# Patient Record
Sex: Female | Born: 1970 | Race: White | Hispanic: No | Marital: Married | Smoking: Never smoker
Health system: Southern US, Community
[De-identification: ages and names within clinical notes are randomized; demographics above are authoritative.]

## PROBLEM LIST (undated history)

## (undated) DIAGNOSIS — O210 Mild hyperemesis gravidarum: Secondary | ICD-10-CM

## (undated) DIAGNOSIS — K6389 Other specified diseases of intestine: Secondary | ICD-10-CM

## (undated) DIAGNOSIS — J45909 Unspecified asthma, uncomplicated: Secondary | ICD-10-CM

## (undated) DIAGNOSIS — D649 Anemia, unspecified: Secondary | ICD-10-CM

## (undated) DIAGNOSIS — D229 Melanocytic nevi, unspecified: Secondary | ICD-10-CM

## (undated) DIAGNOSIS — G8929 Other chronic pain: Secondary | ICD-10-CM

## (undated) DIAGNOSIS — R1011 Right upper quadrant pain: Secondary | ICD-10-CM

## (undated) DIAGNOSIS — Z87442 Personal history of urinary calculi: Secondary | ICD-10-CM

## (undated) HISTORY — DX: Melanocytic nevi, unspecified: D22.9

## (undated) HISTORY — DX: Right upper quadrant pain: R10.11

## (undated) HISTORY — PX: KIDNEY STONE SURGERY: SHX686

## (undated) HISTORY — DX: Mild hyperemesis gravidarum: O21.0

## (undated) HISTORY — DX: Other specified diseases of intestine: K63.89

## (undated) HISTORY — DX: Other chronic pain: G89.29

## (undated) HISTORY — PX: ABDOMINAL HYSTERECTOMY: SHX81

## (undated) HISTORY — PX: TONSILECTOMY, ADENOIDECTOMY, BILATERAL MYRINGOTOMY AND TUBES: SHX2538

## (undated) HISTORY — DX: Unspecified asthma, uncomplicated: J45.909

---

## 1994-06-12 HISTORY — PX: CHOLECYSTECTOMY: SHX55

## 1997-04-13 DIAGNOSIS — O210 Mild hyperemesis gravidarum: Secondary | ICD-10-CM

## 1997-04-13 HISTORY — DX: Mild hyperemesis gravidarum: O21.0

## 1998-06-24 ENCOUNTER — Other Ambulatory Visit: Admission: RE | Admit: 1998-06-24 | Discharge: 1998-06-24 | Payer: Self-pay

## 2001-06-20 ENCOUNTER — Other Ambulatory Visit: Admission: RE | Admit: 2001-06-20 | Discharge: 2001-06-20 | Payer: Self-pay | Admitting: Family Medicine

## 2001-07-09 ENCOUNTER — Ambulatory Visit (HOSPITAL_COMMUNITY): Admission: RE | Admit: 2001-07-09 | Discharge: 2001-07-09 | Payer: Self-pay | Admitting: Urology

## 2001-07-09 ENCOUNTER — Encounter: Payer: Self-pay | Admitting: Urology

## 2002-07-04 ENCOUNTER — Other Ambulatory Visit: Admission: RE | Admit: 2002-07-04 | Discharge: 2002-07-04 | Payer: Self-pay | Admitting: Family Medicine

## 2003-07-10 ENCOUNTER — Other Ambulatory Visit: Admission: RE | Admit: 2003-07-10 | Discharge: 2003-07-10 | Payer: Self-pay | Admitting: Family Medicine

## 2004-03-04 ENCOUNTER — Ambulatory Visit: Payer: Self-pay | Admitting: Family Medicine

## 2004-07-15 ENCOUNTER — Other Ambulatory Visit: Admission: RE | Admit: 2004-07-15 | Discharge: 2004-07-15 | Payer: Self-pay | Admitting: Family Medicine

## 2004-07-15 ENCOUNTER — Ambulatory Visit: Payer: Self-pay | Admitting: Family Medicine

## 2005-07-07 ENCOUNTER — Ambulatory Visit: Payer: Self-pay | Admitting: Family Medicine

## 2005-07-12 ENCOUNTER — Encounter: Payer: Self-pay | Admitting: Family Medicine

## 2005-07-12 LAB — CONVERTED CEMR LAB

## 2005-07-21 ENCOUNTER — Ambulatory Visit: Payer: Self-pay | Admitting: Family Medicine

## 2005-07-21 ENCOUNTER — Other Ambulatory Visit: Admission: RE | Admit: 2005-07-21 | Discharge: 2005-07-21 | Payer: Self-pay | Admitting: Family Medicine

## 2005-07-21 ENCOUNTER — Encounter: Payer: Self-pay | Admitting: Family Medicine

## 2006-02-17 ENCOUNTER — Ambulatory Visit: Payer: Self-pay | Admitting: Family Medicine

## 2006-02-19 ENCOUNTER — Ambulatory Visit: Payer: Self-pay | Admitting: Urology

## 2006-03-08 ENCOUNTER — Ambulatory Visit: Payer: Self-pay | Admitting: Urology

## 2006-03-13 HISTORY — PX: LITHOTRIPSY: SUR834

## 2006-03-25 ENCOUNTER — Ambulatory Visit: Payer: Self-pay | Admitting: Urology

## 2006-05-11 ENCOUNTER — Ambulatory Visit: Payer: Self-pay | Admitting: Family Medicine

## 2006-08-20 ENCOUNTER — Encounter: Payer: Self-pay | Admitting: Family Medicine

## 2006-08-20 DIAGNOSIS — D239 Other benign neoplasm of skin, unspecified: Secondary | ICD-10-CM | POA: Insufficient documentation

## 2006-11-30 ENCOUNTER — Ambulatory Visit: Payer: Self-pay | Admitting: Family Medicine

## 2006-11-30 ENCOUNTER — Encounter: Payer: Self-pay | Admitting: Family Medicine

## 2006-11-30 ENCOUNTER — Other Ambulatory Visit: Admission: RE | Admit: 2006-11-30 | Discharge: 2006-11-30 | Payer: Self-pay | Admitting: Family Medicine

## 2006-12-05 ENCOUNTER — Encounter (INDEPENDENT_AMBULATORY_CARE_PROVIDER_SITE_OTHER): Payer: Self-pay | Admitting: *Deleted

## 2006-12-05 LAB — CONVERTED CEMR LAB: Pap Smear: NORMAL

## 2006-12-06 ENCOUNTER — Encounter (INDEPENDENT_AMBULATORY_CARE_PROVIDER_SITE_OTHER): Payer: Self-pay | Admitting: *Deleted

## 2007-02-05 DIAGNOSIS — J019 Acute sinusitis, unspecified: Secondary | ICD-10-CM | POA: Insufficient documentation

## 2007-02-24 ENCOUNTER — Ambulatory Visit: Payer: Self-pay | Admitting: Internal Medicine

## 2007-02-28 ENCOUNTER — Telehealth: Payer: Self-pay | Admitting: Family Medicine

## 2007-03-17 ENCOUNTER — Telehealth (INDEPENDENT_AMBULATORY_CARE_PROVIDER_SITE_OTHER): Payer: Self-pay | Admitting: *Deleted

## 2007-04-05 ENCOUNTER — Ambulatory Visit: Payer: Self-pay | Admitting: Family Medicine

## 2007-04-05 DIAGNOSIS — N760 Acute vaginitis: Secondary | ICD-10-CM | POA: Insufficient documentation

## 2007-04-05 DIAGNOSIS — B9689 Other specified bacterial agents as the cause of diseases classified elsewhere: Secondary | ICD-10-CM | POA: Insufficient documentation

## 2007-05-16 ENCOUNTER — Encounter: Payer: Self-pay | Admitting: Family Medicine

## 2007-06-02 ENCOUNTER — Ambulatory Visit: Payer: Self-pay | Admitting: Internal Medicine

## 2007-09-13 ENCOUNTER — Ambulatory Visit: Payer: Self-pay | Admitting: Family Medicine

## 2007-09-13 DIAGNOSIS — R519 Headache, unspecified: Secondary | ICD-10-CM | POA: Insufficient documentation

## 2007-09-13 DIAGNOSIS — R4589 Other symptoms and signs involving emotional state: Secondary | ICD-10-CM | POA: Insufficient documentation

## 2007-09-13 DIAGNOSIS — R51 Headache: Secondary | ICD-10-CM | POA: Insufficient documentation

## 2007-09-13 DIAGNOSIS — D485 Neoplasm of uncertain behavior of skin: Secondary | ICD-10-CM | POA: Insufficient documentation

## 2007-10-20 ENCOUNTER — Encounter: Payer: Self-pay | Admitting: Family Medicine

## 2007-11-04 ENCOUNTER — Ambulatory Visit: Payer: Self-pay | Admitting: Family Medicine

## 2007-11-04 LAB — CONVERTED CEMR LAB: Rapid Strep: NEGATIVE

## 2007-12-08 ENCOUNTER — Other Ambulatory Visit: Admission: RE | Admit: 2007-12-08 | Discharge: 2007-12-08 | Payer: Self-pay | Admitting: Family Medicine

## 2007-12-08 ENCOUNTER — Ambulatory Visit: Payer: Self-pay | Admitting: Family Medicine

## 2007-12-08 ENCOUNTER — Encounter: Payer: Self-pay | Admitting: Family Medicine

## 2007-12-12 LAB — CONVERTED CEMR LAB
ALT: 16 units/L (ref 0–35)
AST: 25 units/L (ref 0–37)
Albumin: 4.2 g/dL (ref 3.5–5.2)
Alkaline Phosphatase: 54 units/L (ref 39–117)
BUN: 10 mg/dL (ref 6–23)
Basophils Absolute: 0 10*3/uL (ref 0.0–0.1)
Basophils Relative: 0.5 % (ref 0.0–3.0)
Bilirubin, Direct: 0.1 mg/dL (ref 0.0–0.3)
CO2: 30 meq/L (ref 19–32)
Calcium: 9.6 mg/dL (ref 8.4–10.5)
Chloride: 107 meq/L (ref 96–112)
Cholesterol: 137 mg/dL (ref 0–200)
Creatinine, Ser: 0.8 mg/dL (ref 0.4–1.2)
Eosinophils Absolute: 0.1 10*3/uL (ref 0.0–0.7)
Eosinophils Relative: 1.9 % (ref 0.0–5.0)
GFR calc Af Amer: 106 mL/min
GFR calc non Af Amer: 87 mL/min
Glucose, Bld: 68 mg/dL — ABNORMAL LOW (ref 70–99)
HCT: 38.4 % (ref 36.0–46.0)
HDL: 31.5 mg/dL — ABNORMAL LOW (ref >39.0)
Hemoglobin: 13.5 g/dL (ref 12.0–15.0)
LDL Cholesterol: 90 mg/dL (ref 0–99)
Lymphocytes Relative: 31 % (ref 12.0–46.0)
MCHC: 35.3 g/dL (ref 30.0–36.0)
MCV: 88.7 fL (ref 78.0–100.0)
Monocytes Absolute: 0.5 10*3/uL (ref 0.1–1.0)
Monocytes Relative: 6.9 % (ref 3.0–12.0)
Neutro Abs: 4.2 10*3/uL (ref 1.4–7.7)
Neutrophils Relative %: 59.7 % (ref 43.0–77.0)
Platelets: 255 10*3/uL (ref 150–400)
Potassium: 4.4 meq/L (ref 3.5–5.1)
RBC: 4.33 M/uL (ref 3.87–5.11)
RDW: 13.5 % (ref 11.5–14.6)
Sodium: 142 meq/L (ref 135–145)
TSH: 0.75 microintl units/mL (ref 0.35–5.50)
Total Bilirubin: 0.9 mg/dL (ref 0.3–1.2)
Total CHOL/HDL Ratio: 4.3
Total Protein: 7.2 g/dL (ref 6.0–8.3)
Triglycerides: 76 mg/dL (ref 0–149)
VLDL: 15 mg/dL (ref 0–40)
WBC: 6.9 10*3/uL (ref 4.5–10.5)

## 2008-05-31 ENCOUNTER — Ambulatory Visit: Payer: Self-pay | Admitting: Family Medicine

## 2008-05-31 ENCOUNTER — Encounter (INDEPENDENT_AMBULATORY_CARE_PROVIDER_SITE_OTHER): Payer: Self-pay | Admitting: Internal Medicine

## 2008-05-31 DIAGNOSIS — R9431 Abnormal electrocardiogram [ECG] [EKG]: Secondary | ICD-10-CM | POA: Insufficient documentation

## 2008-05-31 DIAGNOSIS — N644 Mastodynia: Secondary | ICD-10-CM | POA: Insufficient documentation

## 2008-06-07 ENCOUNTER — Ambulatory Visit: Payer: Self-pay | Admitting: Family Medicine

## 2008-07-18 ENCOUNTER — Ambulatory Visit: Payer: Self-pay | Admitting: Family Medicine

## 2008-07-18 DIAGNOSIS — H60399 Other infective otitis externa, unspecified ear: Secondary | ICD-10-CM | POA: Insufficient documentation

## 2008-07-18 DIAGNOSIS — R0789 Other chest pain: Secondary | ICD-10-CM | POA: Insufficient documentation

## 2008-07-30 ENCOUNTER — Ambulatory Visit: Payer: Self-pay | Admitting: Cardiology

## 2008-08-13 ENCOUNTER — Ambulatory Visit: Payer: Self-pay | Admitting: Cardiology

## 2008-12-07 ENCOUNTER — Encounter: Payer: Self-pay | Admitting: Family Medicine

## 2008-12-07 ENCOUNTER — Other Ambulatory Visit: Admission: RE | Admit: 2008-12-07 | Discharge: 2008-12-07 | Payer: Self-pay | Admitting: Family Medicine

## 2008-12-07 ENCOUNTER — Ambulatory Visit: Payer: Self-pay | Admitting: Family Medicine

## 2008-12-12 ENCOUNTER — Encounter (INDEPENDENT_AMBULATORY_CARE_PROVIDER_SITE_OTHER): Payer: Self-pay | Admitting: *Deleted

## 2009-01-07 ENCOUNTER — Encounter: Payer: Self-pay | Admitting: Family Medicine

## 2009-01-07 ENCOUNTER — Ambulatory Visit: Payer: Self-pay | Admitting: Family Medicine

## 2009-01-14 DIAGNOSIS — R928 Other abnormal and inconclusive findings on diagnostic imaging of breast: Secondary | ICD-10-CM | POA: Insufficient documentation

## 2009-01-22 ENCOUNTER — Encounter: Payer: Self-pay | Admitting: Family Medicine

## 2009-01-22 ENCOUNTER — Ambulatory Visit: Payer: Self-pay | Admitting: Family Medicine

## 2009-01-28 ENCOUNTER — Encounter (INDEPENDENT_AMBULATORY_CARE_PROVIDER_SITE_OTHER): Payer: Self-pay | Admitting: *Deleted

## 2009-04-01 ENCOUNTER — Telehealth: Payer: Self-pay | Admitting: Family Medicine

## 2009-04-01 ENCOUNTER — Ambulatory Visit: Payer: Self-pay | Admitting: Family Medicine

## 2009-05-01 ENCOUNTER — Ambulatory Visit: Payer: Self-pay | Admitting: Family Medicine

## 2009-05-01 DIAGNOSIS — J069 Acute upper respiratory infection, unspecified: Secondary | ICD-10-CM | POA: Insufficient documentation

## 2009-05-01 LAB — CONVERTED CEMR LAB: Rapid Strep: NEGATIVE

## 2009-11-25 ENCOUNTER — Telehealth: Payer: Self-pay | Admitting: Family Medicine

## 2009-12-09 ENCOUNTER — Telehealth: Payer: Self-pay | Admitting: Family Medicine

## 2009-12-12 ENCOUNTER — Telehealth (INDEPENDENT_AMBULATORY_CARE_PROVIDER_SITE_OTHER): Payer: Self-pay | Admitting: *Deleted

## 2009-12-17 ENCOUNTER — Ambulatory Visit: Payer: Self-pay | Admitting: Family Medicine

## 2009-12-17 LAB — CONVERTED CEMR LAB
ALT: 29 units/L (ref 0–35)
AST: 30 units/L (ref 0–37)
Albumin: 4.1 g/dL (ref 3.5–5.2)
Alkaline Phosphatase: 64 units/L (ref 39–117)
BUN: 11 mg/dL (ref 6–23)
Basophils Absolute: 0 10*3/uL (ref 0.0–0.1)
Basophils Relative: 0.6 % (ref 0.0–3.0)
Bilirubin, Direct: 0.2 mg/dL (ref 0.0–0.3)
CO2: 27 meq/L (ref 19–32)
Calcium: 9.1 mg/dL (ref 8.4–10.5)
Chloride: 108 meq/L (ref 96–112)
Cholesterol: 146 mg/dL (ref 0–200)
Creatinine, Ser: 0.8 mg/dL (ref 0.4–1.2)
Eosinophils Absolute: 0.1 10*3/uL (ref 0.0–0.7)
Eosinophils Relative: 1.3 % (ref 0.0–5.0)
GFR calc non Af Amer: 91.15 mL/min (ref >60)
Glucose, Bld: 83 mg/dL (ref 70–99)
HCT: 36.1 % (ref 36.0–46.0)
HDL: 30.4 mg/dL — ABNORMAL LOW (ref >39.00)
Hemoglobin: 12.1 g/dL (ref 12.0–15.0)
LDL Cholesterol: 105 mg/dL — ABNORMAL HIGH (ref 0–99)
Lymphocytes Relative: 32.9 % (ref 12.0–46.0)
Lymphs Abs: 2.1 10*3/uL (ref 0.7–4.0)
MCHC: 33.7 g/dL (ref 30.0–36.0)
MCV: 85.4 fL (ref 78.0–100.0)
Monocytes Absolute: 0.5 10*3/uL (ref 0.1–1.0)
Monocytes Relative: 7.8 % (ref 3.0–12.0)
Neutro Abs: 3.6 10*3/uL (ref 1.4–7.7)
Neutrophils Relative %: 57.4 % (ref 43.0–77.0)
Platelets: 252 10*3/uL (ref 150.0–400.0)
Potassium: 4.3 meq/L (ref 3.5–5.1)
RBC: 4.23 M/uL (ref 3.87–5.11)
RDW: 15.3 % — ABNORMAL HIGH (ref 11.5–14.6)
Sodium: 141 meq/L (ref 135–145)
TSH: 1.09 microintl units/mL (ref 0.35–5.50)
Total Bilirubin: 1.1 mg/dL (ref 0.3–1.2)
Total CHOL/HDL Ratio: 5
Total Protein: 6.9 g/dL (ref 6.0–8.3)
Triglycerides: 53 mg/dL (ref 0.0–149.0)
VLDL: 10.6 mg/dL (ref 0.0–40.0)
WBC: 6.3 10*3/uL (ref 4.5–10.5)

## 2009-12-27 ENCOUNTER — Other Ambulatory Visit: Admission: RE | Admit: 2009-12-27 | Discharge: 2009-12-27 | Payer: Self-pay | Admitting: Family Medicine

## 2009-12-27 ENCOUNTER — Ambulatory Visit: Payer: Self-pay | Admitting: Family Medicine

## 2009-12-27 LAB — HM PAP SMEAR

## 2010-01-03 ENCOUNTER — Encounter: Payer: Self-pay | Admitting: Family Medicine

## 2010-01-03 LAB — CONVERTED CEMR LAB: Pap Smear: NEGATIVE

## 2010-03-27 ENCOUNTER — Ambulatory Visit: Payer: Self-pay | Admitting: Family Medicine

## 2010-03-27 DIAGNOSIS — M545 Low back pain, unspecified: Secondary | ICD-10-CM | POA: Insufficient documentation

## 2010-03-27 DIAGNOSIS — N209 Urinary calculus, unspecified: Secondary | ICD-10-CM | POA: Insufficient documentation

## 2010-03-27 LAB — CONVERTED CEMR LAB
Bilirubin Urine: NEGATIVE
Ketones, urine, test strip: NEGATIVE
Urobilinogen, UA: 0.2

## 2010-03-28 ENCOUNTER — Encounter: Payer: Self-pay | Admitting: Family Medicine

## 2010-03-31 ENCOUNTER — Encounter: Payer: Self-pay | Admitting: Family Medicine

## 2010-03-31 ENCOUNTER — Ambulatory Visit: Payer: Self-pay | Admitting: Family Medicine

## 2010-03-31 ENCOUNTER — Ambulatory Visit: Payer: Self-pay | Admitting: Internal Medicine

## 2010-03-31 ENCOUNTER — Telehealth (INDEPENDENT_AMBULATORY_CARE_PROVIDER_SITE_OTHER): Payer: Self-pay | Admitting: *Deleted

## 2010-03-31 LAB — CONVERTED CEMR LAB: Preg, Serum: NEGATIVE

## 2010-04-01 ENCOUNTER — Telehealth: Payer: Self-pay | Admitting: Family Medicine

## 2010-04-28 ENCOUNTER — Ambulatory Visit
Admission: RE | Admit: 2010-04-28 | Discharge: 2010-04-28 | Payer: Self-pay | Source: Home / Self Care | Attending: Family Medicine | Admitting: Family Medicine

## 2010-05-13 NOTE — Assessment & Plan Note (Signed)
Summary: CPX/CLE   Vital Signs:  Patient profile:   40 year old female Height:      64 inches Weight:      143 pounds BMI:     24.63 Temp:     98.5 degrees F oral Pulse rate:   72 / minute Pulse rhythm:   regular BP sitting:   120 / 70  (left arm) Cuff size:   regular  Vitals Entered By: Lewanda Rife LPN (December 27, 2009 2:40 PM) CC: CPX with pap and breast exam LMP 12/22/09   History of Present Illness: here for wellness exam and gyn care and to disc chronic med problems   stress -- child is 83 - stressful, puberty and middle school  some attitude problems  makes it hard to take care of herself   other than that is tired but feeling ok  some tension headaches - needs a massage (has a giftcard)   wt is up 7 lb  bp is good 120/70  derm status - has not been for a whie  labs look good with lipids Last Lipid ProfileCholesterol: 146 (12/17/2009 10:19:43 AM)HDL:  30.40 (12/17/2009 10:19:43 AM)LDL:  105 (12/17/2009 10:19:43 AM)Triglycerides:  Last Liver profileSGOT:  30 (12/17/2009 10:19:43 AM)SPGT:  29 (12/17/2009 10:19:43 AM)T. Bili:  1.1 (12/17/2009 10:19:43 AM)Alk Phos:  64 (12/17/2009 10:19:43 AM)   8/10 NL PAP -- end menses -- is just bleeding a little bit  menses--pretty regular and no problems   Td 04  is drinking more water   flu shot --wants one today  Allergies: 1)  ! Sulfa 2)  ! Augmentin  Past History:  Past Medical History: Last updated: 09/13/2007 kidney stones abnormal moles removed from her back   Past Surgical History: Last updated: 11/30/2006 Cholecystectomy (06/1994) Tonsillectomy  Myringotomy tubes Kidney stone (07/2001) Hyperemesis gravidarum (1999) lithotripsy 12/07  Family History: Last updated: 12/08/2007 Father:  Mother: migraines  Siblings:  M GM brain tumor/breast ca P aunt breast ca MGF heart problems  Social History: Last updated: 04/01/2009 Marital Status: Married, husband has type 2 diabetes Children: 1  son Occupation: insurance/ very long hours  non smoker- and no smoke in the house   Risk Factors: Caffeine Use: 3 (05/31/2008)  Risk Factors: Smoking Status: never (08/20/2006)  Review of Systems General:  Denies fatigue, loss of appetite, and malaise. Eyes:  Denies blurring and eye irritation. CV:  Denies chest pain or discomfort, lightheadness, palpitations, and shortness of breath with exertion. Resp:  Denies cough, shortness of breath, and wheezing. GI:  Denies abdominal pain, change in bowel habits, indigestion, and nausea. GU:  Denies abnormal vaginal bleeding, discharge, dysuria, and urinary frequency. MS:  Denies muscle aches and cramps. Derm:  Denies itching, lesion(s), poor wound healing, and rash. Neuro:  Denies headaches, numbness, and tingling. Psych:  very stressed but handing that ok- does not want to see counselor yet. Endo:  Denies cold intolerance, excessive thirst, excessive urination, and heat intolerance. Heme:  Denies abnormal bruising and bleeding.  Physical Exam  General:  Well-developed,well-nourished,in no acute distress; alert,appropriate and cooperative throughout examination Head:  normocephalic, atraumatic, and no abnormalities observed.   Eyes:  vision grossly intact, pupils equal, pupils round, and pupils reactive to light.  no conjunctival pallor, injection or icterus  Ears:  R ear normal and L ear normal.   Nose:  no nasal discharge.   Mouth:  pharynx pink and moist.   Neck:  supple with full rom and no masses or thyromegally, no  JVD or carotid bruit  Chest Wall:  No deformities, masses, or tenderness noted. Breasts:  No mass, nodules, thickening, tenderness, bulging, retraction, inflamation, nipple discharge or skin changes noted.   Lungs:  Normal respiratory effort, chest expands symmetrically. Lungs are clear to auscultation, no crackles or wheezes. Heart:  Normal rate and regular rhythm. S1 and S2 normal without gallop, murmur, click, rub or  other extra sounds. Abdomen:  Bowel sounds positive,abdomen soft and non-tender without masses, organomegaly or hernias noted. no renal bruits  Genitalia:  Normal introitus for age, no external lesions, no vaginal discharge, mucosa pink and moist, no vaginal or cervical lesions, no vaginal atrophy, no friaility or hemorrhage, normal uterus size and position, no adnexal masses or tenderness Msk:  No deformity or scoliosis noted of thoracic or lumbar spine.  no acute joint changes Pulses:  R and L carotid,radial,femoral,dorsalis pedis and posterior tibial pulses are full and equal bilaterally Extremities:  No clubbing, cyanosis, edema, or deformity noted with normal full range of motion of all joints.   Neurologic:  sensation intact to light touch, gait normal, and DTRs symmetrical and normal.   Skin:  Intact without suspicious lesions or rashes brown benign appearing nevi on back  Cervical Nodes:  No lymphadenopathy noted Axillary Nodes:  No palpable lymphadenopathy Inguinal Nodes:  No significant adenopathy Psych:  normal affect, talkative and pleasant  does get tearful at times when disc her stressors    Impression & Recommendations:  Problem # 1:  HEALTH MAINTENANCE EXAM (ICD-V70.0) Assessment Comment Only reviewed health habits including diet, exercise and skin cancer prevention reviewed health maintenance list and family history rev labs disc ways to inc HDL flu shot   Problem # 2:  GYNECOLOGICAL EXAMINATION, ROUTINE (ICD-V72.31) Assessment: Comment Only annual exam with pap end of menses now no problems   Problem # 3:  STRESS REACTION, ACUTE, WITH EMOTIONAL DISTURBANCE (ICD-308.0) Assessment: Comment Only disc stressors in length overall good coping skills and communication/ support will update me if things get worse  Complete Medication List: 1)  Imitrex 50 Mg Tabs (Sumatriptan succinate) .Marland Kitchen.. 1 by mouth times one for headache  can repeat dose once in 2 hours if headache  is not gone as needed 2)  Flexeril 10 Mg Tabs (Cyclobenzaprine hcl) .Marland Kitchen.. 1 by mouth at bedtime as needed headache or muscle spasm 3)  Tessalon 200 Mg Caps (Benzonatate) .Marland Kitchen.. 1 by mouth up to three times a day as needed cough-- swallow whole 4)  Ovide 0.5 % Lotn (Malathion) .... Apply to dry hair and massage in -- leave for 8-12 hours then shampoo out, can repeat in 7-9 days  Other Orders: Admin 1st Vaccine (25956) Flu Vaccine 62yrs + (38756)  Patient Instructions: 1)  make sure to see dermatology for skin exam this year  2)  start fish oil 1000 mg daily- and you can increase gradually to 3000 mg daily for good cholesterol (exercise helps too) 3)  let me know if stress worsens / if you are having more trouble with it   Current Allergies (reviewed today): ! SULFA ! AUGMENTIN     Flu Vaccine Consent Questions     Do you have a history of severe allergic reactions to this vaccine? no    Any prior history of allergic reactions to egg and/or gelatin? no    Do you have a sensitivity to the preservative Thimersol? no    Do you have a past history of Guillan-Barre Syndrome? no    Do you  currently have an acute febrile illness? no    Have you ever had a severe reaction to latex? no    Vaccine information given and explained to patient? yes    Are you currently pregnant? no    Lot Number:[id number]   Exp Date:10/11/2010   Site Given  Right Deltoid IMlbflu Lewanda Rife LPN  December 27, 2009 5:06 PM

## 2010-05-13 NOTE — Progress Notes (Signed)
Summary: needs something for head lice  Phone Note Call from Patient Call back at [phone]   Caller: Patient Call For: Judith Part MD Summary of Call: Pt is asking if she can have something for head lice [facility] [place].  Her children have this and now she does. Initial call taken by: Lowella Petties CMA,  November 25, 2009 12:36 PM  Follow-up for Phone Call        I do believe that permethrin (trade names include NIX) - formulations are now avail otc without a px -- several diff brands  if any problems let me know-- that is what I recommend to use    Follow-up by: Judith Part MD,  November 25, 2009 1:32 PM  Additional Follow-up for Phone Call Additional follow up Details #1::        Spoke with patient and notified her to try the NIX. She will try and call back if it doesn't work. She was concerned because it has developed into actual bugs and wasn't sure if OTC would be strong enough. I advised her to call back if it didn't take care of the problem. Additional Follow-up by: Janee Morn CMA Duncan Dull),  November 25, 2009 2:11 PM

## 2010-05-13 NOTE — Letter (Signed)
Summary: Out of Work  [facility] [employer] [facility] at [facility]  [address] [place], [place] [address]   Phone: [phone]  Fax: [phone]    May 01, 2009   Employee:  LUANA TATRO Coca    To Whom It May Concern:   For Medical reasons, please excuse the above named employee from work for the following dates:  Start:   05/01/2009 for acute illness   End:   05/02/2009 if she is feeling better   If you need additional information, please feel free to contact our office.         Sincerely,    Judith Part MD

## 2010-05-13 NOTE — Assessment & Plan Note (Signed)
Summary: ST,COUGH/CLE   Vital Signs:  Patient profile:   40 year old female Weight:      136 pounds Temp:     98.3 degrees F oral Pulse rate:   72 / minute Pulse rhythm:   regular BP sitting:   110 / 70  (left arm) Cuff size:   regular  Vitals Entered By: Lowella Petties CMA (May 01, 2009 12:36 PM) CC: Cough , sore throat, chest is hurting, achy.   History of Present Illness: started getting sick over weekend  st and hoarseness 99.4 fever mon night  congestion and some cough  really exhausted - and more achey this am   nasal drainage is clear with a bit of yellow some cough - is mainly dry -- can feel congestion rattling around   took some tylenol and cough drops   no n/vd  Allergies: 1)  ! Sulfa 2)  ! Augmentin  Review of Systems General:  Complains of chills, fatigue, fever, loss of appetite, and malaise. Eyes:  Denies discharge and eye irritation. ENT:  Complains of nasal congestion, postnasal drainage, and sore throat; denies ear discharge, earache, and sinus pressure. CV:  Denies chest pain or discomfort and palpitations. Resp:  Complains of cough; denies pleuritic, shortness of breath, sputum productive, and wheezing. GI:  Denies abdominal pain, nausea, and vomiting. Derm:  Denies rash.  Physical Exam  General:  Well-developed,well-nourished,in no acute distress; alert,appropriate and cooperative throughout examination Head:  normocephalic, atraumatic, and no abnormalities observed.  no sinus tenderness  Eyes:  vision grossly intact, pupils equal, pupils round, pupils reactive to light, and no injection.   Ears:  R ear normal and L ear normal.   Nose:  nares are injected and congested with clear rhinorrhea  Mouth:  pharynx pink and moist.  -- clear post nasal drip Neck:  No deformities, masses, or tenderness noted. Lungs:  Normal respiratory effort, chest expands symmetrically. Lungs are clear to auscultation, no crackles or wheezes. Heart:  Normal rate  and regular rhythm. S1 and S2 normal without gallop, murmur, click, rub or other extra sounds. Skin:  Intact without suspicious lesions or rashes Cervical Nodes:  No lymphadenopathy noted Psych:  normal affect, talkative and pleasant    Impression & Recommendations:  Problem # 1:  URI (ICD-465.9) Assessment New  with st and congestion/ cough and rapid strep neg  suspect viral recommend sympt care- see pt instructions  - given tessalon for cough  pt advised to update me if symptoms worsen or do not improve - esp if fever or worse cough Her updated medication list for this problem includes:    Tessalon 200 Mg Caps (Benzonatate) .Marland Kitchen... 1 by mouth up to three times a day as needed cough-- swallow whole  Orders: Rapid Strep (62952)  Complete Medication List: 1)  Imitrex 50 Mg Tabs (Sumatriptan succinate) .Marland Kitchen.. 1 by mouth times one for headache  can repeat dose once in 2 hours if headache is not gone 2)  Flexeril 10 Mg Tabs (Cyclobenzaprine hcl) .Marland Kitchen.. 1 by mouth at bedtime as needed headache or muscle spasm 3)  Tessalon 200 Mg Caps (Benzonatate) .Marland Kitchen.. 1 by mouth up to three times a day as needed cough-- swallow whole  Patient Instructions: 1)  you can try mucinex over the counter twice daily as directed and nasal saline spray for congestion 2)  tylenol over the counter as directed may help with aches, headache and fever 3)  call if symptoms worsen or if not improved in  4-5 days  4)  try the tessalon for cough  Prescriptions: TESSALON 200 MG CAPS (BENZONATATE) 1 by mouth up to three times a day as needed cough-- swallow whole  #30 x 0   Entered and Authorized by:   Judith Part MD   Signed by:   Judith Part MD on 05/01/2009   Method used:   Print then Give to Patient   RxID:   [id number]   Prior Medications (reviewed today): IMITREX 50 MG TABS (SUMATRIPTAN SUCCINATE) 1 by mouth times one for headache  can repeat dose once in 2 hours if headache is not gone FLEXERIL 10 MG  TABS (CYCLOBENZAPRINE HCL) 1 by mouth at bedtime as needed headache or muscle spasm Current Allergies: ! SULFA ! AUGMENTIN    Laboratory Results    Other Tests  Rapid Strep: negative

## 2010-05-13 NOTE — Progress Notes (Signed)
Summary: pt continues to have head lice  Phone Note Call from Patient Call back at [phone]   Caller: Patient Call For: Judith Part MD Summary of Call: Pt states she has been treating herself and her children for head lice for 2 weeks, and she still has them.  Her children have been using prescription meds and they only have nits, pt has been using otc's and continues to see the adults.  She is very frustrated, says she has been spraying everything, has bought new pillows and hair brushes, etc.  Doesnt know what else to do.  [facility] [place] Initial call taken by: Lowella Petties CMA,  December 09, 2009 8:51 AM  Follow-up for Phone Call        it sounds like there may be permethrin resistance I put px on EMR for ovide -- this is a bit different (warn her it does not smell good) go ahead and use as directed -- and repeat in 7-9 days only if lice are not gone I put handout in IN box to mail her on lice as well f/u if not improved  Follow-up by: Judith Part MD,  December 09, 2009 11:48 AM  Additional Follow-up for Phone Call Additional follow up Details #1::        Patient notified as instructed by telephone. Medication phoned to [facility] as instructed. Information mailed to patient as instructed. Lewanda Rife LPN  December 09, 2009 12:43 PM     New/Updated Medications: OVIDE 0.5 % LOTN (MALATHION) apply to dry hair and massage in -- leave for 8-12 hours then shampoo out, can repeat in 7-9 days Prescriptions: OVIDE 0.5 % LOTN (MALATHION) apply to dry hair and massage in -- leave for 8-12 hours then shampoo out, can repeat in 7-9 days  #1 course x 1   Entered and Authorized by:   Judith Part MD   Signed by:   Lewanda Rife LPN on 04/54/0981   Method used:   Telephoned to ...       [facility]ail[facility]       [address]       [place], [place]  [address]       Ph: [phone]       Fax: [phone]   RxID:    [id number]

## 2010-05-13 NOTE — Progress Notes (Signed)
----   Converted from flag ---- ---- 12/12/2009 12:41 PM, Judith Part MD wrote: please check wellness and lipid v70.0 thanks   ---- 12/12/2009 10:31 AM, Liane Comber CMA (AAMA) wrote: Peri Jefferson Morning! This pt is scheduled for cpx labs Tuesday, which labs to draw and dx codes to use? Thanks Tasha ------------------------------

## 2010-05-13 NOTE — Letter (Signed)
Summary: Results Follow up Letter  [place] [facility]  [address] [place], [place] [address]   Phone: [phone]  Fax: [phone]    01/03/2010 MRN: [id number]    [facility] [address] [place], [place]  [address]    Dear Ms. Ferreras,  The following are the results of your recent test(s):  Test         Result    Pap Smear:        Normal _X__  Not Normal _____ Comments: ______________________________________________________ Cholesterol: LDL(Bad cholesterol):         Your goal is less than:         HDL (Good cholesterol):       Your goal is more than: Comments:  ______________________________________________________ Mammogram:        Normal _____  Not Normal _____ Comments:  ___________________________________________________________________ Hemoccult:        Normal _____  Not normal _______ Comments:    _____________________________________________________________________ Other Tests:    We routinely do not discuss normal results over the telephone.  If you desire a copy of the results, or you have any questions about this information we can discuss them at your next office visit.   Sincerely,   Idamae Schuller Tower,MD  MT/ri

## 2010-05-15 NOTE — Assessment & Plan Note (Signed)
Summary: RECHECK PER [facility]/DLO   Vital Signs:  Patient profile:   40 year old female Height:      64 inches Weight:      138.8 pounds BMI:     23.91 Temp:     98.3 degrees F oral Pulse rate:   72 / minute Pulse rhythm:   regular BP sitting:   100 / 70  (left arm) Cuff size:   regular  Vitals Entered By: Benny Lennert CMA Duncan Dull) (March 31, 2010 8:26 AM)  History of Present Illness: 40 year old recheck, presumed kidney stone, with negative culture, continued macroscopic hematuria, flank pain, changed some in position. Has not required narcotics  03/27/2010 OV 40 year old very pleasant female who presents with left-sided lower back and flank pain. She has had some also some nausea  and low back pain over the last several days.  No fever, chills or sweats. Does have pain in her LEFT upper and lower back laterally.  She's keeping some liquids down. She has not had take any significant pain medication thus far. She has not had any gross hematuria. Her urine is tea colored appearance on my examination of it.  Review systems: As above, no chest pain, shortness of breath, or URI symptoms.  GEN: WDWN, NAD, Non-toxic, A & O x 3 HEENT: Atraumatic, Normocephalic. Neck supple. No masses, No LAD. Ears and Nose: No external deformity. CV: RRR, No M/G/R. No JVD. No thrill. No extra heart sounds. PULM: CTA B, no wheezes, crackles, rhonchi. No retractions. No resp. distress. No accessory muscle use. ABD: S, NT, ND, +BS. No rebound tenderness. No HSM. L CVAT EXTR: No c/c/e NEURO: Normal gait.  PSYCH: Normally interactive. Conversant. Not depressed or anxious appearing.  Calm demeanor.     ROS: No fever, chills or sweats. Does have pain in her LEFT upper and lower back laterally. She's keeping some liquids down. She has not had take any significant pain medication thus far. She has not had any gross hematuria. Her urine is tea colored appearance on my examination of it. As above, no chest  pain, shortness of breath, or URI symptoms.  GEN: WDWN, NAD, Non-toxic, A & O x 3 HEENT: Atraumatic, Normocephalic. Neck supple. No masses, No LAD. Ears and Nose: No external deformity. CV: RRR, No M/G/R. No JVD. No thrill. No extra heart sounds. PULM: CTA B, no wheezes, crackles, rhonchi. No retractions. No resp. distress. No accessory muscle use. ABD: S, NT, ND, +BS. No rebound tenderness. No HSM. L CVAT EXTR: No c/c/e NEURO: Normal gait.  PSYCH: Normally interactive. Conversant. Not depressed or anxious appearing.  Calm demeanor.    Allergies: 1)  ! Sulfa 2)  ! Augmentin  Past History:  Past medical, surgical, family and social histories (including risk factors) reviewed, and no changes noted (except as noted below).  Past Medical History: Reviewed history from 09/13/2007 and no changes required. kidney stones abnormal moles removed from her back   Past Surgical History: Reviewed history from 11/30/2006 and no changes required. Cholecystectomy (06/1994) Tonsillectomy  Myringotomy tubes Kidney stone (07/2001) Hyperemesis gravidarum (1999) lithotripsy 12/07  Family History: Reviewed history from 12/08/2007 and no changes required. Father:  Mother: migraines  Siblings:  M GM brain tumor/breast ca P aunt breast ca MGF heart problems  Social History: Reviewed history from 04/01/2009 and no changes required. Marital Status: Married, husband has type 2 diabetes Children: 1 son Occupation: insurance/ very long hours  non smoker- and no smoke in the house  Impression & Recommendations:  Problem # 1:  KIDNEY STONE (ICD-592.9) Assessment Deteriorated h/o nephrolithiasis, likely based on history without improvement. Pushing fluids.  CT, renal protocol without contrast to evaluate size of stone to determine if urological consult necessary, evaluate for hydronephrosis. Stat HCG prior to CT. Call report to me.  push fluids, ABX  Orders: Radiology Referral  (Radiology) T-Pregnancy (Serum), Qual.  [phone]) Venipuncture (09811) Specimen Handling (91478)  Problem # 2:  LOW BACK PAIN, ACUTE (ICD-724.2) Assessment: Unchanged  Her updated medication list for this problem includes:    Hydrocodone-acetaminophen 5-500 Mg Tabs (Hydrocodone-acetaminophen) .Marland Kitchen... 1 by mouth q 6 hours as needed pain  Complete Medication List: 1)  Hydrocodone-acetaminophen 5-500 Mg Tabs (Hydrocodone-acetaminophen) .Marland Kitchen.. 1 by mouth q 6 hours as needed pain 2)  Ciprofloxacin Hcl 250 Mg Tabs (Ciprofloxacin hcl) .Marland Kitchen.. 1 by mouth two times a day  Patient Instructions: 1)  Referral Appointment Information 2)  Day/Date: 3)  Time: 4)  Place/MD: 5)  Address: 6)  Phone/Fax: 7)  Patient given appointment information. Information/Orders faxed/mailed.    Orders Added: 1)  Radiology Referral [Radiology] 2)  T-Pregnancy (Serum), Qual.  [[id number]] 3)  Venipuncture [29562] 4)  Specimen Handling [99000] 5)  Est. Patient Level IV [13086]    Current Allergies (reviewed today): ! SULFA ! AUGMENTIN

## 2010-05-15 NOTE — Progress Notes (Signed)
Summary: preg test neg   Phone Note Call from Patient   Caller: Patient Call For: Dr. Patsy Lager Summary of Call: Thelma Barge with [facility]- STAT pregnancy test was neg.  Initial call taken by: Melody Comas,  March 31, 2010 10:25 AM  Follow-up for Phone Call        noted, let patient know and can proceed to [facility] MD  March 31, 2010 10:36 AM   Additional Follow-up for Phone Call Additional follow up Details #1::        Patient advised via message on machine.Consuello Masse CMA   Additional Follow-up by: Benny Lennert CMA Duncan Dull),  March 31, 2010 10:45 AM

## 2010-05-15 NOTE — Assessment & Plan Note (Signed)
Summary: RIGHT EAR PAIN,HA/CLE   Vital Signs:  Patient profile:   40 year old female Height:      64 inches Weight:      142 pounds BMI:     24.46 Temp:     98.5 degrees F oral Pulse rate:   68 / minute Pulse rhythm:   regular BP sitting:   102 / 76  (left arm) Cuff size:   regular  Vitals Entered By: Delilah Shan CMA  Dull) (April 28, 2010 4:17 PM) CC: Right ear pain, headache   History of Present Illness: R ear pain.  HA, on R side.  Sx going on for a few days.  No FCNAVD.   Occ cough.  No sick contacts.  Some rhinorrhea.   Allergies: 1)  ! Sulfa 2)  ! 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  Otherwise negative.    Physical Exam  General:  GEN: nad, alert and oriented HEENT: mucous membranes moist, TM w/o erythema, nasal epithelium injected, OP with cobblestoning, R SOM noted NECK: supple w/o LA CV: rrr. PULM: ctab, no inc wob ABD: soft, +bs EXT: no edema    Impression & Recommendations:  Problem # 1:  URI (ICD-465.9) Nontoxic, likely viral.  Supporitve tx.  No indication for antibiotics.  follow up as needed.  She agrees.  Rest and fluids in meantime.    Patient Instructions: 1)  Get plenty of rest, drink lots of clear liquids, and use Tylenol for fever and comfort. Let us know if you aren't getting better by next week.  Take care.    Orders Added: 1)  Est. Patient Level III [56314]    Current Allergies (reviewed today): ! SULFA ! AUGMENTIN

## 2010-05-15 NOTE — Assessment & Plan Note (Signed)
Summary: lower back pain/alc   Vital Signs:  Patient profile:   40 year old female Height:      64 inches Weight:      141.4 pounds BMI:     24.36 Temp:     98.8 degrees F oral Pulse rate:   72 / minute Pulse rhythm:   regular BP sitting:   90 / 60  (left arm) Cuff size:   regular  Vitals Entered By: Benny Lennert CMA Duncan Dull) (March 27, 2010 3:49 PM)  History of Present Illness: Chief complaint left lower back pain  40 year old very pleasant female who presents with left-sided lower back and flank pain. She has had some also some nausea  and low back pain over the last several days.   History significant for prior kidney stone.  No fever, chills or sweats. Does have pain in her LEFT upper and lower back laterally.  She's keeping some liquids down. She has not had take any significant pain medication thus far. She has not had any gross hematuria. Her urine is tea colored appearance on my examination of it.  Review systems: As above, no chest pain, shortness of breath, or URI symptoms.  GEN: WDWN, NAD, Non-toxic, A & O x 3 HEENT: Atraumatic, Normocephalic. Neck supple. No masses, No LAD. Ears and Nose: No external deformity. CV: RRR, No M/G/R. No JVD. No thrill. No extra heart sounds. PULM: CTA B, no wheezes, crackles, rhonchi. No retractions. No resp. distress. No accessory muscle use. ABD: S, NT, ND, +BS. No rebound tenderness. No HSM. L CVAT EXTR: No c/c/e NEURO: Normal gait.  PSYCH: Normally interactive. Conversant. Not depressed or anxious appearing.  Calm demeanor.    Allergies: 1)  ! Sulfa 2)  ! Augmentin  Past History:  Past medical, surgical, family and social histories (including risk factors) reviewed, and no changes noted (except as noted below).  Past Medical History: Reviewed history from 09/13/2007 and no changes required. kidney stones abnormal moles removed from her back   Past Surgical History: Reviewed history from 11/30/2006 and no changes  required. Cholecystectomy (06/1994) Tonsillectomy  Myringotomy tubes Kidney stone (07/2001) Hyperemesis gravidarum (1999) lithotripsy 12/07  Family History: Reviewed history from 12/08/2007 and no changes required. Father:  Mother: migraines  Siblings:  M GM brain tumor/breast ca P aunt breast ca MGF heart problems  Social History: Reviewed history from 04/01/2009 and no changes required. Marital Status: Married, husband has type 2 diabetes Children: 1 son Occupation: insurance/ very long hours  non smoker- and no smoke in the house    Impression & Recommendations:  Problem # 1:  LOW BACK PAIN, ACUTE (ICD-724.2) Assessment New blood and urine, probable kidney stone. Treat with flushing  with fluids,, antibiotics, and pain medications as needed.  Note allergy to sulfa, so Flomax is contraindicated.  if symptoms not resolved on Monday, would  obtain a renal protocol CT to evaluate for renal stone and potential hydronephrosis.  The following medications were removed from the medication list:    Flexeril 10 Mg Tabs (Cyclobenzaprine hcl) .Marland Kitchen... 1 by mouth at bedtime as needed headache or muscle spasm Her updated medication list for this problem includes:    Hydrocodone-acetaminophen 5-500 Mg Tabs (Hydrocodone-acetaminophen) .Marland Kitchen... 1 by mouth q 6 hours as needed pain  Orders: UA Dipstick W/ Micro (manual) (40981) T-Culture, Urine ([id number]) Specimen Handling (99000)  Problem # 2:  KIDNEY STONE (ICD-592.9) Assessment: New  Complete Medication List: 1)  Hydrocodone-acetaminophen 5-500 Mg Tabs (Hydrocodone-acetaminophen) .Marland Kitchen.. 1 by mouth  q 6 hours as needed pain 2)  Ciprofloxacin Hcl 250 Mg Tabs (Ciprofloxacin hcl) .Marland Kitchen.. 1 by mouth two times a day  Patient Instructions: 1)  recheck with Dr. Milinda Antis (or me OK if she is full - on Monday) Prescriptions: CIPROFLOXACIN HCL 250 MG TABS (CIPROFLOXACIN HCL) 1 by mouth two times a day  #20 x 0   Entered and Authorized by:   Hannah Beat MD   Signed by:   Hannah Beat MD on 03/27/2010   Method used:   Print then Give to Patient   RxID:   [id number] HYDROCODONE-ACETAMINOPHEN 5-500 MG TABS (HYDROCODONE-ACETAMINOPHEN) 1 by mouth q 6 hours as needed pain  #30 x 0   Entered and Authorized by:   Hannah Beat MD   Signed by:   Hannah Beat MD on 03/27/2010   Method used:   Print then Give to Patient   RxID:   [id number]    Orders Added: 1)  UA Dipstick W/ Micro (manual) [81000] 2)  T-Culture, Urine [[id number]] 3)  Specimen Handling [99000] 4)  Est. Patient Level IV [41324]    Current Allergies (reviewed today): ! SULFA ! AUGMENTIN  Laboratory Results   Urine Tests  Date/Time Received: March 27, 2010 4:03 PM  Date/Time Reported: March 27, 2010 4:03 PM   Routine Urinalysis   Color: brown Appearance: Cloudy Glucose: negative   (Normal Range: Negative) Bilirubin: negative   (Normal Range: Negative) Ketone: negative   (Normal Range: Negative) Spec. Gravity: >=1.030   (Normal Range: 1.003-1.035) Blood: large   (Normal Range: Negative) pH: 6.0   (Normal Range: 5.0-8.0) Protein: trace   (Normal Range: Negative) Urobilinogen: 0.2   (Normal Range: 0-1) Nitrite: negative   (Normal Range: Negative) Leukocyte Esterace: negative   (Normal Range: Negative)

## 2010-05-15 NOTE — Progress Notes (Signed)
Summary: Urology appt scheduled  Phone Note Outgoing Call   Summary of Call: Called pt to make sure her CT scan was preformed, pt  says everything went fine..sp w/ pt  she was told to schedule appt w/ a urologist.  Pt scheduled the appt on her own for Dec 21th at 10:30am w/ Dr. Artis Flock... Faxed notes to Dr. Artis Flock per pts request... Initial call taken by: Daine Gip,  April 01, 2010 10:38 AM  Follow-up for Phone Call        I agree with POC. Mariyah Upshaw MD  April 01, 2010 11:01 AM

## 2010-07-29 ENCOUNTER — Encounter: Payer: Self-pay | Admitting: Family Medicine

## 2010-07-30 ENCOUNTER — Encounter: Payer: Self-pay | Admitting: Family Medicine

## 2010-07-30 ENCOUNTER — Ambulatory Visit (INDEPENDENT_AMBULATORY_CARE_PROVIDER_SITE_OTHER): Payer: BC Managed Care – PPO | Admitting: Family Medicine

## 2010-07-30 VITALS — BP 100/72 | HR 68 | Temp 98.1°F | Ht 64.0 in | Wt 143.5 lb

## 2010-07-30 DIAGNOSIS — J069 Acute upper respiratory infection, unspecified: Secondary | ICD-10-CM

## 2010-07-30 DIAGNOSIS — J029 Acute pharyngitis, unspecified: Secondary | ICD-10-CM

## 2010-07-30 LAB — POCT RAPID STREP A (OFFICE): Rapid Strep A Screen: NEGATIVE

## 2010-07-30 MED ORDER — AZITHROMYCIN 250 MG PO TABS: ORAL_TABLET | ORAL | Status: DC

## 2010-07-30 MED ORDER — BENZONATATE 200 MG PO CAPS: 200.0000 mg | ORAL_CAPSULE | Freq: Three times a day (TID) | ORAL | Status: AC | PRN

## 2010-07-30 NOTE — Progress Notes (Signed)
  Subjective:    Patient ID: Courtney Pineda, female    DOB: 1970-04-20, 40 y.o.   MRN: [id number]  HPI Here for acute visit for uri symptoms incl cough/ st and drainage Cough for 2 weeks  Other symptoms since Monday   Tried zyrtec over the counter - and it was helpful   St was severe originally- now improved Rapid strep test neg today  Nose is runny and stuffy with lots of drainage -this is yellow /green Some pain behind her eyes      Cough is painful at time - in upper chest  Sometimes in spasms  Sometimes productive- a little yellow and green   No other meds   No fever or chills   There was pertussis outbreak at her daughters school -- but no illness in her family Past Medical History  Diagnosis Date  . Kidney stones   . Atypical moles     abnormal moles removed from neck.  . Hyperemesis gravidarum 1999   Past Surgical History  Procedure Date  . Tonsillectomy   . Cholecystectomy 06/1994  . Tonsilectomy, adenoidectomy, bilateral myringotomy and tubes     myringotomy tubes  . Lithotripsy 03/2006    reports that she has never smoked. She does not have any smokeless tobacco history on file. Her alcohol and drug histories not on file. family history includes Cancer in her maternal grandmother and paternal aunt; Heart disease in her maternal grandfather; and Migraines in her mother. Allergies  Allergen Reactions  . BJY:NWGNFAOZHYQ+MVHQIONGE+XBMWUXLKGM Acid+Aspartame     REACTION: rash  . Sulfonamide Derivatives     REACTION: Lungs close up      Review of Systems  Constitutional: Positive for fatigue. Negative for fever and chills.  HENT: Positive for congestion, sore throat, rhinorrhea, sneezing and postnasal drip. Negative for ear pain and nosebleeds.   Eyes: Negative for pain and visual disturbance.  Respiratory: Positive for cough and chest tightness. Negative for wheezing and stridor.   Cardiovascular: Negative for palpitations.  Gastrointestinal: Negative  for nausea, vomiting, abdominal pain and diarrhea.  Musculoskeletal: Negative for arthralgias.  Skin: Negative for rash.  Neurological: Positive for headaches. Negative for light-headedness and numbness.  Psychiatric/Behavioral: Negative for dysphoric mood.       Objective:   Physical Exam  Constitutional: She appears well-developed and well-nourished. No distress.       Fatigued appearing   HENT:  Head: Normocephalic and atraumatic.  Right Ear: External ear normal.  Left Ear: External ear normal.  Mouth/Throat: Oropharynx is clear and moist.       Post nasal drip- clear Throat clear  No sinus tenderness  Eyes: Conjunctivae and EOM are normal. Pupils are equal, round, and reactive to light. Right eye exhibits no discharge. Left eye exhibits no discharge.  Neck: Normal range of motion. Neck supple.  Cardiovascular: Normal rate, regular rhythm and normal heart sounds.   Pulmonary/Chest: Effort normal and breath sounds normal. No stridor. She has no wheezes. She has no rales. She exhibits tenderness.       Very loud sharp sounding cough that is persistant and spasmotic   Lymphadenopathy:    She has no cervical adenopathy.  Skin: Skin is warm and dry. No rash noted.  Psychiatric: She has a normal mood and affect.          Assessment & Plan:

## 2010-07-30 NOTE — Assessment & Plan Note (Addendum)
Cannot rule out pertussis given persistant cough and poss exposure Although could also be viral Consider booster when better  Cover with zithromax and tessalon for cough Update if side effects or no improvement

## 2010-07-30 NOTE — Patient Instructions (Signed)
Please take the zpak as directed Take tessalon for cough as needed - swallow whole - do not chew  Drink lots of fluids Update me if worse or fever or other symptoms

## 2010-08-26 NOTE — Assessment & Plan Note (Signed)
[facility] OFFICE NOTE   NAME:Courtney Pineda, Courtney Pineda                      MRN:          [id number]  DATE:07/30/2008                            DOB:          Jun 22, 1970    PRIMARY CARE PHYSICIAN:  Marne A. Tower, MD , at [facility].   HISTORY OF PRESENT ILLNESS:  This is a 40 year old with no significant  past medical history who presents for evaluation of palpitations and  chest pain.  The patient states that 1 day a couple of weeks ago she  woke up with a sharp severe left breast pain and that lasted for about 5  or 10 minutes.  She says that her left breast area was actually tender  at the time.  She did not think much about it except a few days later  she went to a [employer] meeting with her son in which they were  discussing emergency medical treatment with some EMTs and the issue of  heart attacks and heart disease came up and it started to worry her.  She went to be checked out at [facility] and she was noted to  have an EKG showing atrial bigeminy.  She was told to stop caffeine and  to come back in the office a week later and she still had atrial  bigeminy on her EKG.  She did note occasional palpitations.  No  lightheadedness or syncope.  Because of these findings and the fact that  she continue to be quite worried, she was referred over to our office to  be evaluated.  On seeing the patient today, she does have an EKG.  She  is in normal sinus rhythm with no PVCs.  She has excellent exercise  tolerance.  She has not had any more chest pain.  She is not getting  shortness of breath or chest pain with exertion.  She continues to drink  about 1-2 caffeinated beverages a day, does not use illicit drugs, does  not smoke, has no family history of premature coronary artery disease.   PAST MEDICAL HISTORY:  1. Nephrolithiasis.  2. History of cholecystectomy.   SOCIAL HISTORY:  The patient is  married.  She has 2 children.  She has a  job working in for a [employer].  She does not smoke.  She does not use  illicit drugs.   FAMILY HISTORY:  The patient's grandfather had an MI and bypass surgery  and actually died at the age of 27.  Parents are both healthy.   REVIEW OF SYSTEMS:  Negative except as noted in history of present  illness.   EKG is normal sinus rhythm.  This is a normal EKG.   Most recent labs from April 2010, LDL 90, HDL 31.5, potassium 4.4,  creatinine 0.8, TSH normal.  LFTs normal.   PHYSICAL EXAMINATION:  VITAL SIGNS:  Blood pressure is 117/77, heart  rate is 61 and regular.  GENERAL:  This is a well-developed female in no apparent distress.  NEUROLOGIC:  Alert and oriented x3.  Normal affect.  LUNGS:  Clear to auscultation bilaterally.  Normal respiratory effort.  CARDIOVASCULAR:  Heart regular S1, S2.  No S3, S4.  There is no murmur.  There is no peripheral edema.  There is no carotid bruit.  There are 2+  posterior tibial pulses bilaterally.  ABDOMEN:  Soft, nontender.  No hepatosplenomegaly.  Normal bowel sounds.  EXTREMITIES:  No clubbing or cyanosis.  SKIN:  Normal exam.  MUSCULOSKELETAL:  Normal exam.  HEENT:  Normal exam.   ASSESSMENT/PLAN:  This is a 40 year old with a history of atypical chest  pain and premature atrial contractions.  1. Chest pain.  The patient's chest pain is quite atypical.  She has      no risk factors for coronary disease.  I do not think this was      cardiac.  I reassured the patient.  I did encourage her to increase      her exercise level in the hope that her HDL will increase over      time.  Her LDL is excellent.  2. Palpitations.  The patient did have PVCs with atrial bigeminy noted      on 2 instances on EKGs at [facility].  Her EKG here today is      normal.  I did encourage her to stay away from caffeine.  Her TSH      is normal.  I think this is benign.  We did talk a bit about this.      The patient is  worried about the abnormality, so therefore I did      say I would set her up for a Holter monitor for 24 hours to make      sure there are no more significant arrhythmias present such as      atrial fibrillation.     Marca Ancona, MD  Electronically Signed    DM/MedQ  DD: 07/30/2008  DT: 07/31/2008  Job #: [id number]   cc:   Marne A. Milinda Antis, MD

## 2010-08-29 NOTE — Op Note (Signed)
[facility]  Patient:    Courtney Pineda, Courtney Pineda Visit Number: [id number] MRN: [id number]          Service Type: DSU Location: DAY Attending Physician:  Liborio Nixon Dictated by:   Bertram Millard. Dahlstedt, M.D. Proc. Date: 07/09/01 Admit Date:  07/09/2001   CC:         Roxy Manns, M.D. [facility]   Operative Report  PREOPERATIVE DIAGNOSIS:  Right ureteral calculus.  POSTOPERATIVE DIAGNOSIS:  Right ureteral calculus.  PRINCIPAL PROCEDURE:  Ureteroscopic stone extraction.  SURGEON:  Bertram Millard. Dahlstedt, M.D.  ANESTHESIA:  General with LMA.  COMPLICATIONS:  None.  BRIEF HISTORY:  A 40 year old female with intermittent right flank and lower quadrant symptoms for a couple of months. She was seen in the office yesterday and was noted to have a ureteral stone. Evaluation included an IVP. Due to the patients length of symptoms as well as her change in occupation soon, it was recommended that she undergo ureteroscopic stone extraction. The risk and complications of the procedure have been discussed with the patient who understands and desires to proceed.  DESCRIPTION OF PROCEDURE:  The patient was administered preoperative IV antibiotics and was taken to the operating room where general endotracheal anesthetic was established. She was placed in the dorsal lithotomy position. Genitalia and perineum were prepped and draped. A 6 French ureteroscope was advanced through the urethra, into her bladder and into her right ureteral orifice where approximately 5 cm up the ureteral calculus was identified. The stone was grasped with a 3 [place] basket and removed. At this point, the bladder was drained and the procedure terminated.  The patient tolerated the procedure well. She was awakened and taken to the PACU in stable condition. Dictated by:   Bertram Millard. Dahlstedt, M.D. Attending Physician:  Liborio Nixon DD:  07/09/01 TD:  07/09/01 Job:  [id number]

## 2010-10-02 ENCOUNTER — Encounter: Payer: Self-pay | Admitting: Cardiovascular Disease

## 2010-10-08 ENCOUNTER — Encounter: Payer: Self-pay | Admitting: Family Medicine

## 2010-10-08 ENCOUNTER — Ambulatory Visit (INDEPENDENT_AMBULATORY_CARE_PROVIDER_SITE_OTHER)
Admission: RE | Admit: 2010-10-08 | Discharge: 2010-10-08 | Disposition: A | Discharge status: Home / Self Care | Payer: BC Managed Care – PPO | Source: Ambulatory Visit | Attending: Family Medicine | Admitting: Family Medicine

## 2010-10-08 ENCOUNTER — Ambulatory Visit (INDEPENDENT_AMBULATORY_CARE_PROVIDER_SITE_OTHER): Payer: BC Managed Care – PPO | Admitting: Family Medicine

## 2010-10-08 VITALS — BP 100/62 | HR 54 | Temp 98.4°F | Ht 64.0 in | Wt 141.0 lb

## 2010-10-08 DIAGNOSIS — M898X9 Other specified disorders of bone, unspecified site: Secondary | ICD-10-CM

## 2010-10-08 DIAGNOSIS — M79672 Pain in left foot: Secondary | ICD-10-CM

## 2010-10-08 DIAGNOSIS — T148XXA Other injury of unspecified body region, initial encounter: Secondary | ICD-10-CM

## 2010-10-08 DIAGNOSIS — M899 Disorder of bone, unspecified: Secondary | ICD-10-CM

## 2010-10-08 DIAGNOSIS — M79676 Pain in unspecified toe(s): Secondary | ICD-10-CM

## 2010-10-08 DIAGNOSIS — M79609 Pain in unspecified limb: Secondary | ICD-10-CM

## 2010-10-08 DIAGNOSIS — M949 Disorder of cartilage, unspecified: Secondary | ICD-10-CM

## 2010-10-10 NOTE — Progress Notes (Signed)
Courtney Pineda, a 40 y.o. female presents today in the office for the following:   Pleasant patient, LEFT foot and toe injury, date of injury, October 08, 2010.  Patient traumatized the dorsal aspect of the LEFT forefoot, and she has been able to walk since that time, but has been walking with a significant limp. She does have some significant dorsal bruising and swelling in and around this area. Most tenderness is around the 3rd and 4th shafts of the metatarsals.  The patient had an object of some sort fall and traumatized his dorsal aspect of her foot. She is not get this immobilized at all, and this is the 1st time she has been evaluated.  Patient Active Problem List  Diagnoses  . NEVUS, ATYPICAL  . STRESS REACTION, ACUTE, WITH EMOTIONAL DISTURBANCE  . URI  . VAGINITIS  . HEADACHE  . MAMMOGRAM, ABNORMAL, LEFT  . Nonspecific abnormal electrocardiogram (ECG) (EKG)  . KIDNEY STONE  . Left foot pain   Past Medical History  Diagnosis Date  . Kidney stones   . Atypical moles     abnormal moles removed from neck.  . Hyperemesis gravidarum 1999   Past Surgical History  Procedure Date  . Tonsillectomy   . Cholecystectomy 06/1994  . Tonsilectomy, adenoidectomy, bilateral myringotomy and tubes     myringotomy tubes  . Lithotripsy 03/2006   History  Substance Use Topics  . Smoking status: Never Smoker   . Smokeless tobacco: Not on file  . Alcohol Use:    Family History  Problem Relation Age of Onset  . Migraines Mother   . Cancer Paternal Aunt     breast cancer  . Cancer Maternal Grandmother     brain tumor/breast cancer  . Heart disease Maternal Grandfather     heart problems   Allergies  Allergen Reactions  . ZOX:WRUEAVWUJWJ+XBJYNWGNF+AOZHYQMVHQ Acid+Aspartame     REACTION: rash  . Sulfonamide Derivatives     REACTION: Lungs close up   Current Outpatient Prescriptions on File Prior to Visit  Medication Sig Dispense Refill  . cetirizine (ZYRTEC) 10 MG tablet Take 10  mg by mouth daily.          REVIEW OF SYSTEMS  GEN: No fevers, chills. Nontoxic. Primarily MSK c/o today. MSK: Detailed in the HPI GI: tolerating PO intake without difficulty Neuro: No numbness, parasthesias, or tingling associated. Otherwise the pertinent positives of the ROS are noted above.    Physical Exam  Blood pressure 100/62, pulse 54, temperature 98.4 F (36.9 C), temperature source Oral, height 5\' 4"  (1.626 m), weight 141 lb (63.957 kg), SpO2 100.00%.  GEN: WDWN, NAD, Non-toxic, A & O x 3 HEENT: Atraumatic, Normocephalic. Neck supple. No masses, No LAD. Ears and Nose: No external deformity. EXTR: No c/c/e NEURO Normal gait.  PSYCH: Normally interactive. Conversant. Not depressed or anxious appearing.  Calm demeanor.   LEFT foot and ankle. Full range of motion at the ankle. Nontender throughout the malleoli. Nontender at the navicular, cuboid, 5th metatarsal. Nontender at the talus bone. No ankle effusion.   Tender at the phalanges, primarily in the great toe region.  Notable tenderness along the shafts of the metatarsal 1st, 2nd, 3rd, 4th. There is some diffuse swelling, and there is also some bruising.  Assessment and Plan: 1.  Foot pain, soft tissue contusion, bone bruise and bony contusion along virtually all metatarsals, additionally, patient has toe pain, likely soft tissue and bone contusion as well in this area.  XR, 3 view,  foot series Indication: foot pain Findings: no evidence of acute fracture or dislocation  X-rays, toe series. Indication colon pain. No evidence of occult fracture or dislocation.  Activity modification is discussed. Expect approximate 3 weeks or more for pain resolution. Discussed a stiff soled shoe would be ideal.

## 2010-11-21 ENCOUNTER — Telehealth: Payer: Self-pay | Admitting: *Deleted

## 2010-11-21 MED ORDER — PERMETHRIN 5 % EX CREA: TOPICAL_CREAM | CUTANEOUS | Status: DC

## 2010-11-21 NOTE — Telephone Encounter (Signed)
rx sent, pt called to follow up as needed.  She agrees.

## 2010-11-21 NOTE — Telephone Encounter (Signed)
Patient has lice that started with her daughter. She says that she has tried OTC products and it isn't working, she is asking if she can get something called in. Uses [facility] on [address]

## 2010-11-28 ENCOUNTER — Telehealth: Payer: Self-pay | Admitting: *Deleted

## 2010-11-28 NOTE — Telephone Encounter (Signed)
Left v/m at pt's home and cell # for pt to call back. Pt is not at work today.

## 2010-11-28 NOTE — Telephone Encounter (Signed)
Patient called last Friday asking for something for head lice, but she says that what Dr. Para March called in was for scabies. She is asking if she can get something for lice called to [facility] on [address].

## 2010-11-28 NOTE — Telephone Encounter (Signed)
That medication is used for both lice and scabies  Has she used it yet? - please update me

## 2010-12-01 MED ORDER — MALATHION 0.5 % EX LOTN: TOPICAL_LOTION | CUTANEOUS | Status: DC

## 2010-12-01 NOTE — Telephone Encounter (Signed)
Pt states she didn't use the medicine.  She has been using rid, but still has some itching and nits.  She would like to use ovide lotion, that she had before.  [facility] [place]

## 2010-12-01 NOTE — Telephone Encounter (Signed)
Px written for call in  It will not allow me to send electronically  Update if this does not work

## 2010-12-01 NOTE — Telephone Encounter (Signed)
Medication phoned to [facility] pharmacy as instructed. Left v/m for pt to call back at all contact #s.

## 2010-12-01 NOTE — Telephone Encounter (Signed)
Patient notified as instructed by telephone. 

## 2010-12-10 ENCOUNTER — Ambulatory Visit (INDEPENDENT_AMBULATORY_CARE_PROVIDER_SITE_OTHER): Payer: BC Managed Care – PPO | Admitting: Family Medicine

## 2010-12-10 ENCOUNTER — Encounter: Payer: Self-pay | Admitting: Family Medicine

## 2010-12-10 DIAGNOSIS — J069 Acute upper respiratory infection, unspecified: Secondary | ICD-10-CM

## 2010-12-10 MED ORDER — ALBUTEROL SULFATE HFA 108 (90 BASE) MCG/ACT IN AERS: 2.0000 | INHALATION_SPRAY | RESPIRATORY_TRACT | Status: DC | PRN

## 2010-12-10 MED ORDER — AZITHROMYCIN 250 MG PO TABS: ORAL_TABLET | ORAL | Status: AC

## 2010-12-10 NOTE — Progress Notes (Signed)
URI sx for ~8 days.  Cough, no sputum.  Voice changes are resolving.  No FCNAV.  + aches recently, some better now.  Some post nasal gtt, no ear pain. Taking tylenol.  Meds, vitals, and allergies reviewed.   ROS: See HPI.  Otherwise, noncontributory.  GEN: nad, alert and oriented HEENT: mucous membranes moist, tm w/o erythema, nasal exam w/o erythema, clear discharge noted,  OP with cobblestoning NECK: supple w/o LA CV: rrr.   PULM: ctab, no inc wob EXT: no edema SKIN: no acute rash

## 2010-12-10 NOTE — Assessment & Plan Note (Signed)
D/w pt about ddx.  Nontoxic, possibly viral.  Hold abx and use sample of SABA in meantime for cough.  Pt coached on ventolin tech.  She understood. F/u prn.

## 2010-12-10 NOTE — Patient Instructions (Signed)
Drink plenty of fluids, take tylenol as needed, and start the antibiotics if you aren't improved in a few days.  Use the inhaler every 4 hours as needed for cough.  This should gradually improve.  Take care.  Let us know if you have other concerns.

## 2010-12-16 ENCOUNTER — Encounter: Payer: Self-pay | Admitting: Family Medicine

## 2010-12-16 ENCOUNTER — Ambulatory Visit (INDEPENDENT_AMBULATORY_CARE_PROVIDER_SITE_OTHER): Payer: BC Managed Care – PPO | Admitting: Family Medicine

## 2010-12-16 ENCOUNTER — Ambulatory Visit: Payer: Self-pay | Admitting: Family Medicine

## 2010-12-16 DIAGNOSIS — J069 Acute upper respiratory infection, unspecified: Secondary | ICD-10-CM

## 2010-12-16 DIAGNOSIS — N209 Urinary calculus, unspecified: Secondary | ICD-10-CM

## 2010-12-16 DIAGNOSIS — B852 Pediculosis, unspecified: Secondary | ICD-10-CM

## 2010-12-16 DIAGNOSIS — M549 Dorsalgia, unspecified: Secondary | ICD-10-CM

## 2010-12-16 LAB — POCT URINALYSIS DIPSTICK
Bilirubin, UA: NEGATIVE
Glucose, UA: NEGATIVE
Ketones, UA: NEGATIVE
Spec Grav, UA: 1.03

## 2010-12-16 MED ORDER — CIPROFLOXACIN HCL 250 MG PO TABS: 250.0000 mg | ORAL_TABLET | Freq: Two times a day (BID) | ORAL | Status: AC

## 2010-12-16 MED ORDER — HYDROCODONE-ACETAMINOPHEN 5-500 MG PO TABS: 2.0000 | ORAL_TABLET | Freq: Four times a day (QID) | ORAL | Status: DC | PRN

## 2010-12-16 NOTE — Assessment & Plan Note (Signed)
Pt has treated with permethrin and malthione and is better  No nits seen on scalp check today

## 2010-12-16 NOTE — Patient Instructions (Signed)
We will schedule CT at check out for likely kidney stone Drink lots of water and lemonade  Take vicodin with caution for pain  Take 3 days of cipro in case of infection If worse - let me know  Follow up 1 week after CT scan  I think you have a viral cold and the vicodin will help the cough too  If symptoms worsen please let me know

## 2010-12-16 NOTE — Assessment & Plan Note (Signed)
Slowly imp with irritative cough Re assuring exam- no wheeze  vicodin will help cough  Update if worse or prod cough/fever

## 2010-12-16 NOTE — Assessment & Plan Note (Addendum)
R flank pain and hematuria  Check CT - abd/pelvis no contrast - r/o hydronephrosis Cover with cipro 3 days also  F/u 1 wk or with urol if needed Disc red flags to watch for  Drink water and lemonade vicodin with caution for pain

## 2010-12-16 NOTE — Progress Notes (Signed)
Subjective:    Patient ID: Courtney Pineda, female    DOB: Apr 23, 1970, 40 y.o.   MRN: [id number]  HPI Yesterday am woke up with R flank pain - rad to abd Chills and also vomited times one  Wrapped up in blanket-did not take her temp  Today pain is worse again - about the same spot  Some pressure when she urinates but no burning  No blood in urine   Last kidney stone passed last dec Sees [facility] -- saw ? Which doctor   Has cough also - saw dr Para March last week and given inhaler (she cannot get a hang of )  Also taking z pack  Non prod cough and drainage  ? If she may have been wheezing  No sinus pain   Had a lice infestation and used 2 products to clear it  Wanted her hair checked today No itch or other symptome   Patient Active Problem List  Diagnoses  . NEVUS, ATYPICAL  . STRESS REACTION, ACUTE, WITH EMOTIONAL DISTURBANCE  . URI  . HEADACHE  . MAMMOGRAM, ABNORMAL, LEFT  . Nonspecific abnormal electrocardiogram (ECG) (EKG)  . KIDNEY STONE  . Lice infested hair   Past Medical History  Diagnosis Date  . Kidney stones   . Atypical moles     abnormal moles removed from neck.  . Hyperemesis gravidarum 1999   Past Surgical History  Procedure Date  . Tonsillectomy   . Cholecystectomy 06/1994  . Tonsilectomy, adenoidectomy, bilateral myringotomy and tubes     myringotomy tubes  . Lithotripsy 03/2006   History  Substance Use Topics  . Smoking status: Never Smoker   . Smokeless tobacco: Not on file  . Alcohol Use:    Family History  Problem Relation Age of Onset  . Migraines Mother   . Cancer Paternal Aunt     breast cancer  . Cancer Maternal Grandmother     brain tumor/breast cancer  . Heart disease Maternal Grandfather     heart problems   Allergies  Allergen Reactions  . WUJ:WJXBJYNWGNF+AOZHYQMVH+QIONGEXBMW Acid+Aspartame     REACTION: rash  . Sulfonamide Derivatives     REACTION: Lungs close up   Current Outpatient Prescriptions on File Prior  to Visit  Medication Sig Dispense Refill  . albuterol (PROVENTIL HFA;VENTOLIN HFA) 108 (90 BASE) MCG/ACT inhaler Inhale 2 puffs into the lungs every 4 (four) hours as needed (cough).      Marland Kitchen azithromycin (ZITHROMAX Z-PAK) 250 MG tablet Take 2 tablets (500 mg) on  Day 1,  followed by 1 tablet (250 mg) once daily on Days 2 through 5.  6 each  0     Review of Systems Review of Systems  Constitutional: Negative for fever, appetite change, fatigue and unexpected weight change.  Eyes: Negative for pain and visual disturbance.  Respiratory: Negative for cough and shortness of breath.   ent pos for post nasal drip/ neg for sinus pain  Cardiovascular: Negative for cp or palpitations    Gastrointestinal: Negative for nausea, diarrhea and constipation.  Genitourinary: Negative for urgency and frequency pos for bladder pressure, neg for dysuria or hematuria MSK pos for back and side pain .  Skin: Negative for pallor or rash   Neurological: Negative for weakness, light-headedness, numbness and headaches.  Hematological: Negative for adenopathy. Does not bruise/bleed easily.  Psychiatric/Behavioral: Negative for dysphoric mood. The patient is not nervous/anxious.          Objective:   Physical Exam  Constitutional:  She appears well-developed and well-nourished. No distress.  HENT:  Head: Normocephalic and atraumatic.  Right Ear: External ear normal.  Left Ear: External ear normal.  Nose: Nose normal.  Mouth/Throat: Oropharynx is clear and moist.  Eyes: Conjunctivae and EOM are normal. Pupils are equal, round, and reactive to light. No scleral icterus.  Neck: Normal range of motion. Neck supple. No JVD present. No thyromegaly present.  Cardiovascular: Normal rate, regular rhythm, normal heart sounds and intact distal pulses.   Pulmonary/Chest: Effort normal and breath sounds normal. No respiratory distress. She has no wheezes.  Abdominal: Soft. Bowel sounds are normal. She exhibits no distension  and no mass. There is tenderness.       Tender over R flank area  Slight suprapubic tenderness  Musculoskeletal: She exhibits tenderness. She exhibits no edema.       Pos R cva and flank tenderness  Lymphadenopathy:    She has no cervical adenopathy.  Neurological: She is alert. She has normal reflexes. Coordination normal.  Skin: Skin is warm and dry. No rash noted. No erythema. No pallor.       No nits or lice seen in scalp  Psychiatric: She has a normal mood and affect.          Assessment & Plan:

## 2010-12-17 ENCOUNTER — Telehealth: Payer: Self-pay | Admitting: *Deleted

## 2010-12-18 ENCOUNTER — Telehealth: Payer: Self-pay | Admitting: Family Medicine

## 2010-12-18 DIAGNOSIS — N209 Urinary calculus, unspecified: Secondary | ICD-10-CM

## 2010-12-18 NOTE — Telephone Encounter (Signed)
Urology consult set up with Dr Evelene Croon on 12/19/2010 at 8am, patient has been notified and records faxed. MK

## 2010-12-18 NOTE — Telephone Encounter (Signed)
Message copied by Judy Pimple on Thu Dec 18, 2010  2:14 PM ------      Message from: Patience Musca      Created: Thu Dec 18, 2010  8:24 AM      Regarding: urological referral       Please see result note.      ----- Message -----         From: Roxy Manns, MD         Sent: 12/17/2010   5:21 PM           To: Yetta Glassman, LPN            I'm going to be out of the office until Tuesday- so the smart thing to do may be to go ahead and get her an appt with her urologist       Let me know if she wants me to refer her       If she is feeling much better or thinks she passed the stone today -- we can play it by ear

## 2010-12-18 NOTE — Telephone Encounter (Signed)
Urology ref done.

## 2011-01-05 ENCOUNTER — Telehealth: Payer: Self-pay | Admitting: Family Medicine

## 2011-01-05 DIAGNOSIS — Z0000 Encounter for general adult medical examination without abnormal findings: Secondary | ICD-10-CM | POA: Insufficient documentation

## 2011-01-05 DIAGNOSIS — Z0001 Encounter for general adult medical examination with abnormal findings: Secondary | ICD-10-CM | POA: Insufficient documentation

## 2011-01-05 NOTE — Telephone Encounter (Signed)
Message copied by Judy Pimple on Mon Jan 05, 2011  6:40 PM ------      Message from: Courtney Pineda      Created: Mon Jan 05, 2011 10:16 AM      Regarding: cpx labs wed 9/26       Please order  future cpx labs for pt's upcomming lab appt.      Thanks      Rodney Booze

## 2011-01-07 ENCOUNTER — Other Ambulatory Visit (INDEPENDENT_AMBULATORY_CARE_PROVIDER_SITE_OTHER): Payer: BC Managed Care – PPO

## 2011-01-07 DIAGNOSIS — Z0000 Encounter for general adult medical examination without abnormal findings: Secondary | ICD-10-CM

## 2011-01-07 LAB — CBC WITH DIFFERENTIAL/PLATELET
Basophils Absolute: 0 10*3/uL (ref 0.0–0.1)
Basophils Relative: 0.6 % (ref 0.0–3.0)
Eosinophils Absolute: 0.1 10*3/uL (ref 0.0–0.7)
Eosinophils Relative: 1.6 % (ref 0.0–5.0)
HCT: 35.2 % — ABNORMAL LOW (ref 36.0–46.0)
Hemoglobin: 11.5 g/dL — ABNORMAL LOW (ref 12.0–15.0)
Lymphocytes Relative: 35.6 % (ref 12.0–46.0)
Lymphs Abs: 1.6 10*3/uL (ref 0.7–4.0)
MCHC: 32.8 g/dL (ref 30.0–36.0)
MCV: 84 fl (ref 78.0–100.0)
Monocytes Absolute: 0.3 10*3/uL (ref 0.1–1.0)
Monocytes Relative: 6.3 % (ref 3.0–12.0)
Neutro Abs: 2.6 10*3/uL (ref 1.4–7.7)
Neutrophils Relative %: 55.9 % (ref 43.0–77.0)
Platelets: 270 10*3/uL (ref 150.0–400.0)
RBC: 4.19 Mil/uL (ref 3.87–5.11)
RDW: 16.3 % — ABNORMAL HIGH (ref 11.5–14.6)
WBC: 4.6 10*3/uL (ref 4.5–10.5)

## 2011-01-07 LAB — LIPID PANEL
Cholesterol: 157 mg/dL (ref 0–200)
HDL: 36.8 mg/dL — ABNORMAL LOW (ref >39.00)
Total CHOL/HDL Ratio: 4
Triglycerides: 76 mg/dL (ref 0.0–149.0)

## 2011-01-07 LAB — COMPREHENSIVE METABOLIC PANEL
AST: 50 U/L — ABNORMAL HIGH (ref 0–37)
BUN: 8 mg/dL (ref 6–23)
Calcium: 9.2 mg/dL (ref 8.4–10.5)
Chloride: 107 mEq/L (ref 96–112)
Creatinine, Ser: 0.7 mg/dL (ref 0.4–1.2)

## 2011-01-13 ENCOUNTER — Other Ambulatory Visit (HOSPITAL_COMMUNITY)
Admission: RE | Admit: 2011-01-13 | Discharge: 2011-01-13 | Disposition: A | Discharge status: Home / Self Care | Payer: BC Managed Care – PPO | Source: Ambulatory Visit | Attending: Family Medicine | Admitting: Family Medicine

## 2011-01-13 ENCOUNTER — Ambulatory Visit (INDEPENDENT_AMBULATORY_CARE_PROVIDER_SITE_OTHER): Payer: BC Managed Care – PPO | Admitting: Family Medicine

## 2011-01-13 ENCOUNTER — Encounter: Payer: Self-pay | Admitting: Family Medicine

## 2011-01-13 VITALS — BP 110/70 | HR 68 | Temp 98.2°F | Ht 64.5 in | Wt 140.8 lb

## 2011-01-13 DIAGNOSIS — Z01419 Encounter for gynecological examination (general) (routine) without abnormal findings: Secondary | ICD-10-CM | POA: Insufficient documentation

## 2011-01-13 DIAGNOSIS — D649 Anemia, unspecified: Secondary | ICD-10-CM

## 2011-01-13 DIAGNOSIS — N209 Urinary calculus, unspecified: Secondary | ICD-10-CM

## 2011-01-13 DIAGNOSIS — R7401 Elevation of levels of liver transaminase levels: Secondary | ICD-10-CM | POA: Insufficient documentation

## 2011-01-13 DIAGNOSIS — Z23 Encounter for immunization: Secondary | ICD-10-CM

## 2011-01-13 DIAGNOSIS — R748 Abnormal levels of other serum enzymes: Secondary | ICD-10-CM | POA: Insufficient documentation

## 2011-01-13 DIAGNOSIS — D239 Other benign neoplasm of skin, unspecified: Secondary | ICD-10-CM

## 2011-01-13 DIAGNOSIS — Z0000 Encounter for general adult medical examination without abnormal findings: Secondary | ICD-10-CM

## 2011-01-13 NOTE — Assessment & Plan Note (Signed)
Likely from heavy menses Will try mvi with iron  Continue to follow  No fatigue or other symptoms

## 2011-01-13 NOTE — Assessment & Plan Note (Signed)
Reviewed health habits including diet and exercise and skin cancer prevention Also reviewed health mt list, fam hx and immunizations  Rev wellness labs in detail 

## 2011-01-13 NOTE — Patient Instructions (Addendum)
I think the tylenol is causing increase in liver enzymes - so try to cut back - go ahead and get kidney stones taken care of so the pain does not persist  For mild anemia from peroids - take over the counter multi vitamin with iron  For low HDL (good cholesterol)- try 1000-3000 mg per day of omega 3 fish oil over the counter Schedule non fasting lab in 2 months for liver function

## 2011-01-13 NOTE — Assessment & Plan Note (Signed)
For lithotripsy soon - for stone on R  Is painful - disc not overtaking tylenol

## 2011-01-13 NOTE — Assessment & Plan Note (Signed)
Ast/alt mildly elevated- suspect from tylenol use  Will try to cut the tylenol and get lithotripsy  For stone to get rid of it with urologist

## 2011-01-13 NOTE — Assessment & Plan Note (Signed)
Seen by derm- will have yearly f/u now Disc sun protection

## 2011-01-13 NOTE — Assessment & Plan Note (Signed)
Annual exam with pap  Some heavy menses- dealing ok with that  Will try mvi with iron daily and continue to follow

## 2011-01-13 NOTE — Progress Notes (Signed)
Subjective:    Patient ID: Courtney Pineda, female    DOB: [birth year], 40 y.o.   MRN: [id number]  HPI Here for annual health mt exam and to review chronic med problems  Wt is stabl with bmi of 23 110/70 good bp  Has been dealing with kidney stones  One passed and one remains  Is deciding on lithotripsy-- has one that is not moving  Is having pain   Flu shot- got that today  Td 04  Pap 9/11 due for that  Menses- last one lasted longer / have been about the same / they do tend to be heavy the first couple days at least  No OC - husb had vasectomy  Hb 11.5 - slt anemic   Ast/alt up at 50 and 51 Tylenol products -- otc tylenol (not using the vicodin)  Lipids fair Lab Results  Component Value Date   CHOL 157 01/07/2011   CHOL 146 12/17/2009   CHOL 137 12/08/2007   Lab Results  Component Value Date   HDL 36.80* 01/07/2011   HDL 30.40* 12/17/2009   HDL 40.9* 12/08/2007   Lab Results  Component Value Date   LDLCALC 105* 01/07/2011   LDLCALC 105* 12/17/2009   LDLCALC 90 12/08/2007   Lab Results  Component Value Date   TRIG 76.0 01/07/2011   TRIG 53.0 12/17/2009   TRIG 76 12/08/2007   Lab Results  Component Value Date   CHOLHDL 4 01/07/2011   CHOLHDL 5 12/17/2009   CHOLHDL 4.3 CALC 12/08/2007   No results found for this basename: LDLDIRECT   has not had much exercise with her back pain  HDL tends to be a bit low  Has not tried fish oil  Patient Active Problem List  Diagnoses  . NEVUS, ATYPICAL  . STRESS REACTION, ACUTE, WITH EMOTIONAL DISTURBANCE  . HEADACHE  . MAMMOGRAM, ABNORMAL, LEFT  . Nonspecific abnormal electrocardiogram (ECG) (EKG)  . KIDNEY STONE  . Routine general medical examination at a health care facility  . Gynecological examination  . Anemia, mild  . Nonspecific elevation of levels of transaminase or lactic acid dehydrogenase (LDH)   Past Medical History  Diagnosis Date  . Kidney stones   . Atypical moles     abnormal moles removed from neck.  .  Hyperemesis gravidarum 1999   Past Surgical History  Procedure Date  . Tonsillectomy   . Cholecystectomy 06/1994  . Tonsilectomy, adenoidectomy, bilateral myringotomy and tubes     myringotomy tubes  . Lithotripsy 03/2006   History  Substance Use Topics  . Smoking status: Never Smoker   . Smokeless tobacco: Not on file  . Alcohol Use:    Family History  Problem Relation Age of Onset  . Migraines Mother   . Cancer Paternal Aunt     breast cancer  . Cancer Maternal Grandmother     brain tumor/breast cancer  . Heart disease Maternal Grandfather     heart problems   Allergies  Allergen Reactions  . WJX:BJYNWGNFAOZ+HYQMVHQIO+NGEXBMWUXL Acid+Aspartame     REACTION: rash  . Sulfonamide Derivatives     REACTION: Lungs close up   Current Outpatient Prescriptions on File Prior to Visit  Medication Sig Dispense Refill  . albuterol (PROVENTIL HFA;VENTOLIN HFA) 108 (90 BASE) MCG/ACT inhaler Inhale 2 puffs into the lungs every 4 (four) hours as needed (cough).           Review of Systems Review of Systems  Constitutional: Negative for fever, appetite change, fatigue and  unexpected weight change.  Eyes: Negative for pain and visual disturbance.  Respiratory: Negative for cough and shortness of breath.   Cardiovascular: Negative for cp or palpitations    Gastrointestinal: Negative for nausea, diarrhea and constipation.  MSK pos for R side and back pain Genitourinary: Negative for urgency and frequency.  Skin: Negative for pallor or rash   Neurological: Negative for weakness, light-headedness, numbness and headaches.  Hematological: Negative for adenopathy. Does not bruise/bleed easily.  Psychiatric/Behavioral: Negative for dysphoric mood. The patient is not nervous/anxious.          Objective:   Physical Exam  Constitutional: She appears well-developed and well-nourished. No distress.  HENT:  Head: Normocephalic and atraumatic.  Right Ear: External ear normal.  Left  Ear: External ear normal.  Nose: Nose normal.  Mouth/Throat: Oropharynx is clear and moist.  Eyes: Conjunctivae and EOM are normal. Pupils are equal, round, and reactive to light.  Neck: Normal range of motion. Neck supple. No JVD present. No thyromegaly present.  Cardiovascular: Normal rate, regular rhythm, normal heart sounds and intact distal pulses.  Exam reveals no gallop.   Pulmonary/Chest: Effort normal and breath sounds normal. No respiratory distress. She has no wheezes. She exhibits no tenderness.  Abdominal: Soft. Bowel sounds are normal. She exhibits no distension and no mass. There is no tenderness.  Genitourinary: Vagina normal and uterus normal. No breast swelling, tenderness, discharge or bleeding. No vaginal discharge found.  Musculoskeletal: Normal range of motion. She exhibits tenderness. She exhibits no edema.       R flank tenderness   Lymphadenopathy:    She has no cervical adenopathy.  Neurological: She is alert. She has normal reflexes. No cranial nerve deficit. Coordination normal.  Skin: Skin is warm and dry. No rash noted. No erythema. No pallor.  Psychiatric: She has a normal mood and affect.          Assessment & Plan:

## 2011-01-14 ENCOUNTER — Ambulatory Visit: Payer: Self-pay | Admitting: Urology

## 2011-01-15 ENCOUNTER — Ambulatory Visit: Payer: Self-pay | Admitting: Urology

## 2011-01-19 ENCOUNTER — Encounter: Payer: Self-pay | Admitting: *Deleted

## 2011-02-19 ENCOUNTER — Ambulatory Visit (INDEPENDENT_AMBULATORY_CARE_PROVIDER_SITE_OTHER): Payer: BC Managed Care – PPO | Admitting: Family Medicine

## 2011-02-19 ENCOUNTER — Ambulatory Visit (INDEPENDENT_AMBULATORY_CARE_PROVIDER_SITE_OTHER)
Admission: RE | Admit: 2011-02-19 | Discharge: 2011-02-19 | Disposition: A | Discharge status: Home / Self Care | Payer: BC Managed Care – PPO | Source: Ambulatory Visit | Attending: Family Medicine | Admitting: Family Medicine

## 2011-02-19 ENCOUNTER — Encounter: Payer: Self-pay | Admitting: Family Medicine

## 2011-02-19 VITALS — BP 122/74 | HR 76 | Temp 98.5°F | Wt 140.8 lb

## 2011-02-19 DIAGNOSIS — M25562 Pain in left knee: Secondary | ICD-10-CM

## 2011-02-19 DIAGNOSIS — M25569 Pain in unspecified knee: Secondary | ICD-10-CM

## 2011-02-19 MED ORDER — NAPROXEN 500 MG PO TABS: ORAL_TABLET | ORAL | Status: AC

## 2011-02-19 NOTE — Progress Notes (Signed)
  Subjective:    Patient ID: Courtney Pineda, female    DOB: 1970-12-17, 40 y.o.   MRN: [id number]  HPI CC: knee pain  DOI: 02/18/2011.  Husband picked her up last night, L knee made popping sound.  Twisting motion of leg under knee.  Felt like previous injury right side.  Swollen, tender throughout knee.  No locking or popping with walking but unable to keep straight.  + mild knee instability.  Mainly pain with weight bearing.  H/o R knee injury skiing trip 15 yrs ago - never had surgery, but did go through PT.  Saw Dr. Kennith Center at [facility]    Lithotripsy 1 mo ago.  Review of Systems Per HPI    Objective:   Physical Exam  Nursing note and vitals reviewed. Constitutional: She is oriented to person, place, and time. She appears well-developed and well-nourished. No distress.  Musculoskeletal:       Right knee: Normal.       Left knee: She exhibits decreased range of motion, swelling and effusion. She exhibits normal alignment, normal patellar mobility and normal meniscus. tenderness found. Medial joint line, lateral joint line and MCL tenderness noted.       Difficult exam 2/2 swelling, pain.  Effusion present. Limited ROM - flexion limited to ~75d, extension to ~30d. Max tenderness medial joint line, as well as medial to patella but tender throughout knee. Drawer negative but limited 2/2 swelling. No crepitus.  Tender with mcmurray's   Neurological: She is alert and oriented to person, place, and time. She has normal reflexes.  Skin: Skin is warm and dry. No rash noted.       Assessment & Plan:

## 2011-02-19 NOTE — Assessment & Plan Note (Signed)
Concern for ligament or meniscal injury.   Placed in semi-rigid knee brace, crutches.  rec no weight bearing until sees ortho tomorrow. Refer to ortho for further evaluation. NSAIDs and ice. xrays today - no evidence of fracture or arthritis.  + effusion.

## 2011-02-19 NOTE — Patient Instructions (Signed)
I am worried about knee ligament injury.  Knee brace and crutches today. Also I've sent in naprosyn to your pharmacy.  Ok to take even with kidney stones.  Ice knee 20 min at a time, 2-3 times /day. I'd like you to see orthopedist tomorrow.  Shirlee Limerick will give you a call tomorrow to set that up.

## 2011-03-17 ENCOUNTER — Other Ambulatory Visit (INDEPENDENT_AMBULATORY_CARE_PROVIDER_SITE_OTHER): Payer: BC Managed Care – PPO

## 2011-03-17 DIAGNOSIS — R7401 Elevation of levels of liver transaminase levels: Secondary | ICD-10-CM

## 2011-03-17 LAB — HEPATIC FUNCTION PANEL
ALT: 13 U/L (ref 0–35)
AST: 22 U/L (ref 0–37)
Albumin: 4.2 g/dL (ref 3.5–5.2)
Alkaline Phosphatase: 57 U/L (ref 39–117)
Total Protein: 7.3 g/dL (ref 6.0–8.3)

## 2011-04-28 ENCOUNTER — Ambulatory Visit: Payer: Self-pay | Admitting: Specialist

## 2012-01-25 ENCOUNTER — Telehealth: Payer: Self-pay | Admitting: Family Medicine

## 2012-01-25 DIAGNOSIS — Z0000 Encounter for general adult medical examination without abnormal findings: Secondary | ICD-10-CM

## 2012-01-25 NOTE — Telephone Encounter (Signed)
Message copied by Judy Pimple on Mon Jan 25, 2012  9:19 PM ------      Message from: Alvina Chou      Created: Wed Jan 20, 2012  3:19 PM      Regarding: lab orders for Tuesday, 10.15.13       Patient is scheduled for CPX labs, please order future labs, Thanks , Camelia Eng

## 2012-01-26 ENCOUNTER — Other Ambulatory Visit (INDEPENDENT_AMBULATORY_CARE_PROVIDER_SITE_OTHER): Payer: BC Managed Care – PPO

## 2012-01-26 DIAGNOSIS — Z0000 Encounter for general adult medical examination without abnormal findings: Secondary | ICD-10-CM

## 2012-01-26 LAB — COMPREHENSIVE METABOLIC PANEL
ALT: 24 U/L (ref 0–35)
AST: 29 U/L (ref 0–37)
CO2: 25 mEq/L (ref 19–32)
Calcium: 9.1 mg/dL (ref 8.4–10.5)
Chloride: 106 mEq/L (ref 96–112)
GFR: 87.47 mL/min (ref >60.00)
Sodium: 139 mEq/L (ref 135–145)
Total Protein: 6.9 g/dL (ref 6.0–8.3)

## 2012-01-26 LAB — LIPID PANEL
Total CHOL/HDL Ratio: 4
VLDL: 13.4 mg/dL (ref 0.0–40.0)

## 2012-01-26 LAB — CBC WITH DIFFERENTIAL/PLATELET
Basophils Absolute: 0 10*3/uL (ref 0.0–0.1)
Eosinophils Absolute: 0.1 10*3/uL (ref 0.0–0.7)
Lymphocytes Relative: 27.9 % (ref 12.0–46.0)
Monocytes Relative: 8.9 % (ref 3.0–12.0)
Neutrophils Relative %: 61.1 % (ref 43.0–77.0)
Platelets: 250 10*3/uL (ref 150.0–400.0)
RDW: 15.1 % — ABNORMAL HIGH (ref 11.5–14.6)

## 2012-01-26 LAB — TSH: TSH: 1.58 u[IU]/mL (ref 0.35–5.50)

## 2012-02-02 ENCOUNTER — Encounter: Payer: Self-pay | Admitting: Family Medicine

## 2012-02-02 ENCOUNTER — Other Ambulatory Visit (HOSPITAL_COMMUNITY)
Admission: RE | Admit: 2012-02-02 | Discharge: 2012-02-02 | Disposition: A | Discharge status: Home / Self Care | Payer: BC Managed Care – PPO | Source: Ambulatory Visit | Attending: Family Medicine | Admitting: Family Medicine

## 2012-02-02 ENCOUNTER — Ambulatory Visit (INDEPENDENT_AMBULATORY_CARE_PROVIDER_SITE_OTHER): Payer: BC Managed Care – PPO | Admitting: Family Medicine

## 2012-02-02 VITALS — BP 108/78 | HR 78 | Temp 98.5°F | Ht 64.25 in | Wt 137.0 lb

## 2012-02-02 DIAGNOSIS — Z01419 Encounter for gynecological examination (general) (routine) without abnormal findings: Secondary | ICD-10-CM | POA: Insufficient documentation

## 2012-02-02 DIAGNOSIS — Z0000 Encounter for general adult medical examination without abnormal findings: Secondary | ICD-10-CM

## 2012-02-02 DIAGNOSIS — Z1151 Encounter for screening for human papillomavirus (HPV): Secondary | ICD-10-CM | POA: Insufficient documentation

## 2012-02-02 DIAGNOSIS — D649 Anemia, unspecified: Secondary | ICD-10-CM

## 2012-02-02 DIAGNOSIS — Z23 Encounter for immunization: Secondary | ICD-10-CM

## 2012-02-02 NOTE — Assessment & Plan Note (Signed)
Reviewed health habits including diet and exercise and skin cancer prevention Also reviewed health mt list, fam hx and immunizations  HDL is slt low - disc exercise to raise this Rev wellness labs in detail

## 2012-02-02 NOTE — Progress Notes (Signed)
Subjective:    Patient ID: Courtney Pineda, female    DOB: Jan 20, 1971, 41 y.o.   MRN: [id number]  HPI Here for health maintenance exam and to review chronic medical problems    Is chronically fatigued from job  Stress at home and at job  A lot going on with her son in 8th grade - school and peer issues   Has not had time to exercise - will soon, however - trying to squeeze it in   Had her annual exam derm- given meds for blotchiness on her back - including a soap / shampoo   Flu vaccine- will get today   Pap 10/12-normal  No gyn issues , has odor at times , no burning or itching  Menses- regular - due to start any time  Heavy at the beginning  Had one really heavy one several months ago    Mild anemia is stable Lab Results  Component Value Date   WBC 5.7 01/26/2012   HGB 11.9* 01/26/2012   HCT 37.3 01/26/2012   MCV 83.2 01/26/2012   PLT 250.0 01/26/2012    Other labs   Chemistry      Component Value Date/Time   NA 139 01/26/2012 0826   K 4.1 01/26/2012 0826   CL 106 01/26/2012 0826   CO2 25 01/26/2012 0826   BUN 10 01/26/2012 0826   CREATININE 0.8 01/26/2012 0826      Component Value Date/Time   CALCIUM 9.1 01/26/2012 0826   ALKPHOS 52 01/26/2012 0826   AST 29 01/26/2012 0826   ALT 24 01/26/2012 0826   BILITOT 0.9 01/26/2012 0826     Lab Results  Component Value Date   CHOL 138 01/26/2012   CHOL 157 01/07/2011   CHOL 146 12/17/2009   Lab Results  Component Value Date   HDL 32.60* 01/26/2012   HDL 36.80* 01/07/2011   HDL 30.40* 12/17/2009   Lab Results  Component Value Date   LDLCALC 92 01/26/2012   LDLCALC 105* 01/07/2011   LDLCALC 105* 12/17/2009   Lab Results  Component Value Date   TRIG 67.0 01/26/2012   TRIG 76.0 01/07/2011   TRIG 53.0 12/17/2009   Lab Results  Component Value Date   CHOLHDL 4 01/26/2012   CHOLHDL 4 01/07/2011   CHOLHDL 5 12/17/2009   No results found for this basename: LDLDIRECT   needs to exercise to bring up her HDL     Patient Active Problem List  Diagnosis  . NEVUS, ATYPICAL  . STRESS REACTION, ACUTE, WITH EMOTIONAL DISTURBANCE  . HEADACHE  . MAMMOGRAM, ABNORMAL, LEFT  . Nonspecific abnormal electrocardiogram (ECG) (EKG)  . KIDNEY STONE  . Routine general medical examination at a health care facility  . Routine gynecological examination  . Anemia, mild  . Nonspecific elevation of levels of transaminase or lactic acid dehydrogenase (LDH)  . Left knee pain   Past Medical History  Diagnosis Date  . Kidney stones   . Atypical moles     abnormal moles removed from neck.  . Hyperemesis gravidarum 1999   Past Surgical History  Procedure Date  . Tonsillectomy   . Cholecystectomy 06/1994  . Tonsilectomy, adenoidectomy, bilateral myringotomy and tubes     myringotomy tubes  . Lithotripsy 03/2006   History  Substance Use Topics  . Smoking status: Never Smoker   . Smokeless tobacco: Not on file  . Alcohol Use: No   Family History  Problem Relation Age of Onset  . Migraines Mother   .  Cancer Paternal Aunt     breast cancer  . Cancer Maternal Grandmother     brain tumor/breast cancer  . Heart disease Maternal Grandfather     heart problems   Allergies  Allergen Reactions  . Amoxicillin-Pot Clavulanate     REACTION: rash  . Sulfonamide Derivatives     REACTION: Lungs close up   Current Outpatient Prescriptions on File Prior to Visit  Medication Sig Dispense Refill  . acetaminophen (TYLENOL) 500 MG tablet Take 1,500 mg by mouth daily as needed.        . naproxen (NAPROSYN) 500 MG tablet Take one po bid x 1 week then prn pain, take with food  60 tablet  0  . albuterol (PROVENTIL HFA;VENTOLIN HFA) 108 (90 BASE) MCG/ACT inhaler Inhale 2 puffs into the lungs every 4 (four) hours as needed (cough).            Review of Systems Review of Systems  Constitutional: Negative for fever, appetite change, fatigue and unexpected weight change.  Eyes: Negative for pain and visual  disturbance.  Respiratory: Negative for cough and shortness of breath.   Cardiovascular: Negative for cp or palpitations    Gastrointestinal: Negative for nausea, diarrhea and constipation.  Genitourinary: Negative for urgency and frequency.  Skin: Negative for pallor or rash   Neurological: Negative for weakness, light-headedness, numbness and headaches.  Hematological: Negative for adenopathy. Does not bruise/bleed easily.  Psychiatric/Behavioral: Negative for dysphoric mood. The patient is not nervous/anxious.  pos for stressors       Objective:   Physical Exam  Constitutional: She appears well-developed and well-nourished. No distress.  HENT:  Head: Normocephalic and atraumatic.  Right Ear: External ear normal.  Left Ear: External ear normal.  Nose: Nose normal.  Mouth/Throat: Oropharynx is clear and moist.  Eyes: Conjunctivae normal and EOM are normal. Pupils are equal, round, and reactive to light. Right eye exhibits no discharge. Left eye exhibits no discharge. No scleral icterus.  Neck: Normal range of motion. Neck supple. No JVD present. Carotid bruit is not present. No thyromegaly present.  Cardiovascular: Normal rate, regular rhythm, normal heart sounds and intact distal pulses.  Exam reveals no gallop.   Pulmonary/Chest: Effort normal and breath sounds normal. No respiratory distress. She has no wheezes.  Abdominal: Soft. Bowel sounds are normal. She exhibits no distension, no abdominal bruit and no mass. There is no tenderness.  Genitourinary: Vagina normal and uterus normal. No breast swelling, tenderness, discharge or bleeding. There is no rash, tenderness or lesion on the right labia. There is no rash, tenderness or lesion on the left labia. Uterus is not enlarged and not tender. Cervix exhibits no motion tenderness, no discharge and no friability. Right adnexum displays no mass, no tenderness and no fullness. Left adnexum displays no mass, no tenderness and no fullness. No  bleeding around the vagina.       Breast exam: No mass, nodules, thickening, tenderness, bulging, retraction, inflamation, nipple discharge or skin changes noted.  No axillary or clavicular LA.  Chaperoned exam.    Musculoskeletal: She exhibits no edema and no tenderness.  Lymphadenopathy:    She has no cervical adenopathy.  Neurological: She is alert. She has normal reflexes. No cranial nerve deficit. She exhibits normal muscle tone. Coordination normal.  Skin: Skin is warm and dry. No rash noted. No erythema. No pallor.  Psychiatric: She has a normal mood and affect.          Assessment & Plan:

## 2012-02-02 NOTE — Patient Instructions (Signed)
Pap today Get back to exercise when you can - to help deal with stress and also raise your HDL (good) cholesterol Flu vaccine today  Keep taking care of yourself the best you can

## 2012-02-02 NOTE — Assessment & Plan Note (Signed)
This is improved/ very mild  Suspect due to menses  Can take mvi with iron if desired

## 2012-02-02 NOTE — Assessment & Plan Note (Signed)
Annual exam with pap  Heavy menses at times -tolerates this with mild anemia

## 2012-02-08 ENCOUNTER — Encounter: Payer: Self-pay | Admitting: *Deleted

## 2012-05-05 ENCOUNTER — Ambulatory Visit (INDEPENDENT_AMBULATORY_CARE_PROVIDER_SITE_OTHER): Payer: BC Managed Care – PPO | Admitting: Family Medicine

## 2012-05-05 ENCOUNTER — Encounter: Payer: Self-pay | Admitting: Family Medicine

## 2012-05-05 VITALS — BP 114/74 | HR 88 | Temp 98.3°F | Wt 139.0 lb

## 2012-05-05 DIAGNOSIS — J029 Acute pharyngitis, unspecified: Secondary | ICD-10-CM

## 2012-05-05 DIAGNOSIS — J069 Acute upper respiratory infection, unspecified: Secondary | ICD-10-CM | POA: Insufficient documentation

## 2012-05-05 LAB — POCT RAPID STREP A (OFFICE): Rapid Strep A Screen: NEGATIVE

## 2012-05-05 MED ORDER — FLUTICASONE PROPIONATE 50 MCG/ACT NA SUSP: 2.0000 | Freq: Every day | NASAL | Status: DC

## 2012-05-05 MED ORDER — BENZONATATE 200 MG PO CAPS: 200.0000 mg | ORAL_CAPSULE | Freq: Three times a day (TID) | ORAL | Status: AC | PRN

## 2012-05-05 NOTE — Assessment & Plan Note (Signed)
Likely viral. Nontoxic.  RST neg.  If flu, she would appear to have a relatively mild case and with sx >48h likely wouldn't have sig benefit from tamiflu.  D/w pt.  Supportive measures in meantime.  flonase for nasal sx and can use tessalon prn for cough.  She agrees.  No indication for abx currently.

## 2012-05-05 NOTE — Progress Notes (Signed)
Had a flu shot.  ~2.5 days of sx.  Started with ST.  Had aches, subjective fevers, chills (she thinks her thermometer at home isn't working).  Stuffy and drainage.  Fatigued.  Cough, some sputum, usually clear.  Eating and drinking well.  No ear pain.  Diffuse aches.    Meds, vitals, and allergies reviewed.   ROS: See HPI.  Otherwise, noncontributory.  GEN: nad, alert and oriented HEENT: mucous membranes moist, tm w/o erythema, nasal exam w/o erythema, scant clear discharge noted,  OP with mild cobblestoning NECK: supple w/o LA CV: rrr.   PULM: ctab, no inc wob  RST neg.

## 2012-05-06 ENCOUNTER — Ambulatory Visit: Payer: BC Managed Care – PPO | Admitting: Family Medicine

## 2012-05-09 ENCOUNTER — Ambulatory Visit (INDEPENDENT_AMBULATORY_CARE_PROVIDER_SITE_OTHER): Payer: BC Managed Care – PPO | Admitting: Family Medicine

## 2012-05-09 ENCOUNTER — Telehealth: Payer: Self-pay | Admitting: Family Medicine

## 2012-05-09 ENCOUNTER — Encounter: Payer: Self-pay | Admitting: Family Medicine

## 2012-05-09 VITALS — BP 110/68 | HR 62 | Temp 98.7°F | Ht 64.25 in | Wt 138.0 lb

## 2012-05-09 DIAGNOSIS — J069 Acute upper respiratory infection, unspecified: Secondary | ICD-10-CM

## 2012-05-09 MED ORDER — HYDROCODONE-HOMATROPINE 5-1.5 MG/5ML PO SYRP: 5.0000 mL | ORAL_SOLUTION | Freq: Four times a day (QID) | ORAL | Status: DC | PRN

## 2012-05-09 NOTE — Assessment & Plan Note (Signed)
No better and no worse today- reassuring exam with clear lungs and no sinus tenderness Will try to tx sympt with hycodan cough med - warning of sedation Disc symptomatic care - see instructions on AVS  Update if not starting to improve in a week or if worsening  - will be on the lookout for bact infx s/s Off work till [employer]

## 2012-05-09 NOTE — Telephone Encounter (Signed)
Patient Information:  Caller Name: Rosey Bath  Phone: [phone]  Patient: Egan, Sahlin  Gender: Female  DOB: [birth year]  Age: 42 Years  PCP: Tower, [employer] [facility])  Pregnant: No  Office Follow Up:  Does the office need to follow up with this patient?: No  Instructions For The Office: N/A   Symptoms  Reason For Call & Symptoms: pt has cough and congestion.  Pt was seen in the office on 05/05/12 by Dr Para March  Reviewed Health History In EMR: Yes  Reviewed Medications In EMR: Yes  Reviewed Allergies In EMR: Yes  Reviewed Surgeries / Procedures: Yes  Date of Onset of Symptoms: 05/02/2012  Treatments Tried: flonase  Treatments Tried Worked: No OB / GYN:  LMP: 04/25/2012  Guideline(s) Used:  Cough  Disposition Per Guideline:   See Today or Tomorrow in Office  Reason For Disposition Reached:   Patient wants to be seen  Advice Given:  Coughing Spasms:  Drink warm fluids. Inhale warm mist (Reason: both relax the airway and loosen up the phlegm).  Prevent Dehydration:  Drink adequate liquids.  Call Back If:  You become worse.  Appointment Scheduled:  05/09/2012 08:30:00 Appointment Scheduled Provider:  Roxy Manns [facility])

## 2012-05-09 NOTE — Telephone Encounter (Signed)
Pt was seen

## 2012-05-09 NOTE — Patient Instructions (Addendum)
Drink lots of fluids and rest Take the cough syrup as needed - watch out for sedation Back to work Wednesday if you are feeling better Update if not starting to improve in a week or if worsening

## 2012-05-09 NOTE — Progress Notes (Signed)
Subjective:    Patient ID: Courtney Pineda, female    DOB: July 11, 1970, 42 y.o.   MRN: [id number]  HPI Here for acute uri symptoms  Was seen on 1/23 and dx with uri   Does not seem to be getting better  Using flonase for congestion  Bad cough - taking tessalon and that is not helping much  Some clear mucous / occ a little color to it   Facial pressure from sinus congestion   Feels feverish but not fever by thermometer  Has had aching and chills - a bit improved yesterday  No wheezing    Stayed home over the weekend and rested   Patient Active Problem List  Diagnosis  . NEVUS, ATYPICAL  . STRESS REACTION, ACUTE, WITH EMOTIONAL DISTURBANCE  . HEADACHE  . MAMMOGRAM, ABNORMAL, LEFT  . Nonspecific abnormal electrocardiogram (ECG) (EKG)  . KIDNEY STONE  . Routine general medical examination at a health care facility  . Routine gynecological examination  . Anemia, mild  . Nonspecific elevation of levels of transaminase or lactic acid dehydrogenase (LDH)  . Left knee pain  . URI (upper respiratory infection)   Past Medical History  Diagnosis Date  . Kidney stones   . Atypical moles     abnormal moles removed from neck.  . Hyperemesis gravidarum 1999   Past Surgical History  Procedure Date  . Tonsillectomy   . Cholecystectomy 06/1994  . Tonsilectomy, adenoidectomy, bilateral myringotomy and tubes     myringotomy tubes  . Lithotripsy 03/2006   History  Substance Use Topics  . Smoking status: Never Smoker   . Smokeless tobacco: Not on file  . Alcohol Use: No   Family History  Problem Relation Age of Onset  . Migraines Mother   . Cancer Paternal Aunt     breast cancer  . Cancer Maternal Grandmother     brain tumor/breast cancer  . Heart disease Maternal Grandfather     heart problems   Allergies  Allergen Reactions  . Sulfonamide Derivatives     REACTION: Lungs close up  . Amoxicillin-Pot Clavulanate     REACTION: rash   Current Outpatient Prescriptions  on File Prior to Visit  Medication Sig Dispense Refill  . acetaminophen (TYLENOL) 500 MG tablet Take 1,500 mg by mouth daily as needed.        . benzonatate (TESSALON) 200 MG capsule Take 1 capsule (200 mg total) by mouth 3 (three) times daily as needed for cough.  30 capsule  1  . fluticasone (FLONASE) 50 MCG/ACT nasal spray Place 2 sprays into the nose daily.  16 g  0       Review of Systems Review of Systems  Constitutional: Negative for fever, appetite change,  and unexpected weight change. pos for fatigue ENT pos for congestion/ rhinorrhea /st Eyes: Negative for pain and visual disturbance.  Respiratory: Negative for wheeze and shortness of breath.   Cardiovascular: Negative for cp or palpitations    Gastrointestinal: Negative for nausea, diarrhea and constipation.  Genitourinary: Negative for urgency and frequency.  Skin: Negative for pallor or rash   Neurological: Negative for weakness, light-headedness, numbness and headaches.  Hematological: Negative for adenopathy. Does not bruise/bleed easily.  Psychiatric/Behavioral: Negative for dysphoric mood. The patient is not nervous/anxious.         Objective:   Physical Exam  Constitutional: She appears well-developed and well-nourished. No distress.  HENT:  Head: Normocephalic and atraumatic.  Right Ear: External ear normal.  Left Ear:  External ear normal.  Mouth/Throat: Oropharynx is clear and moist. No oropharyngeal exudate.       Nares are injected and congested  No sinus tenderness   Eyes: Conjunctivae normal and EOM are normal. Pupils are equal, round, and reactive to light. Right eye exhibits no discharge. Left eye exhibits no discharge.  Neck: Normal range of motion. Neck supple.  Cardiovascular: Normal rate and regular rhythm.   Pulmonary/Chest: Effort normal and breath sounds normal. No respiratory distress. She has no wheezes. She has no rales. She exhibits no tenderness.  Lymphadenopathy:    She has no cervical  adenopathy.  Skin: Skin is warm and dry. No rash noted.  Psychiatric: She has a normal mood and affect.          Assessment & Plan:

## 2012-12-06 ENCOUNTER — Encounter: Payer: Self-pay | Admitting: Family Medicine

## 2012-12-06 ENCOUNTER — Ambulatory Visit (INDEPENDENT_AMBULATORY_CARE_PROVIDER_SITE_OTHER): Payer: BC Managed Care – PPO | Admitting: Family Medicine

## 2012-12-06 ENCOUNTER — Telehealth: Payer: Self-pay | Admitting: Family Medicine

## 2012-12-06 VITALS — BP 122/68 | HR 56 | Temp 99.3°F | Ht 64.25 in | Wt 140.0 lb

## 2012-12-06 DIAGNOSIS — M25562 Pain in left knee: Secondary | ICD-10-CM

## 2012-12-06 DIAGNOSIS — M25569 Pain in unspecified knee: Secondary | ICD-10-CM

## 2012-12-06 MED ORDER — MELOXICAM 15 MG PO TABS: 15.0000 mg | ORAL_TABLET | Freq: Every day | ORAL | Status: DC

## 2012-12-06 NOTE — Assessment & Plan Note (Signed)
Different from prev pain (of note neg MRI in 2012) Now pain is posterior with stiffness/ ? Fullness Suspect for Bakers cyst  Ordered US mobic 15 mg daily with food prn pain and cold compress elevation Pend results

## 2012-12-06 NOTE — Telephone Encounter (Signed)
Patient is hoping to get ultrasound referral as soon as possible

## 2012-12-06 NOTE — Progress Notes (Signed)
Subjective:    Patient ID: Courtney Pineda, female    DOB: 1970/08/02, 42 y.o.   MRN: [id number]  HPI Here for L knee pain  Is also getting over a cold after a [place] (a great trip however)  Hx of knee problems  Had a fall 2 y ago - saw Dr Hyacinth Meeker / ortho - did and MRI and -no tear - so treated conservatively  Now she has times when she has stiffness and sharp pain in the back of the knee  Happens more often now  No new trauma  Does bother her with prolonged walking (like at [place]ney) Takes tylenol occasionally   No kidney stones lately   Chemistry      Component Value Date/Time   NA 139 01/26/2012 0826   K 4.1 01/26/2012 0826   CL 106 01/26/2012 0826   CO2 25 01/26/2012 0826   BUN 10 01/26/2012 0826   CREATININE 0.8 01/26/2012 0826      Component Value Date/Time   CALCIUM 9.1 01/26/2012 0826   ALKPHOS 52 01/26/2012 0826   AST 29 01/26/2012 0826   ALT 24 01/26/2012 0826   BILITOT 0.9 01/26/2012 0826      Patient Active Problem List   Diagnosis Date Noted  . URI (upper respiratory infection) 05/05/2012  . Left knee pain 02/19/2011  . Routine gynecological examination 01/13/2011  . Anemia, mild 01/13/2011  . Nonspecific elevation of levels of transaminase or lactic acid dehydrogenase (LDH) 01/13/2011  . Routine general medical examination at a health care facility 01/05/2011  . KIDNEY STONE 03/27/2010  . MAMMOGRAM, ABNORMAL, LEFT 01/14/2009  . Nonspecific abnormal electrocardiogram (ECG) (EKG) 05/31/2008  . STRESS REACTION, ACUTE, WITH EMOTIONAL DISTURBANCE 09/13/2007  . HEADACHE 09/13/2007  . NEVUS, ATYPICAL 08/20/2006   Past Medical History  Diagnosis Date  . Kidney stones   . Atypical moles     abnormal moles removed from neck.  . Hyperemesis gravidarum 1999   Past Surgical History  Procedure Laterality Date  . Tonsillectomy    . Cholecystectomy  06/1994  . Tonsilectomy, adenoidectomy, bilateral myringotomy and tubes      myringotomy tubes  .  Lithotripsy  03/2006   History  Substance Use Topics  . Smoking status: Never Smoker   . Smokeless tobacco: Not on file  . Alcohol Use: No   Family History  Problem Relation Age of Onset  . Migraines Mother   . Cancer Paternal Aunt     breast cancer  . Cancer Maternal Grandmother     brain tumor/breast cancer  . Heart disease Maternal Grandfather     heart problems   Allergies  Allergen Reactions  . Sulfonamide Derivatives     REACTION: Lungs close up  . Amoxicillin-Pot Clavulanate     REACTION: rash   Current Outpatient Prescriptions on File Prior to Visit  Medication Sig Dispense Refill  . acetaminophen (TYLENOL) 500 MG tablet Take 1,500 mg by mouth daily as needed.         No current facility-administered medications on file prior to visit.      Review of Systems Review of Systems  Constitutional: Negative for fever, appetite change, fatigue and unexpected weight change.  Eyes: Negative for pain and visual disturbance.  Respiratory: Negative for cough and shortness of breath.   Cardiovascular: Negative for cp or palpitations    Gastrointestinal: Negative for nausea, diarrhea and constipation.  Genitourinary: Negative for urgency and frequency.  Skin: Negative for pallor or rash  MSK pos for L knee pain posteriorly without swelling/ neg for other joint pain  Neurological: Negative for weakness, light-headedness, numbness and headaches.  Hematological: Negative for adenopathy. Does not bruise/bleed easily.  Psychiatric/Behavioral: Negative for dysphoric mood. The patient is not nervous/anxious.          Objective:   Physical Exam  Constitutional: She appears well-nourished.  Eyes: Conjunctivae and EOM are normal. Pupils are equal, round, and reactive to light.  Neck: Normal range of motion.  Cardiovascular: Normal rate and regular rhythm.   Pulmonary/Chest: Effort normal and breath sounds normal.  Musculoskeletal: She exhibits tenderness. She exhibits no  edema.       Left knee: She exhibits decreased range of motion. She exhibits no swelling, no effusion, no ecchymosis, no deformity, no erythema, normal alignment, no LCL laxity, normal patellar mobility, no bony tenderness and normal meniscus. Tenderness found.  Tender in posterior knee - with a bit of fullness  No warmth/ redness /effusion or crepitus Pain to flex over 90 degrees Nl gait  Not unstable No joint line tenderness  Lymphadenopathy:    She has no cervical adenopathy.  Neurological: She is alert. She has normal reflexes. She exhibits normal muscle tone. Coordination normal.  Skin: Skin is warm and dry. No erythema.  Psychiatric: She has a normal mood and affect.          Assessment & Plan:

## 2012-12-06 NOTE — Patient Instructions (Addendum)
Use a cold compress on back of knee when able  Use a knee brace if it helps  Stop up front for an ultrasound referral  Will call with result  Try the meloxicam with food daily as needed

## 2012-12-08 ENCOUNTER — Ambulatory Visit: Payer: Self-pay | Admitting: Family Medicine

## 2012-12-09 ENCOUNTER — Encounter: Payer: Self-pay | Admitting: Family Medicine

## 2012-12-13 ENCOUNTER — Telehealth: Payer: Self-pay | Admitting: Family Medicine

## 2012-12-13 DIAGNOSIS — M25562 Pain in left knee: Secondary | ICD-10-CM

## 2012-12-13 NOTE — Telephone Encounter (Signed)
Message copied by Judy Pimple on Tue Dec 13, 2012 10:04 PM ------      Message from: Shon Millet      Created: Tue Dec 13, 2012  4:51 PM       Pt notified of [place] results and pt advise me that she hasn't had any improvement in her sxs and would like to go ahead with the ortho referral, I advise her that Shirlee Limerick will call her within the next few days to set up appt, please put referral in ------

## 2013-01-20 ENCOUNTER — Ambulatory Visit (INDEPENDENT_AMBULATORY_CARE_PROVIDER_SITE_OTHER): Payer: BC Managed Care – PPO

## 2013-01-20 DIAGNOSIS — Z23 Encounter for immunization: Secondary | ICD-10-CM

## 2013-03-06 ENCOUNTER — Telehealth: Payer: Self-pay | Admitting: Family Medicine

## 2013-03-06 DIAGNOSIS — Z0000 Encounter for general adult medical examination without abnormal findings: Secondary | ICD-10-CM

## 2013-03-06 NOTE — Telephone Encounter (Signed)
Message copied by Judy Pimple on Mon Mar 06, 2013  9:38 PM ------      Message from: Alvina Chou      Created: Fri Mar 03, 2013  9:36 AM      Regarding: Lab orders for Tuesday, 11.25.14       Patient is scheduled for CPX labs, please order future labs, Thanks , Terri       ------

## 2013-03-07 ENCOUNTER — Other Ambulatory Visit (INDEPENDENT_AMBULATORY_CARE_PROVIDER_SITE_OTHER): Payer: BC Managed Care – PPO

## 2013-03-07 DIAGNOSIS — Z0000 Encounter for general adult medical examination without abnormal findings: Secondary | ICD-10-CM

## 2013-03-07 LAB — LIPID PANEL
Cholesterol: 133 mg/dL (ref 0–200)
HDL: 33.9 mg/dL — ABNORMAL LOW (ref >39.00)
LDL Cholesterol: 89 mg/dL (ref 0–99)
VLDL: 10.6 mg/dL (ref 0.0–40.0)

## 2013-03-07 LAB — COMPREHENSIVE METABOLIC PANEL
ALT: 17 U/L (ref 0–35)
Alkaline Phosphatase: 59 U/L (ref 39–117)
CO2: 25 mEq/L (ref 19–32)
Creatinine, Ser: 0.9 mg/dL (ref 0.4–1.2)
GFR: 78.76 mL/min (ref >60.00)
Total Bilirubin: 0.9 mg/dL (ref 0.3–1.2)

## 2013-03-07 LAB — CBC WITH DIFFERENTIAL/PLATELET
Basophils Absolute: 0 10*3/uL (ref 0.0–0.1)
Basophils Relative: 0.7 % (ref 0.0–3.0)
Eosinophils Absolute: 0.1 10*3/uL (ref 0.0–0.7)
HCT: 35.1 % — ABNORMAL LOW (ref 36.0–46.0)
Hemoglobin: 11.5 g/dL — ABNORMAL LOW (ref 12.0–15.0)
Lymphs Abs: 1.5 10*3/uL (ref 0.7–4.0)
MCHC: 32.8 g/dL (ref 30.0–36.0)
MCV: 79.9 fl (ref 78.0–100.0)
Monocytes Absolute: 0.4 10*3/uL (ref 0.1–1.0)
Monocytes Relative: 7.8 % (ref 3.0–12.0)
Neutro Abs: 2.9 10*3/uL (ref 1.4–7.7)
Platelets: 285 10*3/uL (ref 150.0–400.0)
RBC: 4.39 Mil/uL (ref 3.87–5.11)
RDW: 16.1 % — ABNORMAL HIGH (ref 11.5–14.6)

## 2013-03-07 LAB — TSH: TSH: 1.11 u[IU]/mL (ref 0.35–5.50)

## 2013-03-14 ENCOUNTER — Other Ambulatory Visit (HOSPITAL_COMMUNITY)
Admission: RE | Admit: 2013-03-14 | Discharge: 2013-03-14 | Disposition: A | Discharge status: Home / Self Care | Payer: BC Managed Care – PPO | Source: Ambulatory Visit | Attending: Family Medicine | Admitting: Family Medicine

## 2013-03-14 ENCOUNTER — Encounter: Payer: Self-pay | Admitting: Family Medicine

## 2013-03-14 ENCOUNTER — Ambulatory Visit (INDEPENDENT_AMBULATORY_CARE_PROVIDER_SITE_OTHER): Payer: BC Managed Care – PPO | Admitting: Family Medicine

## 2013-03-14 VITALS — BP 100/62 | HR 60 | Temp 98.2°F | Ht 64.25 in | Wt 141.8 lb

## 2013-03-14 DIAGNOSIS — Z23 Encounter for immunization: Secondary | ICD-10-CM

## 2013-03-14 DIAGNOSIS — Z01419 Encounter for gynecological examination (general) (routine) without abnormal findings: Secondary | ICD-10-CM | POA: Insufficient documentation

## 2013-03-14 DIAGNOSIS — Z0000 Encounter for general adult medical examination without abnormal findings: Secondary | ICD-10-CM

## 2013-03-14 DIAGNOSIS — D649 Anemia, unspecified: Secondary | ICD-10-CM

## 2013-03-14 LAB — HM PAP SMEAR: HM Pap smear: NORMAL

## 2013-03-14 NOTE — Patient Instructions (Signed)
Tetanus (Tdap) vaccine today  For low HDL- try to get regular exercise and also try fish oil 1000-3000 mg per day Take care of yourself

## 2013-03-14 NOTE — Progress Notes (Signed)
Pre-visit discussion using our clinic review tool. No additional management support is needed unless otherwise documented below in the visit note.  

## 2013-03-14 NOTE — Progress Notes (Signed)
Subjective:    Patient ID: Courtney Pineda, female    DOB: [birth year], 42 y.o.   MRN: [id number]  HPI Here for health maintenance exam and to review chronic medical problems    Doing ok overall   Knee still bothers her - has not been back to ortho- given nsaid  Very busy at work  $ stress-child getting braces   Wt is up 1 lb with bmi of 24 Trying to take care of herself  Very stressed all the time -- work is bad - no hope for improvement   (though she loves her job) Pt is tearful discussing this - but denies depression   She is getting by -doing ok overall   Td 3/04-will get that today  Due  Flu vaccine 10/14  Pap 10/13 nl - time for that  Periods are regular -some are heavier than others - is tolerable for now    Self breast exam- normal / no lumps or changes Turns 42 this mo - then she will schedule her first mammogram     Chemistry      Component Value Date/Time   NA 140 03/07/2013 0844   K 4.1 03/07/2013 0844   CL 108 03/07/2013 0844   CO2 25 03/07/2013 0844   BUN 13 03/07/2013 0844   CREATININE 0.9 03/07/2013 0844      Component Value Date/Time   CALCIUM 9.1 03/07/2013 0844   ALKPHOS 59 03/07/2013 0844   AST 26 03/07/2013 0844   ALT 17 03/07/2013 0844   BILITOT 0.9 03/07/2013 0844      Lab Results  Component Value Date   WBC 4.9 03/07/2013   HGB 11.5* 03/07/2013   HCT 35.1* 03/07/2013   MCV 79.9 03/07/2013   PLT 285.0 03/07/2013   anemia mild is stable  Lab Results  Component Value Date   TSH 1.11 03/07/2013    Lab Results  Component Value Date   CHOL 133 03/07/2013   CHOL 138 01/26/2012   CHOL 157 01/07/2011   Lab Results  Component Value Date   HDL 33.90* 03/07/2013   HDL 32.60* 01/26/2012   HDL 36.80* 01/07/2011   Lab Results  Component Value Date   LDLCALC 89 03/07/2013   LDLCALC 92 01/26/2012   LDLCALC 105* 01/07/2011   Lab Results  Component Value Date   TRIG 53.0 03/07/2013   TRIG 67.0 01/26/2012   TRIG 76.0 01/07/2011    Lab Results  Component Value Date   CHOLHDL 4 03/07/2013   CHOLHDL 4 01/26/2012   CHOLHDL 4 01/07/2011   No results found for this basename: LDLDIRECT   has never tried fish oil  No time to exercise    Patient Active Problem List   Diagnosis Date Noted  . URI (upper respiratory infection) 05/05/2012  . Left knee pain 02/19/2011  . Routine gynecological examination 01/13/2011  . Anemia, mild 01/13/2011  . Nonspecific elevation of levels of transaminase or lactic acid dehydrogenase (LDH) 01/13/2011  . Routine general medical examination at a health care facility 01/05/2011  . KIDNEY STONE 03/27/2010  . MAMMOGRAM, ABNORMAL, LEFT 01/14/2009  . Nonspecific abnormal electrocardiogram (ECG) (EKG) 05/31/2008  . STRESS REACTION, ACUTE, WITH EMOTIONAL DISTURBANCE 09/13/2007  . HEADACHE 09/13/2007  . NEVUS, ATYPICAL 08/20/2006   Past Medical History  Diagnosis Date  . Kidney stones   . Atypical moles     abnormal moles removed from neck.  . Hyperemesis gravidarum 1999   Past Surgical History  Procedure Laterality Date  .  Tonsillectomy    . Cholecystectomy  06/1994  . Tonsilectomy, adenoidectomy, bilateral myringotomy and tubes      myringotomy tubes  . Lithotripsy  03/2006   History  Substance Use Topics  . Smoking status: Never Smoker   . Smokeless tobacco: Not on file  . Alcohol Use: No   Family History  Problem Relation Age of Onset  . Migraines Mother   . Cancer Paternal Aunt     breast cancer  . Cancer Maternal Grandmother     brain tumor/breast cancer  . Heart disease Maternal Grandfather     heart problems   Allergies  Allergen Reactions  . Sulfonamide Derivatives     REACTION: Lungs close up  . Amoxicillin-Pot Clavulanate     REACTION: rash   Current Outpatient Prescriptions on File Prior to Visit  Medication Sig Dispense Refill  . acetaminophen (TYLENOL) 500 MG tablet Take 1,500 mg by mouth daily as needed.         No current facility-administered  medications on file prior to visit.     Review of Systems Review of Systems  Constitutional: Negative for fever, appetite change, fatigue and unexpected weight change.  Eyes: Negative for pain and visual disturbance.  Respiratory: Negative for cough and shortness of breath.   Cardiovascular: Negative for cp or palpitations    Gastrointestinal: Negative for nausea, diarrhea and constipation.  Genitourinary: Negative for urgency and frequency.  Skin: Negative for pallor or rash   Neurological: Negative for weakness, light-headedness, numbness and headaches.  Hematological: Negative for adenopathy. Does not bruise/bleed easily.  Psychiatric/Behavioral: Negative for dysphoric mood. The patient is not nervous/anxious.  pos for stressors mainly from work        Objective:   Physical Exam  Constitutional: She appears well-developed and well-nourished. No distress.  HENT:  Head: Normocephalic and atraumatic.  Right Ear: External ear normal.  Left Ear: External ear normal.  Nose: Nose normal.  Mouth/Throat: Oropharynx is clear and moist.  Eyes: Conjunctivae and EOM are normal. Pupils are equal, round, and reactive to light. Right eye exhibits no discharge. Left eye exhibits no discharge. No scleral icterus.  Neck: Normal range of motion. Neck supple. No JVD present. No thyromegaly present.  Cardiovascular: Normal rate, regular rhythm, normal heart sounds and intact distal pulses.  Exam reveals no gallop.   Pulmonary/Chest: Effort normal and breath sounds normal. No respiratory distress. She has no wheezes. She has no rales.  Abdominal: Soft. Bowel sounds are normal. She exhibits no distension and no mass. There is no tenderness.  Genitourinary: Vagina normal and uterus normal. No vaginal discharge found.  Musculoskeletal: She exhibits no edema and no tenderness.  Lymphadenopathy:    She has no cervical adenopathy.  Neurological: She is alert. She has normal reflexes. No cranial nerve  deficit. She exhibits normal muscle tone. Coordination normal.  Skin: Skin is warm and dry. No rash noted. No erythema. No pallor.  Psychiatric: She has a normal mood and affect.          Assessment & Plan:

## 2013-03-15 NOTE — Assessment & Plan Note (Signed)
Exam done with pap No reported problems - but if menses get heavier she will let us know

## 2013-03-15 NOTE — Assessment & Plan Note (Signed)
From menses - overall stable  Pt inst that if she feels tired she can try mvi with iron  Offered OC if menses becomes heavier

## 2013-03-15 NOTE — Assessment & Plan Note (Signed)
Reviewed health habits including diet and exercise and skin cancer prevention Reviewed appropriate screening tests for age  Also reviewed health mt list, fam hx and immunization status , as well as social and family history   See HPI Tdap today Labs reviewed  

## 2013-03-30 ENCOUNTER — Ambulatory Visit: Payer: Self-pay | Admitting: Family Medicine

## 2013-03-31 ENCOUNTER — Encounter: Payer: Self-pay | Admitting: Family Medicine

## 2013-04-03 ENCOUNTER — Encounter: Payer: Self-pay | Admitting: *Deleted

## 2013-08-22 ENCOUNTER — Telehealth: Payer: Self-pay | Admitting: Family Medicine

## 2013-08-22 NOTE — Telephone Encounter (Signed)
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[facility])  Pregnant: No  Office Follow Up:  Does the office need to follow up with this patient?: No  Instructions For The Office: N/A  RN Note:  says she would rather be in this office and all appts full today; will continue to use Tylenol today for fever/discomfort  Symptoms  Reason For Call & Symptoms: says she felt cold and achy during the night; had a fever 101.1 at 0430 and took Tylenol; temp at 0630 was 98.8; says she continues to feel cold and bad, but is unable to take her temp at this time; c/o sore throat and rt ear pain  Reviewed Health History In EMR: Yes  Reviewed Medications In EMR: Yes  Reviewed Allergies In EMR: Yes  Reviewed Surgeries / Procedures: Yes  Date of Onset of Symptoms: 08/22/2013 OB / GYN:  LMP: 08/14/2013  Guideline(s) Used:  Earache  Disposition Per Guideline:   See Today in Office  Reason For Disposition Reached:   All other earaches (Exceptions: earache lasting < 1 hour, and earache from air travel)  Advice Given:  N/A  Patient Will Follow Care Advice:  YES  Appointment Scheduled:  08/23/2013 12:15:00 Appointment Scheduled Provider:  Loura Pardon [facility])

## 2013-08-22 NOTE — Telephone Encounter (Signed)
I will see her tomorrow

## 2013-08-23 ENCOUNTER — Telehealth: Payer: Self-pay | Admitting: Family Medicine

## 2013-08-23 ENCOUNTER — Encounter: Payer: Self-pay | Admitting: Family Medicine

## 2013-08-23 ENCOUNTER — Ambulatory Visit (INDEPENDENT_AMBULATORY_CARE_PROVIDER_SITE_OTHER): Payer: BC Managed Care – PPO | Admitting: Family Medicine

## 2013-08-23 VITALS — BP 106/68 | HR 59 | Temp 98.8°F | Ht 64.25 in | Wt 139.2 lb

## 2013-08-23 DIAGNOSIS — J020 Streptococcal pharyngitis: Secondary | ICD-10-CM | POA: Insufficient documentation

## 2013-08-23 DIAGNOSIS — J029 Acute pharyngitis, unspecified: Secondary | ICD-10-CM

## 2013-08-23 LAB — POCT RAPID STREP A (OFFICE): RAPID STREP A SCREEN: POSITIVE — AB

## 2013-08-23 MED ORDER — AZITHROMYCIN 250 MG PO TABS: ORAL_TABLET | ORAL | Status: DC

## 2013-08-23 NOTE — Progress Notes (Signed)
Pre visit review using our clinic review tool, if applicable. No additional management support is needed unless otherwise documented below in the visit note. 

## 2013-08-23 NOTE — Progress Notes (Signed)
Subjective:    Patient ID: Courtney Pineda, female    DOB: 1970-08-31, 43 y.o.   MRN: [id number]  HPI Here with ST and malaise and ear pain  Started yesterday  Chills/aches - 101.1- tmax  Tylenol otc helps some  Throat hurts to swallow  A little nausea   No chance pregnant-finishing period   Results for orders placed in visit on 08/23/13  POCT RAPID STREP A (OFFICE)      Result Value Ref Range   Rapid Strep A Screen Positive (*) Negative    Patient Active Problem List   Diagnosis Date Noted  . Strep pharyngitis 08/23/2013  . URI (upper respiratory infection) 05/05/2012  . Left knee pain 02/19/2011  . Routine gynecological examination 01/13/2011  . Anemia, mild 01/13/2011  . Nonspecific elevation of levels of transaminase or lactic acid dehydrogenase (LDH) 01/13/2011  . Routine general medical examination at a health care facility 01/05/2011  . KIDNEY STONE 03/27/2010  . MAMMOGRAM, ABNORMAL, LEFT 01/14/2009  . Nonspecific abnormal electrocardiogram (ECG) (EKG) 05/31/2008  . STRESS REACTION, ACUTE, WITH EMOTIONAL DISTURBANCE 09/13/2007  . HEADACHE 09/13/2007  . NEVUS, ATYPICAL 08/20/2006   Past Medical History  Diagnosis Date  . Kidney stones   . Atypical moles     abnormal moles removed from neck.  . Hyperemesis gravidarum 1999   Past Surgical History  Procedure Laterality Date  . Tonsillectomy    . Cholecystectomy  06/1994  . Tonsilectomy, adenoidectomy, bilateral myringotomy and tubes      myringotomy tubes  . Lithotripsy  03/2006   History  Substance Use Topics  . Smoking status: Never Smoker   . Smokeless tobacco: Not on file  . Alcohol Use: No   Family History  Problem Relation Age of Onset  . Migraines Mother   . Cancer Paternal Aunt     breast cancer  . Cancer Maternal Grandmother     brain tumor/breast cancer  . Heart disease Maternal Grandfather     heart problems   Allergies  Allergen Reactions  . Sulfonamide Derivatives     REACTION:  Lungs close up  . Amoxicillin-Pot Clavulanate     REACTION: rash   Current Outpatient Prescriptions on File Prior to Visit  Medication Sig Dispense Refill  . acetaminophen (TYLENOL) 500 MG tablet Take 1,500 mg by mouth daily as needed.         No current facility-administered medications on file prior to visit.     Review of Systems Review of Systems  Constitutional: pos for fever and malaise  ENT pos for ST and congestion   Eyes: Negative for pain and visual disturbance.  Respiratory: Negative for cough and shortness of breath.   Cardiovascular: Negative for cp or palpitations    Gastrointestinal: Negative for nausea, diarrhea and constipation.  Genitourinary: Negative for urgency and frequency.  Skin: Negative for pallor or rash   Neurological: Negative for weakness, light-headedness, numbness   Hematological: Negative for adenopathy. Does not bruise/bleed easily.  Psychiatric/Behavioral: Negative for dysphoric mood. The patient is not nervous/anxious.         Objective:   Physical Exam  Constitutional: She appears well-developed and well-nourished. No distress.  HENT:  Head: Normocephalic and atraumatic.  Right Ear: External ear normal.  Left Ear: External ear normal.  Mouth/Throat: Oropharynx is clear and moist. No oropharyngeal exudate.  Nares are boggy Throat is diffusely erythematous without swelling or exudate   Eyes: Conjunctivae and EOM are normal. Pupils are equal, round, and reactive  to light. Right eye exhibits no discharge. Left eye exhibits no discharge. No scleral icterus.  Neck: Normal range of motion. Neck supple.  Cardiovascular: Normal rate and regular rhythm.   Pulmonary/Chest: Effort normal and breath sounds normal. No respiratory distress. She has no wheezes. She has no rales.  Lymphadenopathy:    She has no cervical adenopathy.  Neurological: She is alert.  Skin: Skin is warm and dry. No rash noted.  Psychiatric: She has a normal mood and affect.            Assessment & Plan:

## 2013-08-23 NOTE — Patient Instructions (Signed)
You have strep throat  Drink fluids/gargle with salt water/ continue tylenol Take the zpak as directed  Update if not starting to improve in a week or if worsening    You are contagious  No work until 24 hours after first dose of antibiotic

## 2013-08-23 NOTE — Telephone Encounter (Signed)
Spoke to pt and advised per Dr Glori Bickers; pt verbally expressed understanding

## 2013-08-23 NOTE — Telephone Encounter (Signed)
It could be the strep or a side eff of the antibiotic - continue it and perhaps sucking on sugar free losenges or sour candy will help

## 2013-08-23 NOTE — Telephone Encounter (Signed)
Pt was seen earlier today by Dr Glori Bickers and she was dx'd with strep. Has taken 1st dosage of antibotic. Pt calling with a concern of a severe bad taste in her mouth, very nasty. Pt wanting to know if this could be part of the strep. Please call pt. Thank you!

## 2013-08-24 NOTE — Assessment & Plan Note (Signed)
Cover with zithromax Disc symptomatic care - see instructions on AVS Update if not starting to improve in a week or if worsening   

## 2013-08-25 MED ORDER — CLINDAMYCIN HCL 300 MG PO CAPS: 300.0000 mg | ORAL_CAPSULE | Freq: Three times a day (TID) | ORAL | Status: DC

## 2013-08-25 NOTE — Telephone Encounter (Signed)
How are her other symptoms? Is the bad taste intolerable? Is there any white coating on her tongue?

## 2013-08-25 NOTE — Addendum Note (Signed)
Addended by: Tammi Sou on: 08/25/2013 04:52 PM   Modules accepted: Orders

## 2013-08-25 NOTE — Telephone Encounter (Signed)
I'm going to change her abx to clindamycin (she cannot take pcn or sulfa) Please call in

## 2013-08-25 NOTE — Telephone Encounter (Signed)
If symptoms worsen or do not improve please let me know

## 2013-08-25 NOTE — Telephone Encounter (Signed)
Rx sent to pharmacy and pt notified and advise to update [place] of sxs worsen or don't improve

## 2013-08-25 NOTE — Telephone Encounter (Signed)
Pt said that her other sxs are still the same her throat hurts to swallow and the coughing is still really bad pt said her sxs have not improved since seeing you on 08/23/13. Pt said the taste is really intolerable and when she takes the abx she has abd pain for about 30-45 min, pt said she does have a few white spots in her mouth but her tongue isn't covered in a white coat

## 2013-08-25 NOTE — Addendum Note (Signed)
Addended by: Loura Pardon A on: 08/25/2013 04:49 PM   Modules accepted: Orders, Medications

## 2013-08-25 NOTE — Telephone Encounter (Signed)
Pt left voicemail, she is still having a really bad taste in her mouth, she isn't sure if it a side eff of the abx or due to strep but it is getting worse. Pt has tried sucking on a sugar free losenge and candy and popsicle and nothing is helping, please advise

## 2013-09-07 ENCOUNTER — Telehealth: Payer: Self-pay | Admitting: Family Medicine

## 2013-09-07 NOTE — Telephone Encounter (Signed)
I will see her then  

## 2013-09-07 NOTE — Telephone Encounter (Signed)
Patient Information:  Caller Name: [place]  Phone: [phone]  Patient: Courtney Pineda  Gender: Female  DOB: Sep 04, 1970  Age: 43 Years  PCP: Loura Pardon [facility])  Pregnant: No  Office Follow Up:  Does the office need to follow up with this patient?: No  Instructions For The Office: N/A  RN Note:  Diagnosed with strep throat 08/22/13;  Treated with Zpack for 2 days then Rx changed to Cleocin TID for 10 days due to no symptomatic improvement.  Forgot to take some doses of Cleocin while at work so will finish Rx 09/07/13.  Pain rated 7-8/10.  Declined next available appointment at 1030 with Dr Damita Dunnings.  Transferred to Rose/office scheduler for appointment.   Symptoms  Reason For Call & Symptoms: Recurrent sore throat, tongue feels strange, and ongoing cough.  Reviewed Health History In EMR: Yes  Reviewed Medications In EMR: Yes  Reviewed Allergies In EMR: Yes  Reviewed Surgeries / Procedures: Yes  Date of Onset of Symptoms: 09/04/2013  Treatments Tried: Cleocin TID  Treatments Tried Worked: No OB / GYN:  LMP: 09/01/2013  Guideline(s) Used:  Sore Throat  Disposition Per Guideline:   See Today in Office  Reason For Disposition Reached:   Severe sore throat pain  Advice Given:  For Relief of Sore Throat Pain:  Sip warm chicken broth or apple juice.  Suck on hard candy or a throat lozenge (over-the-counter).  Gargle warm salt water 3 times daily (1 teaspoon of salt in 8 oz or 240 ml of warm water).  Pain Medicines:  For pain relief, you can take either acetaminophen, ibuprofen, or naproxen.  They are over-the-counter (OTC) pain drugs. You can buy them at the drugstore.  Ibuprofen (e.g., Motrin, Advil):  Take 400 mg (two 200 mg pills) by mouth every 6 hours.  Another choice is to take 600 mg (three 200 mg pills) by mouth every 8 hours.  The most you should take each day is 1,200 mg (six 200 mg pills), unless your doctor has told you to take more.  Soft Diet:   Cold  drinks and milk shakes are especially good (Reason: swollen tonsils can make some foods hard to swallow).  Liquids:  Adequate liquid intake is important to prevent dehydration. Drink 6-8 glasses of water per day.  Contagiousness:   You can return to work or school after the fever is gone and you feel well enough to participate in normal activities. If your doctor determines that you have Strep throat, then you will need to take an antibiotic for 24 hours before you can return.  Expected Course:  Sore throats with viral illnesses usually last 3 or 4 days.  Call Back If:  Sore throat is the main symptom and it lasts longer than 24 hours  Sore throat is mild but lasts longer than 4 days  Fever lasts longer than 3 days  You become worse.  Patient Will Follow Care Advice:  YES

## 2013-09-08 ENCOUNTER — Encounter: Payer: Self-pay | Admitting: Family Medicine

## 2013-09-08 ENCOUNTER — Ambulatory Visit (INDEPENDENT_AMBULATORY_CARE_PROVIDER_SITE_OTHER): Payer: BC Managed Care – PPO | Admitting: Family Medicine

## 2013-09-08 VITALS — BP 104/74 | HR 60 | Temp 98.1°F | Ht 64.25 in | Wt 138.5 lb

## 2013-09-08 DIAGNOSIS — J029 Acute pharyngitis, unspecified: Secondary | ICD-10-CM

## 2013-09-08 DIAGNOSIS — B370 Candidal stomatitis: Secondary | ICD-10-CM

## 2013-09-08 LAB — POCT RAPID STREP A (OFFICE): Rapid Strep A Screen: NEGATIVE

## 2013-09-08 MED ORDER — NYSTATIN 100000 UNIT/ML MT SUSP: 5.0000 mL | Freq: Three times a day (TID) | OROMUCOSAL | Status: DC

## 2013-09-08 NOTE — Progress Notes (Signed)
Subjective:    Patient ID: Courtney Pineda, female    DOB: [birth year], 43 y.o.   MRN: [id number]  HPI Pt here for f/u of ST/ with past strep Last abx was clindamycin (did not tolerate zpak) Got better for a while and then ST returned (not as severe) Hurts to swallow- esp acidic things  Tongue is sensitive with a brown coating on it (non smoker)  Finished her clindamycin   Strep test today is negative  No fever  No other symptoms  No prior hx of thrush   Results for orders placed in visit on 09/08/13  POCT RAPID STREP A (OFFICE)      Result Value Ref Range   Rapid Strep A Screen Negative  Negative     Patient Active Problem List   Diagnosis Date Noted  . Strep pharyngitis 08/23/2013  . URI (upper respiratory infection) 05/05/2012  . Left knee pain 02/19/2011  . Routine gynecological examination 01/13/2011  . Anemia, mild 01/13/2011  . Nonspecific elevation of levels of transaminase or lactic acid dehydrogenase (LDH) 01/13/2011  . Routine general medical examination at a health care facility 01/05/2011  . KIDNEY STONE 03/27/2010  . MAMMOGRAM, ABNORMAL, LEFT 01/14/2009  . Nonspecific abnormal electrocardiogram (ECG) (EKG) 05/31/2008  . STRESS REACTION, ACUTE, WITH EMOTIONAL DISTURBANCE 09/13/2007  . HEADACHE 09/13/2007  . NEVUS, ATYPICAL 08/20/2006   Past Medical History  Diagnosis Date  . Kidney stones   . Atypical moles     abnormal moles removed from neck.  . Hyperemesis gravidarum 1999   Past Surgical History  Procedure Laterality Date  . Tonsillectomy    . Cholecystectomy  06/1994  . Tonsilectomy, adenoidectomy, bilateral myringotomy and tubes      myringotomy tubes  . Lithotripsy  03/2006   History  Substance Use Topics  . Smoking status: Never Smoker   . Smokeless tobacco: Not on file  . Alcohol Use: No   Family History  Problem Relation Age of Onset  . Migraines Mother   . Cancer Paternal Aunt     breast cancer  . Cancer Maternal Grandmother       brain tumor/breast cancer  . Heart disease Maternal Grandfather     heart problems   Allergies  Allergen Reactions  . Sulfonamide Derivatives     REACTION: Lungs close up  . Amoxicillin-Pot Clavulanate     REACTION: rash   Current Outpatient Prescriptions on File Prior to Visit  Medication Sig Dispense Refill  . acetaminophen (TYLENOL) 500 MG tablet Take 1,500 mg by mouth daily as needed.        . clindamycin (CLEOCIN) 300 MG capsule Take 1 capsule (300 mg total) by mouth 3 (three) times daily.  30 capsule  0   No current facility-administered medications on file prior to visit.     Review of Systems Review of Systems  Constitutional: Negative for fever, appetite change, fatigue and unexpected weight change.  Eyes: Negative for pain and visual disturbance.  ENT pos for throat and mouth pain /neg for nasal symptoms or sinus pain  Respiratory: Negative for cough and shortness of breath.   Cardiovascular: Negative for cp or palpitations    Gastrointestinal: Negative for nausea, diarrhea and constipation.  Genitourinary: Negative for urgency and frequency.  Skin: Negative for pallor or rash   Neurological: Negative for weakness, light-headedness, numbness and headaches.  Hematological: Negative for adenopathy. Does not bruise/bleed easily.  Psychiatric/Behavioral: Negative for dysphoric mood. The patient is not nervous/anxious.  Objective:   Physical Exam  Constitutional: She appears well-developed and well-nourished. No distress.  HENT:  Head: Normocephalic and atraumatic.  Right Ear: External ear normal.  Left Ear: External ear normal.  Nose: Nose normal.  Mouth/Throat: No oropharyngeal exudate.  tonuge has a tan to white non scrapable coating  Throat -mild post erythema without swelling/ lesions or exudate   Eyes: Conjunctivae and EOM are normal. Pupils are equal, round, and reactive to light. Right eye exhibits no discharge. Left eye exhibits no discharge. No  scleral icterus.  Neck: Normal range of motion. Neck supple.  Cardiovascular: Normal rate and regular rhythm.   Pulmonary/Chest: Effort normal and breath sounds normal. No respiratory distress. She has no wheezes. She has no rales.  Musculoskeletal: She exhibits no edema and no tenderness.  No acute joint changes   Lymphadenopathy:    She has no cervical adenopathy.  Neurological: She is alert.  Skin: Skin is warm and dry. No rash noted. No pallor.  Psychiatric: She has a normal mood and affect.          Assessment & Plan:

## 2013-09-08 NOTE — Progress Notes (Signed)
Pre visit review using our clinic review tool, if applicable. No additional management support is needed unless otherwise documented below in the visit note. 

## 2013-09-08 NOTE — Patient Instructions (Signed)
I think you have thrush-this can affect mouth and throat  Use the nystatin mouthwash (swish and swallow) three times daily I am also sending a throat culture   Update if not starting to improve in a week or if worsening

## 2013-09-10 LAB — CULTURE, GROUP A STREP: Organism ID, Bacteria: NORMAL

## 2013-09-10 NOTE — Assessment & Plan Note (Signed)
Also poss throat  S/p abx  tx with nystatin oral solun- tid swish and swallow Update if not starting to improve in a week or if worsening

## 2013-09-10 NOTE — Assessment & Plan Note (Signed)
S/p tx for strep  Symptoms resolved and returned Neg strep test and no fever or other symptoms  Thrush on tongue-this may be the cause Throat cx done

## 2013-09-12 ENCOUNTER — Telehealth: Payer: Self-pay | Admitting: Family Medicine

## 2013-09-12 DIAGNOSIS — R208 Other disturbances of skin sensation: Secondary | ICD-10-CM

## 2013-09-12 DIAGNOSIS — J029 Acute pharyngitis, unspecified: Secondary | ICD-10-CM

## 2013-09-12 NOTE — Telephone Encounter (Signed)
Message copied by Abner Greenspan on Tue Sep 12, 2013  5:27 PM ------      Message from: Tammi Sou      Created: Tue Sep 12, 2013  4:55 PM       Pt does want to see ENT, please put referral in, I advised pt that Marion/Linda will call to schedule an appt ------

## 2013-09-12 NOTE — Telephone Encounter (Signed)
Ref to ENT for throat and mouth pain

## 2014-01-26 ENCOUNTER — Ambulatory Visit (INDEPENDENT_AMBULATORY_CARE_PROVIDER_SITE_OTHER): Payer: BC Managed Care – PPO

## 2014-01-26 DIAGNOSIS — Z23 Encounter for immunization: Secondary | ICD-10-CM

## 2014-04-02 ENCOUNTER — Ambulatory Visit: Payer: Self-pay | Admitting: Family Medicine

## 2014-04-04 ENCOUNTER — Encounter: Payer: Self-pay | Admitting: Family Medicine

## 2014-04-16 ENCOUNTER — Ambulatory Visit: Payer: Self-pay | Admitting: Family Medicine

## 2014-04-16 ENCOUNTER — Encounter: Payer: Self-pay | Admitting: Family Medicine

## 2014-04-17 ENCOUNTER — Encounter: Payer: Self-pay | Admitting: *Deleted

## 2014-05-15 ENCOUNTER — Telehealth: Payer: Self-pay | Admitting: Family Medicine

## 2014-05-15 DIAGNOSIS — Z0000 Encounter for general adult medical examination without abnormal findings: Secondary | ICD-10-CM

## 2014-05-15 NOTE — Telephone Encounter (Signed)
-----   Message from Ellamae Sia sent at 05/10/2014  4:13 PM EST ----- Regarding: Lab orders for Wednesday, 2.3.16 Patient is scheduled for CPX labs, please order future labs, Thanks , Karna Christmas

## 2014-05-16 ENCOUNTER — Other Ambulatory Visit: Payer: Self-pay | Admitting: *Deleted

## 2014-05-16 ENCOUNTER — Other Ambulatory Visit (INDEPENDENT_AMBULATORY_CARE_PROVIDER_SITE_OTHER): Payer: BLUE CROSS/BLUE SHIELD

## 2014-05-16 DIAGNOSIS — Z0000 Encounter for general adult medical examination without abnormal findings: Secondary | ICD-10-CM

## 2014-05-16 LAB — CBC WITH DIFFERENTIAL/PLATELET
Basophils Absolute: 0 10*3/uL (ref 0.0–0.1)
Basophils Relative: 1 % (ref 0.0–3.0)
EOS PCT: 2.3 % (ref 0.0–5.0)
Eosinophils Absolute: 0.1 10*3/uL (ref 0.0–0.7)
HCT: 33.2 % — ABNORMAL LOW (ref 36.0–46.0)
Hemoglobin: 11 g/dL — ABNORMAL LOW (ref 12.0–15.0)
LYMPHS ABS: 1.6 10*3/uL (ref 0.7–4.0)
LYMPHS PCT: 32.9 % (ref 12.0–46.0)
MCHC: 33.2 g/dL (ref 30.0–36.0)
MCV: 79.5 fl (ref 78.0–100.0)
Monocytes Absolute: 0.3 10*3/uL (ref 0.1–1.0)
Monocytes Relative: 7.2 % (ref 3.0–12.0)
NEUTROS ABS: 2.7 10*3/uL (ref 1.4–7.7)
Neutrophils Relative %: 56.6 % (ref 43.0–77.0)
Platelets: 279 10*3/uL (ref 150.0–400.0)
RBC: 4.18 Mil/uL (ref 3.87–5.11)
RDW: 15.8 % — ABNORMAL HIGH (ref 11.5–15.5)
WBC: 4.8 10*3/uL (ref 4.0–10.5)

## 2014-05-16 LAB — LIPID PANEL
CHOL/HDL RATIO: 4
Cholesterol: 138 mg/dL (ref 0–200)
HDL: 34.2 mg/dL — AB (ref >39.00)
LDL CALC: 85 mg/dL (ref 0–99)
NonHDL: 103.8
Triglycerides: 92 mg/dL (ref 0.0–149.0)
VLDL: 18.4 mg/dL (ref 0.0–40.0)

## 2014-05-16 LAB — TSH: TSH: 1.88 u[IU]/mL (ref 0.35–4.50)

## 2014-05-16 LAB — COMPREHENSIVE METABOLIC PANEL
ALBUMIN: 4.1 g/dL (ref 3.5–5.2)
ALT: 15 U/L (ref 0–35)
AST: 23 U/L (ref 0–37)
Alkaline Phosphatase: 70 U/L (ref 39–117)
BUN: 13 mg/dL (ref 6–23)
CALCIUM: 9.2 mg/dL (ref 8.4–10.5)
CHLORIDE: 108 meq/L (ref 96–112)
CO2: 29 mEq/L (ref 19–32)
CREATININE: 0.8 mg/dL (ref 0.40–1.20)
GFR: 83.96 mL/min (ref >60.00)
Glucose, Bld: 89 mg/dL (ref 70–99)
POTASSIUM: 4.1 meq/L (ref 3.5–5.1)
Sodium: 139 mEq/L (ref 135–145)
TOTAL PROTEIN: 6.7 g/dL (ref 6.0–8.3)
Total Bilirubin: 0.7 mg/dL (ref 0.2–1.2)

## 2014-05-16 NOTE — Telephone Encounter (Deleted)
Faxed in [place] refill authorization.

## 2014-05-16 NOTE — Telephone Encounter (Signed)
Enter in error

## 2014-05-23 ENCOUNTER — Other Ambulatory Visit (HOSPITAL_COMMUNITY)
Admission: RE | Admit: 2014-05-23 | Discharge: 2014-05-23 | Disposition: A | Discharge status: Home / Self Care | Payer: BLUE CROSS/BLUE SHIELD | Source: Ambulatory Visit | Attending: Family Medicine | Admitting: Family Medicine

## 2014-05-23 ENCOUNTER — Encounter: Payer: Self-pay | Admitting: Family Medicine

## 2014-05-23 ENCOUNTER — Ambulatory Visit (INDEPENDENT_AMBULATORY_CARE_PROVIDER_SITE_OTHER): Payer: BLUE CROSS/BLUE SHIELD | Admitting: Family Medicine

## 2014-05-23 VITALS — BP 106/70 | HR 97 | Temp 98.0°F | Ht 64.0 in | Wt 143.0 lb

## 2014-05-23 DIAGNOSIS — Z01419 Encounter for gynecological examination (general) (routine) without abnormal findings: Secondary | ICD-10-CM | POA: Diagnosis not present

## 2014-05-23 DIAGNOSIS — N941 Dyspareunia: Secondary | ICD-10-CM

## 2014-05-23 DIAGNOSIS — N760 Acute vaginitis: Secondary | ICD-10-CM | POA: Diagnosis present

## 2014-05-23 DIAGNOSIS — Z0000 Encounter for general adult medical examination without abnormal findings: Secondary | ICD-10-CM

## 2014-05-23 DIAGNOSIS — IMO0002 Reserved for concepts with insufficient information to code with codable children: Secondary | ICD-10-CM | POA: Insufficient documentation

## 2014-05-23 DIAGNOSIS — D649 Anemia, unspecified: Secondary | ICD-10-CM

## 2014-05-23 NOTE — Patient Instructions (Addendum)
Try a multivitamin with iron to boost your hemoglobin (a children's vitamin is fine)  Exercise and omega 3 fish oil helps boost HDL cholesterol   Pap sent with a test for vaginitis  use a lubricant for intercourse like KY jelly  If symptoms worsen let me know

## 2014-05-23 NOTE — Progress Notes (Signed)
   Subjective:    Patient ID: Courtney Pineda, female    DOB: 01-07-1971, 44 y.o.   MRN: [id number]  HPI  Here for health maintenance exam and to review chronic medical problems    Has been doing well overall   Wt is up 4 lb with bmi of 24  HIV screen - does not desire this , not high risk   Flu shot 10/15 Mammogram 1/16 nl - final result was nl after a call back for addn views  Self exam no lump   Pap 12/14 normal Menses - varies -some periods are heavier than others 5-7 days in length  occ dysparunia - wonders if she has some vaginal dryness  Monogamous -does not suspect stds  Due for her pap and exam   Td 12/14   Anemia Lab Results  Component Value Date   WBC 4.8 05/16/2014   HGB 11.0* 05/16/2014   HCT 33.2* 05/16/2014   MCV 79.5 05/16/2014   PLT 279.0 05/16/2014   Hb is down from 11.5 - last period was heavy  Fairly stable   Lipids Lab Results  Component Value Date   CHOL 138 05/16/2014   CHOL 133 03/07/2013   CHOL 138 01/26/2012   Lab Results  Component Value Date   HDL 34.20* 05/16/2014   HDL 33.90* 03/07/2013   HDL 32.60* 01/26/2012   Lab Results  Component Value Date   LDLCALC 85 05/16/2014   LDLCALC 89 03/07/2013   LDLCALC 92 01/26/2012   Lab Results  Component Value Date   TRIG 92.0 05/16/2014   TRIG 53.0 03/07/2013   TRIG 67.0 01/26/2012   Lab Results  Component Value Date   CHOLHDL 4 05/16/2014   CHOLHDL 4 03/07/2013   CHOLHDL 4 01/26/2012   No results found for: LDLDIRECT Good cholesterol is too low  No regular exercise (too busy with family)  Has not tried fish oil yet      Chemistry      Component Value Date/Time   NA 139 05/16/2014 0838   K 4.1 05/16/2014 0838   CL 108 05/16/2014 0838   CO2 29 05/16/2014 0838   BUN 13 05/16/2014 0838   CREATININE 0.80 05/16/2014 0838      Component Value Date/Time   CALCIUM 9.2 05/16/2014 0838   ALKPHOS 70 05/16/2014 0838   AST 23 05/16/2014 0838   ALT 15 05/16/2014 0838   BILITOT 0.7 05/16/2014 0838     Lab Results  Component Value Date   TSH 1.88 05/16/2014       Review of Systems     Objective:   Physical Exam        Assessment & Plan:

## 2014-05-23 NOTE — Progress Notes (Signed)
Pre visit review using our clinic review tool, if applicable. No additional management support is needed unless otherwise documented below in the visit note. 

## 2014-05-24 LAB — CYTOLOGY - PAP

## 2014-05-24 NOTE — Assessment & Plan Note (Signed)
Likely due to menses Enc trial of mvi with iron (could try a children's formula as well)

## 2014-05-24 NOTE — Assessment & Plan Note (Signed)
Reviewed health habits including diet and exercise and skin cancer prevention Reviewed appropriate screening tests for age  Also reviewed health mt list, fam hx and immunization status , as well as social and family history   See HPI Labs reviewed  Enc to start mvi with iron for mild anemia

## 2014-05-24 NOTE — Assessment & Plan Note (Signed)
Unsure of cause  Nl exam  incl check for gardenarella and candida with pap  Denies exp to STD and declines STD screen

## 2014-05-24 NOTE — Assessment & Plan Note (Signed)
Routine exam with pap  Nl exam  Some c/o of dyspareunia -no tenderness on bimanual exam

## 2014-05-25 ENCOUNTER — Encounter: Payer: Self-pay | Admitting: *Deleted

## 2014-05-25 LAB — CERVICOVAGINAL ANCILLARY ONLY
BACTERIAL VAGINITIS: NEGATIVE
Candida vaginitis: NEGATIVE

## 2014-05-28 ENCOUNTER — Encounter: Payer: Self-pay | Admitting: *Deleted

## 2014-07-05 NOTE — Progress Notes (Signed)
   Subjective:    Patient ID: Courtney Pineda, female    DOB: 1971-04-01, 44 y.o.   MRN: [id number]  HPI    Review of Systems     Objective:   Physical Exam  Constitutional: She appears well-developed and well-nourished. No distress.  HENT:  Head: Normocephalic and atraumatic.  Right Ear: External ear normal.  Left Ear: External ear normal.  Nose: Nose normal.  Mouth/Throat: Oropharynx is clear and moist.  Eyes: Conjunctivae and EOM are normal. Pupils are equal, round, and reactive to light. Right eye exhibits no discharge. Left eye exhibits no discharge. No scleral icterus.  Neck: Normal range of motion. Neck supple. No JVD present. Carotid bruit is not present. No thyromegaly present.  Cardiovascular: Normal rate, regular rhythm, normal heart sounds and intact distal pulses.  Exam reveals no gallop.   Pulmonary/Chest: Effort normal and breath sounds normal. No respiratory distress. She has no wheezes. She has no rales.  Abdominal: Soft. Bowel sounds are normal. She exhibits no distension and no mass. There is no tenderness.  Genitourinary: There is no rash, tenderness or lesion on the right labia. There is no rash, tenderness or lesion on the left labia. Uterus is not enlarged and not tender. Cervix exhibits no motion tenderness, no discharge and no friability. Right adnexum displays no mass, no tenderness and no fullness. Left adnexum displays no mass, no tenderness and no fullness. No erythema, tenderness or bleeding in the vagina. No vaginal discharge found.  Breast exam: No mass, nodules, thickening, tenderness, bulging, retraction, inflamation, nipple discharge or skin changes noted.  No axillary or clavicular LA.      Musculoskeletal: She exhibits no edema or tenderness.  Lymphadenopathy:    She has no cervical adenopathy.  Neurological: She is alert. She has normal reflexes. No cranial nerve deficit. She exhibits normal muscle tone. Coordination normal.  Skin: Skin is  warm and dry. No rash noted. No erythema. No pallor.  Psychiatric: She has a normal mood and affect.          Assessment & Plan:

## 2014-07-09 ENCOUNTER — Encounter: Payer: Self-pay | Admitting: Family Medicine

## 2014-07-09 ENCOUNTER — Ambulatory Visit (INDEPENDENT_AMBULATORY_CARE_PROVIDER_SITE_OTHER): Payer: BLUE CROSS/BLUE SHIELD | Admitting: Family Medicine

## 2014-07-09 VITALS — BP 102/72 | HR 85 | Temp 98.9°F | Ht 64.0 in | Wt 142.4 lb

## 2014-07-09 DIAGNOSIS — J0100 Acute maxillary sinusitis, unspecified: Secondary | ICD-10-CM | POA: Diagnosis not present

## 2014-07-09 DIAGNOSIS — J069 Acute upper respiratory infection, unspecified: Secondary | ICD-10-CM | POA: Diagnosis not present

## 2014-07-09 MED ORDER — AZITHROMYCIN 250 MG PO TABS: ORAL_TABLET | ORAL | Status: DC

## 2014-07-09 MED ORDER — HYDROCODONE-HOMATROPINE 5-1.5 MG/5ML PO SYRP: ORAL_SOLUTION | ORAL | Status: DC

## 2014-07-09 NOTE — Progress Notes (Signed)
   Dr. Frederico Hamman T. Robi Dewolfe, MD, [facility] [place], [address] Phone: [phone] Fax: [phone]  07/09/2014  Patient: Courtney Pineda, MRN: [id number], DOB: 12-Nov-1970, 44 y.o.  Primary Physician:  Loura Pardon, MD  Chief Complaint: Sinusitis  Subjective:   Courtney Pineda is a 45 y.o. very pleasant female patient who presents with the following:  Son and husband were sick.  Some was sick, and he got a fever. She started to get sick for a few days.  Now she feels terrible. She is not really having a fever. She has pain in her maxillary sinus region. She also has purulent colored mucus coming from her nasal passages, she is coughing quite a bit.  She also has some left greater than right injection in her conjunctiva.  Past Medical History, Surgical History, Social History, Family History, Problem List, Medications, and Allergies have been reviewed and updated if relevant.  ROS: GEN: Acute illness details above GI: Tolerating PO intake GU: maintaining adequate hydration and urination Pulm: No SOB Interactive and getting along well at home.  Otherwise, ROS is as per the HPI.   Objective:   BP 102/72 mmHg  Pulse 85  Temp(Src) 98.9 F (37.2 C) (Oral)  Ht 5\' 4"  (1.626 m)  Wt 142 lb 6.4 oz (64.592 kg)  BMI 24.43 kg/m2  SpO2 96%   Gen: WDWN, NAD; alert,appropriate and cooperative throughout exam PERRLA, EOMI. Injected L conjunctiva, no matter. HEENT: Normocephalic and atraumatic. Throat clear, w/o exudate, no LAD, R TM clear, L TM - good landmarks, No fluid present. rhinnorhea.  Left frontal and maxillary sinuses: Tender max Right frontal and maxillary sinuses: Tender max  Neck: No ant or post LAD CV: RRR, No M/G/R Pulm: Breathing comfortably in no resp distress. no w/c/r Abd: S,NT,ND,+BS Extr: no c/c/e Psych: full affect, pleasant    Laboratory and Imaging Data:  Assessment and Plan:    Acute maxillary sinusitis, recurrence not specified  Acute upper respiratory infection  Probable preceding URI. Treat as such. She may have a concomitant viral conjunctivitis. Reassurance.  Follow-up: No Follow-up on file.  New Prescriptions   AZITHROMYCIN (ZITHROMAX) 250 MG TABLET    2 tabs po on day 1, then 1 tab po for 4 days   HYDROCODONE-HOMATROPINE (HYCODAN) 5-1.5 MG/5ML SYRUP    1 tsp po at night before bed prn cough   No orders of the defined types were placed in this encounter.    Signed,  Maud Deed. Anasha Perfecto, MD   Patient's Medications  New Prescriptions   AZITHROMYCIN (ZITHROMAX) 250 MG TABLET    2 tabs po on day 1, then 1 tab po for 4 days   HYDROCODONE-HOMATROPINE (HYCODAN) 5-1.5 MG/5ML SYRUP    1 tsp po at night before bed prn cough  Previous Medications   PSEUDOEPHEDRINE (SUDAFED) 60 MG TABLET    Take 60 mg by mouth every 4 (four) hours as needed for congestion.  Modified Medications   No medications on file  Discontinued Medications   ACETAMINOPHEN (TYLENOL) 500 MG TABLET    Take 1,500 mg by mouth daily as needed.     CLINDAMYCIN (CLEOCIN) 300 MG CAPSULE    Take 1 capsule (300 mg total) by mouth 3 (three) times daily.   NYSTATIN (MYCOSTATIN) 100000 UNIT/ML SUSPENSION    Take 5 mLs (500,000 Units total) by mouth 3 (three) times daily.

## 2014-07-09 NOTE — Progress Notes (Signed)
Pre visit review using our clinic review tool, if applicable. No additional management support is needed unless otherwise documented below in the visit note. 

## 2015-01-25 ENCOUNTER — Ambulatory Visit (INDEPENDENT_AMBULATORY_CARE_PROVIDER_SITE_OTHER): Payer: BLUE CROSS/BLUE SHIELD

## 2015-01-25 DIAGNOSIS — Z23 Encounter for immunization: Secondary | ICD-10-CM | POA: Diagnosis not present

## 2015-02-26 ENCOUNTER — Telehealth: Payer: Self-pay | Admitting: Family Medicine

## 2015-02-26 NOTE — Telephone Encounter (Signed)
[facility]  Patient Name: Courtney Pineda  DOB: [birth year]    Initial Comment Caller States has question about her menstrual cycle.    Nurse Assessment  Nurse: Wynetta Emery, RN, Baker Janus Date/Time Eilene Ghazi Time): 02/26/2015 10:30:23 AM  Confirm and document reason for call. If symptomatic, describe symptoms. ---Ahnesty is usually regular on her menses and she has not started can't remember the date of last period in October. having mild cramps like she is going to start but no flow.  Has the patient traveled out of the country within the last 30 days? ---No  Does the patient have any new or worsening symptoms? ---Yes  Will a triage be completed? ---Yes  Related visit to physician within the last 2 weeks? ---No  Does the PT have any chronic conditions? (i.e. diabetes, asthma, etc.) ---No  Did the patient indicate they were pregnant? ---No     Guidelines    Guideline Title Affirmed Question Affirmed Notes  Menstrual Period - Missed or Late Age > 40 years    Final Disposition User   See PCP within 2 Varney Biles, RN, Baker Janus    Comments  NOTE; appt scheduled for 03-01-2015 315pm [place], Roque Lias c/o irregular menses ? premenapuasal   Referrals  REFERRED TO PCP OFFICE   Disagree/Comply: Comply

## 2015-02-26 NOTE — Telephone Encounter (Signed)
I will see her then  

## 2015-02-26 NOTE — Telephone Encounter (Signed)
Pt has appt 03/01/15 with Dr Glori Bickers.

## 2015-03-01 ENCOUNTER — Ambulatory Visit (INDEPENDENT_AMBULATORY_CARE_PROVIDER_SITE_OTHER): Payer: BLUE CROSS/BLUE SHIELD | Admitting: Family Medicine

## 2015-03-01 ENCOUNTER — Encounter: Payer: Self-pay | Admitting: Family Medicine

## 2015-03-01 VITALS — BP 108/68 | HR 60 | Temp 98.1°F | Wt 148.2 lb

## 2015-03-01 DIAGNOSIS — N926 Irregular menstruation, unspecified: Secondary | ICD-10-CM

## 2015-03-01 NOTE — Patient Instructions (Signed)
For period cramping and heavy flow take 2 aleve with food up to every 12 hours  See how things go over the next 2-3 months  If no improvement - we will put you on an oral contraceptive  Otherwise - will do exam as planned in Feb

## 2015-03-01 NOTE — Progress Notes (Signed)
Pre visit review using our clinic review tool, if applicable. No additional management support is needed unless otherwise documented below in the visit note. 

## 2015-03-01 NOTE — Progress Notes (Signed)
Subjective:    Patient ID: Courtney Pineda, female    DOB: January 31, 1971, 44 y.o.   MRN: [id number]  HPI Here with irreg menses  August was normal - was on a cruise (aug 5th) Sept the same (early sept)   October - had a period in beginning of Oct and then 1-2 wk later she started spotting again   A lot of stuff going on /really busy non stop    This month - had not had a period yet - but then pressure and cramping Then started on Wed  Now heavy bleeding and some cramping - much worse than usual   husb has had vasectomy   Very busy with little time for self care   Wt is up 5-6 lb - bmi is good 25    Patient Active Problem List   Diagnosis Date Noted  . Irregular menses 03/01/2015  . Encounter for routine gynecological examination 05/23/2014  . Dyspareunia 05/23/2014  . Burning sensation of mouth 09/12/2013  . Left knee pain 02/19/2011  . Anemia, mild 01/13/2011  . Nonspecific elevation of levels of transaminase or lactic acid dehydrogenase (LDH) 01/13/2011  . Routine general medical examination at a health care facility 01/05/2011  . KIDNEY STONE 03/27/2010  . MAMMOGRAM, ABNORMAL, LEFT 01/14/2009  . Nonspecific abnormal electrocardiogram (ECG) (EKG) 05/31/2008  . STRESS REACTION, ACUTE, WITH EMOTIONAL DISTURBANCE 09/13/2007  . HEADACHE 09/13/2007  . NEVUS, ATYPICAL 08/20/2006   Past Medical History  Diagnosis Date  . Kidney stones   . Atypical moles     abnormal moles removed from neck.  . Hyperemesis gravidarum 1999   Past Surgical History  Procedure Laterality Date  . Tonsillectomy    . Cholecystectomy  06/1994  . Tonsilectomy, adenoidectomy, bilateral myringotomy and tubes      myringotomy tubes  . Lithotripsy  03/2006   Social History  Substance Use Topics  . Smoking status: Never Smoker   . Smokeless tobacco: None  . Alcohol Use: No   Family History  Problem Relation Age of Onset  . Migraines Mother   . Cancer Paternal Aunt     breast  cancer  . Cancer Maternal Grandmother     brain tumor/breast cancer  . Heart disease Maternal Grandfather     heart problems   Allergies  Allergen Reactions  . Sulfonamide Derivatives     REACTION: Lungs close up  . Amoxicillin-Pot Clavulanate     REACTION: rash   No current outpatient prescriptions on file prior to visit.   No current facility-administered medications on file prior to visit.    Review of Systems Review of Systems  Constitutional: Negative for fever, appetite change, fatigue and unexpected weight change.  Eyes: Negative for pain and visual disturbance.  Respiratory: Negative for cough and shortness of breath.   Cardiovascular: Negative for cp or palpitations    Gastrointestinal: Negative for nausea, diarrhea and constipation.  Genitourinary: Negative for urgency and frequency. pos for irreg heavy menses/ neg for vaginitis symptoms  Skin: Negative for pallor or rash   Neurological: Negative for weakness, light-headedness, numbness and headaches.  Hematological: Negative for adenopathy. Does not bruise/bleed easily.  Psychiatric/Behavioral: Negative for dysphoric mood. The patient is not nervous/anxious.         Objective:   Physical Exam  Constitutional: She appears well-developed and well-nourished. No distress.  Well appearing   HENT:  Head: Normocephalic and atraumatic.  Eyes: Conjunctivae and EOM are normal. Pupils are equal, round, and reactive to  light.  Neck: Normal range of motion. Neck supple.  Cardiovascular: Normal rate, regular rhythm and normal heart sounds.   Pulmonary/Chest: Effort normal and breath sounds normal.  Abdominal: Soft. Bowel sounds are normal. She exhibits no distension. There is tenderness. There is no rebound.  No cva tenderness  Mild suprapubic tenderness without fullness or mass  Musculoskeletal: She exhibits no edema.  Lymphadenopathy:    She has no cervical adenopathy.  Neurological: She is alert.  Skin: No rash  noted. No pallor.  Psychiatric: She has a normal mood and affect.          Assessment & Plan:   Problem List Items Addressed This Visit      Other   Irregular menses - Primary    The last several months-now and unusually heavy period with cramps  Suspect this could be multifactorial Her period has changed to synch up with her daughter's, also much stress lately and disc poss of some age rel/ poss perimenopausal change Will try otc naproxen with food bid now for pain and bleeding Update if no imp  If problems persist will disc OC (even short term) Of note-husband had a vasectomy

## 2015-03-03 NOTE — Assessment & Plan Note (Signed)
The last several months-now and unusually heavy period with cramps  Suspect this could be multifactorial Her period has changed to synch up with her daughter's, also much stress lately and disc poss of some age rel/ poss perimenopausal change Will try otc naproxen with food bid now for pain and bleeding Update if no imp  If problems persist will disc OC (even short term) Of note-husband had a vasectomy

## 2015-05-08 ENCOUNTER — Encounter: Payer: Self-pay | Admitting: Pediatric Endocrinology

## 2015-05-10 ENCOUNTER — Telehealth: Payer: Self-pay

## 2015-05-10 ENCOUNTER — Telehealth: Payer: Self-pay | Admitting: Family Medicine

## 2015-05-10 MED ORDER — MALATHION 0.5 % EX LOTN: TOPICAL_LOTION | Freq: Once | CUTANEOUS | Status: DC

## 2015-05-10 NOTE — Telephone Encounter (Signed)
This is what I'm using for resistant head lice - would she be open to trying another pharmacy to see if they have it?

## 2015-05-10 NOTE — Telephone Encounter (Signed)
Rx called in to pharmacy. 

## 2015-05-10 NOTE — Telephone Encounter (Signed)
Pt left v/m requesting rx for treatment of head lice; OTC meds not working. Pt request cb. [facility] [place].

## 2015-05-10 NOTE — Telephone Encounter (Signed)
Pt left v/m; the head lice medication will not be in until Monday and pt wants to know if different med can be sent to [facility] to get started on med today.

## 2015-05-10 NOTE — Telephone Encounter (Signed)
Please call in ovide  inst are too long to send electronically- sorry  F/u if no improvement

## 2015-05-10 NOTE — Telephone Encounter (Signed)
See prev phone note

## 2015-05-18 ENCOUNTER — Telehealth: Payer: Self-pay | Admitting: Family Medicine

## 2015-05-18 DIAGNOSIS — Z0000 Encounter for general adult medical examination without abnormal findings: Secondary | ICD-10-CM

## 2015-05-18 NOTE — Telephone Encounter (Signed)
-----   Message from Ellamae Sia sent at 05/13/2015 10:07 AM EST ----- Regarding: Lab orders for Monday, 2.6.17 Patient is scheduled for CPX labs, please order future labs, Thanks , Karna Christmas

## 2015-05-20 ENCOUNTER — Other Ambulatory Visit (INDEPENDENT_AMBULATORY_CARE_PROVIDER_SITE_OTHER): Payer: BLUE CROSS/BLUE SHIELD

## 2015-05-20 DIAGNOSIS — Z0000 Encounter for general adult medical examination without abnormal findings: Secondary | ICD-10-CM

## 2015-05-20 LAB — CBC WITH DIFFERENTIAL/PLATELET
BASOS PCT: 0.7 % (ref 0.0–3.0)
Basophils Absolute: 0 10*3/uL (ref 0.0–0.1)
EOS ABS: 0.1 10*3/uL (ref 0.0–0.7)
Eosinophils Relative: 1.9 % (ref 0.0–5.0)
HCT: 35.3 % — ABNORMAL LOW (ref 36.0–46.0)
Hemoglobin: 11.3 g/dL — ABNORMAL LOW (ref 12.0–15.0)
Lymphocytes Relative: 31.5 % (ref 12.0–46.0)
Lymphs Abs: 1.7 10*3/uL (ref 0.7–4.0)
MCHC: 32 g/dL (ref 30.0–36.0)
MCV: 78.2 fl (ref 78.0–100.0)
MONO ABS: 0.5 10*3/uL (ref 0.1–1.0)
Monocytes Relative: 8.7 % (ref 3.0–12.0)
NEUTROS ABS: 3.1 10*3/uL (ref 1.4–7.7)
Neutrophils Relative %: 57.2 % (ref 43.0–77.0)
PLATELETS: 284 10*3/uL (ref 150.0–400.0)
RBC: 4.52 Mil/uL (ref 3.87–5.11)
RDW: 15.7 % — AB (ref 11.5–15.5)
WBC: 5.5 10*3/uL (ref 4.0–10.5)

## 2015-05-20 LAB — COMPREHENSIVE METABOLIC PANEL
ALT: 16 U/L (ref 0–35)
AST: 22 U/L (ref 0–37)
Albumin: 4.1 g/dL (ref 3.5–5.2)
Alkaline Phosphatase: 73 U/L (ref 39–117)
BUN: 13 mg/dL (ref 6–23)
CHLORIDE: 107 meq/L (ref 96–112)
CO2: 25 meq/L (ref 19–32)
Calcium: 9.5 mg/dL (ref 8.4–10.5)
Creatinine, Ser: 0.83 mg/dL (ref 0.40–1.20)
GFR: 80.07 mL/min (ref >60.00)
GLUCOSE: 94 mg/dL (ref 70–99)
Potassium: 4.2 mEq/L (ref 3.5–5.1)
SODIUM: 139 meq/L (ref 135–145)
Total Bilirubin: 0.8 mg/dL (ref 0.2–1.2)
Total Protein: 7 g/dL (ref 6.0–8.3)

## 2015-05-20 LAB — LIPID PANEL
CHOL/HDL RATIO: 4
Cholesterol: 141 mg/dL (ref 0–200)
HDL: 35.7 mg/dL — ABNORMAL LOW (ref >39.00)
LDL CALC: 95 mg/dL (ref 0–99)
NonHDL: 104.98
TRIGLYCERIDES: 52 mg/dL (ref 0.0–149.0)
VLDL: 10.4 mg/dL (ref 0.0–40.0)

## 2015-05-20 LAB — TSH: TSH: 1.31 u[IU]/mL (ref 0.35–4.50)

## 2015-05-27 ENCOUNTER — Other Ambulatory Visit: Payer: Self-pay | Admitting: Family Medicine

## 2015-05-27 ENCOUNTER — Ambulatory Visit (INDEPENDENT_AMBULATORY_CARE_PROVIDER_SITE_OTHER): Payer: BLUE CROSS/BLUE SHIELD | Admitting: Family Medicine

## 2015-05-27 ENCOUNTER — Encounter: Payer: Self-pay | Admitting: Family Medicine

## 2015-05-27 VITALS — BP 124/82 | HR 54 | Temp 98.5°F | Ht 64.25 in | Wt 147.8 lb

## 2015-05-27 DIAGNOSIS — Z0000 Encounter for general adult medical examination without abnormal findings: Secondary | ICD-10-CM | POA: Diagnosis not present

## 2015-05-27 DIAGNOSIS — D649 Anemia, unspecified: Secondary | ICD-10-CM | POA: Diagnosis not present

## 2015-05-27 DIAGNOSIS — N926 Irregular menstruation, unspecified: Secondary | ICD-10-CM

## 2015-05-27 DIAGNOSIS — B850 Pediculosis due to Pediculus humanus capitis: Secondary | ICD-10-CM | POA: Insufficient documentation

## 2015-05-27 DIAGNOSIS — Z1231 Encounter for screening mammogram for malignant neoplasm of breast: Secondary | ICD-10-CM

## 2015-05-27 MED ORDER — DROSPIRENONE-ETHINYL ESTRADIOL 3-0.03 MG PO TABS: 1.0000 | ORAL_TABLET | Freq: Every day | ORAL | Status: DC

## 2015-05-27 MED ORDER — IVERMECTIN 0.5 % EX LOTN: TOPICAL_LOTION | CUTANEOUS | Status: DC

## 2015-05-27 NOTE — Assessment & Plan Note (Signed)
Reviewed health habits including diet and exercise and skin cancer prevention Reviewed appropriate screening tests for age  Also reviewed health mt list, fam hx and immunization status , as well as social and family history   See HPI Labs reviewed Again enc to start mvi with iron for mild anemia Don't forget to schedule your mammogram  Try to get some exercise on a regular schedule  Omega 3 supplement (like fish oil)-that will also help the HDL cholesterol Take care of yourself

## 2015-05-27 NOTE — Progress Notes (Signed)
Subjective:    Patient ID: Courtney Pineda, female    DOB: 07-Jul-1970, 45 y.o.   MRN: [id number]  HPI Here for health maintenance exam and to review chronic medical problems    Recently had head lice  Her pharmacy could not get ovide  She has tried permetherin - otc max strength - helped but not totally gone ? (still some itching) Kids had it as well  Can no longer see nits in her hair   Wt is down 1 lb with bmi of 25 Wants to loose a little weight  Gained wt at her work physical  Thinks she is out of shape and needs more exercise  Likes to ride bikes with her kids  Has a stepper -needs to get it out and use it  Schedule is crazy -needs to work it in  A lot of  overtime at work   Eats fair - sometime sporatic   HIV screen-declines -not high risk   Mammogram nl 1/16= has to schedule at [facility] Due for it now Self exam-no lumps   Flu shot 10/16  Pap 2/16 neg (3 pap in a row nl with neg hpv) No new sexual partners  She has still c/o irregular menses -some are very heavy (they vary) husb had vasectomy  She has not been on an OC -but she is open to it  ? If early menopause in family  Started menses early on friday   Td 12/14  Results for orders placed or performed in visit on 05/20/15  CBC with Differential/Platelet  Result Value Ref Range   WBC 5.5 4.0 - 10.5 K/uL   RBC 4.52 3.87 - 5.11 Mil/uL   Hemoglobin 11.3 (L) 12.0 - 15.0 g/dL   HCT 35.3 (L) 36.0 - 46.0 %   MCV 78.2 78.0 - 100.0 fl   MCHC 32.0 30.0 - 36.0 g/dL   RDW 15.7 (H) 11.5 - 15.5 %   Platelets 284.0 150.0 - 400.0 K/uL   Neutrophils Relative % 57.2 43.0 - 77.0 %   Lymphocytes Relative 31.5 12.0 - 46.0 %   Monocytes Relative 8.7 3.0 - 12.0 %   Eosinophils Relative 1.9 0.0 - 5.0 %   Basophils Relative 0.7 0.0 - 3.0 %   Neutro Abs 3.1 1.4 - 7.7 K/uL   Lymphs Abs 1.7 0.7 - 4.0 K/uL   Monocytes Absolute 0.5 0.1 - 1.0 K/uL   Eosinophils Absolute 0.1 0.0 - 0.7 K/uL   Basophils Absolute 0.0 0.0 -  0.1 K/uL  Comprehensive metabolic panel  Result Value Ref Range   Sodium 139 135 - 145 mEq/L   Potassium 4.2 3.5 - 5.1 mEq/L   Chloride 107 96 - 112 mEq/L   CO2 25 19 - 32 mEq/L   Glucose, Bld 94 70 - 99 mg/dL   BUN 13 6 - 23 mg/dL   Creatinine, Ser 0.83 0.40 - 1.20 mg/dL   Total Bilirubin 0.8 0.2 - 1.2 mg/dL   Alkaline Phosphatase 73 39 - 117 U/L   AST 22 0 - 37 U/L   ALT 16 0 - 35 U/L   Total Protein 7.0 6.0 - 8.3 g/dL   Albumin 4.1 3.5 - 5.2 g/dL   Calcium 9.5 8.4 - 10.5 mg/dL   GFR 80.07 >60.00 mL/min  Lipid panel  Result Value Ref Range   Cholesterol 141 0 - 200 mg/dL   Triglycerides 52.0 0.0 - 149.0 mg/dL   HDL 35.70 (L) >39.00 mg/dL   VLDL 10.4  0.0 - 40.0 mg/dL   LDL Cholesterol 95 0 - 99 mg/dL   Total CHOL/HDL Ratio 4    NonHDL 104.98   TSH  Result Value Ref Range   TSH 1.31 0.35 - 4.50 uIU/mL     HDL is low  Not exercising  She does not eat a lot of fish- likes tuna    Patient Active Problem List   Diagnosis Date Noted  . Head lice 123456  . Irregular menses 03/01/2015  . Encounter for routine gynecological examination 05/23/2014  . Dyspareunia 05/23/2014  . Burning sensation of mouth 09/12/2013  . Left knee pain 02/19/2011  . Anemia, mild 01/13/2011  . Nonspecific elevation of levels of transaminase or lactic acid dehydrogenase (LDH) 01/13/2011  . Routine general medical examination at a health care facility 01/05/2011  . KIDNEY STONE 03/27/2010  . MAMMOGRAM, ABNORMAL, LEFT 01/14/2009  . Nonspecific abnormal electrocardiogram (ECG) (EKG) 05/31/2008  . STRESS REACTION, ACUTE, WITH EMOTIONAL DISTURBANCE 09/13/2007  . HEADACHE 09/13/2007  . NEVUS, ATYPICAL 08/20/2006   Past Medical History  Diagnosis Date  . Kidney stones   . Atypical moles     abnormal moles removed from neck.  . Hyperemesis gravidarum 1999   Past Surgical History  Procedure Laterality Date  . Tonsillectomy    . Cholecystectomy  06/1994  . Tonsilectomy, adenoidectomy,  bilateral myringotomy and tubes      myringotomy tubes  . Lithotripsy  03/2006   Social History  Substance Use Topics  . Smoking status: Never Smoker   . Smokeless tobacco: None  . Alcohol Use: No   Family History  Problem Relation Age of Onset  . Migraines Mother   . Cancer Paternal Aunt     breast cancer  . Cancer Maternal Grandmother     brain tumor/breast cancer  . Heart disease Maternal Grandfather     heart problems   Allergies  Allergen Reactions  . Sulfonamide Derivatives     REACTION: Lungs close up  . Amoxicillin-Pot Clavulanate     REACTION: rash   No current outpatient prescriptions on file prior to visit.   No current facility-administered medications on file prior to visit.     Review of Systems    Review of Systems  Constitutional: Negative for fever, appetite change, fatigue and unexpected weight change.  Eyes: Negative for pain and visual disturbance.  Respiratory: Negative for cough and shortness of breath.   Cardiovascular: Negative for cp or palpitations    Gastrointestinal: Negative for nausea, diarrhea and constipation.  Genitourinary: Negative for urgency and frequency. pos for irregular and heavy menses  Skin: Negative for pallor or rash  pos for intermittently itchy scalp Neurological: Negative for weakness, light-headedness, numbness and headaches.  Hematological: Negative for adenopathy. Does not bruise/bleed easily.  Psychiatric/Behavioral: Negative for dysphoric mood. The patient is not nervous/anxious.  pos for stressors bringing up teens/pre teens    Objective:   Physical Exam  Constitutional: She appears well-developed and well-nourished. No distress.  Well appearing   HENT:  Head: Normocephalic and atraumatic.  Right Ear: External ear normal.  Left Ear: External ear normal.  Mouth/Throat: Oropharynx is clear and moist.  Eyes: Conjunctivae and EOM are normal. Pupils are equal, round, and reactive to light. No scleral icterus.    Neck: Normal range of motion. Neck supple. No JVD present. Carotid bruit is not present. No thyromegaly present.  Cardiovascular: Normal rate, regular rhythm, normal heart sounds and intact distal pulses.  Exam reveals no gallop.  Pulmonary/Chest: Effort normal and breath sounds normal. No respiratory distress. She has no wheezes. She exhibits no tenderness.  Abdominal: Soft. Bowel sounds are normal. She exhibits no distension, no abdominal bruit and no mass. There is no tenderness.  Genitourinary: No breast swelling, tenderness, discharge or bleeding.  Breast exam: No mass, nodules, thickening, tenderness, bulging, retraction, inflamation, nipple discharge or skin changes noted.  No axillary or clavicular LA.      Musculoskeletal: Normal range of motion. She exhibits no edema or tenderness.  Lymphadenopathy:    She has no cervical adenopathy.  Neurological: She is alert. She has normal reflexes. No cranial nerve deficit. She exhibits normal muscle tone. Coordination normal.  Skin: Skin is warm and dry. No rash noted. No erythema. No pallor.  Mild acne  Nits present in hair No active lice seen  Psychiatric: She has a normal mood and affect.          Assessment & Plan:   Problem List Items Addressed This Visit      Musculoskeletal and Integument   Head lice    Ongoing -pharmacy could not get ovide Will try SKLICE for resistant lice  emph imp of using nit comb as well Update if not starting to improve in a week or if worsening          Other   Anemia, mild    From heavy menses (perimenopausal) Recommend mvi with iron daily  Continue to follow       Irregular menses    Sometimes heavy and painful Mild anemia Suspect due to perimenopausal change Will try OC - yasmin generic -disc expectations Long discussion re: way to take OC properly and avoidance of smoking  Risks of blood clots outlined as well as possible side eff Pt aware that this does not prevent stds and  condoms should still be used inst that it may take up to 3 months for menses to fall into rhythm or side eff to stop  Adv to call if problems or questions        Routine general medical examination at a health care facility - Primary    Reviewed health habits including diet and exercise and skin cancer prevention Reviewed appropriate screening tests for age  Also reviewed health mt list, fam hx and immunization status , as well as social and family history   See HPI Labs reviewed Again enc to start mvi with iron for mild anemia Don't forget to schedule your mammogram  Try to get some exercise on a regular schedule  Omega 3 supplement (like fish oil)-that will also help the HDL cholesterol Take care of yourself

## 2015-05-27 NOTE — Assessment & Plan Note (Signed)
Ongoing -pharmacy could not get ovide Will try SKLICE for resistant lice  emph imp of using nit comb as well Update if not starting to improve in a week or if worsening

## 2015-05-27 NOTE — Progress Notes (Signed)
Pre visit review using our clinic review tool, if applicable. No additional management support is needed unless otherwise documented below in the visit note. 

## 2015-05-27 NOTE — Assessment & Plan Note (Signed)
From heavy menses (perimenopausal) Recommend mvi with iron daily  Continue to follow

## 2015-05-27 NOTE — Patient Instructions (Signed)
I think you are going through some perimenopausal period changes  Try the generic yasmin oral contraceptive (either start it now or on Sunday)-read package insert and do not smoke on the pill  Try the [facility] for lice treatment- update me if not effective -continue to use the nit comb Don't forget to schedule your mammogram  Try to get some exercise on a regular schedule  Omega 3 supplement (like fish oil)-that will also help the HDL cholesterol Take care of yourself

## 2015-05-27 NOTE — Assessment & Plan Note (Signed)
Sometimes heavy and painful Mild anemia Suspect due to perimenopausal change Will try OC - yasmin generic -disc expectations Long discussion re: way to take OC properly and avoidance of smoking  Risks of blood clots outlined as well as possible side eff Pt aware that this does not prevent stds and condoms should still be used inst that it may take up to 3 months for menses to fall into rhythm or side eff to stop  Adv to call if problems or questions

## 2015-06-03 ENCOUNTER — Ambulatory Visit
Admission: RE | Admit: 2015-06-03 | Discharge: 2015-06-03 | Disposition: A | Discharge status: Home / Self Care | Payer: BLUE CROSS/BLUE SHIELD | Source: Ambulatory Visit | Attending: Family Medicine | Admitting: Family Medicine

## 2015-06-03 DIAGNOSIS — Z1231 Encounter for screening mammogram for malignant neoplasm of breast: Secondary | ICD-10-CM | POA: Diagnosis present

## 2015-06-03 LAB — HM MAMMOGRAPHY: HM Mammogram: NORMAL

## 2015-06-07 ENCOUNTER — Encounter: Payer: Self-pay | Admitting: *Deleted

## 2015-09-16 DIAGNOSIS — L820 Inflamed seborrheic keratosis: Secondary | ICD-10-CM | POA: Diagnosis not present

## 2015-09-16 DIAGNOSIS — L821 Other seborrheic keratosis: Secondary | ICD-10-CM | POA: Diagnosis not present

## 2015-09-16 DIAGNOSIS — D239 Other benign neoplasm of skin, unspecified: Secondary | ICD-10-CM

## 2015-09-16 DIAGNOSIS — D225 Melanocytic nevi of trunk: Secondary | ICD-10-CM | POA: Diagnosis not present

## 2015-09-16 DIAGNOSIS — L578 Other skin changes due to chronic exposure to nonionizing radiation: Secondary | ICD-10-CM | POA: Diagnosis not present

## 2015-09-16 DIAGNOSIS — D485 Neoplasm of uncertain behavior of skin: Secondary | ICD-10-CM | POA: Diagnosis not present

## 2015-09-16 DIAGNOSIS — D229 Melanocytic nevi, unspecified: Secondary | ICD-10-CM | POA: Diagnosis not present

## 2015-09-16 DIAGNOSIS — Z1283 Encounter for screening for malignant neoplasm of skin: Secondary | ICD-10-CM | POA: Diagnosis not present

## 2015-09-16 HISTORY — DX: Other benign neoplasm of skin, unspecified: D23.9

## 2015-12-11 ENCOUNTER — Encounter: Payer: Self-pay | Admitting: Family Medicine

## 2015-12-11 ENCOUNTER — Ambulatory Visit (INDEPENDENT_AMBULATORY_CARE_PROVIDER_SITE_OTHER): Payer: BLUE CROSS/BLUE SHIELD | Admitting: Family Medicine

## 2015-12-11 VITALS — BP 112/60 | HR 60 | Wt 155.8 lb

## 2015-12-11 DIAGNOSIS — N946 Dysmenorrhea, unspecified: Secondary | ICD-10-CM

## 2015-12-11 DIAGNOSIS — M25531 Pain in right wrist: Secondary | ICD-10-CM | POA: Diagnosis not present

## 2015-12-11 DIAGNOSIS — S161XXA Strain of muscle, fascia and tendon at neck level, initial encounter: Secondary | ICD-10-CM | POA: Diagnosis not present

## 2015-12-11 MED ORDER — CYCLOBENZAPRINE HCL 10 MG PO TABS: 10.0000 mg | ORAL_TABLET | Freq: Every evening | ORAL | 0 refills | Status: DC | PRN

## 2015-12-11 MED ORDER — MELOXICAM 15 MG PO TABS: 15.0000 mg | ORAL_TABLET | Freq: Every day | ORAL | 0 refills | Status: DC

## 2015-12-11 NOTE — Patient Instructions (Signed)
Wrist Pain There are many things that can cause wrist pain. Some common causes include:  An injury to the wrist area, such as a sprain, strain, or fracture.  Overuse of the joint.  A condition that causes increased pressure on a nerve in the wrist (carpal tunnel syndrome).  Wear and tear of the joints that occurs with aging (osteoarthritis).  A variety of other types of arthritis. Sometimes, the cause of wrist pain is not known. The pain often goes away when you follow your health care provider's instructions for relieving pain at home. If your wrist pain continues, tests may need to be done to diagnose your condition. HOME CARE INSTRUCTIONS Pay attention to any changes in your symptoms. Take these actions to help with your pain:  Rest the wrist area for at least 48 hours or as told by your health care provider.  If directed, apply ice to the injured area:  Put ice in a plastic bag.  Place a towel between your skin and the bag.  Leave the ice on for 20 minutes, 2-3 times per day.  Keep your arm raised (elevated) above the level of your heart while you are sitting or lying down.  If a splint or elastic bandage has been applied, use it as told by your health care provider.  Remove the splint or bandage only as told by your health care provider.  Loosen the splint or bandage if your fingers become numb or have a tingling feeling, or if they turn cold or blue.  Take over-the-counter and prescription medicines only as told by your health care provider.  Keep all follow-up visits as told by your health care provider. This is important. SEEK MEDICAL CARE IF:  Your pain is not helped by treatment.  Your pain gets worse. SEEK IMMEDIATE MEDICAL CARE IF:  Your fingers become swollen.  Your fingers turn white, very red, or cold and blue.  Your fingers are numb or have a tingling feeling.  You have difficulty moving your fingers.   This information is not intended to replace  advice given to you by your health care provider. Make sure you discuss any questions you have with your health care provider.   Document Released: 01/07/2005 Document Revised: 12/19/2014 Document Reviewed: 08/15/2014 Elsevier Interactive Patient Education 2016 Elsevier Inc. Cervical Sprain A cervical sprain is an injury in the neck in which the strong, fibrous tissues (ligaments) that connect your neck bones stretch or tear. Cervical sprains can range from mild to severe. Severe cervical sprains can cause the neck vertebrae to be unstable. This can lead to damage of the spinal cord and can result in serious nervous system problems. The amount of time it takes for a cervical sprain to get better depends on the cause and extent of the injury. Most cervical sprains heal in 1 to 3 weeks. CAUSES  Severe cervical sprains may be caused by:   Contact sport injuries (such as from football, rugby, wrestling, hockey, auto racing, gymnastics, diving, martial arts, or boxing).   Motor vehicle collisions.   Whiplash injuries. This is an injury from a sudden forward and backward whipping movement of the head and neck.  Falls.  Mild cervical sprains may be caused by:   Being in an awkward position, such as while cradling a telephone between your ear and shoulder.   Sitting in a chair that does not offer proper support.   Working at a poorly Landscape [employer] station.   Looking up or down for  long periods of time.  SYMPTOMS   Pain, soreness, stiffness, or a burning sensation in the front, back, or sides of the neck. This discomfort may develop immediately after the injury or slowly, 24 hours or more after the injury.   Pain or tenderness directly in the middle of the back of the neck.   Shoulder or upper back pain.   Limited ability to move the neck.   Headache.   Dizziness.   Weakness, numbness, or tingling in the hands or arms.   Muscle spasms.   Difficulty swallowing or  chewing.   Tenderness and swelling of the neck.  DIAGNOSIS  Most of the time your health care provider can diagnose a cervical sprain by taking your history and doing a physical exam. Your health care provider will ask about previous neck injuries and any known neck problems, such as arthritis in the neck. X-rays may be taken to find out if there are any other problems, such as with the bones of the neck. Other tests, such as a CT scan or MRI, may also be needed.  TREATMENT  Treatment depends on the severity of the cervical sprain. Mild sprains can be treated with rest, keeping the neck in place (immobilization), and pain medicines. Severe cervical sprains are immediately immobilized. Further treatment is done to help with pain, muscle spasms, and other symptoms and may include:  Medicines, such as pain relievers, numbing medicines, or muscle relaxants.   Physical therapy. This may involve stretching exercises, strengthening exercises, and posture training. Exercises and improved posture can help stabilize the neck, strengthen muscles, and help stop symptoms from returning.  HOME CARE INSTRUCTIONS   Put ice on the injured area.   Put ice in a plastic bag.   Place a towel between your skin and the bag.   Leave the ice on for 15-20 minutes, 3-4 times a day.   If your injury was severe, you may have been given a cervical collar to wear. A cervical collar is a two-piece collar designed to keep your neck from moving while it heals.  Do not remove the collar unless instructed by your health care provider.  If you have long hair, keep it outside of the collar.  Ask your health care provider before making any adjustments to your collar. Minor adjustments may be required over time to improve comfort and reduce pressure on your chin or on the back of your head.  Ifyou are allowed to remove the collar for cleaning or bathing, follow your health care provider's instructions on how to do so  safely.  Keep your collar clean by wiping it with mild soap and water and drying it completely. If the collar you have been given includes removable pads, remove them every 1-2 days and hand wash them with soap and water. Allow them to air dry. They should be completely dry before you wear them in the collar.  If you are allowed to remove the collar for cleaning and bathing, wash and dry the skin of your neck. Check your skin for irritation or sores. If you see any, tell your health care provider.  Do not drive while wearing the collar.   Only take over-the-counter or prescription medicines for pain, discomfort, or fever as directed by your health care provider.   Keep all follow-up appointments as directed by your health care provider.   Keep all physical therapy appointments as directed by your health care provider.   Make any needed adjustments to your  workstation to promote good posture.   Avoid positions and activities that make your symptoms worse.   Warm up and stretch before being active to help prevent problems.  SEEK MEDICAL CARE IF:   Your pain is not controlled with medicine.   You are unable to decrease your pain medicine over time as planned.   Your activity level is not improving as expected.  SEEK IMMEDIATE MEDICAL CARE IF:   You develop any bleeding.  You develop stomach upset.  You have signs of an allergic reaction to your medicine.   Your symptoms get worse.   You develop new, unexplained symptoms.   You have numbness, tingling, weakness, or paralysis in any part of your body.  MAKE SURE YOU:   Understand these instructions.  Will watch your condition.  Will get help right away if you are not doing well or get worse.   This information is not intended to replace advice given to you by your health care provider. Make sure you discuss any questions you have with your health care provider.   Document Released: 01/25/2007 Document  Revised: 04/04/2013 Document Reviewed: 10/05/2012 Elsevier Interactive Patient Education [employer].

## 2015-12-11 NOTE — Progress Notes (Signed)
Subjective:    Patient ID: Courtney Pineda, female    DOB: [birth year], 45 y.o.   MRN: [id number]  HPI This is a 45 yo female who presents with left sided neck and shoulder pain that started last night. Feels like she has a "knot," no improvement with acetaminophen. Does not recall any trauma or injury. No headache, no visual changes, no bowel or bladder changes. No previous back or neck pain. Some improvement with hot shower. Felt stiff this morning. She was sitting at work and felt dizzy before lunch. Ate lunch and felt ok.   She has had right wrist pain for 1.5 weeks, pain with lifting/carrying. Has had some sharp pains which bother her at night. No numbness or tingling. She is left handed but uses right hand for mouse at work. Mouse pad elevated and in front of her. Works at [employer] all day.   Continues to have heavy periods. Was prescribed OCPs by Dr. Glori Bickers. Does not like to take medicine and had prescription filled but didn't take. Feels fatigued during menses.   Past Medical History:  Diagnosis Date  . Atypical moles    abnormal moles removed from neck.  . Hyperemesis gravidarum 1999  . Kidney stones    Past Surgical History:  Procedure Laterality Date  . CHOLECYSTECTOMY  06/1994  . LITHOTRIPSY  03/2006  . TONSILECTOMY, ADENOIDECTOMY, BILATERAL MYRINGOTOMY AND TUBES     myringotomy tubes  . TONSILLECTOMY     Family History  Problem Relation Age of Onset  . Migraines Mother   . Cancer Paternal Aunt     breast cancer  . Breast cancer Paternal Aunt   . Cancer Maternal Grandmother     brain tumor/breast cancer  . Breast cancer Maternal Grandmother 70  . Heart disease Maternal Grandfather     heart problems   Social History  Substance Use Topics  . Smoking status: Never Smoker  . Smokeless tobacco: Not on file  . Alcohol use No      Review of Systems Per HPI    Objective:   Physical Exam  Constitutional: She is oriented to person, place, and time. She  appears well-developed and well-nourished. No distress.  HENT:  Head: Normocephalic and atraumatic.  Eyes: Conjunctivae and EOM are normal. Pupils are equal, round, and reactive to light. Right eye exhibits no discharge. Left eye exhibits no discharge.  Neck: Normal range of motion. Muscular tenderness (left trapezoid) present. No spinous process tenderness present. No neck rigidity. No edema, no erythema and normal range of motion present.  Good rom, pain with lateral rotation.   Cardiovascular: Normal rate, regular rhythm and normal heart sounds.   Pulmonary/Chest: Effort normal and breath sounds normal.  Musculoskeletal:       Right wrist: She exhibits normal range of motion, no tenderness, no bony tenderness, no swelling, no crepitus and no deformity.  Neurological: She is alert and oriented to person, place, and time. She has normal reflexes. No cranial nerve deficit.  Skin: Skin is warm and dry. She is not diaphoretic.  Psychiatric: She has a normal mood and affect. Her behavior is normal. Judgment and thought content normal.      BP 112/60   Pulse 60   Wt 155 lb 12.8 oz (70.7 kg)   SpO2 99%   BMI 26.54 kg/m  Wt Readings from Last 3 Encounters:  12/11/15 155 lb 12.8 oz (70.7 kg)  05/27/15 147 lb 12 oz (67 kg)  03/01/15 148 lb 4 oz (  67.2 kg)       Assessment & Plan:  1. Cervical strain, initial encounter - Provided written and verbal information regarding diagnosis and treatment. - RTC precautions reviewed - meloxicam (MOBIC) 15 MG tablet; Take 1 tablet (15 mg total) by mouth daily.  Dispense: 20 tablet; Refill: 0 - cyclobenzaprine (FLEXERIL) 10 MG tablet; Take 1 tablet (10 mg total) by mouth at bedtime as needed for muscle spasms.  Dispense: 30 tablet; Refill: 0 - heat prn -gentle ROM tid  2. Wrist pain, acute, right - fitted with thumb spica for comfort - meloxicam (MOBIC) 15 MG tablet; Take 1 tablet (15 mg total) by mouth daily.  Dispense: 20 tablet; Refill: 0  3.  Dysmenorrhea - reviewed labs from 2/17 and discussed symptoms and pros/cons of OCPS   Clarene Reamer, FNP-BC  [facility], [place]  12/11/2015 9:02 PM

## 2016-01-15 ENCOUNTER — Encounter: Payer: Self-pay | Admitting: Family Medicine

## 2016-01-15 ENCOUNTER — Ambulatory Visit (INDEPENDENT_AMBULATORY_CARE_PROVIDER_SITE_OTHER): Payer: BLUE CROSS/BLUE SHIELD | Admitting: Family Medicine

## 2016-01-15 VITALS — BP 112/70 | HR 78 | Temp 98.2°F | Ht 64.25 in | Wt 157.5 lb

## 2016-01-15 DIAGNOSIS — M545 Low back pain, unspecified: Secondary | ICD-10-CM

## 2016-01-15 DIAGNOSIS — R102 Pelvic and perineal pain: Secondary | ICD-10-CM

## 2016-01-15 DIAGNOSIS — N926 Irregular menstruation, unspecified: Secondary | ICD-10-CM

## 2016-01-15 DIAGNOSIS — N898 Other specified noninflammatory disorders of vagina: Secondary | ICD-10-CM | POA: Diagnosis not present

## 2016-01-15 LAB — POC URINALSYSI DIPSTICK (AUTOMATED)
Bilirubin, UA: NEGATIVE
Blood, UA: 10
Glucose, UA: NEGATIVE
KETONES UA: NEGATIVE
LEUKOCYTES UA: NEGATIVE
Nitrite, UA: NEGATIVE
PH UA: 6.5
PROTEIN UA: NEGATIVE
UROBILINOGEN UA: 0.2

## 2016-01-15 LAB — POCT URINE PREGNANCY: Preg Test, Ur: NEGATIVE

## 2016-01-15 MED ORDER — METRONIDAZOLE 500 MG PO TABS: 500.0000 mg | ORAL_TABLET | Freq: Two times a day (BID) | ORAL | 0 refills | Status: DC

## 2016-01-15 NOTE — Progress Notes (Signed)
Subjective:    Patient ID: Courtney Pineda, female    DOB: 1971/03/16, 45 y.o.   MRN: [id number]  HPI  Here for left lower back pain that radiates to her L pelvic area  She feels bloated and tight in her abdomen  Does not feel like this is a kidney stone   Just does not feel right  Is gaining weight- and this upsets her because she is trying to loose  Exercising - bike riding and walking at least a mile  Trying to eat a healthy diet - avoiding junk and a lot of carbs   Nl pap 2/16 Some discharge with odor  Uncomfortable intercourse  Menses is irregular  She never started the OC  No menses in 2 mo - no hot flashes or night sweats   Results for orders placed or performed in visit on 01/15/16  POCT Urinalysis Dipstick (Automated)  Result Value Ref Range   Color, UA yellow    Clarity, UA clear    Glucose, UA neg    Bilirubin, UA neg    Ketones, UA neg    Spec Grav, UA >=1.030    Blood, UA 10 ery/ul    pH, UA 6.5    Protein, UA neg    Urobilinogen, UA 0.2    Nitrite, UA neg    Leukocytes, UA Negative Negative  POCT urine pregnancy  Result Value Ref Range   Preg Test, Ur Negative Negative       Wt Readings from Last 3 Encounters:  01/15/16 157 lb 8 oz (71.4 kg)  12/11/15 155 lb 12.8 oz (70.7 kg)  05/27/15 147 lb 12 oz (67 kg)   BP Readings from Last 3 Encounters:  01/15/16 (!) 158/78  12/11/15 112/60  05/27/15 124/82   bp is high today Better on 2nd check BP: 112/70    Urine preg test is neg   Results for orders placed or performed in visit on 01/15/16  POCT Urinalysis Dipstick (Automated)  Result Value Ref Range   Color, UA yellow    Clarity, UA clear    Glucose, UA neg    Bilirubin, UA neg    Ketones, UA neg    Spec Grav, UA >=1.030    Blood, UA 10 ery/ul    pH, UA 6.5    Protein, UA neg    Urobilinogen, UA 0.2    Nitrite, UA neg    Leukocytes, UA Negative Negative  POCT urine pregnancy  Result Value Ref Range   Preg Test, Ur Negative  Negative    Patient Active Problem List   Diagnosis Date Noted  . Vaginal discharge 01/15/2016  . Pelvic pain 01/15/2016  . Low back pain 01/15/2016  . Head lice [id number]  . Irregular menses 03/01/2015  . Encounter for routine gynecological examination 05/23/2014  . Dyspareunia 05/23/2014  . Burning sensation of mouth 09/12/2013  . Left knee pain 02/19/2011  . Anemia, mild 01/13/2011  . Nonspecific elevation of levels of transaminase or lactic acid dehydrogenase (LDH) 01/13/2011  . Routine general medical examination at a health care facility 01/05/2011  . KIDNEY STONE 03/27/2010  . MAMMOGRAM, ABNORMAL, LEFT 01/14/2009  . Nonspecific abnormal electrocardiogram (ECG) (EKG) 05/31/2008  . STRESS REACTION, ACUTE, WITH EMOTIONAL DISTURBANCE 09/13/2007  . HEADACHE 09/13/2007  . NEVUS, ATYPICAL 08/20/2006   Past Medical History:  Diagnosis Date  . Atypical moles    abnormal moles removed from neck.  . Hyperemesis gravidarum 1999  . Kidney stones  Past Surgical History:  Procedure Laterality Date  . CHOLECYSTECTOMY  06/1994  . LITHOTRIPSY  03/2006  . TONSILECTOMY, ADENOIDECTOMY, BILATERAL MYRINGOTOMY AND TUBES     myringotomy tubes  . TONSILLECTOMY     Social History  Substance Use Topics  . Smoking status: Never Smoker  . Smokeless tobacco: Never Used  . Alcohol use No   Family History  Problem Relation Age of Onset  . Migraines Mother   . Cancer Paternal Aunt     breast cancer  . Breast cancer Paternal Aunt   . Cancer Maternal Grandmother     brain tumor/breast cancer  . Breast cancer Maternal Grandmother 73  . Heart disease Maternal Grandfather     heart problems   Allergies  Allergen Reactions  . Sulfonamide Derivatives     REACTION: Lungs close up  . Amoxicillin-Pot Clavulanate     REACTION: rash   No current outpatient prescriptions on file prior to visit.   No current facility-administered medications on file prior to visit.     Review of  Systems    Review of Systems  Constitutional: Negative for fever, appetite change, fatigue and pos for unexpected weight change. (gain) Eyes: Negative for pain and visual disturbance.  Respiratory: Negative for cough and shortness of breath.   Cardiovascular: Negative for cp or palpitations    Gastrointestinal: Negative for nausea, diarrhea and constipation.  Genitourinary: Negative for urgency and frequency. neg for dysuria or hematuria, pos for vaginal d/c and pelvic pain  MSK pos for L low back pain  Skin: Negative for pallor or rash   Neurological: Negative for weakness, light-headedness, numbness and headaches.  Hematological: Negative for adenopathy. Does not bruise/bleed easily.  Psychiatric/Behavioral: Negative for dysphoric mood. Pos for frustration and labile mood    Objective:   Physical Exam  Constitutional: She appears well-developed and well-nourished. No distress.  overwt and well app  HENT:  Head: Normocephalic and atraumatic.  Eyes: Conjunctivae and EOM are normal. Pupils are equal, round, and reactive to light. No scleral icterus.  Neck: Normal range of motion. Neck supple.  Cardiovascular: Normal rate, regular rhythm and normal heart sounds.   Pulmonary/Chest: Effort normal and breath sounds normal. She has no wheezes. She has no rales.  Abdominal: Soft. Bowel sounds are normal. She exhibits no distension and no mass. There is tenderness. There is no rebound.  No cva tenderness  Mild suprapubic tenderness, more tender in L pelvic area (no rebound or guarding)  No rash or inguinal adenopathy  Genitourinary:  Genitourinary Comments: Mild tenderness with bimanual exam on L side  No CMT or M  Thin white to yellow dc with odor  No excoriations or lesions     Musculoskeletal: She exhibits tenderness. She exhibits no edema.       Right shoulder: She exhibits tenderness. She exhibits normal range of motion, no bony tenderness, no swelling, no effusion and no  crepitus.       Lumbar back: She exhibits decreased range of motion, tenderness and spasm. She exhibits no bony tenderness and no edema.  Tender in L lumbar musculature and in SI area Neg slr Nl rom of hip  Lymphadenopathy:    She has no cervical adenopathy.  Neurological: She is alert. She has normal strength and normal reflexes. She displays no atrophy. No cranial nerve deficit or sensory deficit. She exhibits normal muscle tone. Coordination normal.  Negative SLR  Skin: Skin is warm and dry. No rash noted. No erythema. No pallor.  Psychiatric:  Pt seems frustrated and is tearful at times Pleasant and attentive           Assessment & Plan:   Problem List Items Addressed This Visit      Other   Irregular menses    Ongoing  She has not started OC yet-enc her to begin that  Update if she does not start having regular menses        Low back pain    Unsure if related to her pelvic pain  Enc stretching and warm compresses  Pending [place] result  nsaid prn       Relevant Orders   POCT Urinalysis Dipstick (Automated) (Completed)   Pelvic pain - Primary    L sided  May be from or radiating to L low back Nl hip exam  Will order US pelvic to assess for evidence of a cyst or other problem  Plan upon result       Relevant Orders   US Pelvis Complete   US Transvaginal Non-OB   Vaginal discharge    With dyspareunia  Clue cells on wet prep  Will tx for BV with flagyl 500 mg bid  Update if no improvement       Relevant Orders   POCT Wet Prep [facility]) (Completed)    Other Visit Diagnoses    Missed period       Relevant Orders   POCT urine pregnancy (Completed)

## 2016-01-15 NOTE — Patient Instructions (Signed)
Start the flagyl 1 pill twice daily for a week for vaginal infection (bacterial vaginosis) - no sexual activity until you are done with this  Drink lots of fluids Go ahead and start the oral contraceptive  Stop at check out for referral for a pelvic ultrasound  Follow up with me in 3-4 weeks Continue healthy diet - try to avoid processed foods as much as you can

## 2016-01-15 NOTE — Progress Notes (Signed)
Pre visit review using our clinic review tool, if applicable. No additional management support is needed unless otherwise documented below in the visit note. 

## 2016-01-16 LAB — POCT WET PREP (WET MOUNT)
KOH WET PREP POC: NEGATIVE
TRICHOMONAS WET PREP HPF POC: ABSENT

## 2016-01-16 NOTE — Assessment & Plan Note (Signed)
Ongoing  She has not started OC yet-enc her to begin that  Update if she does not start having regular menses

## 2016-01-16 NOTE — Assessment & Plan Note (Signed)
With dyspareunia  Clue cells on wet prep  Will tx for BV with flagyl 500 mg bid  Update if no improvement

## 2016-01-16 NOTE — Assessment & Plan Note (Signed)
L sided  May be from or radiating to L low back Nl hip exam  Will order US pelvic to assess for evidence of a cyst or other problem  Plan upon result

## 2016-01-16 NOTE — Assessment & Plan Note (Signed)
Unsure if related to her pelvic pain  Enc stretching and warm compresses  Pending [place] result  nsaid prn

## 2016-01-17 ENCOUNTER — Telehealth: Payer: Self-pay

## 2016-01-17 MED ORDER — PROMETHAZINE HCL 12.5 MG PO TABS: 12.5000 mg | ORAL_TABLET | Freq: Three times a day (TID) | ORAL | 0 refills | Status: DC | PRN

## 2016-01-17 NOTE — Telephone Encounter (Signed)
(  per DPR) left voicemail letting pt know Rx sent and to take flagyl with food

## 2016-01-17 NOTE — Telephone Encounter (Signed)
Pt called back and informed of phenergan being called in. Also pt aware to take flagyl with food

## 2016-01-17 NOTE — Telephone Encounter (Signed)
Pt left v/m; pt was seen 01/15/16; pt been taking Flagyl which nauseates pt and pt request med to offset nausea feeling.[facility] [place].

## 2016-01-17 NOTE — Telephone Encounter (Signed)
I sent in some [place] it helps ! Make sure to take flagyl with food as well

## 2016-01-21 ENCOUNTER — Ambulatory Visit
Admission: RE | Admit: 2016-01-21 | Discharge: 2016-01-21 | Disposition: A | Discharge status: Home / Self Care | Payer: BLUE CROSS/BLUE SHIELD | Source: Ambulatory Visit | Attending: Family Medicine | Admitting: Family Medicine

## 2016-01-21 DIAGNOSIS — R102 Pelvic and perineal pain: Secondary | ICD-10-CM | POA: Diagnosis not present

## 2016-01-27 NOTE — Telephone Encounter (Signed)
Pt returned your call Best number [phone]

## 2016-01-28 ENCOUNTER — Telehealth: Payer: Self-pay | Admitting: Family Medicine

## 2016-01-28 NOTE — Telephone Encounter (Signed)
Aware-she will make her own appt

## 2016-01-28 NOTE — Telephone Encounter (Signed)
Pt called back. She is unsure about the burning because she has not had intercourse. She does still notice an odor. She will make appt with [facility].   Any other question, please call cell phone

## 2016-01-29 NOTE — Telephone Encounter (Signed)
Addressed through result notes (nothing to do with this phone note)

## 2016-02-06 ENCOUNTER — Telehealth: Payer: Self-pay | Admitting: Family Medicine

## 2016-02-06 NOTE — Telephone Encounter (Signed)
Patient returned call about her ultrasound results.  Patient said she'll call [facility] to schedule appointment.  Patient still has odor.  Patient wants to know if Dr.Tower wants her to keep appointment with Dr.Tower on 02/12/16.  Patient asked if Dr.Tower doesn't need to see her can she still come at that time for a flu shot, because she has that time off at work.

## 2016-02-06 NOTE — Telephone Encounter (Signed)
She can cancel appt with me -please make a nurse appt for a flu shot  See the gyn as planned

## 2016-02-06 NOTE — Telephone Encounter (Signed)
Pt notified of Dr. Marliss Coots comments and flu appt scheduled

## 2016-02-12 ENCOUNTER — Ambulatory Visit (INDEPENDENT_AMBULATORY_CARE_PROVIDER_SITE_OTHER): Payer: BLUE CROSS/BLUE SHIELD

## 2016-02-12 ENCOUNTER — Ambulatory Visit: Payer: BLUE CROSS/BLUE SHIELD | Admitting: Family Medicine

## 2016-02-12 DIAGNOSIS — Z23 Encounter for immunization: Secondary | ICD-10-CM

## 2016-02-14 ENCOUNTER — Ambulatory Visit: Payer: BLUE CROSS/BLUE SHIELD

## 2016-04-27 ENCOUNTER — Other Ambulatory Visit: Payer: Self-pay | Admitting: Family Medicine

## 2016-04-27 DIAGNOSIS — Z1231 Encounter for screening mammogram for malignant neoplasm of breast: Secondary | ICD-10-CM

## 2016-05-16 ENCOUNTER — Telehealth: Payer: Self-pay | Admitting: Family Medicine

## 2016-05-16 DIAGNOSIS — D649 Anemia, unspecified: Secondary | ICD-10-CM

## 2016-05-16 DIAGNOSIS — Z0000 Encounter for general adult medical examination without abnormal findings: Secondary | ICD-10-CM

## 2016-05-16 NOTE — Telephone Encounter (Signed)
-----   Message from Ellamae Sia sent at 05/12/2016  2:45 PM EST ----- Regarding: Lab orders for Monday, 2.12.18 Patient is scheduled for CPX labs, please order future labs, Thanks , Karna Christmas

## 2016-05-25 ENCOUNTER — Other Ambulatory Visit (INDEPENDENT_AMBULATORY_CARE_PROVIDER_SITE_OTHER): Payer: BLUE CROSS/BLUE SHIELD

## 2016-05-25 DIAGNOSIS — D649 Anemia, unspecified: Secondary | ICD-10-CM

## 2016-05-25 DIAGNOSIS — Z0000 Encounter for general adult medical examination without abnormal findings: Secondary | ICD-10-CM

## 2016-05-25 LAB — COMPREHENSIVE METABOLIC PANEL
ALBUMIN: 4.2 g/dL (ref 3.5–5.2)
ALK PHOS: 98 U/L (ref 39–117)
ALT: 47 U/L — ABNORMAL HIGH (ref 0–35)
AST: 46 U/L — ABNORMAL HIGH (ref 0–37)
BUN: 8 mg/dL (ref 6–23)
CO2: 28 mEq/L (ref 19–32)
Calcium: 9.2 mg/dL (ref 8.4–10.5)
Chloride: 107 mEq/L (ref 96–112)
Creatinine, Ser: 0.77 mg/dL (ref 0.40–1.20)
GFR: 86.89 mL/min (ref >60.00)
Glucose, Bld: 92 mg/dL (ref 70–99)
POTASSIUM: 4.2 meq/L (ref 3.5–5.1)
SODIUM: 139 meq/L (ref 135–145)
TOTAL PROTEIN: 7 g/dL (ref 6.0–8.3)
Total Bilirubin: 0.4 mg/dL (ref 0.2–1.2)

## 2016-05-25 LAB — CBC WITH DIFFERENTIAL/PLATELET
Basophils Absolute: 0 10*3/uL (ref 0.0–0.1)
Basophils Relative: 0.7 % (ref 0.0–3.0)
EOS PCT: 1.7 % (ref 0.0–5.0)
Eosinophils Absolute: 0.1 10*3/uL (ref 0.0–0.7)
HCT: 33.6 % — ABNORMAL LOW (ref 36.0–46.0)
HEMOGLOBIN: 10.8 g/dL — AB (ref 12.0–15.0)
LYMPHS PCT: 27.7 % (ref 12.0–46.0)
Lymphs Abs: 1.7 10*3/uL (ref 0.7–4.0)
MCHC: 32.1 g/dL (ref 30.0–36.0)
MCV: 77.2 fl — AB (ref 78.0–100.0)
MONO ABS: 0.4 10*3/uL (ref 0.1–1.0)
MONOS PCT: 7 % (ref 3.0–12.0)
Neutro Abs: 3.8 10*3/uL (ref 1.4–7.7)
Neutrophils Relative %: 62.9 % (ref 43.0–77.0)
Platelets: 370 10*3/uL (ref 150.0–400.0)
RBC: 4.35 Mil/uL (ref 3.87–5.11)
RDW: 15.7 % — ABNORMAL HIGH (ref 11.5–15.5)
WBC: 6 10*3/uL (ref 4.0–10.5)

## 2016-05-25 LAB — LIPID PANEL
Cholesterol: 144 mg/dL (ref 0–200)
HDL: 42 mg/dL (ref >39.00)
LDL Cholesterol: 89 mg/dL (ref 0–99)
NonHDL: 101.95
Total CHOL/HDL Ratio: 3
Triglycerides: 63 mg/dL (ref 0.0–149.0)
VLDL: 12.6 mg/dL (ref 0.0–40.0)

## 2016-05-25 LAB — TSH: TSH: 1.56 u[IU]/mL (ref 0.35–4.50)

## 2016-05-25 LAB — FERRITIN: FERRITIN: 5.5 ng/mL — AB (ref 10.0–291.0)

## 2016-06-01 ENCOUNTER — Ambulatory Visit (INDEPENDENT_AMBULATORY_CARE_PROVIDER_SITE_OTHER): Payer: BLUE CROSS/BLUE SHIELD | Admitting: Family Medicine

## 2016-06-01 ENCOUNTER — Encounter: Payer: Self-pay | Admitting: Family Medicine

## 2016-06-01 VITALS — BP 106/70 | HR 56 | Temp 98.4°F | Ht 64.25 in | Wt 157.8 lb

## 2016-06-01 DIAGNOSIS — M25531 Pain in right wrist: Secondary | ICD-10-CM | POA: Diagnosis not present

## 2016-06-01 DIAGNOSIS — N920 Excessive and frequent menstruation with regular cycle: Secondary | ICD-10-CM | POA: Diagnosis not present

## 2016-06-01 DIAGNOSIS — R748 Abnormal levels of other serum enzymes: Secondary | ICD-10-CM | POA: Diagnosis not present

## 2016-06-01 DIAGNOSIS — R05 Cough: Secondary | ICD-10-CM | POA: Diagnosis not present

## 2016-06-01 DIAGNOSIS — J45991 Cough variant asthma: Secondary | ICD-10-CM | POA: Insufficient documentation

## 2016-06-01 DIAGNOSIS — D649 Anemia, unspecified: Secondary | ICD-10-CM | POA: Diagnosis not present

## 2016-06-01 DIAGNOSIS — Z0000 Encounter for general adult medical examination without abnormal findings: Secondary | ICD-10-CM | POA: Diagnosis not present

## 2016-06-01 DIAGNOSIS — R51 Headache: Secondary | ICD-10-CM

## 2016-06-01 DIAGNOSIS — R053 Chronic cough: Secondary | ICD-10-CM | POA: Insufficient documentation

## 2016-06-01 DIAGNOSIS — R519 Headache, unspecified: Secondary | ICD-10-CM

## 2016-06-01 DIAGNOSIS — R059 Cough, unspecified: Secondary | ICD-10-CM

## 2016-06-01 MED ORDER — POLYSACCHARIDE IRON COMPLEX 150 MG PO CAPS: 150.0000 mg | ORAL_CAPSULE | Freq: Every day | ORAL | 11 refills | Status: DC

## 2016-06-01 NOTE — Assessment & Plan Note (Signed)
Nl exam May be gerd related/ some heartburn symptoms  Will try otc zantac 75 mg bid Update if worse or no improvement

## 2016-06-01 NOTE — Progress Notes (Signed)
Pre visit review using our clinic review tool, if applicable. No additional management support is needed unless otherwise documented below in the visit note. 

## 2016-06-01 NOTE — Assessment & Plan Note (Signed)
Suspect from acetaminophen overuse for headache Will stop this Avoid etoh  Continue to follow  No abd pain or gi symptoms

## 2016-06-01 NOTE — Assessment & Plan Note (Signed)
With worsening iron def anemia She is unable to be compliant with OC  Has f/u with gyn this week May consider alternate tx  tx anemia with iron

## 2016-06-01 NOTE — Assessment & Plan Note (Signed)
Suspect deQuervain tenosynovitis  Will arrange appt with [facility]  Continue ice and brace

## 2016-06-01 NOTE — Assessment & Plan Note (Signed)
Suspect some degree of analgesic rebound and biggest trigger is sleep deprivation  Disc need to stop acetaminophen for rebound and also elevated lfts  Will try ibuprofen sparingly prn  Habits to reduce ha- esp sleep habits disc  Will aim for at least 7 hours of sleep= with new family rules about getting to bed Better hydration -64 oz per day  Avoid caffeine Handouts given  Update if no improvement

## 2016-06-01 NOTE — Assessment & Plan Note (Signed)
From heavy menses Will start nu iron 150 mg daily  Also balanced diet  F/u with gyn this week to address heavy menses as well

## 2016-06-01 NOTE — Progress Notes (Signed)
Subjective:    Patient ID: Courtney Pineda, female    DOB: 15-Jun-1970, 46 y.o.   MRN: [id number]  HPI Here for health maintenance exam and to review chronic medical problems    Doing ok - has a list of things going on   Her R wrist hurts -above thumb  ? If she injured it  Suspects tendonintis  Using a brace  Since august Hurts to lift things  Not taking anything for it   More headaches lately-taking tylenol  Thinks it is from fatigue avg 4-5 hours per night  2 teenage kids  Headache for at least 2 hours per day  Usually goes to bed 12-1:30 and alarm goes off at 6:40  Caffeine - 1 drink per day  Husband snores    Has been coughing frequently  occ chest discomfort  No prod cough  Throat burns a bit - ? GERD    Wt Readings from Last 3 Encounters:  06/01/16 157 lb 12 oz (71.6 kg)  01/15/16 157 lb 8 oz (71.4 kg)  12/11/15 155 lb 12.8 oz (70.7 kg)  wt is fairly stable  No time to eat healthy diet  bmi 26.8 Trying to find the time to get exercise  Feels out of shape     HIV screening - declines/ low risk   Mammogram 2/17 normal (has appt Wednesday) Self breast exam - no lumps  Periods are about the same - cannot remember to take her OC   Pap/gyn care 2/16 neg  Has gyn visit planned Thursday-still having some vaginal burning  Also very heavy menses with anemia /cannot take OC due to inability to remember it    Flu vaccine 11/17 Tetanus vaccine 12/14  BP Readings from Last 3 Encounters:  06/01/16 106/70  01/15/16 112/70  12/11/15 112/60    Hx of mild anemia  Hb is down this time from 11.3 to 10.8 Lab Results  Component Value Date   WBC 6.0 05/25/2016   HGB 10.8 (L) 05/25/2016   HCT 33.6 (L) 05/25/2016   MCV 77.2 (L) 05/25/2016   PLT 370.0 05/25/2016    Lab Results  Component Value Date   FERRITIN 5.5 (L) 05/25/2016  not taking any iron  This is low  From heavy menses    She did start some vitamin D     Hx of elevated ast /alt  Chemistry      Component Value Date/Time   NA 139 05/25/2016 0844   K 4.2 05/25/2016 0844   CL 107 05/25/2016 0844   CO2 28 05/25/2016 0844   BUN 8 05/25/2016 0844   CREATININE 0.77 05/25/2016 0844      Component Value Date/Time   CALCIUM 9.2 05/25/2016 0844   ALKPHOS 98 05/25/2016 0844   AST 46 (H) 05/25/2016 0844   ALT 47 (H) 05/25/2016 0844   BILITOT 0.4 05/25/2016 0844      Lab Results  Component Value Date   TSH 1.56 05/25/2016    Lab Results  Component Value Date   CHOL 144 05/25/2016   HDL 42.00 05/25/2016   LDLCALC 89 05/25/2016   TRIG 63.0 05/25/2016   CHOLHDL 3 05/25/2016   Patient Active Problem List   Diagnosis Date Noted  . Right wrist pain 06/01/2016  . Daily headache 06/01/2016  . Heavy menses 06/01/2016  . Cough 06/01/2016  . Pelvic pain 01/15/2016  . Low back pain 01/15/2016  . Irregular menses 03/01/2015  . Encounter for routine gynecological examination 05/23/2014  .  Dyspareunia 05/23/2014  . Burning sensation of mouth 09/12/2013  . Left knee pain 02/19/2011  . Anemia, mild 01/13/2011  . Abnormal transaminases 01/13/2011  . Routine general medical examination at a health care facility 01/05/2011  . KIDNEY STONE 03/27/2010  . MAMMOGRAM, ABNORMAL, LEFT 01/14/2009  . STRESS REACTION, ACUTE, WITH EMOTIONAL DISTURBANCE 09/13/2007  . HEADACHE 09/13/2007  . NEVUS, ATYPICAL 08/20/2006   Past Medical History:  Diagnosis Date  . Atypical moles    abnormal moles removed from neck.  . Hyperemesis gravidarum 1999  . Kidney stones    Past Surgical History:  Procedure Laterality Date  . CHOLECYSTECTOMY  06/1994  . LITHOTRIPSY  03/2006  . TONSILECTOMY, ADENOIDECTOMY, BILATERAL MYRINGOTOMY AND TUBES     myringotomy tubes  . TONSILLECTOMY     Social History  Substance Use Topics  . Smoking status: Never Smoker  . Smokeless tobacco: Never Used  . Alcohol use No   Family History  Problem Relation Age of Onset  . Migraines Mother   . Cancer  Paternal Aunt     breast cancer  . Breast cancer Paternal Aunt   . Cancer Maternal Grandmother     brain tumor/breast cancer  . Breast cancer Maternal Grandmother 42  . Heart disease Maternal Grandfather     heart problems   Allergies  Allergen Reactions  . Sulfonamide Derivatives     REACTION: Lungs close up  . Amoxicillin-Pot Clavulanate     REACTION: rash   No current outpatient prescriptions on file prior to visit.   No current facility-administered medications on file prior to visit.     Review of Systems Review of Systems  Constitutional: Negative for fever, appetite change,  and unexpected weight change.  Eyes: Negative for pain and visual disturbance.  ENT pos for pnd  Respiratory: Negative for wheeze and shortness of breath.  pos for dry cough Cardiovascular: Negative for cp or palpitations    Gastrointestinal: Negative for nausea, diarrhea and constipation. pos for burning in chest/throat (? Heartburn) MSK pos for R wrist pain  Genitourinary: Negative for urgency and frequency.  Skin: Negative for pallor or rash   Neurological: Negative for weakness, light-headedness, numbness and pos for headaches.  Hematological: Negative for adenopathy. Does not bruise/bleed easily.  Psychiatric/Behavioral: Negative for dysphoric mood. The patient is not nervous/anxious.  pos for stressors and inadequate sleep        Objective:   Physical Exam  Constitutional: She appears well-developed and well-nourished. No distress.  Well appearing   HENT:  Head: Normocephalic and atraumatic.  Right Ear: External ear normal.  Left Ear: External ear normal.  Nose: Nose normal.  Mouth/Throat: Oropharynx is clear and moist.  Throat is clear Nares are boggy   Eyes: Conjunctivae and EOM are normal. Pupils are equal, round, and reactive to light. Right eye exhibits no discharge. Left eye exhibits no discharge. No scleral icterus.  Neck: Normal range of motion. Neck supple. No JVD present.  Carotid bruit is not present. No thyromegaly present.  Cardiovascular: Normal rate, regular rhythm, normal heart sounds and intact distal pulses.  Exam reveals no gallop.   Pulmonary/Chest: Effort normal and breath sounds normal. No respiratory distress. She has no wheezes. She has no rales. She exhibits no tenderness.  Good air exch No wheeze or rales   Abdominal: Soft. Bowel sounds are normal. She exhibits no distension and no mass. There is no tenderness. There is no rebound and no guarding.  Genitourinary:  Genitourinary Comments: Will see gyn  this week for breast and pelvic exam   Musculoskeletal: She exhibits tenderness. She exhibits no edema.  Tender over lateral wrist with pos Finkelstein's test  No swelling  Nl rom all fingers   Lymphadenopathy:    She has no cervical adenopathy.  Neurological: She is alert. She has normal reflexes. No cranial nerve deficit. She exhibits normal muscle tone. Coordination normal.  Skin: Skin is warm and dry. No rash noted. No erythema. No pallor.  Lentigines diffusely  Some facial comedones   Psychiatric: She has a normal mood and affect.          Assessment & Plan:   Problem List Items Addressed This Visit      Other   Abnormal transaminases    Suspect from acetaminophen overuse for headache Will stop this Avoid etoh  Continue to follow  No abd pain or gi symptoms       Anemia, mild    From heavy menses Will start nu iron 150 mg daily  Also balanced diet  F/u with gyn this week to address heavy menses as well       Relevant Medications   iron polysaccharides (NIFEREX) 150 MG capsule   Cough    Nl exam May be gerd related/ some heartburn symptoms  Will try otc zantac 75 mg bid Update if worse or no improvement       Daily headache    Suspect some degree of analgesic rebound and biggest trigger is sleep deprivation  Disc need to stop acetaminophen for rebound and also elevated lfts  Will try ibuprofen sparingly prn    Habits to reduce ha- esp sleep habits disc  Will aim for at least 7 hours of sleep= with new family rules about getting to bed Better hydration -64 oz per day  Avoid caffeine Handouts given  Update if no improvement       Heavy menses    With worsening iron def anemia She is unable to be compliant with OC  Has f/u with gyn this week May consider alternate tx  tx anemia with iron       Right wrist pain    Suspect deQuervain tenosynovitis  Will arrange appt with sport med  Continue ice and brace       Routine general medical examination at a health care facility - Primary    Reviewed health habits including diet and exercise and skin cancer prevention Reviewed appropriate screening tests for age  Also reviewed health mt list, fam hx and immunization status , as well as social and family history   See HPI Labs reviewed  Suspect AST/ALT elevation is from acetaminophen use  Start iron for anemia and she will see gyn for heavy menses utd vaccines  Gyn and mammogram appt later this week Declines HIV screening due to low risk status

## 2016-06-01 NOTE — Assessment & Plan Note (Signed)
Reviewed health habits including diet and exercise and skin cancer prevention Reviewed appropriate screening tests for age  Also reviewed health mt list, fam hx and immunization status , as well as social and family history   See HPI Labs reviewed  Suspect AST/ALT elevation is from acetaminophen use  Start iron for anemia and she will see gyn for heavy menses utd vaccines  Gyn and mammogram appt later this week Declines HIV screening due to low risk status

## 2016-06-01 NOTE — Patient Instructions (Addendum)
For headache /due to sleep deprivation - you need to aim for more sleep  Lights out at 11:00  For occasional headache try ibuprofen with food 400 mg  Lay off tylenol  Avoid caffeine the best you can  Aim for 64 oz of water per day  Get ear plugs and get a sleep mask and use them every night  Have your husband go to bed after you   Don't forget to get your mammogram   Take your vitamin D  Get started on iron once daily  - use a stool softener if you need to   For cough- try otc zantac 75 mg twice daily - if the cause is reflux this will help

## 2016-06-03 ENCOUNTER — Ambulatory Visit
Admission: RE | Admit: 2016-06-03 | Discharge: 2016-06-03 | Disposition: A | Discharge status: Home / Self Care | Payer: BLUE CROSS/BLUE SHIELD | Source: Ambulatory Visit | Attending: Family Medicine | Admitting: Family Medicine

## 2016-06-03 ENCOUNTER — Institutional Professional Consult (permissible substitution): Payer: BLUE CROSS/BLUE SHIELD | Admitting: Family Medicine

## 2016-06-03 DIAGNOSIS — Z1231 Encounter for screening mammogram for malignant neoplasm of breast: Secondary | ICD-10-CM

## 2016-06-04 DIAGNOSIS — N9489 Other specified conditions associated with female genital organs and menstrual cycle: Secondary | ICD-10-CM | POA: Diagnosis not present

## 2016-06-04 DIAGNOSIS — N949 Unspecified condition associated with female genital organs and menstrual cycle: Secondary | ICD-10-CM | POA: Diagnosis not present

## 2016-06-04 DIAGNOSIS — N920 Excessive and frequent menstruation with regular cycle: Secondary | ICD-10-CM | POA: Diagnosis not present

## 2016-06-04 DIAGNOSIS — N760 Acute vaginitis: Secondary | ICD-10-CM | POA: Diagnosis not present

## 2016-06-04 DIAGNOSIS — B9689 Other specified bacterial agents as the cause of diseases classified elsewhere: Secondary | ICD-10-CM | POA: Diagnosis not present

## 2016-06-05 ENCOUNTER — Encounter: Payer: Self-pay | Admitting: *Deleted

## 2016-06-10 ENCOUNTER — Telehealth: Payer: Self-pay | Admitting: Obstetrics and Gynecology

## 2016-06-10 ENCOUNTER — Other Ambulatory Visit: Payer: Self-pay | Admitting: Obstetrics and Gynecology

## 2016-06-10 DIAGNOSIS — N761 Subacute and chronic vaginitis: Secondary | ICD-10-CM

## 2016-06-10 MED ORDER — CLINDAMYCIN HCL 300 MG PO CAPS: 300.0000 mg | ORAL_CAPSULE | Freq: Two times a day (BID) | ORAL | 0 refills | Status: AC

## 2016-06-10 NOTE — Telephone Encounter (Signed)
LM for pt with pos GBS on One Swab AV results. Would account for pt's burning sx. Cont flagyl Rx for BV. Rx clindamycin sent for AV. Pt allergic to PCN. F/u prn.

## 2016-06-10 NOTE — Progress Notes (Unsigned)
c 

## 2016-06-11 ENCOUNTER — Institutional Professional Consult (permissible substitution): Payer: BLUE CROSS/BLUE SHIELD | Admitting: Family Medicine

## 2016-06-17 ENCOUNTER — Ambulatory Visit (INDEPENDENT_AMBULATORY_CARE_PROVIDER_SITE_OTHER): Payer: BLUE CROSS/BLUE SHIELD | Admitting: Family Medicine

## 2016-06-17 ENCOUNTER — Encounter: Payer: Self-pay | Admitting: Family Medicine

## 2016-06-17 VITALS — BP 100/64 | HR 72 | Temp 97.8°F | Ht 64.25 in | Wt 161.2 lb

## 2016-06-17 DIAGNOSIS — M654 Radial styloid tenosynovitis [de Quervain]: Secondary | ICD-10-CM

## 2016-06-17 MED ORDER — METHYLPREDNISOLONE ACETATE 40 MG/ML IJ SUSP
20.0000 mg | Freq: Once | INTRAMUSCULAR | Status: AC
  Administered 2016-06-17: 20 mg via INTRA_ARTICULAR

## 2016-06-17 NOTE — Progress Notes (Signed)
Pre visit review using our clinic review tool, if applicable. No additional management support is needed unless otherwise documented below in the visit note. 

## 2016-06-17 NOTE — Patient Instructions (Signed)
DeQuairvain's Tenosynovitis  Get a STRESS ball, practice squeezing it. Use a RUBBER BAND, practice opening it with your fingers and thumb.   Bucket of beans, rice, or sand. Practice opening and closing your hand.

## 2016-06-17 NOTE — Progress Notes (Signed)
Dr. Frederico Hamman T. Janiyha Montufar, MD, [facility] [place], [address] Phone: [phone] Fax: [phone]  06/17/2016  Patient: Courtney Pineda, MRN: [id number], DOB: [birth year], 46 y.o.  Primary Physician:  Loura Pardon, MD   Chief Complaint  Patient presents with  . Wrist Pain    Right   Subjective:   Courtney Pineda is a 46 y.o. very pleasant female patient who presents with the following:  Has been ongoing off and on since 12/13/2015.   Pleasant patient who is been having problems with her right wrist for approximately 6 months.  She is been seen once in our office, and she was placed in a thumb spica splint, and told to use of NSAIDs.  She is this splint for an extended period of time, and ultimately now is her follow-up 6 months after her onset of symptoms.  She primarily has pain around the first and possibly the second dorsal compartment, primarily involving the thumb on the right side.  She is left-hand dominant.  Mouse bothers is a lot.   Past Medical History, Surgical History, Social History, Family History, Problem List, Medications, and Allergies have been reviewed and updated if relevant.  Patient Active Problem List   Diagnosis Date Noted  . Right wrist pain 06/01/2016  . Daily headache 06/01/2016  . Heavy menses 06/01/2016  . Cough 06/01/2016  . Encounter for routine gynecological examination 05/23/2014  . Dyspareunia 05/23/2014  . Burning sensation of mouth 09/12/2013  . Anemia, mild 01/13/2011  . Abnormal transaminases 01/13/2011  . Routine general medical examination at a health care facility 01/05/2011  . KIDNEY STONE 03/27/2010  . STRESS REACTION, ACUTE, WITH EMOTIONAL DISTURBANCE 09/13/2007  . HEADACHE 09/13/2007  . NEVUS, ATYPICAL 08/20/2006    Past Medical History:  Diagnosis Date  . Atypical moles    abnormal moles removed from neck.  . Hyperemesis gravidarum 1999  . Kidney  stones     Past Surgical History:  Procedure Laterality Date  . CHOLECYSTECTOMY  06/1994  . LITHOTRIPSY  03/2006  . TONSILECTOMY, ADENOIDECTOMY, BILATERAL MYRINGOTOMY AND TUBES     myringotomy tubes  . TONSILLECTOMY      Social History   Social History  . Marital status: Married    Spouse name: N/A  . Number of children: N/A  . Years of education: N/A   Occupational History  . Not on file.   Social History Main Topics  . Smoking status: Never Smoker  . Smokeless tobacco: Never Used  . Alcohol use No  . Drug use: No  . Sexual activity: Not on file   Other Topics Concern  . Not on file   Social History Narrative  . No narrative on file    Family History  Problem Relation Age of Onset  . Migraines Mother   . Cancer Paternal Aunt     breast cancer  . Breast cancer Paternal Aunt   . Cancer Maternal Grandmother     brain tumor/breast cancer  . Breast cancer Maternal Grandmother 58  . Heart disease Maternal Grandfather     heart problems    Allergies  Allergen Reactions  . Sulfonamide Derivatives     REACTION: Lungs close up  . Amoxicillin-Pot Clavulanate     REACTION: rash    Medication list reviewed and updated in full in [place].  GEN: No fevers, chills. Nontoxic. Primarily MSK c/o today. MSK: Detailed in the HPI GI: tolerating  PO intake without difficulty Neuro: No numbness, parasthesias, or tingling associated. Otherwise the pertinent positives of the ROS are noted above.   Objective:   BP 100/64   Pulse 72   Temp 97.8 F (36.6 C) (Oral)   Ht 5' 4.25" (1.632 m)   Wt 161 lb 4 oz (73.1 kg)   LMP 06/07/2016   BMI 27.46 kg/m    GEN: WDWN, NAD, Non-toxic, Alert & Oriented x 3 HEENT: Atraumatic, Normocephalic.  Ears and Nose: No external deformity. EXTR: No clubbing/cyanosis/edema NEURO: Normal gait.  PSYCH: Normally interactive. Conversant. Not depressed or anxious appearing.  Calm demeanor.   Hand: R Ecchymosis or edema:  neg ROM wrist/hand/digits/elbow: full  Carpals, MCP's, digits: NT Distal Ulna and Radius: NT Supination lift test: neg Ecchymosis or edema: neg Cysts/nodules: neg Finkelstein's test: pos Snuffbox tenderness: neg Scaphoid tubercle: NT Hook of Hamate: NT Resisted supination: NT Full composite fist Grip, all digits: 4/5 str No tenosynovitis Axial load test: neg Phalen's: neg Tinel's: neg Atrophy: neg  Hand sensation: intact   Radiology:  Assessment and Plan:   De Quervain's tenosynovitis, right - Plan: methylPREDNISolone acetate (DEPO-MEDROL) injection 20 mg  We reviewed the hand anatomy reviewed the components involved.  Ice bid for 2-3 days at a minimum Place the thumb in a thumb spica splint - rarely at this point.  Hand exercises in a week or so - stress ball then opening hand with rubber bands  DeQuervain's Tenosynovitis Injection, R Verbal consent was obtained. Risks (including rare risk of infection, risk of skin atrophy, risk of skin bleaching), benefits, and alternatives were discussed. Prepped with Chloraprep and Ethyl Chloride used for anesthesia. Under sterile conditions, after tendons identified patient injected 1 1/2 cm proximal to radial styloid. Aspiration yields no blood. Decreased pain post injection. No complications. Needle size: 22 gauge Injection: 1/2 cc of Lidocaine 1% and Depo-Medrol 20 mg   Follow-up: 6 weeks if needed  Meds ordered this encounter  Medications  . methylPREDNISolone acetate (DEPO-MEDROL) injection 20 mg   Signed,  Azreal Stthomas T. Annalucia Laino, MD   Allergies as of 06/17/2016      Reactions   Sulfonamide Derivatives    REACTION: Lungs close up   Amoxicillin-pot Clavulanate    REACTION: rash      Medication List       Accurate as of 06/17/16 11:59 PM. Always use your most recent med list.          clindamycin 300 MG capsule Commonly known as:  CLEOCIN Take 1 capsule (300 mg total) by mouth 2 (two) times daily.   iron  polysaccharides 150 MG capsule Commonly known as:  NIFEREX Take 1 capsule (150 mg total) by mouth daily.

## 2016-07-14 ENCOUNTER — Telehealth: Payer: Self-pay | Admitting: Family Medicine

## 2016-07-14 DIAGNOSIS — R109 Unspecified abdominal pain: Secondary | ICD-10-CM | POA: Diagnosis not present

## 2016-07-14 DIAGNOSIS — R102 Pelvic and perineal pain: Secondary | ICD-10-CM | POA: Diagnosis not present

## 2016-07-14 NOTE — Telephone Encounter (Signed)
°  Patient Name: Courtney Pineda  DOB: 04-09-1971    Initial Comment Caller states that she feels bloated, tightness in abdominal region, and urinating a lot.    Nurse Assessment  Nurse: Julien Girt, RN, Almyra Free Date/Time Eilene Ghazi Time): 07/14/2016 1:41:00 PM  Confirm and document reason for call. If symptomatic, describe symptoms. ---Caller states she feels "bloated", has upper abdominal "tightness" and pain and urinary frequency. Sx began on Saturday. No fever.  Does the patient have any new or worsening symptoms? ---Yes  Will a triage be completed? ---Yes  Related visit to physician within the last 2 weeks? ---No  Does the PT have any chronic conditions? (i.e. diabetes, asthma, etc.) ---Yes  List chronic conditions. ---Anemia  Is the patient pregnant or possibly pregnant? (Ask all females between the ages of 66-55) ---No  Is this a behavioral health or substance abuse call? ---No     Guidelines    Guideline Title Affirmed Question Affirmed Notes  Abdominal Pain - Upper [1] MILD-MODERATE pain AND [2] constant AND [3] present > 2 hours    Final Disposition User   See Physician within 4 Hours (or PCP triage) Julien Girt, RN, Almyra Free    Referrals  [place] - SPECIFY   Disagree/Comply: Comply

## 2016-07-14 NOTE — Telephone Encounter (Signed)
I spoke with pt and she is going to UC when gets off work at [employer].

## 2016-07-27 ENCOUNTER — Ambulatory Visit (INDEPENDENT_AMBULATORY_CARE_PROVIDER_SITE_OTHER): Payer: BLUE CROSS/BLUE SHIELD | Admitting: Family Medicine

## 2016-07-27 ENCOUNTER — Encounter: Payer: Self-pay | Admitting: Family Medicine

## 2016-07-27 VITALS — BP 100/60 | HR 60 | Temp 98.3°F | Ht 64.25 in | Wt 159.0 lb

## 2016-07-27 DIAGNOSIS — D649 Anemia, unspecified: Secondary | ICD-10-CM

## 2016-07-27 DIAGNOSIS — R1084 Generalized abdominal pain: Secondary | ICD-10-CM | POA: Diagnosis not present

## 2016-07-27 DIAGNOSIS — R1013 Epigastric pain: Secondary | ICD-10-CM | POA: Diagnosis not present

## 2016-07-27 DIAGNOSIS — R102 Pelvic and perineal pain: Secondary | ICD-10-CM

## 2016-07-27 DIAGNOSIS — R748 Abnormal levels of other serum enzymes: Secondary | ICD-10-CM

## 2016-07-27 DIAGNOSIS — R109 Unspecified abdominal pain: Secondary | ICD-10-CM | POA: Insufficient documentation

## 2016-07-27 DIAGNOSIS — R350 Frequency of micturition: Secondary | ICD-10-CM | POA: Diagnosis not present

## 2016-07-27 LAB — COMPREHENSIVE METABOLIC PANEL
ALK PHOS: 85 U/L (ref 39–117)
ALT: 14 U/L (ref 0–35)
AST: 21 U/L (ref 0–37)
Albumin: 4.6 g/dL (ref 3.5–5.2)
BILIRUBIN TOTAL: 0.6 mg/dL (ref 0.2–1.2)
BUN: 9 mg/dL (ref 6–23)
CO2: 25 meq/L (ref 19–32)
Calcium: 9.6 mg/dL (ref 8.4–10.5)
Chloride: 106 mEq/L (ref 96–112)
Creatinine, Ser: 0.8 mg/dL (ref 0.40–1.20)
GFR: 83.08 mL/min (ref >60.00)
GLUCOSE: 94 mg/dL (ref 70–99)
Potassium: 4.2 mEq/L (ref 3.5–5.1)
Sodium: 138 mEq/L (ref 135–145)
TOTAL PROTEIN: 7.7 g/dL (ref 6.0–8.3)

## 2016-07-27 LAB — POC URINALSYSI DIPSTICK (AUTOMATED)
BILIRUBIN UA: NEGATIVE
GLUCOSE UA: NEGATIVE
Ketones, UA: NEGATIVE
LEUKOCYTES UA: NEGATIVE
NITRITE UA: NEGATIVE
Protein, UA: NEGATIVE
Spec Grav, UA: 1.015 (ref 1.010–1.025)
UROBILINOGEN UA: 0.2 U/dL
pH, UA: 6 (ref 5.0–8.0)

## 2016-07-27 LAB — AMYLASE: Amylase: 56 U/L (ref 27–131)

## 2016-07-27 LAB — CBC WITH DIFFERENTIAL/PLATELET
BASOS ABS: 0.1 10*3/uL (ref 0.0–0.1)
Basophils Relative: 0.8 % (ref 0.0–3.0)
EOS ABS: 0.1 10*3/uL (ref 0.0–0.7)
Eosinophils Relative: 1 % (ref 0.0–5.0)
HCT: 39.9 % (ref 36.0–46.0)
Hemoglobin: 12.5 g/dL (ref 12.0–15.0)
LYMPHS ABS: 2.4 10*3/uL (ref 0.7–4.0)
LYMPHS PCT: 31.6 % (ref 12.0–46.0)
MCHC: 31.5 g/dL (ref 30.0–36.0)
MCV: 78.6 fl (ref 78.0–100.0)
MONOS PCT: 8.1 % (ref 3.0–12.0)
Monocytes Absolute: 0.6 10*3/uL (ref 0.1–1.0)
NEUTROS PCT: 58.5 % (ref 43.0–77.0)
Neutro Abs: 4.5 10*3/uL (ref 1.4–7.7)
Platelets: 347 10*3/uL (ref 150.0–400.0)
RBC: 5.07 Mil/uL (ref 3.87–5.11)
RDW: 17.8 % — ABNORMAL HIGH (ref 11.5–15.5)
WBC: 7.8 10*3/uL (ref 4.0–10.5)

## 2016-07-27 LAB — LIPASE: Lipase: 34 U/L (ref 11.0–59.0)

## 2016-07-27 LAB — POCT URINE PREGNANCY: PREG TEST UR: NEGATIVE

## 2016-07-27 LAB — HCG, QUANTITATIVE, PREGNANCY: QUANTITATIVE HCG: 0 m[IU]/mL

## 2016-07-27 MED ORDER — RANITIDINE HCL 150 MG PO TABS: 150.0000 mg | ORAL_TABLET | Freq: Two times a day (BID) | ORAL | 11 refills | Status: DC

## 2016-07-27 NOTE — Progress Notes (Signed)
Pre visit review using our clinic review tool, if applicable. No additional management support is needed unless otherwise documented below in the visit note. 

## 2016-07-27 NOTE — Assessment & Plan Note (Signed)
s/p w/u in the fall by gyn (tx for BV) Urine preg today-one test was ? Ft pos vs artifact and 2nd one neg  Husband had vasectomy (preg unlikely)- serum preg test ordered  Menses are labile-  ? Perimenopause  Lab today  Poss consider CT  No bowel changes

## 2016-07-27 NOTE — Assessment & Plan Note (Signed)
UA pos for blood only (on menses) ? If rel to abd/pelvic discomfort Labs today

## 2016-07-27 NOTE — Patient Instructions (Signed)
Labs today  Urine tests today  Take ranitidine 150 mg twice daily for acid reflux and gastritis  Eat bland Make sure to drink enough fluids  If labs are normal and you do not improve I would consider a CT scan

## 2016-07-27 NOTE — Assessment & Plan Note (Signed)
Unsure if causing her upper abd pain  Start ranitidine Lab for cmet/amy/lip and pend results  Hx of susp fatty liver

## 2016-07-27 NOTE — Assessment & Plan Note (Signed)
Diffuse-some upper and some pelvic/low On iron for anemia Disc gyn and gi differential  Labs today  If all nl may consider CT tx dyspepsia with ranitidine

## 2016-07-27 NOTE — Assessment & Plan Note (Signed)
Re check today Some abd discomfort Nl exam

## 2016-07-27 NOTE — Assessment & Plan Note (Signed)
From heavy menses in the past  Cbc today  On iron

## 2016-07-27 NOTE — Progress Notes (Signed)
Subjective:    Patient ID: Courtney Pineda, female    DOB: 03-Mar-1971, 46 y.o.   MRN: [id number]  HPI Here for abdominal symptoms  She was seen at [facility] for this 4/3 diag (per note) with unspecified abd pain and pelvic/perineal pain   She has discomfort from the ribs all the way to pubis  Pain can be sharp or dull Cannot get comfortable  Just uncomfortable/bloated  Pain in pelvic area   She was urinating frequently-this is on and off  Just started menses on Sat- it is lighter than usual  Feels like the lower part of abdomen is pushing up   Saw gyn at [place] [facility] - in feb  Was tx for bacterial vaginitis - unsure if that helped   Does not think she is pregnant   Neg UA Neg preg test (per pt ) Period was normal -end of feb - they have been intermittently heavy -but skipped in march  Husband had vasectomy   No vag d/c No itching  Not sexually active much lately (it was uncomfortable)    Declined std screen   Wt Readings from Last 3 Encounters:  07/27/16 159 lb (72.1 kg)  06/17/16 161 lb 4 oz (73.1 kg)  06/01/16 157 lb 12 oz (71.6 kg)    Last labs in feb Lab Results  Component Value Date   ALT 47 (H) 05/25/2016   AST 46 (H) 05/25/2016   ALKPHOS 98 05/25/2016   BILITOT 0.4 05/25/2016   Lab Results  Component Value Date   WBC 6.0 05/25/2016   HGB 10.8 (L) 05/25/2016   HCT 33.6 (L) 05/25/2016   MCV 77.2 (L) 05/25/2016   PLT 370.0 05/25/2016    Takes iron for heavy menses Neg u preg in fall  Nl pelvic ultrasound in the fall   Nl pap 2/16 with neg vaginitis tests    She has felt a little nauseated on and off  Some acid reflux symptoms on and off (can feel acid in throat)   Normal BMS- regular at least once per day  No blood  Dark due to iron /black in color    Today Results for orders placed or performed in visit on 07/27/16  POCT Urinalysis Dipstick (Automated)  Result Value Ref Range   Color, UA yellow    Clarity, UA hazy    Glucose, UA  negative    Bilirubin, UA negative    Ketones, UA negative    Spec Grav, UA 1.015 1.010 - 1.025   Blood, UA 3+    pH, UA 6.0 5.0 - 8.0   Protein, UA negative    Urobilinogen, UA 0.2 0.2 or 1.0 E.U./dL   Nitrite, UA negative    Leukocytes, UA Negative Negative  POCT urine pregnancy  Result Value Ref Range   Preg Test, Ur Negative Negative   (first upreg had a line on it that looked more like a water mark - this was repeated as neg) Will confirm with serum preg test   Patient Active Problem List   Diagnosis Date Noted  . Abdominal pain 07/27/2016  . Frequent urination 07/27/2016  . Dyspepsia 07/27/2016  . Right wrist pain 06/01/2016  . Daily headache 06/01/2016  . Heavy menses 06/01/2016  . Cough 06/01/2016  . Pelvic pain in female 01/15/2016  . Encounter for routine gynecological examination 05/23/2014  . Dyspareunia 05/23/2014  . Burning sensation of mouth 09/12/2013  . Anemia, mild 01/13/2011  . Abnormal transaminases 01/13/2011  . Routine  general medical examination at a health care facility 01/05/2011  . KIDNEY STONE 03/27/2010  . STRESS REACTION, ACUTE, WITH EMOTIONAL DISTURBANCE 09/13/2007  . HEADACHE 09/13/2007  . NEVUS, ATYPICAL 08/20/2006   Past Medical History:  Diagnosis Date  . Atypical moles    abnormal moles removed from neck.  . Hyperemesis gravidarum 1999  . Kidney stones    Past Surgical History:  Procedure Laterality Date  . CHOLECYSTECTOMY  06/1994  . LITHOTRIPSY  03/2006  . TONSILECTOMY, ADENOIDECTOMY, BILATERAL MYRINGOTOMY AND TUBES     myringotomy tubes  . TONSILLECTOMY     Social History  Substance Use Topics  . Smoking status: Never Smoker  . Smokeless tobacco: Never Used  . Alcohol use No   Family History  Problem Relation Age of Onset  . Migraines Mother   . Cancer Paternal Aunt     breast cancer  . Breast cancer Paternal Aunt   . Cancer Maternal Grandmother     brain tumor/breast cancer  . Breast cancer Maternal Grandmother  37  . Heart disease Maternal Grandfather     heart problems   Allergies  Allergen Reactions  . Sulfonamide Derivatives     REACTION: Lungs close up  . Amoxicillin-Pot Clavulanate     REACTION: rash   Current Outpatient Prescriptions on File Prior to Visit  Medication Sig Dispense Refill  . iron polysaccharides (NIFEREX) 150 MG capsule Take 1 capsule (150 mg total) by mouth daily. 30 capsule 11   No current facility-administered medications on file prior to visit.     Review of Systems Review of Systems  Constitutional: Negative for fever, appetite change, fatigue and unexpected weight change.  Eyes: Negative for pain and visual disturbance.  Respiratory: Negative for cough and shortness of breath.   Cardiovascular: Negative for cp or palpitations    Gastrointestinal: Negative for nausea, diarrhea and constipation. pos for abd pain (diffuse) and pelvic pain and some heartburn, neg for blood in stool Genitourinary: pos for urgency and frequency. neg for dysuria or hematuria , neg for flank pain  Skin: Negative for pallor or rash   Neurological: Negative for weakness, light-headedness, numbness and headaches.  Hematological: Negative for adenopathy. Does not bruise/bleed easily.  Psychiatric/Behavioral: Negative for dysphoric mood. The patient is not nervous/anxious.         Objective:   Physical Exam  Constitutional: She appears well-developed and well-nourished. No distress.  Well appearing   HENT:  Head: Normocephalic and atraumatic.  Mouth/Throat: Oropharynx is clear and moist.  Eyes: Conjunctivae and EOM are normal. Pupils are equal, round, and reactive to light. No scleral icterus.  Neck: Normal range of motion. Neck supple.  Cardiovascular: Normal rate, regular rhythm and normal heart sounds.   Pulmonary/Chest: Effort normal and breath sounds normal. No respiratory distress. She has no wheezes. She has no rales.  Abdominal: Soft. Bowel sounds are normal. She exhibits no  distension, no pulsatile liver, no fluid wave, no abdominal bruit, no ascites, no pulsatile midline mass and no mass. There is no hepatosplenomegaly. There is tenderness in the epigastric area and suprapubic area. There is no rebound, no guarding, no CVA tenderness, no tenderness at McBurney's point and negative Murphy's sign.  Mild tenderness-epigastric and suprapubic No M noted   Lymphadenopathy:    She has no cervical adenopathy.  Neurological: She is alert.  Skin: Skin is warm and dry. No erythema. No pallor.  Psychiatric: She has a normal mood and affect.  Seems mildly stressed but talkative/cheerful  Assessment & Plan:   Problem List Items Addressed This Visit      Other   Abdominal pain    Diffuse-some upper and some pelvic/low On iron for anemia Disc gyn and gi differential  Labs today  If all nl may consider CT tx dyspepsia with ranitidine       Relevant Orders   CBC with Differential/Platelet   Comprehensive metabolic panel   Amylase   Lipase   POCT Urinalysis Dipstick (Automated) (Completed)   POCT urine pregnancy (Completed)   Abnormal transaminases - Primary    Re check today Some abd discomfort Nl exam      Relevant Orders   Comprehensive metabolic panel   Anemia, mild    From heavy menses in the past  Cbc today  On iron       Relevant Orders   CBC with Differential/Platelet   Dyspepsia    Unsure if causing her upper abd pain  Start ranitidine Lab for cmet/amy/lip and pend results  Hx of susp fatty liver       Frequent urination    UA pos for blood only (on menses) ? If rel to abd/pelvic discomfort Labs today      Pelvic pain in female    s/p w/u in the fall by gyn (tx for BV) Urine preg today-one test was ? Ft pos vs artifact and 2nd one neg  Husband had vasectomy (preg unlikely)- serum preg test ordered  Menses are labile-  ? Perimenopause  Lab today  Poss consider CT  No bowel changes       Relevant Orders   POCT  Urinalysis Dipstick (Automated) (Completed)   POCT urine pregnancy (Completed)   hCG, quantitative, pregnancy

## 2016-07-28 ENCOUNTER — Telehealth: Payer: Self-pay | Admitting: Family Medicine

## 2016-07-28 DIAGNOSIS — R102 Pelvic and perineal pain: Secondary | ICD-10-CM

## 2016-07-28 DIAGNOSIS — R1084 Generalized abdominal pain: Secondary | ICD-10-CM

## 2016-07-28 NOTE — Telephone Encounter (Signed)
-----   Message from Tammi Sou, [place] sent at 07/28/2016  4:49 PM EDT ----- Pt did view results on Mychart, she is okay with referral for CT scan, pt would like to go to [facility] if possible, I advise pt our [facility] will call to schedule appt., please put referral in

## 2016-07-28 NOTE — Telephone Encounter (Signed)
Order done Will route to [facility] 

## 2016-08-03 ENCOUNTER — Ambulatory Visit
Admission: RE | Admit: 2016-08-03 | Discharge: 2016-08-03 | Disposition: A | Discharge status: Home / Self Care | Payer: BLUE CROSS/BLUE SHIELD | Source: Ambulatory Visit | Attending: Family Medicine | Admitting: Family Medicine

## 2016-08-03 DIAGNOSIS — R1084 Generalized abdominal pain: Secondary | ICD-10-CM | POA: Insufficient documentation

## 2016-08-03 DIAGNOSIS — D251 Intramural leiomyoma of uterus: Secondary | ICD-10-CM | POA: Insufficient documentation

## 2016-08-03 DIAGNOSIS — K760 Fatty (change of) liver, not elsewhere classified: Secondary | ICD-10-CM | POA: Insufficient documentation

## 2016-08-03 DIAGNOSIS — N200 Calculus of kidney: Secondary | ICD-10-CM | POA: Insufficient documentation

## 2016-08-03 DIAGNOSIS — N281 Cyst of kidney, acquired: Secondary | ICD-10-CM | POA: Insufficient documentation

## 2016-08-03 DIAGNOSIS — K429 Umbilical hernia without obstruction or gangrene: Secondary | ICD-10-CM | POA: Insufficient documentation

## 2016-08-03 DIAGNOSIS — D259 Leiomyoma of uterus, unspecified: Secondary | ICD-10-CM | POA: Diagnosis not present

## 2016-08-03 DIAGNOSIS — R102 Pelvic and perineal pain: Secondary | ICD-10-CM | POA: Insufficient documentation

## 2016-08-03 MED ORDER — IOPAMIDOL (ISOVUE-300) INJECTION 61%
100.0000 mL | Freq: Once | INTRAVENOUS | Status: AC | PRN
  Administered 2016-08-03: 100 mL via INTRAVENOUS

## 2016-08-04 ENCOUNTER — Telehealth: Payer: Self-pay | Admitting: Family Medicine

## 2016-08-04 NOTE — Telephone Encounter (Signed)
Patient returned Waynetta's call about CT results.

## 2016-08-04 NOTE — Telephone Encounter (Signed)
Left voicemail requesting pt to call and update [place] on how she's feeling

## 2016-08-11 NOTE — Telephone Encounter (Signed)
Pt notified of Dr. Marliss Coots recommendations and verbalized understanding. Pt doesn't think she needs a referral she will call them directly to make an appt

## 2016-08-11 NOTE — Telephone Encounter (Signed)
I recommend she return to [facility] so they can evaluate further-especially now in light of her long menses Does she want to make her own appt or need a referral?

## 2016-08-11 NOTE — Telephone Encounter (Signed)
Pt called back. She said her stomach is still bothering her. Also, she has been on her period for 2 1/2 wks now. A couple of days light and some days heavy. Please advise.

## 2016-08-13 ENCOUNTER — Telehealth: Payer: Self-pay | Admitting: Family Medicine

## 2016-08-13 DIAGNOSIS — R102 Pelvic and perineal pain: Secondary | ICD-10-CM

## 2016-08-13 DIAGNOSIS — N920 Excessive and frequent menstruation with regular cycle: Secondary | ICD-10-CM

## 2016-08-13 NOTE — Telephone Encounter (Signed)
Ref done  Will route to [facility] 

## 2016-08-13 NOTE — Telephone Encounter (Signed)
Patient said Dr.Tower recommended patient make an appointment with Ashley Mariner, [facility].  Patient tried to make an appointment, but was told she needs a referral before they'll schedule an appointment.  Patient is already established with Dr.Copeland.

## 2016-08-21 ENCOUNTER — Encounter: Payer: Self-pay | Admitting: Obstetrics and Gynecology

## 2016-08-21 ENCOUNTER — Ambulatory Visit (INDEPENDENT_AMBULATORY_CARE_PROVIDER_SITE_OTHER): Payer: BLUE CROSS/BLUE SHIELD | Admitting: Obstetrics and Gynecology

## 2016-08-21 VITALS — BP 122/74 | Ht 64.0 in | Wt 156.0 lb

## 2016-08-21 DIAGNOSIS — N938 Other specified abnormal uterine and vaginal bleeding: Secondary | ICD-10-CM | POA: Diagnosis not present

## 2016-08-21 DIAGNOSIS — Z113 Encounter for screening for infections with a predominantly sexual mode of transmission: Secondary | ICD-10-CM

## 2016-08-21 DIAGNOSIS — N941 Unspecified dyspareunia: Secondary | ICD-10-CM

## 2016-08-21 NOTE — Progress Notes (Signed)
Chief Complaint  Patient presents with  . Menstrual Problem    HPI:      [name] is a 46 y.o. No obstetric history on file. who LMP was Patient's last menstrual period was 07/26/2016 (exact date)., presents today for DUB this month. Menses are usually monthly, lasting 5-7 days with heavy flow and clots. She skipped 3/18 menses and then started bleeding 4/18. Bleeding lasted most of the month, lightened up a few days, and has now restarted. She has tried ibup for dysmen with some relief. She has never had these sx before. She is sex active and pt's husband has had vasectomy.  Pt had neg UPT with PCP.  She has also had abd/pelvic pain this past month and had a neg eval with Dr. Glori Bickers, including neg labs. CT scan 08/03/16 showed poss leio (although 10/17 u/s did not see any evid of leio). EM=2.57mm on 10/17 u/s.  Pt had normal TSH 2/18.  Pt is under increased stress with child graduating from [place] this month.  PCP labs, notes, 10/17 u/s, and 4/18 CT scan reviewed.  Pt's vaginal burning from 2/18 didn't resolve with clindamycin treatment (culture showed AV). Had also discussed changing soaps, using lubricants, but pt hasn't done that yet. No sex 4/18 due to bleeding.     Patient Active Problem List   Diagnosis Date Noted  . Abdominal pain 07/27/2016  . Frequent urination 07/27/2016  . Dyspepsia 07/27/2016  . Right wrist pain 06/01/2016  . Daily headache 06/01/2016  . Heavy menses 06/01/2016  . Cough 06/01/2016  . Pelvic pain in female 01/15/2016  . Encounter for routine gynecological examination 05/23/2014  . Dyspareunia 05/23/2014  . Burning sensation of mouth 09/12/2013  . Anemia, mild 01/13/2011  . Abnormal transaminases 01/13/2011  . Routine general medical examination at a health care facility 01/05/2011  . KIDNEY STONE 03/27/2010  . STRESS REACTION, ACUTE, WITH EMOTIONAL DISTURBANCE 09/13/2007  . HEADACHE 09/13/2007  . NEVUS, ATYPICAL 08/20/2006    Family  History  Problem Relation Age of Onset  . Migraines Mother   . Cancer Paternal Aunt        breast cancer  . Breast cancer Paternal Aunt   . Cancer Maternal Grandmother        brain tumor/breast cancer  . Breast cancer Maternal Grandmother 1  . Heart disease Maternal Grandfather        heart problems    Social History   Social History  . Marital status: Married    Spouse name: N/A  . Number of children: N/A  . Years of education: N/A   Occupational History  . Not on file.   Social History Main Topics  . Smoking status: Never Smoker  . Smokeless tobacco: Never Used  . Alcohol use No  . Drug use: No  . Sexual activity: Yes    Birth control/ protection: None   Other Topics Concern  . Not on file   Social History Narrative  . No narrative on file     Current Outpatient Prescriptions:  .  iron polysaccharides (NIFEREX) 150 MG capsule, Take 1 capsule (150 mg total) by mouth daily., Disp: 30 capsule, Rfl: 11 .  ranitidine (ZANTAC) 150 MG tablet, Take 1 tablet (150 mg total) by mouth 2 (two) times daily., Disp: 60 tablet, Rfl: 11  Review of Systems  Constitutional: Negative for fever.  Gastrointestinal: Negative for blood in stool, constipation, diarrhea, nausea and vomiting.  Genitourinary: Positive for dyspareunia, menstrual problem,  vaginal bleeding and vaginal discharge. Negative for dysuria, flank pain, frequency, hematuria, urgency and vaginal pain.  Musculoskeletal: Negative for back pain.  Skin: Negative for rash.     OBJECTIVE:   Vitals:  BP 122/74   Ht 5\' 4"  (1.626 m)   Wt 156 lb (70.8 kg)   LMP 07/26/2016 (Exact Date) Comment: Pt is on period now.  BMI 26.78 kg/m   Physical Exam  Constitutional: She is oriented to person, place, and time and well-developed, well-nourished, and in no distress. Vital signs are normal.  Genitourinary: Uterus normal, cervix normal, right adnexa normal, left adnexa normal and vulva normal. Uterus is not enlarged. Cervix  exhibits no motion tenderness and no tenderness. Right adnexum displays no mass and no tenderness. Left adnexum displays no mass and no tenderness. Vulva exhibits no erythema, no exudate, no lesion, no rash and no tenderness. Vagina exhibits no lesion. Bloody  red and vaginal discharge found.  Neurological: She is oriented to person, place, and time.  Vitals reviewed.   Assessment/Plan: DUB (dysfunctional uterine bleeding) - For 1 mo. Neg TSH/neg CT scan. Question anovulatory bleeding this cycle. Bleeding now but time for menses. F/u in 1 wk if sx persist for EMB and tx discussion.  - Plan: Chlamydia/Gonococcus/Trichomonas, NAA  Screening for STD (sexually transmitted disease) - Nuswab due to DUB (although most likely neg).  Dyspareunia in female - Again, recommended Dove sens skin soap, no dryer sheets, try lubricant. F/u prn.     Return if symptoms worsen or fail to improve.  Alicia B. Copland, PA-C 08/21/2016 10:27 AM

## 2016-08-23 LAB — CHLAMYDIA/GONOCOCCUS/TRICHOMONAS, NAA
CHLAMYDIA BY NAA: NEGATIVE
GONOCOCCUS BY NAA: NEGATIVE
TRICH VAG BY NAA: NEGATIVE

## 2016-08-24 ENCOUNTER — Emergency Department: Payer: BLUE CROSS/BLUE SHIELD

## 2016-08-24 ENCOUNTER — Encounter: Payer: Self-pay | Admitting: Emergency Medicine

## 2016-08-24 ENCOUNTER — Emergency Department
Admission: EM | Admit: 2016-08-24 | Discharge: 2016-08-24 | Disposition: A | Discharge status: Home / Self Care | Payer: BLUE CROSS/BLUE SHIELD | Attending: Emergency Medicine | Admitting: Emergency Medicine

## 2016-08-24 DIAGNOSIS — R1031 Right lower quadrant pain: Secondary | ICD-10-CM | POA: Diagnosis not present

## 2016-08-24 DIAGNOSIS — R1110 Vomiting, unspecified: Secondary | ICD-10-CM | POA: Diagnosis not present

## 2016-08-24 DIAGNOSIS — D259 Leiomyoma of uterus, unspecified: Secondary | ICD-10-CM | POA: Insufficient documentation

## 2016-08-24 DIAGNOSIS — R110 Nausea: Secondary | ICD-10-CM | POA: Diagnosis not present

## 2016-08-24 DIAGNOSIS — Z79899 Other long term (current) drug therapy: Secondary | ICD-10-CM | POA: Diagnosis not present

## 2016-08-24 LAB — LIPASE, BLOOD: LIPASE: 39 U/L (ref 11–51)

## 2016-08-24 LAB — COMPREHENSIVE METABOLIC PANEL
ALT: 30 U/L (ref 14–54)
ANION GAP: 5 (ref 5–15)
AST: 44 U/L — ABNORMAL HIGH (ref 15–41)
Albumin: 3.9 g/dL (ref 3.5–5.0)
Alkaline Phosphatase: 81 U/L (ref 38–126)
BUN: 14 mg/dL (ref 6–20)
CHLORIDE: 108 mmol/L (ref 101–111)
CO2: 25 mmol/L (ref 22–32)
Calcium: 8.7 mg/dL — ABNORMAL LOW (ref 8.9–10.3)
Creatinine, Ser: 0.81 mg/dL (ref 0.44–1.00)
GFR calc non Af Amer: >60 mL/min (ref >60)
Glucose, Bld: 106 mg/dL — ABNORMAL HIGH (ref 65–99)
Potassium: 4.2 mmol/L (ref 3.5–5.1)
SODIUM: 138 mmol/L (ref 135–145)
Total Bilirubin: 0.6 mg/dL (ref 0.3–1.2)
Total Protein: 7 g/dL (ref 6.5–8.1)

## 2016-08-24 LAB — URINALYSIS, COMPLETE (UACMP) WITH MICROSCOPIC
BILIRUBIN URINE: NEGATIVE
Bacteria, UA: NONE SEEN
GLUCOSE, UA: NEGATIVE mg/dL
KETONES UR: 5 mg/dL — AB
LEUKOCYTES UA: NEGATIVE
Nitrite: NEGATIVE
PROTEIN: 30 mg/dL — AB
Specific Gravity, Urine: 1.034 — ABNORMAL HIGH (ref 1.005–1.030)
pH: 5 (ref 5.0–8.0)

## 2016-08-24 LAB — CBC
HCT: 31 % — ABNORMAL LOW (ref 35.0–47.0)
HEMOGLOBIN: 9.9 g/dL — AB (ref 12.0–16.0)
MCH: 24.5 pg — AB (ref 26.0–34.0)
MCHC: 32 g/dL (ref 32.0–36.0)
MCV: 76.4 fL — AB (ref 80.0–100.0)
Platelets: 346 10*3/uL (ref 150–440)
RBC: 4.05 MIL/uL (ref 3.80–5.20)
RDW: 17.1 % — ABNORMAL HIGH (ref 11.5–14.5)
WBC: 11.4 10*3/uL — ABNORMAL HIGH (ref 3.6–11.0)

## 2016-08-24 LAB — POCT PREGNANCY, URINE: Preg Test, Ur: NEGATIVE

## 2016-08-24 MED ORDER — IOPAMIDOL (ISOVUE-300) INJECTION 61%
100.0000 mL | Freq: Once | INTRAVENOUS | Status: AC | PRN
  Administered 2016-08-24: 100 mL via INTRAVENOUS

## 2016-08-24 MED ORDER — HYDROCODONE-ACETAMINOPHEN 5-300 MG PO TABS: 1.0000 | ORAL_TABLET | Freq: Four times a day (QID) | ORAL | 0 refills | Status: DC | PRN

## 2016-08-24 MED ORDER — OXYCODONE-ACETAMINOPHEN 5-325 MG PO TABS
1.0000 | ORAL_TABLET | ORAL | Status: DC | PRN
  Administered 2016-08-24: 1 via ORAL
  Filled 2016-08-24: qty 1

## 2016-08-24 MED ORDER — ONDANSETRON 4 MG PO TBDP
4.0000 mg | ORAL_TABLET | Freq: Once | ORAL | Status: AC | PRN
  Administered 2016-08-24: 4 mg via ORAL
  Filled 2016-08-24: qty 1

## 2016-08-24 MED ORDER — ONDANSETRON 4 MG PO TBDP: 4.0000 mg | ORAL_TABLET | Freq: Three times a day (TID) | ORAL | 0 refills | Status: DC | PRN

## 2016-08-24 NOTE — ED Provider Notes (Signed)
[facility] Emergency Department Provider Note  ____________________________________________   First MD Initiated Contact with Patient 08/24/16 [phone]     (approximate)  I have reviewed the triage vital signs and the nursing notes.   HISTORY  Chief Complaint Abdominal Pain    HPI Courtney Pineda is a 46 y.o. female who self presents to the emergency department with moderate to severe cramping right lower quadrant pain for the past day or so. Pain is been constant nonradiating. She has a previous history of nephrolithiasis who says this feels different. She has no back pain or flank pain. No fevers or chills. Some nausea but no vomiting. She also has a history of uterine fibroids and has had menorrhagia for the past several weeks.   Past Medical History:  Diagnosis Date  . Atypical moles    abnormal moles removed from neck.  . Hyperemesis gravidarum 1999  . Kidney stones     Patient Active Problem List   Diagnosis Date Noted  . Abdominal pain 07/27/2016  . Frequent urination 07/27/2016  . Dyspepsia 07/27/2016  . Right wrist pain 06/01/2016  . Daily headache 06/01/2016  . Heavy menses 06/01/2016  . Cough 06/01/2016  . Pelvic pain in female 01/15/2016  . Encounter for routine gynecological examination 05/23/2014  . Dyspareunia 05/23/2014  . Burning sensation of mouth 09/12/2013  . Anemia, mild 01/13/2011  . Abnormal transaminases 01/13/2011  . Routine general medical examination at a health care facility 01/05/2011  . KIDNEY STONE 03/27/2010  . STRESS REACTION, ACUTE, WITH EMOTIONAL DISTURBANCE 09/13/2007  . HEADACHE 09/13/2007  . NEVUS, ATYPICAL 08/20/2006    Past Surgical History:  Procedure Laterality Date  . CHOLECYSTECTOMY  06/1994  . LITHOTRIPSY  03/2006  . TONSILECTOMY, ADENOIDECTOMY, BILATERAL MYRINGOTOMY AND TUBES     myringotomy tubes  . TONSILLECTOMY      Prior to Admission medications   Medication Sig Start Date End  Date Taking? Authorizing Provider  Hydrocodone-Acetaminophen (VICODIN) 5-300 MG TABS Take 1 tablet by mouth every 6 (six) hours as needed (severe pain). 08/24/16   Darel Hong, MD  iron polysaccharides (NIFEREX) 150 MG capsule Take 1 capsule (150 mg total) by mouth daily. 06/01/16   Tower, Wynelle Fanny, MD  ondansetron (ZOFRAN ODT) 4 MG disintegrating tablet Take 1 tablet (4 mg total) by mouth every 8 (eight) hours as needed for nausea or vomiting. 08/24/16   Darel Hong, MD  ranitidine (ZANTAC) 150 MG tablet Take 1 tablet (150 mg total) by mouth 2 (two) times daily. 07/27/16   Tower, Wynelle Fanny, MD    Allergies Sulfonamide derivatives and Amoxicillin-pot clavulanate  Family History  Problem Relation Age of Onset  . Migraines Mother   . Cancer Paternal Aunt        breast cancer  . Breast cancer Paternal Aunt   . Cancer Maternal Grandmother        brain tumor/breast cancer  . Breast cancer Maternal Grandmother 35  . Heart disease Maternal Grandfather        heart problems    Social History Social History  Substance Use Topics  . Smoking status: Never Smoker  . Smokeless tobacco: Never Used  . Alcohol use No    Review of Systems Constitutional: No fever/chills Eyes: No visual changes. ENT: No sore throat. Cardiovascular: Denies chest pain. Respiratory: Denies shortness of breath. Gastrointestinal: Positive abdominal pain.  Positive nausea, no vomiting.  No diarrhea.  No constipation. Genitourinary: Negative for dysuria. Musculoskeletal: Negative for back pain.  Skin: Negative for rash. Neurological: Negative for headaches, focal weakness or numbness.  10-point ROS otherwise negative.  ____________________________________________   PHYSICAL EXAM:  VITAL SIGNS: ED Triage Vitals  Enc Vitals Group     BP 08/24/16 0115 114/72     Pulse Rate 08/24/16 0115 71     Resp 08/24/16 0115 18     Temp 08/24/16 0115 98.4 F (36.9 C)     Temp Source 08/24/16 0115 Oral     SpO2  08/24/16 0115 99 %     Weight 08/24/16 0115 156 lb (70.8 kg)     Height 08/24/16 0115 5\' 4"  (1.626 m)     Head Circumference --      Peak Flow --      Pain Score 08/24/16 0125 10     Pain Loc --      Pain Edu? --      Excl. in [place]? --     Constitutional: Alert and oriented x 4 well appearing nontoxic no diaphoresis speaks in full, clear sentences Eyes: PERRL EOMI. Head: Atraumatic. Nose: No congestion/rhinnorhea. Mouth/Throat: No trismus Neck: No stridor.   Cardiovascular: Normal rate, regular rhythm. Grossly normal heart sounds.  Good peripheral circulation. Respiratory: Normal respiratory effort.  No retractions. Lungs CTAB and moving good air Gastrointestinal: Soft nondistended she is tender in her right lower quadrant with some guarding but no frank peritonitis Musculoskeletal: No lower extremity edema   Neurologic:  Normal speech and language. No gross focal neurologic deficits are appreciated. Skin:  Skin is warm, dry and intact. No rash noted. Psychiatric: Mood and affect are normal. Speech and behavior are normal.    ____________________________________________   LABS (all labs ordered are listed, but only abnormal results are displayed)  Labs Reviewed  COMPREHENSIVE METABOLIC PANEL - Abnormal; Notable for the following:       Result Value   Glucose, Bld 106 (*)    Calcium 8.7 (*)    AST 44 (*)    All other components within normal limits  CBC - Abnormal; Notable for the following:    WBC 11.4 (*)    Hemoglobin 9.9 (*)    HCT 31.0 (*)    MCV 76.4 (*)    MCH 24.5 (*)    RDW 17.1 (*)    All other components within normal limits  URINALYSIS, COMPLETE (UACMP) WITH MICROSCOPIC - Abnormal; Notable for the following:    Color, Urine YELLOW (*)    APPearance CLEAR (*)    Specific Gravity, Urine 1.034 (*)    Hgb urine dipstick LARGE (*)    Ketones, ur 5 (*)    Protein, ur 30 (*)    Squamous Epithelial / LPF 0-5 (*)    All other components within normal limits    LIPASE, BLOOD  POC URINE PREG, ED  POCT PREGNANCY, URINE    Large amount of blood in the urine but she does have minimal menorrhagia __________________________________________  EKG   ____________________________________________  RADIOLOGY  CT scan shows a normal appendix and a degenerating fibroid ____________________________________________   PROCEDURES  Procedure(s) performed: no  Procedures  Critical Care performed: no  Observation: no ____________________________________________   INITIAL IMPRESSION / ASSESSMENT AND PLAN / ED COURSE  Pertinent labs & imaging results that were available during my care of the patient were reviewed by me and considered in my medical decision making (see chart for details).  The patient arrives uncomfortable appearing with right lower quadrant pain and tenderness. Differential is broad but includes  appendicitis. I discussed watchful waiting versus recurrent CT scan with the patient and she opted for CT. CT fortunately shows a normal appendix but does show a degenerating fibroid which likely explains her symptoms. Her pain is well controlled and she has OB follow-up this week. She is discharged home in improved condition.      ____________________________________________   FINAL CLINICAL IMPRESSION(S) / ED DIAGNOSES  Final diagnoses:  Uterine leiomyoma, unspecified location      NEW MEDICATIONS STARTED DURING THIS VISIT:  Discharge Medication List as of 08/24/2016  6:24 AM    START taking these medications   Details  Hydrocodone-Acetaminophen (VICODIN) 5-300 MG TABS Take 1 tablet by mouth every 6 (six) hours as needed (severe pain)., Starting Mon 08/24/2016, Print    ondansetron (ZOFRAN ODT) 4 MG disintegrating tablet Take 1 tablet (4 mg total) by mouth every 8 (eight) hours as needed for nausea or vomiting., Starting Mon 08/24/2016, Print         Note:  This document was prepared using Dragon voice recognition software  and may include unintentional dictation errors.     Darel Hong, MD 08/24/16 [phone]

## 2016-08-24 NOTE — ED Triage Notes (Signed)
Pt ambulatory to triage with no difficulty. Pt reports pain in her right lower abd that started earlier this evening. Pain radiates into her back. Pt has hx of kidney stones but states this feels worse. Pt also reports numerous recent visits with her PCP and ob gyn MD and had a ct of her abd/pelvis on 08/21/16.

## 2016-08-24 NOTE — Discharge Instructions (Signed)
If your pain lasts more than another 2 days please call your [facility] gynecologist for follow-up. Return to the emergency department sooner for any new or worsening symptoms.  It was a pleasure to take care of you today, and thank you for coming to our emergency department.  If you have any questions or concerns before leaving please ask the nurse to grab me and I'm more than happy to go through your aftercare instructions again.  If you were prescribed any opioid pain medication today such as Norco, Vicodin, Percocet, morphine, hydrocodone, or oxycodone please make sure you do not drive when you are taking this medication as it can alter your ability to drive safely.  If you have any concerns once you are home that you are not improving or are in fact getting worse before you can make it to your follow-up appointment, please do not hesitate to call 911 and come back for further evaluation.  Darel Hong MD  Results for orders placed or performed during the hospital encounter of 08/24/16  Lipase, blood  Result Value Ref Range   Lipase 39 11 - 51 U/L  Comprehensive metabolic panel  Result Value Ref Range   Sodium 138 135 - 145 mmol/L   Potassium 4.2 3.5 - 5.1 mmol/L   Chloride 108 101 - 111 mmol/L   CO2 25 22 - 32 mmol/L   Glucose, Bld 106 (H) 65 - 99 mg/dL   BUN 14 6 - 20 mg/dL   Creatinine, Ser 0.81 0.44 - 1.00 mg/dL   Calcium 8.7 (L) 8.9 - 10.3 mg/dL   Total Protein 7.0 6.5 - 8.1 g/dL   Albumin 3.9 3.5 - 5.0 g/dL   AST 44 (H) 15 - 41 U/L   ALT 30 14 - 54 U/L   Alkaline Phosphatase 81 38 - 126 U/L   Total Bilirubin 0.6 0.3 - 1.2 mg/dL   GFR calc non Af Amer >60 >60 mL/min   GFR calc Af Amer >60 >60 mL/min   Anion gap 5 5 - 15  CBC  Result Value Ref Range   WBC 11.4 (H) 3.6 - 11.0 K/uL   RBC 4.05 3.80 - 5.20 MIL/uL   Hemoglobin 9.9 (L) 12.0 - 16.0 g/dL   HCT 31.0 (L) 35.0 - 47.0 %   MCV 76.4 (L) 80.0 - 100.0 fL   MCH 24.5 (L) 26.0 - 34.0 pg   MCHC 32.0 32.0 - 36.0 g/dL   RDW  17.1 (H) 11.5 - 14.5 %   Platelets 346 150 - 440 K/uL  Urinalysis, Complete w Microscopic  Result Value Ref Range   Color, Urine YELLOW (A) YELLOW   APPearance CLEAR (A) CLEAR   Specific Gravity, Urine 1.034 (H) 1.005 - 1.030   pH 5.0 5.0 - 8.0   Glucose, UA NEGATIVE NEGATIVE mg/dL   Hgb urine dipstick LARGE (A) NEGATIVE   Bilirubin Urine NEGATIVE NEGATIVE   Ketones, ur 5 (A) NEGATIVE mg/dL   Protein, ur 30 (A) NEGATIVE mg/dL   Nitrite NEGATIVE NEGATIVE   Leukocytes, UA NEGATIVE NEGATIVE   RBC / HPF TOO NUMEROUS TO COUNT 0 - 5 RBC/hpf   WBC, UA 0-5 0 - 5 WBC/hpf   Bacteria, UA NONE SEEN NONE SEEN   Squamous Epithelial / LPF 0-5 (A) NONE SEEN   Mucous PRESENT   Pregnancy, urine POC  Result Value Ref Range   Preg Test, Ur NEGATIVE NEGATIVE   Ct Abdomen Pelvis W Contrast  Result Date: 08/24/2016 CLINICAL DATA:  Right  lower quadrant pain.  Vomiting. EXAM: CT ABDOMEN AND PELVIS WITH CONTRAST TECHNIQUE: Multidetector CT imaging of the abdomen and pelvis was performed using the standard protocol following bolus administration of intravenous contrast. CONTRAST:  168mL ISOVUE-300 IOPAMIDOL (ISOVUE-300) INJECTION 61% COMPARISON:  CT 08/03/2016 FINDINGS: Lower chest: Linear atelectasis in the lung bases. No pleural fluid. Heart is normal in size. Minimal thickening the distal esophagus. Hepatobiliary: Focal fatty infiltration adjacent to the falciform ligament. No focal lesion. Postcholecystectomy with unchanged biliary prominence, likely sequela of prior cholecystectomy. Pancreas: No ductal dilatation or inflammation. Spleen: Normal in size without focal abnormality. Adrenals/Urinary Tract: Normal adrenal glands. No hydronephrosis or perinephric edema. Nonobstructing stones in left kidney again seen. Simple cyst in the lower right kidney, unchanged from prior. Urinary bladder is decompressed. Stomach/Bowel: Possible thickening of the distal esophagus. Stomach is minimally distended. No bowel wall  thickening, inflammation or obstruction. Normal appendix. Vascular/Lymphatic: Prominent left ovarian veins. Normal caliber abdominal aorta. No abdominal or pelvic adenopathy. Reproductive: Posterior fundal fibroid measures 4.8 cm with peripheral enhancement and central hypodensity suggesting necrosis. Prominent periuterine and adnexal vascularity with dilatation of the left ovarian vein. Ovaries are normal in size. No adnexal mass. Other: Trace free fluid in the pelvis. No free air. No upper abdominal ascites. Tiny fat containing umbilical hernia. Musculoskeletal: There are no acute or suspicious osseous abnormalities. IMPRESSION: 1. Uterine fibroid measures 4.7 cm and is likely necrotic. Prominent periuterine and adnexal vascularity suggesting pelvic congestion syndrome. 2. Mild distal esophageal wall thickening, can be seen with reflux or esophagitis. 3. Normal appendix. Electronically Signed   By: Jeb Levering M.D.   On: 08/24/2016 05:58   Ct Abdomen Pelvis W Contrast  Result Date: 08/03/2016 CLINICAL DATA:  Periumbilical pain in lower abdominal discomfort for nearly 2 weeks EXAM: CT ABDOMEN AND PELVIS WITH CONTRAST TECHNIQUE: Multidetector CT imaging of the abdomen and pelvis was performed using the standard protocol following bolus administration of intravenous contrast. CONTRAST:  169mL ISOVUE-300 IOPAMIDOL (ISOVUE-300) INJECTION 61% COMPARISON:  December 16, 2010 FINDINGS: Lower chest: No acute abnormality. Hepatobiliary: Focal fatty deposition adjacent to the falciform ligament. The portal vein is normal. The patient is status post cholecystectomy. Hepatic steatosis is identified. Pancreas: Unremarkable. No pancreatic ductal dilatation or surrounding inflammatory changes. Spleen: Normal in size without focal abnormality. Adrenals/Urinary Tract: The adrenal glands are normal. Evaluation for renal stones is limited due to contrast. However, there do appear to be several nonobstructive stones in the  left kidney. No hydronephrosis or perinephric stranding. A a cyst is seen in the lower pole of the right kidney with an attenuation of 16 Hounsfield units on coronal image 65. No suspicious renal masses are seen. No perinephric stranding. No ureterectasis or ureteral stones. Stomach/Bowel: The stomach is normal in appearance. The Brancato bowel is unremarkable. The colon and appendix are normal. Vascular/Lymphatic: No significant vascular findings are present. No enlarged abdominal or pelvic lymph nodes. Reproductive: Suggestive fibroid in the fundus of the uterus. No adnexal masses. Normal appearing follicles are seen in the ovaries. Other: There is a fat containing umbilical hernia. Musculoskeletal: No acute or significant osseous findings. IMPRESSION: 1. Nonobstructive renal stones seen in the left kidney. No ureteral stones or obstruction. 2. Fibroid in the fundus of the uterus. 3. Fat containing umbilical hernia. 4. Cyst in the lower pole the right kidney. 5. Hepatic steatosis. Electronically Signed   By: Dorise Bullion III M.D   On: 08/03/2016 17:30

## 2016-08-24 NOTE — ED Notes (Signed)
Pt states that the pain medication and nausea medication is helping, no distress noted, warm blanket given for comfort

## 2016-10-22 ENCOUNTER — Telehealth: Payer: Self-pay

## 2016-10-22 DIAGNOSIS — L718 Other rosacea: Secondary | ICD-10-CM | POA: Diagnosis not present

## 2016-10-22 DIAGNOSIS — D485 Neoplasm of uncertain behavior of skin: Secondary | ICD-10-CM | POA: Diagnosis not present

## 2016-10-22 DIAGNOSIS — Z1283 Encounter for screening for malignant neoplasm of skin: Secondary | ICD-10-CM | POA: Diagnosis not present

## 2016-10-22 DIAGNOSIS — L578 Other skin changes due to chronic exposure to nonionizing radiation: Secondary | ICD-10-CM | POA: Diagnosis not present

## 2016-10-22 NOTE — Telephone Encounter (Signed)
Spoke with pt. She was seen 4/18 for DUB sx. Bleeding stopped 5/18 and pt didn't have 6/18 menses. She then had 10/18/16 menses that is heavy and with pain/cramping. She is taking ibup without relief. She denies pelvic pain if not bleeding. She has nausea with pain and quarter to half dollar sized clots with bleeding. She has done OCPs in the past and didn't like them. CT scan 5/18 in ED was suggestive of pelvic congestion although 4/18 CT scan was neg for that. 4 cm leio on both CT scans. Pt to f/u with MD for further eval/mgmt.

## 2016-10-22 NOTE — Telephone Encounter (Signed)
Pt called triage line stating she had seen [facility] in May for bleeding and pain. She did not have a cycle in June but is now having bleeding w/pain and nausea. Please advise CB# [phone] KJ CMA

## 2016-11-06 ENCOUNTER — Ambulatory Visit (INDEPENDENT_AMBULATORY_CARE_PROVIDER_SITE_OTHER): Payer: BLUE CROSS/BLUE SHIELD | Admitting: Obstetrics and Gynecology

## 2016-11-06 ENCOUNTER — Encounter: Payer: Self-pay | Admitting: Obstetrics and Gynecology

## 2016-11-06 VITALS — BP 110/62 | HR 79 | Ht 64.0 in | Wt 159.0 lb

## 2016-11-06 DIAGNOSIS — D251 Intramural leiomyoma of uterus: Secondary | ICD-10-CM | POA: Diagnosis not present

## 2016-11-06 DIAGNOSIS — N939 Abnormal uterine and vaginal bleeding, unspecified: Secondary | ICD-10-CM | POA: Diagnosis not present

## 2016-11-06 DIAGNOSIS — G8929 Other chronic pain: Secondary | ICD-10-CM | POA: Diagnosis not present

## 2016-11-06 DIAGNOSIS — R102 Pelvic and perineal pain: Secondary | ICD-10-CM

## 2016-11-06 MED ORDER — MEDROXYPROGESTERONE ACETATE 10 MG PO TABS: 10.0000 mg | ORAL_TABLET | Freq: Every day | ORAL | 2 refills | Status: DC

## 2016-11-06 NOTE — Progress Notes (Signed)
Gynecology Abnormal Uterine Bleeding Initial Evaluation   Chief Complaint:  Chief Complaint  Patient presents with  . Menorrhagia    skipped June cycle, now having heavy bleeding w/nausea/ cramping    History of Present Illness:    Courtney Pineda is a 46 y.o. [id number] who LMP was Patient's last menstrual period was 10/19/2016 (exact date)., presents today for a problem visit.  She complains of menorrhagia that  began several months ago and its severity is described as severe.  She has regular periods every 30 days and they are associated with moderate menstrual cramping.  She has used the following for attempts at control: tampon and pad. The patient is sexually active. She currently uses vasectomy for contraception.   Previous evaluation: [place] October 2017, and CT scan 08/2016. Prior Diagnosis: based on CT scan 08/2016 degenerating posterior subserosal fibroid. Previous Treatment: None  LMP: Patient's last menstrual period was 10/19/2016 (exact date).  She skipped her period in June and subsequently had prolonged bleeding since mid July.  She has had abdominal pain which prompted CT in May.  Review of Systems: Review of Systems  Constitutional: Negative for chills and fever.  HENT: Negative for congestion.   Respiratory: Negative for cough and shortness of breath.   Cardiovascular: Negative for chest pain and palpitations.  Gastrointestinal: Positive for abdominal pain. Negative for constipation, diarrhea, heartburn, nausea and vomiting.  Genitourinary: Negative for dysuria, frequency and urgency.  Skin: Negative for itching and rash.  Neurological: Negative for dizziness and headaches.  Endo/Heme/Allergies: Negative for polydipsia.  Psychiatric/Behavioral: Negative for depression.    Past Medical History:  Past Medical History:  Diagnosis Date  . Atypical moles    abnormal moles removed from neck.  . Hyperemesis gravidarum 1999  . Kidney stones     Past Surgical History:  Past  Surgical History:  Procedure Laterality Date  . CHOLECYSTECTOMY  06/1994  . LITHOTRIPSY  03/2006  . TONSILECTOMY, ADENOIDECTOMY, BILATERAL MYRINGOTOMY AND TUBES     myringotomy tubes  . TONSILLECTOMY      Obstetric History: [id number]  Family History:  Family History  Problem Relation Age of Onset  . Migraines Mother   . Cancer Paternal Aunt        breast cancer  . Breast cancer Paternal Aunt   . Cancer Maternal Grandmother        brain tumor/breast cancer  . Breast cancer Maternal Grandmother 57  . Heart disease Maternal Grandfather        heart problems    Social History:  Social History   Social History  . Marital status: Married    Spouse name: N/A  . Number of children: N/A  . Years of education: N/A   Occupational History  . Not on file.   Social History Main Topics  . Smoking status: Never Smoker  . Smokeless tobacco: Never Used  . Alcohol use No  . Drug use: No  . Sexual activity: Yes    Birth control/ protection: None   Other Topics Concern  . Not on file   Social History Narrative  . No narrative on file    Allergies:  Allergies  Allergen Reactions  . Sulfonamide Derivatives     REACTION: Lungs close up  . Amoxicillin-Pot Clavulanate     REACTION: rash    Medications: Prior to Admission medications   Medication Sig Start Date End Date Taking? Authorizing Provider  medroxyPROGESTERone (PROVERA) 10 MG tablet Take 1 tablet (10 mg total) by mouth  daily. 11/06/16 12/06/16  Malachy Mood, MD    Physical Exam Vitals:  Vitals:   11/06/16 1351  BP: 110/62  Pulse: 79   Patient's last menstrual period was 10/19/2016 (exact date).  General: NAD HEENT: normocephalic, anicteric Thyroid: no enlargement, no palpable nodules Pulmonary: No increased work of breathing Cardiovascular: RRR, distal pulses 2+ Abdomen: NABS, soft, non-tender, non-distended.  Umbilicus without lesions.  No hepatomegaly, splenomegaly or masses palpable. No evidence of  hernia  Genitourinary:  External: Normal external female genitalia.  Normal urethral meatus, normal  Bartholin's and Skene's glands.    Vagina: Normal vaginal mucosa, no evidence of prolapse.    Cervix: Grossly normal in appearance, no bleeding  Uterus: 15 week size, mobile, normal contour.  No CMT  Adnexa: ovaries non-enlarged, no adnexal masses  Rectal: deferred  Lymphatic: no evidence of inguinal lymphadenopathy Extremities: no edema, erythema, or tenderness Neurologic: Grossly intact Psychiatric: mood appropriate, affect full  Female chaperone present for pelvic portions of the physical exam  Assessment: 46 y.o. [id number] menorrhagia, subserosal degenerating fibroid  Plan: Problem List Items Addressed This Visit    None    Visit Diagnoses    Abnormal uterine bleeding    -  Primary   Relevant Orders   TSH (Completed)   Prolactin (Completed)   CBC (Completed)   US Transvaginal Non-OB   Chronic pelvic pain in female       Relevant Orders   TSH (Completed)   Prolactin (Completed)   CBC (Completed)   US Transvaginal Non-OB   Intramural leiomyoma of uterus       Relevant Orders   CBC (Completed)       1) Discussed management options for abnormal uterine bleeding including expectant, NSAIDs, tranexamic acid (Lysteda), oral progesterone (Provera, norethindrone, megace), Depo Provera, Levonorgestrel containing IUD, endometrial ablation (Novasure) or hysterectomy as definitive surgical management.  Discussed risks and benefits of each method.   Final management decision will hinge on results of patient's work up and whether an underlying etiology for the patients bleeding symptoms can be discerned.  We will conduct a basic work up examining using the PALM-COIEN classification system.  In the meantime the patient opts to trial provera while we await results of her ultrasound and labs.  Printed patient education handouts were given to the patient to review at home.  Bleeding  precautions reviewed.  - 4.5cm degenerating posterior fibroid on CT 08/24/16, not seen on ultrasound in October.  Will re-evaluate to verify no progression in size if degenerating would expect stable in size or slightly smaller  - possible endometrial biopsy at time of ultrasound - CBC, TSH, prolactin today - A total of 15 minutes were spent in face-to-face contact with the patient during this encounter with over half of that time devoted to counseling and coordination of care.

## 2016-11-07 LAB — CBC
HEMATOCRIT: 33.8 % — AB (ref 34.0–46.6)
Hemoglobin: 10.4 g/dL — ABNORMAL LOW (ref 11.1–15.9)
MCH: 23.2 pg — ABNORMAL LOW (ref 26.6–33.0)
MCHC: 30.8 g/dL — AB (ref 31.5–35.7)
MCV: 75 fL — AB (ref 79–97)
Platelets: 419 10*3/uL — ABNORMAL HIGH (ref 150–379)
RBC: 4.49 x10E6/uL (ref 3.77–5.28)
RDW: 16.5 % — AB (ref 12.3–15.4)
WBC: 7 10*3/uL (ref 3.4–10.8)

## 2016-11-07 LAB — TSH: TSH: 1.37 u[IU]/mL (ref 0.450–4.500)

## 2016-11-07 LAB — PROLACTIN: Prolactin: 10.7 ng/mL (ref 4.8–23.3)

## 2016-11-09 ENCOUNTER — Encounter: Payer: Self-pay | Admitting: Obstetrics and Gynecology

## 2016-11-24 NOTE — Progress Notes (Signed)
Gynecology Ultrasound Follow Up  Chief Complaint:  Chief Complaint  Patient presents with  . GYN U/S follow up    Fibroids     History of Present Illness: Patient is a 46 y.o. female who presents today for ultrasound evaluation of 4.5cm degenerating posterior fibroid noted on CT 08/24/16.  Ultrasound demonstrates the following findgins Adnexa: no adnexal masses Uterus: Enlarged with stable 4.5-5cm posterior fundal fibroid with endometrial stripe 28mm Additional: no free fluid  Review of Systems: Review of Systems  Constitutional: Negative for chills, fever and weight loss.  Gastrointestinal: Negative for abdominal pain.    Past Medical History:  Past Medical History:  Diagnosis Date  . Atypical moles    abnormal moles removed from neck.  . Hyperemesis gravidarum 1999  . Kidney stones     Past Surgical History:  Past Surgical History:  Procedure Laterality Date  . CHOLECYSTECTOMY  06/1994  . LITHOTRIPSY  03/2006  . TONSILECTOMY, ADENOIDECTOMY, BILATERAL MYRINGOTOMY AND TUBES     myringotomy tubes  . TONSILLECTOMY     Family History:  Family History  Problem Relation Age of Onset  . Migraines Mother   . Cancer Paternal Aunt        breast cancer  . Breast cancer Paternal Aunt   . Cancer Maternal Grandmother        brain tumor/breast cancer  . Breast cancer Maternal Grandmother 37  . Heart disease Maternal Grandfather        heart problems    Social History:  Social History   Social History  . Marital status: Married    Spouse name: N/A  . Number of children: N/A  . Years of education: N/A   Occupational History  . Not on file.   Social History Main Topics  . Smoking status: Never Smoker  . Smokeless tobacco: Never Used  . Alcohol use No  . Drug use: No  . Sexual activity: Yes    Birth control/ protection: None   Other Topics Concern  . Not on file   Social History Narrative  . No narrative on file    Allergies:  Allergies  Allergen  Reactions  . Sulfonamide Derivatives     REACTION: Lungs close up  . Amoxicillin-Pot Clavulanate     REACTION: rash    Medications: Prior to Admission medications   Medication Sig Start Date End Date Taking? Authorizing Provider  medroxyPROGESTERone (PROVERA) 10 MG tablet Take 1 tablet (10 mg total) by mouth daily. 11/06/16 12/06/16  Malachy Mood, MD    Physical Exam Vitals: There were no vitals taken for this visit.  General: NAD HEENT: normocephalic, anicteric Pulmonary: No increased work of breathing Extremities: no edema, erythema, or tenderness Neurologic: Grossly intact, normal gait Psychiatric: mood appropriate, affect full  ENDOMETRIAL BIOPSY     The indications for endometrial biopsy were reviewed.   Risks of the biopsy including cramping, bleeding, infection, uterine perforation, inadequate specimen and need for additional procedures  were discussed. The patient states she understands and agrees to undergo procedure today. Consent was signed. Time out was performed. Urine HCG was negative. A Graves speculum was placed and the cervix was brought into view.  The cervix was prepped with Betadine. A single-toothed tenaculum was placed on the anterior lip of the cervix for traction. A 3 mm pipelle was introduced through the cervix into the endometrial cavity without difficulty to a depth of 10cm, and a large amount of tissue was obtained, the resulting specime sent to  pathology. The instruments were removed from the patient's vagina. Minimal bleeding from the cervix was noted. The patient tolerated the procedure well. Routine post-procedure instructions were given to the patient.  She will be contacted by phone one results become available.        Assessment: 46 y.o. [id number] AUB-L  Plan: Problem List Items Addressed This Visit    None    Visit Diagnoses    Cervical cancer screening    -  Primary   Relevant Orders   PapIG, HPV, rfx 16/18   Pathology Report   Abnormal  uterine bleeding       Relevant Orders   PapIG, HPV, rfx 16/18   Pathology Report   Intramural leiomyoma of uterus       Relevant Orders   Pathology Report     Patient opts for definitive surgical management via hysterectomy. The risks of surgery were discussed in detail with the patient including but not limited to: bleeding which may require transfusion or reoperation; infection which may require antibiotics; injury to bowel, bladder, ureters or other surrounding organs (With a literature reported rate of urinary tract injury of 1% quoted); need for additional procedures including laparotomy; thromboembolic phenomenon, incisional problems and other postoperative/anesthesia complications.  Patient was also advised that recovery procedure generally involves an overnight stay; and the  expected recovery time after a hysterectomy being in the range of 6-8 weeks.  Likelihood of success in alleviating the patient's symptoms was discussed.  While definitive in regards to issues with menstural bleeding, pelvic pain if present preoperatively may continue and in fact worsen postoperatively.  She is aware that the procedure will render her unable to pursue childbearing in the future.   She was told that she will be contacted by our surgical scheduler regarding the time and date of her surgery; routine preoperative instructions of having nothing to eat or drink after midnight on the day prior to surgery and also coming to the hospital 1.5 hours prior to her time of surgery were also emphasized.  She was told she may be called for a preoperative appointment about a week prior to surgery and will be given further preoperative instructions at that visit.  Routine postoperative instructions will be reviewed with the patient and her family in detail after surgery. Printed patient education handouts about the procedure was given to the patient to review at home. - preoperative pap and endometrial biopsy today  A total  of 15 minutes were spent in face-to-face contact with the patient during this encounter with over half of that time devoted to counseling and coordination of care.

## 2016-11-25 ENCOUNTER — Ambulatory Visit (INDEPENDENT_AMBULATORY_CARE_PROVIDER_SITE_OTHER): Payer: BLUE CROSS/BLUE SHIELD | Admitting: Obstetrics and Gynecology

## 2016-11-25 ENCOUNTER — Encounter: Payer: Self-pay | Admitting: Obstetrics and Gynecology

## 2016-11-25 ENCOUNTER — Ambulatory Visit (INDEPENDENT_AMBULATORY_CARE_PROVIDER_SITE_OTHER): Payer: BLUE CROSS/BLUE SHIELD

## 2016-11-25 VITALS — BP 114/70 | HR 78 | Wt 155.0 lb

## 2016-11-25 DIAGNOSIS — D251 Intramural leiomyoma of uterus: Secondary | ICD-10-CM | POA: Diagnosis not present

## 2016-11-25 DIAGNOSIS — R102 Pelvic and perineal pain: Secondary | ICD-10-CM | POA: Diagnosis not present

## 2016-11-25 DIAGNOSIS — G8929 Other chronic pain: Secondary | ICD-10-CM | POA: Diagnosis not present

## 2016-11-25 DIAGNOSIS — N939 Abnormal uterine and vaginal bleeding, unspecified: Secondary | ICD-10-CM

## 2016-11-25 DIAGNOSIS — Z124 Encounter for screening for malignant neoplasm of cervix: Secondary | ICD-10-CM | POA: Diagnosis not present

## 2016-11-26 ENCOUNTER — Telehealth: Payer: Self-pay

## 2016-11-26 NOTE — Telephone Encounter (Signed)
Wells Guiles calling from [place] to verify the specimen received on 8/15 as an endometrial biopsy.  Looking at [place] notes from 8/15 an endometrial biopsy was done and specimen sent to [facility].

## 2016-11-27 LAB — PAPIG, HPV, RFX 16/18
HPV, high-risk: NEGATIVE
PAP SMEAR COMMENT: 0

## 2016-11-30 ENCOUNTER — Encounter: Payer: Self-pay | Admitting: Obstetrics and Gynecology

## 2016-11-30 LAB — PATHOLOGY

## 2016-12-02 ENCOUNTER — Telehealth: Payer: Self-pay | Admitting: Obstetrics and Gynecology

## 2016-12-02 ENCOUNTER — Encounter: Payer: Self-pay | Admitting: Obstetrics and Gynecology

## 2016-12-02 NOTE — Telephone Encounter (Signed)
-----   Message from Malachy Mood, MD sent at 12/02/2016 11:58 AM EDT ----- Regarding: Surgery (this may be a duplicate) Surgery Date: Patient preference  LOS: observation  Surgery Booking Request Patient Full Name: Courtney Pineda MRN: [id number]  DOB: 1971-04-10  Surgeon: Malachy Mood, MD  Requested Surgery Date and Time: patient preference Primary Diagnosis and Code: Uterine fibroid Secondary Diagnosis and Code: Pelvic pain, abnormal uterine bleeding Surgical Procedure: TLH/BS and cystoscopy L&D Notification:no Admission Status: observation Length of Surgery: 2hrs Special Case Needs: none H&P:  (date) Phone Interview or Office Pre-Admit: office Interpreter: none Language: english Medical Clearance: none Special Scheduling Instructions: none

## 2016-12-02 NOTE — Telephone Encounter (Signed)
Lmtrc

## 2016-12-03 NOTE — Telephone Encounter (Signed)
Patient is aware of H&P on 01/11/17 @ 1:30pm at [facility] w/ Dr. Georgianne Fick, [place] afterwards, and OR on 01/14/17. Patient is aware she may receive calls from the [place] [facility]. Ext given.

## 2016-12-08 ENCOUNTER — Telehealth: Payer: Self-pay

## 2016-12-08 NOTE — Telephone Encounter (Signed)
FMLA/DISABILITY form for [employer] filled out and given to [place] for processing.

## 2017-01-07 ENCOUNTER — Other Ambulatory Visit: Payer: BLUE CROSS/BLUE SHIELD

## 2017-01-11 ENCOUNTER — Encounter: Payer: Self-pay | Admitting: Obstetrics and Gynecology

## 2017-01-11 ENCOUNTER — Encounter
Admission: RE | Admit: 2017-01-11 | Discharge: 2017-01-11 | Disposition: A | Discharge status: Home / Self Care | Payer: BLUE CROSS/BLUE SHIELD | Source: Ambulatory Visit | Attending: Obstetrics and Gynecology | Admitting: Obstetrics and Gynecology

## 2017-01-11 ENCOUNTER — Ambulatory Visit (INDEPENDENT_AMBULATORY_CARE_PROVIDER_SITE_OTHER): Payer: BLUE CROSS/BLUE SHIELD | Admitting: Obstetrics and Gynecology

## 2017-01-11 VITALS — BP 102/68 | HR 72 | Ht 64.0 in | Wt 158.0 lb

## 2017-01-11 DIAGNOSIS — D251 Intramural leiomyoma of uterus: Secondary | ICD-10-CM

## 2017-01-11 DIAGNOSIS — N72 Inflammatory disease of cervix uteri: Secondary | ICD-10-CM | POA: Diagnosis not present

## 2017-01-11 DIAGNOSIS — N800 Endometriosis of uterus: Secondary | ICD-10-CM | POA: Diagnosis not present

## 2017-01-11 DIAGNOSIS — Z880 Allergy status to penicillin: Secondary | ICD-10-CM | POA: Diagnosis not present

## 2017-01-11 DIAGNOSIS — R102 Pelvic and perineal pain: Secondary | ICD-10-CM

## 2017-01-11 DIAGNOSIS — N946 Dysmenorrhea, unspecified: Secondary | ICD-10-CM | POA: Diagnosis not present

## 2017-01-11 DIAGNOSIS — N920 Excessive and frequent menstruation with regular cycle: Secondary | ICD-10-CM | POA: Diagnosis not present

## 2017-01-11 DIAGNOSIS — Z882 Allergy status to sulfonamides status: Secondary | ICD-10-CM | POA: Diagnosis not present

## 2017-01-11 HISTORY — DX: Anemia, unspecified: D64.9

## 2017-01-11 LAB — CBC
HCT: 30.4 % — ABNORMAL LOW (ref 35.0–47.0)
Hemoglobin: 9.4 g/dL — ABNORMAL LOW (ref 12.0–16.0)
MCH: 21.3 pg — AB (ref 26.0–34.0)
MCHC: 30.9 g/dL — ABNORMAL LOW (ref 32.0–36.0)
MCV: 69 fL — AB (ref 80.0–100.0)
PLATELETS: 361 10*3/uL (ref 150–440)
RBC: 4.4 MIL/uL (ref 3.80–5.20)
RDW: 17.8 % — AB (ref 11.5–14.5)
WBC: 7.8 10*3/uL (ref 3.6–11.0)

## 2017-01-11 LAB — BASIC METABOLIC PANEL
Anion gap: 7 (ref 5–15)
BUN: 11 mg/dL (ref 6–20)
CHLORIDE: 108 mmol/L (ref 101–111)
CO2: 24 mmol/L (ref 22–32)
CREATININE: 0.73 mg/dL (ref 0.44–1.00)
Calcium: 8.8 mg/dL — ABNORMAL LOW (ref 8.9–10.3)
GFR calc Af Amer: >60 mL/min (ref >60)
GFR calc non Af Amer: >60 mL/min (ref >60)
Glucose, Bld: 93 mg/dL (ref 65–99)
Potassium: 3.8 mmol/L (ref 3.5–5.1)
Sodium: 139 mmol/L (ref 135–145)

## 2017-01-11 LAB — TYPE AND SCREEN
ABO/RH(D): B NEG
ANTIBODY SCREEN: NEGATIVE

## 2017-01-11 NOTE — Progress Notes (Signed)
Obstetrics & Gynecology Surgery H&P    Chief Complaint: Scheduled Surgery   History of Present Illness: Patient is a 46 y.o. [id number] presenting for scheduled TLH, BS, cystoscopy, for the treatment or further evaluation of menorrhagia, dysmenorrhea, pelvic pain secondary to uterine fibroid with degenerative changes.   Preoperative Pap: 8/15/18Results: NIL HPV negative  Preoperative Endometrial biopsy: 11/25/2016 Findings: proliferative endometrium, no hyperplasia or carcinoma Preoperative Ultrasound: 11/25/2016 Findings: 5x3cm posterior myoma, intramural, degenerative changes noted on CT scan   Review of Systems:10 point review of systems  Past Medical History:  Past Medical History:  Diagnosis Date  . Atypical moles    abnormal moles removed from neck.  . Hyperemesis gravidarum 1999  . Kidney stones     Past Surgical History:  Past Surgical History:  Procedure Laterality Date  . CHOLECYSTECTOMY  06/1994  . LITHOTRIPSY  03/2006  . TONSILECTOMY, ADENOIDECTOMY, BILATERAL MYRINGOTOMY AND TUBES     myringotomy tubes  . TONSILLECTOMY      Family History:  Family History  Problem Relation Age of Onset  . Migraines Mother   . Cancer Paternal Aunt        breast cancer  . Breast cancer Paternal Aunt   . Cancer Maternal Grandmother        brain tumor/breast cancer  . Breast cancer Maternal Grandmother 20  . Heart disease Maternal Grandfather        heart problems    Social History:  Social History   Social History  . Marital status: Married    Spouse name: N/A  . Number of children: N/A  . Years of education: N/A   Occupational History  . Not on file.   Social History Main Topics  . Smoking status: Never Smoker  . Smokeless tobacco: Never Used  . Alcohol use No  . Drug use: No  . Sexual activity: Yes    Birth control/ protection: None   Other Topics Concern  . Not on file   Social History Narrative  . No narrative on file    Allergies:  Allergies    Allergen Reactions  . Bee Venom Anaphylaxis  . Sulfonamide Derivatives Anaphylaxis    Lungs close up  . Penicillins Rash    Has patient had a PCN reaction causing immediate rash, facial/tongue/throat swelling, SOB or lightheadedness with hypotension: No Has patient had a PCN reaction causing severe rash involving mucus membranes or skin necrosis: No Has patient had a PCN reaction that required hospitalization: No Has patient had a PCN reaction occurring within the last 10 years: No If all of the above answers are "NO", then may proceed with Cephalosporin use.     Medications: Prior to Admission medications   Not on File    Physical Exam Vitals: Blood pressure 102/68, pulse 72, height 5\' 4"  (1.626 m), weight 158 lb (71.7 kg), last menstrual period 01/08/2017. General: NAD HEENT: normocephalic, anicteric Pulmonary: CTAB, no increased work of breathing Cardiovascular: RRR, distal pulses 2+ Abdomen: soft, non-tender, non-distended Extremities: no edema, erythema, or tenderness Neurologic: Grossly intact Psychiatric: mood appropriate, affect full  Imaging No results found.  Assessment: 46 y.o. [id number] presenting for scheduled TLH, BS, cystoscopy  Plan: 1) Patient opts for definitive surgical management via hysterectomy. The risks of surgery were discussed in detail with the patient including but not limited to: bleeding which may require transfusion or reoperation; infection which may require antibiotics; injury to bowel, bladder, ureters or other surrounding organs (With a literature reported rate of urinary tract  injury of 1% quoted); need for additional procedures including laparotomy; thromboembolic phenomenon, incisional problems and other postoperative/anesthesia complications.  Patient was also advised that recovery procedure generally involves an overnight stay; and the  expected recovery time after a hysterectomy being in the range of 6-8 weeks.  Likelihood of success in  alleviating the patient's symptoms was discussed.  While definitive in regards to issues with menstural bleeding, pelvic pain if present preoperatively may continue and in fact worsen postoperatively.  She is aware that the procedure will render her unable to pursue childbearing in the future.   She was told that she will be contacted by our surgical scheduler regarding the time and date of her surgery; routine preoperative instructions of having nothing to eat or drink after midnight on the day prior to surgery and also coming to the hospital 1.5 hours prior to her time of surgery were also emphasized.  She was told she may be called for a preoperative appointment about a week prior to surgery and will be given further preoperative instructions at that visit.  Routine postoperative instructions will be reviewed with the patient and her family in detail after surgery. Printed patient education handouts about the procedure was given to the patient to review at home.   2) Routine postoperative instructions were reviewed with the patient and her family in detail today including the expected length of recovery and likely postoperative course.  The patient concurred with the proposed plan, giving informed written consent for the surgery today.  Patient instructed on the importance of being NPO after midnight prior to her procedure.  If warranted preoperative prophylactic antibiotics and SCDs ordered on call to the OR to meet SCIP guidelines and adhere to recommendation laid forth in [place] Number 104 May 2009  "Antibiotic Prophylaxis for Gynecologic Procedures".

## 2017-01-11 NOTE — Patient Instructions (Signed)
Your procedure is scheduled on: 01/14/17 Thurs Report to Same Day Surgery 2nd floor medical mall [facility] Entrance-take elevator on left to 2nd floor.  Check in with surgery information desk.) To find out your arrival time please call [phone] between 1PM - 3PM on 01/13/17 Wed Remember: Instructions that are not followed completely may result in serious medical risk, up to and including death, or upon the discretion of your surgeon and anesthesiologist your surgery may need to be rescheduled.    _x___ 1. Do not eat food after midnight the night before your procedure. You may drink clear liquids up to 2 hours before you are scheduled to arrive at the hospital for your procedure.  Do not drink clear liquids within 2 hours of your scheduled arrival to the hospital.  Clear liquids include  --Water or Apple juice without pulp  --Clear carbohydrate beverage such as ClearFast or Gatorade  --Black Coffee or Clear Tea (No milk, no creamers, do not add anything to                  the coffee or Tea Type 1 and type 2 diabetics should only drink water.  No gum chewing or hard candies.     __x__ 2. No Alcohol for 24 hours before or after surgery.   __x__3. No Smoking for 24 prior to surgery.   ____  4. Bring all medications with you on the day of surgery if instructed.    __x__ 5. Notify your doctor if there is any change in your medical condition     (cold, fever, infections).     Do not wear jewelry, make-up, hairpins, clips or nail polish.  Do not wear lotions, powders, or perfumes. You may wear deodorant.  Do not shave 48 hours prior to surgery. Men may shave face and neck.  Do not bring valuables to the hospital.    [facility] is not responsible for any belongings or valuables.               Contacts, dentures or bridgework may not be worn into surgery.  Leave your suitcase in the car. After surgery it may be brought to your room.  For patients admitted to the hospital, discharge  time is determined by your                       treatment team.   Patients discharged the day of surgery will not be allowed to drive home.  You will need someone to drive you home and stay with you the night of your procedure.    Please read over the following fact sheets that you were given:   [facility] Preparing for Surgery and or MRSA Information   _x___ Take anti-hypertensive listed below, cardiac, seizure, asthma,     anti-reflux and psychiatric medicines. These include:  1. None  2.  3.  4.  5.  6.  ____Fleets enema or Magnesium Citrate as directed.   _x___ Use CHG Soap or sage wipes as directed on instruction sheet   ____ Use inhalers on the day of surgery and bring to hospital day of surgery  ____ Stop Metformin and Janumet 2 days prior to surgery.    ____ Take 1/2 of usual insulin dose the night before surgery and none on the morning     surgery.   _x___ Follow recommendations from Cardiologist, Pulmonologist or PCP regarding  stopping Aspirin, Coumadin, Plavix ,Eliquis, Effient, or Pradaxa, and Pletal.  X____Stop Anti-inflammatories such as Advil, Aleve, Ibuprofen, Motrin, Naproxen, Naprosyn, Goodies powders or aspirin products. OK to take Tylenol and                          Celebrex.   _x___ Stop supplements until after surgery.  But may continue Vitamin D, Vitamin B,       and multivitamin.   ____ Bring C-Pap to the hospital.

## 2017-01-13 MED ORDER — CLINDAMYCIN PHOSPHATE 900 MG/50ML IV SOLN
900.0000 mg | INTRAVENOUS | Status: AC
  Administered 2017-01-14: 900 mg via INTRAVENOUS

## 2017-01-13 MED ORDER — GENTAMICIN SULFATE 40 MG/ML IJ SOLN
INTRAMUSCULAR | Status: DC
  Filled 2017-01-13: qty 9

## 2017-01-13 MED ORDER — GENTAMICIN SULFATE 40 MG/ML IJ SOLN
360.0000 mg | INTRAMUSCULAR | Status: AC
  Administered 2017-01-14: 360 mg via INTRAVENOUS
  Filled 2017-01-13: qty 9

## 2017-01-14 ENCOUNTER — Observation Stay
Admission: RE | Admit: 2017-01-14 | Discharge: 2017-01-15 | Disposition: A | Discharge status: Home / Self Care | Payer: BLUE CROSS/BLUE SHIELD | Source: Ambulatory Visit | Attending: Obstetrics and Gynecology | Admitting: Obstetrics and Gynecology

## 2017-01-14 ENCOUNTER — Encounter: Payer: Self-pay | Admitting: *Deleted

## 2017-01-14 ENCOUNTER — Ambulatory Visit: Payer: BLUE CROSS/BLUE SHIELD | Admitting: Registered Nurse

## 2017-01-14 ENCOUNTER — Encounter
Admission: RE | Disposition: A | Discharge status: Home / Self Care | Payer: Self-pay | Source: Ambulatory Visit | Attending: Obstetrics and Gynecology

## 2017-01-14 DIAGNOSIS — N800 Endometriosis of uterus: Principal | ICD-10-CM | POA: Insufficient documentation

## 2017-01-14 DIAGNOSIS — N939 Abnormal uterine and vaginal bleeding, unspecified: Secondary | ICD-10-CM | POA: Diagnosis not present

## 2017-01-14 DIAGNOSIS — N920 Excessive and frequent menstruation with regular cycle: Secondary | ICD-10-CM | POA: Diagnosis not present

## 2017-01-14 DIAGNOSIS — N946 Dysmenorrhea, unspecified: Secondary | ICD-10-CM | POA: Diagnosis not present

## 2017-01-14 DIAGNOSIS — Z880 Allergy status to penicillin: Secondary | ICD-10-CM | POA: Insufficient documentation

## 2017-01-14 DIAGNOSIS — Z90710 Acquired absence of both cervix and uterus: Secondary | ICD-10-CM

## 2017-01-14 DIAGNOSIS — Z882 Allergy status to sulfonamides status: Secondary | ICD-10-CM | POA: Diagnosis not present

## 2017-01-14 DIAGNOSIS — D251 Intramural leiomyoma of uterus: Secondary | ICD-10-CM | POA: Diagnosis not present

## 2017-01-14 DIAGNOSIS — D259 Leiomyoma of uterus, unspecified: Secondary | ICD-10-CM | POA: Diagnosis not present

## 2017-01-14 DIAGNOSIS — N72 Inflammatory disease of cervix uteri: Secondary | ICD-10-CM | POA: Diagnosis not present

## 2017-01-14 DIAGNOSIS — R102 Pelvic and perineal pain: Secondary | ICD-10-CM | POA: Diagnosis not present

## 2017-01-14 HISTORY — PX: TOTAL LAPAROSCOPIC HYSTERECTOMY WITH SALPINGECTOMY: SHX6742

## 2017-01-14 HISTORY — PX: CYSTOSCOPY: SHX5120

## 2017-01-14 LAB — ABO/RH: ABO/RH(D): B NEG

## 2017-01-14 LAB — POCT PREGNANCY, URINE: Preg Test, Ur: NEGATIVE

## 2017-01-14 SURGERY — HYSTERECTOMY, TOTAL, LAPAROSCOPIC, WITH SALPINGECTOMY
Anesthesia: Choice | Site: Bladder | Wound class: Clean Contaminated

## 2017-01-14 MED ORDER — KETOROLAC TROMETHAMINE 30 MG/ML IJ SOLN: 30.0000 mg | Freq: Four times a day (QID) | INTRAMUSCULAR | Status: DC

## 2017-01-14 MED ORDER — OXYCODONE-ACETAMINOPHEN 5-325 MG PO TABS
1.0000 | ORAL_TABLET | ORAL | Status: DC | PRN
  Administered 2017-01-14 – 2017-01-15 (×2): 2 via ORAL
  Administered 2017-01-15: 1 via ORAL
  Administered 2017-01-15: 2 via ORAL
  Filled 2017-01-14 (×4): qty 2

## 2017-01-14 MED ORDER — ONDANSETRON HCL 4 MG/2ML IJ SOLN
4.0000 mg | Freq: Four times a day (QID) | INTRAMUSCULAR | Status: DC | PRN
  Administered 2017-01-14 – 2017-01-15 (×2): 4 mg via INTRAVENOUS
  Filled 2017-01-14 (×2): qty 2

## 2017-01-14 MED ORDER — ROCURONIUM BROMIDE 50 MG/5ML IV SOLN
INTRAVENOUS | Status: AC
  Filled 2017-01-14: qty 1

## 2017-01-14 MED ORDER — ONDANSETRON HCL 4 MG/2ML IJ SOLN
INTRAMUSCULAR | Status: AC
  Filled 2017-01-14: qty 2

## 2017-01-14 MED ORDER — FENTANYL CITRATE (PF) 100 MCG/2ML IJ SOLN
INTRAMUSCULAR | Status: DC | PRN
  Administered 2017-01-14: 100 ug via INTRAVENOUS

## 2017-01-14 MED ORDER — ROCURONIUM BROMIDE 100 MG/10ML IV SOLN
INTRAVENOUS | Status: DC | PRN
  Administered 2017-01-14 (×2): 5 mg via INTRAVENOUS
  Administered 2017-01-14: 25 mg via INTRAVENOUS

## 2017-01-14 MED ORDER — BUPIVACAINE HCL 0.5 % IJ SOLN
INTRAMUSCULAR | Status: DC | PRN
  Administered 2017-01-14: 15 mL

## 2017-01-14 MED ORDER — DEXTROSE-NACL 5-0.45 % IV SOLN
INTRAVENOUS | Status: DC
  Administered 2017-01-14 – 2017-01-15 (×3): via INTRAVENOUS

## 2017-01-14 MED ORDER — ONDANSETRON HCL 4 MG/2ML IJ SOLN
4.0000 mg | Freq: Once | INTRAMUSCULAR | Status: AC | PRN
  Administered 2017-01-14: 4 mg via INTRAVENOUS

## 2017-01-14 MED ORDER — KETOROLAC TROMETHAMINE 30 MG/ML IJ SOLN
30.0000 mg | Freq: Four times a day (QID) | INTRAMUSCULAR | Status: DC
  Administered 2017-01-14 – 2017-01-15 (×4): 30 mg via INTRAVENOUS
  Filled 2017-01-14 (×3): qty 1

## 2017-01-14 MED ORDER — FENTANYL CITRATE (PF) 100 MCG/2ML IJ SOLN
INTRAMUSCULAR | Status: AC
  Administered 2017-01-14: 25 ug via INTRAVENOUS
  Filled 2017-01-14: qty 2

## 2017-01-14 MED ORDER — DEXAMETHASONE SODIUM PHOSPHATE 10 MG/ML IJ SOLN
INTRAMUSCULAR | Status: AC
  Filled 2017-01-14: qty 1

## 2017-01-14 MED ORDER — MIDAZOLAM HCL 2 MG/2ML IJ SOLN
INTRAMUSCULAR | Status: DC | PRN
  Administered 2017-01-14: 2 mg via INTRAVENOUS

## 2017-01-14 MED ORDER — DIPHENHYDRAMINE HCL 25 MG PO CAPS: 50.0000 mg | ORAL_CAPSULE | Freq: Four times a day (QID) | ORAL | Status: DC | PRN

## 2017-01-14 MED ORDER — EPHEDRINE SULFATE 50 MG/ML IJ SOLN
INTRAMUSCULAR | Status: DC | PRN
  Administered 2017-01-14: 5 mg via INTRAVENOUS

## 2017-01-14 MED ORDER — CLINDAMYCIN PHOSPHATE 900 MG/50ML IV SOLN
INTRAVENOUS | Status: AC
  Filled 2017-01-14: qty 50

## 2017-01-14 MED ORDER — MENTHOL 3 MG MT LOZG: 1.0000 | LOZENGE | OROMUCOSAL | Status: DC | PRN

## 2017-01-14 MED ORDER — FENTANYL CITRATE (PF) 100 MCG/2ML IJ SOLN
INTRAMUSCULAR | Status: AC
  Filled 2017-01-14: qty 2

## 2017-01-14 MED ORDER — LIDOCAINE HCL (CARDIAC) 20 MG/ML IV SOLN
INTRAVENOUS | Status: DC | PRN
  Administered 2017-01-14: 60 mg via INTRAVENOUS

## 2017-01-14 MED ORDER — MIDAZOLAM HCL 2 MG/2ML IJ SOLN
INTRAMUSCULAR | Status: AC
  Filled 2017-01-14: qty 2

## 2017-01-14 MED ORDER — ONDANSETRON HCL 4 MG/2ML IJ SOLN
INTRAMUSCULAR | Status: DC | PRN
  Administered 2017-01-14: 4 mg via INTRAVENOUS

## 2017-01-14 MED ORDER — ACETAMINOPHEN 10 MG/ML IV SOLN
INTRAVENOUS | Status: DC | PRN
  Administered 2017-01-14: 1000 mg via INTRAVENOUS

## 2017-01-14 MED ORDER — PROPOFOL 10 MG/ML IV BOLUS
INTRAVENOUS | Status: DC | PRN
  Administered 2017-01-14: 150 mg via INTRAVENOUS

## 2017-01-14 MED ORDER — SUCCINYLCHOLINE CHLORIDE 20 MG/ML IJ SOLN
INTRAMUSCULAR | Status: DC | PRN
  Administered 2017-01-14: 100 mg via INTRAVENOUS

## 2017-01-14 MED ORDER — KETOROLAC TROMETHAMINE 30 MG/ML IJ SOLN
INTRAMUSCULAR | Status: AC
  Administered 2017-01-14: 30 mg via INTRAVENOUS
  Filled 2017-01-14: qty 1

## 2017-01-14 MED ORDER — DEXAMETHASONE SODIUM PHOSPHATE 10 MG/ML IJ SOLN
INTRAMUSCULAR | Status: DC | PRN
  Administered 2017-01-14: 5 mg via INTRAVENOUS

## 2017-01-14 MED ORDER — FAMOTIDINE 20 MG PO TABS
ORAL_TABLET | ORAL | Status: AC
  Filled 2017-01-14: qty 1

## 2017-01-14 MED ORDER — LACTATED RINGERS IV SOLN
INTRAVENOUS | Status: DC
  Administered 2017-01-14: 09:00:00 via INTRAVENOUS

## 2017-01-14 MED ORDER — BUPIVACAINE HCL (PF) 0.5 % IJ SOLN
INTRAMUSCULAR | Status: AC
  Filled 2017-01-14: qty 30

## 2017-01-14 MED ORDER — SUGAMMADEX SODIUM 200 MG/2ML IV SOLN
INTRAVENOUS | Status: AC
  Filled 2017-01-14: qty 2

## 2017-01-14 MED ORDER — FENTANYL CITRATE (PF) 100 MCG/2ML IJ SOLN
25.0000 ug | INTRAMUSCULAR | Status: AC | PRN
  Administered 2017-01-14 (×6): 25 ug via INTRAVENOUS

## 2017-01-14 MED ORDER — FAMOTIDINE 20 MG PO TABS
20.0000 mg | ORAL_TABLET | Freq: Once | ORAL | Status: AC
  Administered 2017-01-14: 20 mg via ORAL

## 2017-01-14 MED ORDER — MORPHINE SULFATE (PF) 2 MG/ML IV SOLN
1.0000 mg | INTRAVENOUS | Status: DC | PRN
  Administered 2017-01-14: 1 mg via INTRAVENOUS
  Administered 2017-01-14: 2 mg via INTRAVENOUS
  Filled 2017-01-14 (×2): qty 1

## 2017-01-14 MED ORDER — SUGAMMADEX SODIUM 500 MG/5ML IV SOLN
INTRAVENOUS | Status: DC | PRN
  Administered 2017-01-14: 200 mg via INTRAVENOUS

## 2017-01-14 SURGICAL SUPPLY — 52 items
ADH SKN CLS APL DERMABOND .7 (GAUZE/BANDAGES/DRESSINGS) ×2
BAG URO DRAIN 2000ML W/SPOUT (MISCELLANEOUS) ×3 IMPLANT
BLADE SURG SZ11 CARB STEEL (BLADE) ×3 IMPLANT
CANISTER SUCT 1200ML W/VALVE (MISCELLANEOUS) ×3 IMPLANT
CATH FOLEY 2WAY  5CC 16FR (CATHETERS) ×1
CATH FOLEY 2WAY 5CC 16FR (CATHETERS) ×2
CATH ROBINSON RED A/P 16FR (CATHETERS) ×3 IMPLANT
CATH URTH 16FR FL 2W BLN LF (CATHETERS) ×2 IMPLANT
CHLORAPREP W/TINT 26ML (MISCELLANEOUS) ×3 IMPLANT
COUNTER NEEDLE 20/40 LG (NEEDLE) ×3 IMPLANT
DEFOGGER SCOPE WARMER CLEARIFY (MISCELLANEOUS) ×3 IMPLANT
DERMABOND ADVANCED (GAUZE/BANDAGES/DRESSINGS) ×1
DERMABOND ADVANCED .7 DNX12 (GAUZE/BANDAGES/DRESSINGS) ×2 IMPLANT
DRAPE UNDER BUTTOCK W/FLU (DRAPES) ×3 IMPLANT
GLOVE BIO SURGEON STRL SZ7 (GLOVE) ×12 IMPLANT
GLOVE INDICATOR 7.5 STRL GRN (GLOVE) ×3 IMPLANT
GOWN STRL REUS W/ TWL LRG LVL3 (GOWN DISPOSABLE) ×4 IMPLANT
GOWN STRL REUS W/ TWL XL LVL3 (GOWN DISPOSABLE) ×2 IMPLANT
GOWN STRL REUS W/TWL LRG LVL3 (GOWN DISPOSABLE) ×6
GOWN STRL REUS W/TWL XL LVL3 (GOWN DISPOSABLE) ×3
IRRIGATION STRYKERFLOW (MISCELLANEOUS) ×2 IMPLANT
IRRIGATOR STRYKERFLOW (MISCELLANEOUS) ×3
IV LACTATED RINGERS 1000ML (IV SOLUTION) ×3 IMPLANT
IV NS 1000ML (IV SOLUTION) ×3
IV NS 1000ML BAXH (IV SOLUTION) ×2 IMPLANT
KIT PINK PAD W/HEAD ARE REST (MISCELLANEOUS) ×3
KIT PINK PAD W/HEAD ARM REST (MISCELLANEOUS) ×2 IMPLANT
LABEL OR SOLS (LABEL) ×3 IMPLANT
MANIPULATOR VCARE LG CRV RETR (MISCELLANEOUS) ×1 IMPLANT
MANIPULATOR VCARE SML CRV RETR (MISCELLANEOUS) IMPLANT
MANIPULATOR VCARE STD CRV RETR (MISCELLANEOUS) IMPLANT
NS IRRIG 1000ML POUR BTL (IV SOLUTION) ×3 IMPLANT
NS IRRIG 500ML POUR BTL (IV SOLUTION) ×3 IMPLANT
OCCLUDER COLPOPNEUMO (BALLOONS) IMPLANT
PACK GYN LAPAROSCOPIC (MISCELLANEOUS) ×3 IMPLANT
PAD OB MATERNITY 4.3X12.25 (PERSONAL CARE ITEMS) ×3 IMPLANT
PAD PREP 24X41 OB/GYN DISP (PERSONAL CARE ITEMS) ×3 IMPLANT
SET CYSTO W/LG BORE CLAMP LF (SET/KITS/TRAYS/PACK) ×3 IMPLANT
SHEARS HARMONIC ACE PLUS 36CM (ENDOMECHANICALS) ×3 IMPLANT
SLEEVE ENDOPATH XCEL 5M (ENDOMECHANICALS) ×6 IMPLANT
SPONGE LAP 18X18 5 PK (GAUZE/BANDAGES/DRESSINGS) ×3 IMPLANT
SPONGE XRAY 4X4 16PLY STRL (MISCELLANEOUS) ×3 IMPLANT
STRAP SAFETY BODY (MISCELLANEOUS) ×3 IMPLANT
SURGILUBE 2OZ TUBE FLIPTOP (MISCELLANEOUS) ×3 IMPLANT
SUT VIC AB 0 CT1 27 (SUTURE) ×3
SUT VIC AB 0 CT1 27XCR 8 STRN (SUTURE) ×2 IMPLANT
SUT VIC AB 2-0 UR6 27 (SUTURE) ×3 IMPLANT
SUT VIC AB 4-0 FS2 27 (SUTURE) ×6 IMPLANT
SYR 50ML LL SCALE MARK (SYRINGE) ×3 IMPLANT
SYRINGE 10CC LL (SYRINGE) ×3 IMPLANT
TROCAR XCEL NON-BLD 5MMX100MML (ENDOMECHANICALS) ×3 IMPLANT
TUBING INSUF HEATED (TUBING) ×3 IMPLANT

## 2017-01-14 NOTE — H&P (Signed)
Date of Initial H&P:01/11/2017  History reviewed, patient examined, no change in status, stable for surgery.

## 2017-01-14 NOTE — Anesthesia Post-op Follow-up Note (Signed)
Anesthesia QCDR form completed.        

## 2017-01-14 NOTE — Anesthesia Procedure Notes (Signed)
Procedure Name: Intubation Date/Time: 01/14/2017 10:13 AM Performed by: Hedda Slade Pre-anesthesia Checklist: Patient identified, Patient being monitored, Timeout performed, Emergency Drugs available and Suction available Patient Re-evaluated:Patient Re-evaluated prior to induction Oxygen Delivery Method: Circle system utilized Preoxygenation: Pre-oxygenation with 100% oxygen Induction Type: IV induction Ventilation: Mask ventilation without difficulty Laryngoscope Size: Mac and 3 Grade View: Grade II Tube type: Oral Tube size: 7.0 mm Number of attempts: 1 Airway Equipment and Method: Stylet Placement Confirmation: ETT inserted through vocal cords under direct vision,  positive ETCO2 and breath sounds checked- equal and bilateral Secured at: 21 cm Tube secured with: Tape Dental Injury: Teeth and Oropharynx as per pre-operative assessment

## 2017-01-14 NOTE — Progress Notes (Signed)
Obstetric and Gynecology  Subjective  Doing well, some bladder discomfort and per nursing foley very positional as far as drainage.  Urine has been clear, just emptied 316mL.  Objective   Vitals:   01/14/17 1434 01/14/17 1529  BP: 105/68 108/60  Pulse: 70 70  Resp: 16 16  Temp: 97.8 F (36.6 C) 97.9 F (36.6 C)  SpO2: 96% 98%     Intake/Output Summary (Last 24 hours) at 01/14/17 1707 Last data filed at 01/14/17 1545  Gross per 24 hour  Intake              150 ml  Output              300 ml  Net             -150 ml    General: NAD Abdomen: soft, appropriately tender Extremities: SCD's  Labs: Results for orders placed or performed during the hospital encounter of 01/14/17 (from the past 24 hour(s))  Pregnancy, urine POC     Status: None   Collection Time: 01/14/17  8:46 AM  Result Value Ref Range   Preg Test, Ur NEGATIVE NEGATIVE  ABO/Rh     Status: None   Collection Time: 01/14/17  9:40 AM  Result Value Ref Range   ABO/RH(D) B NEG     Cultures: Results for orders placed or performed in visit on 08/21/16  Chlamydia/Gonococcus/Trichomonas, NAA     Status: None   Collection Time: 08/21/16 10:24 AM  Result Value Ref Range Status   Chlamydia by NAA Negative Negative Final   Gonococcus by NAA Negative Negative Final   Trich vag by NAA Negative Negative Final    Imaging:  Assessment   46 y.o. [id number] POD0 TLH, BS, cystoscopy uterine fibroids   Plan   - replace foley, discussed alternative is to d/c foley and see if patient able to urinate  - advance diet as tolerated - Discussed surgical findings, including evidence of endometriosis

## 2017-01-14 NOTE — Anesthesia Preprocedure Evaluation (Addendum)
Anesthesia Evaluation  Patient identified by MRN, date of birth, ID band Patient awake    Reviewed: Allergy & Precautions, NPO status , Patient's Chart, lab work & pertinent test results  Airway Mallampati: III  TM Distance: <3 FB     Dental  (+) Teeth Intact   Pulmonary neg pulmonary ROS,    Pulmonary exam normal        Cardiovascular negative cardio ROS Normal cardiovascular exam     Neuro/Psych  Headaches, negative psych ROS   GI/Hepatic negative GI ROS, Neg liver ROS,   Endo/Other  negative endocrine ROS  Renal/GU stones  Female GU complaint     Musculoskeletal negative musculoskeletal ROS (+)   Abdominal Normal abdominal exam  (+)   Peds negative pediatric ROS (+)  Hematology  (+) anemia ,   Anesthesia Other Findings   Reproductive/Obstetrics                            Anesthesia Physical Anesthesia Plan  ASA: II  Anesthesia Plan:    Post-op Pain Management:    Induction: Intravenous  PONV Risk Score and Plan:   Airway Management Planned: Oral ETT  Additional Equipment:   Intra-op Plan:   Post-operative Plan: Extubation in OR  Informed Consent: I have reviewed the patients History and Physical, chart, labs and discussed the procedure including the risks, benefits and alternatives for the proposed anesthesia with the patient or authorized representative who has indicated his/her understanding and acceptance.   Dental advisory given  Plan Discussed with: CRNA and Surgeon  Anesthesia Plan Comments:         Anesthesia Quick Evaluation

## 2017-01-14 NOTE — Progress Notes (Signed)
Pt verbalized discomfort of feeling the urge to void. RN changed patient positioning and positioning of the catheter, urine able to drain, however pt still c/o discomfort. Dr. Georgianne Fick at bedside who stated to just replace the foley catheter and then remove in the morning. Alecia Lemming RN

## 2017-01-14 NOTE — Op Note (Signed)
Preoperative Diagnosis: 1) 46 y.o. with chronic pelvic pain 2) Menorrhagia 3) Dysmenorrhea 4) Degenerating 4cm fibroid  Postoperative Diagnosis: 1) 46 y.o. with chronic pelvic pain 2) Menorrhagia 3) Dysmenorrhea 4) Degenerating 4cm fibroid 5) Endometriosis  Operation Performed: Total laparoscopic hysterectomy, bilateral salpingectomy, and cystoscopy  Indication: Chronic pelvic pain and menorrhagia, dysmenorrhea with 4cm degenerating fundal myoma on imaging  Surgeon: Malachy Mood, MD  Assistant: Prentice Docker, MD  Anesthesia: Gala Murdoch  Preoperative Antibiotics: Clindamycin and gentamycin  Estimated Blood Loss: 60mL  IV Fluids: 1L  Urine Output:: 68mL  Drains or Tubes: Foley to gravity drainage  Implants: none  Specimens Removed: Uterus, cervix, and bilateral fallopian tubes  Complications: none  Intraoperative Findings: Enlarged uterus with 4cm right fundal fibroid, some Daddario peritoneal windows as well as evidence of endometriosis involving the uterine fundus with filmy adhesive disease of omentum.  Normal ureters, appendix, and liver.  Cystoscopy showing bilateral efflux of urine from both UO's and intact bladder dome.  Patient Condition: stable  Procedure in Detail:  Patient was taken to the operating room where she was administered general anesthesia.  She was positioned in the dorsal lithotomy position utilizing Allen stirups, prepped and draped in the usual sterile fashion.  Prior to proceeding with procedure a time out was performed.  Attention was turned to the patient's pelvis.  An indwelling foley catheter was placed to decompress the patient's bladder.  An operative speculum was placed to allow visualization of the cervix.  The anterior lip of the cervix was grasped with a single tooth tenaculum, and a large V-care uterine manipulator was placed to allow manipulation of the uterus.  The operative speculum and single tooth tenaculum were then  removed.  Attention was turned to the patient's abdomen.  The umbilicus was infiltrated with 1% Sensorcaine, before making a stab incision using an 11 blade scalpel.  A 73mm Excel trocar was then used to gain direct entry into the peritoneal cavity utilizing the camera to visualize progress of the trocar during placement.  Once peritoneal entry had been achieved, insufflation was started and pneumoperitoneum established at a pressure of 93mmHg.    One left and one right lower quadrant site were then injected with 1% Sensorcaine and a stab incision was made using an 11 blade scalpel.  Two additional 33mm Excel trocars were placed through these incisions under direct visualization. General inspection of the abdomen revealed the above noted findings.   The right tube was identified and grasped at its fimbriated end.  The tube was transected from its attachments to the ovary and mesosalpinx using a 45mm Harmonic scalpel.  The utero ovarian ligament was identified ligated and transected using the Harmonic scalpel. The round ligament was then likewise ligated and transected.  The anterior leaf of the broad ligament was dissected down to the level of the internal cervical os and a bladder flap was started.  The posterior leaf of the broad ligament was dissected down to the utero-sacral ligament.  The uterine artery was skeletonized before being ligated and transected using the Harmonic scalpel with cephelad pressure applied to the V-care device to assure lateralization of the ureter.  A bite was then taken with Harmonic medial to transected portio of uterine artery to further lateralize the ureter and vessel off the V-care cup.  The patient left adnexal structures were then dissected in similar fashion.  The bladder flap was completed and the bladder mobilized off the V-care cup.  An anterior colpotomy was scores and carried around in  a clockwise fashion to free the specimen, which was then removed vaginally.  Inspection  revealed all pedicles to be hemostatic before proceed with vaginal closure.  Attention was turned to the patient pelvis.  An operative speculum was placed and the anterior and posterior portion of the vaginal cuff were tagged with long Alice clamps.  The cuff was closed using interrupted figure of eight 0 Vicryl stitches.  The cuff was hemsotatic at the conclusion of closure without visible or palpable defects.  The indwelling foley catheter was removed.  Cystoscopy was performed noting and intact bladder dome as well as brisk efflux of urine from bother ureteral orifices.  The cystoscopy was removed and the indwelling foley catheter was replaced.  Pneumoperitoneum was re-established, the pelvis was irrigated and all pedicles were once again inspected and noted to be hemostatic.  The cuff appeared well closed and free of bowl or other tissue that may have inadvertently been incorporated into the vaginal closure.  Additional hemostatic agents were not applied to all pedicles.      Pneumoperitoneum was evacuated and trocars were removed.  All trocar sites were then dressed with surgical skin glue.  Sponge needle and instrument counts were correct time two.  The patient tolerated the procedure well and was taken to the recovery room in stable condition.

## 2017-01-14 NOTE — Transfer of Care (Signed)
Immediate Anesthesia Transfer of Care Note  Patient: Courtney Pineda  Procedure(s) Performed: HYSTERECTOMY TOTAL LAPAROSCOPIC WITH SALPINGECTOMY (Bilateral Abdomen) CYSTOSCOPY (N/A Bladder)  Patient Location: PACU  Anesthesia Type:General  Level of Consciousness: sedated  Airway & Oxygen Therapy: Patient Spontanous Breathing  Post-op Assessment: Report given to RN  Post vital signs: Reviewed and stable  Last Vitals:  Vitals:   01/14/17 0845  BP: 140/82  Pulse: 78  Resp: 15  Temp: 36.7 C  SpO2: 100%    Last Pain:  Vitals:   01/14/17 0845  TempSrc: Oral      Patients Stated Pain Goal: 0 (43/60/67 7034)  Complications: No apparent anesthesia complications

## 2017-01-14 NOTE — Anesthesia Postprocedure Evaluation (Signed)
Anesthesia Post Note  Patient: Courtney Pineda  Procedure(s) Performed: HYSTERECTOMY TOTAL LAPAROSCOPIC WITH SALPINGECTOMY (Bilateral Abdomen) CYSTOSCOPY (N/A Bladder)  Patient location during evaluation: PACU Anesthesia Type: General Level of consciousness: awake and alert and oriented Pain management: pain level controlled Vital Signs Assessment: post-procedure vital signs reviewed and stable Respiratory status: spontaneous breathing, nonlabored ventilation and respiratory function stable Cardiovascular status: blood pressure returned to baseline and stable Postop Assessment: no signs of nausea or vomiting Anesthetic complications: no     Last Vitals:  Vitals:   01/14/17 1305 01/14/17 1313  BP:  99/64  Pulse: 69 71  Resp: 12 12  Temp:  37 C  SpO2: 94% 94%    Last Pain:  Vitals:   01/14/17 1313  TempSrc:   PainSc: 4                  Bao Bazen

## 2017-01-15 ENCOUNTER — Encounter: Payer: Self-pay | Admitting: Obstetrics and Gynecology

## 2017-01-15 DIAGNOSIS — N946 Dysmenorrhea, unspecified: Secondary | ICD-10-CM | POA: Diagnosis not present

## 2017-01-15 DIAGNOSIS — D251 Intramural leiomyoma of uterus: Secondary | ICD-10-CM | POA: Diagnosis not present

## 2017-01-15 DIAGNOSIS — Z882 Allergy status to sulfonamides status: Secondary | ICD-10-CM | POA: Diagnosis not present

## 2017-01-15 DIAGNOSIS — N920 Excessive and frequent menstruation with regular cycle: Secondary | ICD-10-CM | POA: Diagnosis not present

## 2017-01-15 DIAGNOSIS — Z880 Allergy status to penicillin: Secondary | ICD-10-CM | POA: Diagnosis not present

## 2017-01-15 DIAGNOSIS — N800 Endometriosis of uterus: Secondary | ICD-10-CM | POA: Diagnosis not present

## 2017-01-15 DIAGNOSIS — N72 Inflammatory disease of cervix uteri: Secondary | ICD-10-CM | POA: Diagnosis not present

## 2017-01-15 LAB — SURGICAL PATHOLOGY

## 2017-01-15 LAB — CBC
HCT: 25.8 % — ABNORMAL LOW (ref 35.0–47.0)
HEMOGLOBIN: 8.1 g/dL — AB (ref 12.0–16.0)
MCH: 21.7 pg — AB (ref 26.0–34.0)
MCHC: 31.2 g/dL — ABNORMAL LOW (ref 32.0–36.0)
MCV: 69.5 fL — ABNORMAL LOW (ref 80.0–100.0)
Platelets: 294 10*3/uL (ref 150–440)
RBC: 3.71 MIL/uL — ABNORMAL LOW (ref 3.80–5.20)
RDW: 18 % — ABNORMAL HIGH (ref 11.5–14.5)
WBC: 10.6 10*3/uL (ref 3.6–11.0)

## 2017-01-15 LAB — BASIC METABOLIC PANEL
Anion gap: 5 (ref 5–15)
BUN: 11 mg/dL (ref 6–20)
CHLORIDE: 108 mmol/L (ref 101–111)
CO2: 24 mmol/L (ref 22–32)
CREATININE: 0.95 mg/dL (ref 0.44–1.00)
Calcium: 8.4 mg/dL — ABNORMAL LOW (ref 8.9–10.3)
GFR calc Af Amer: >60 mL/min (ref >60)
GFR calc non Af Amer: >60 mL/min (ref >60)
Glucose, Bld: 135 mg/dL — ABNORMAL HIGH (ref 65–99)
Potassium: 4.1 mmol/L (ref 3.5–5.1)
SODIUM: 137 mmol/L (ref 135–145)

## 2017-01-15 MED ORDER — OXYCODONE-ACETAMINOPHEN 5-325 MG PO TABS: 1.0000 | ORAL_TABLET | ORAL | 0 refills | Status: DC | PRN

## 2017-01-15 MED ORDER — SCOPOLAMINE 1 MG/3DAYS TD PT72
1.0000 | MEDICATED_PATCH | TRANSDERMAL | Status: DC
  Administered 2017-01-15: 1.5 mg via TRANSDERMAL
  Filled 2017-01-15: qty 1

## 2017-01-15 MED ORDER — IBUPROFEN 400 MG PO TABS
600.0000 mg | ORAL_TABLET | Freq: Four times a day (QID) | ORAL | Status: DC | PRN
  Administered 2017-01-15: 600 mg via ORAL
  Filled 2017-01-15: qty 1

## 2017-01-15 MED ORDER — IBUPROFEN 600 MG PO TABS: 600.0000 mg | ORAL_TABLET | Freq: Four times a day (QID) | ORAL | 0 refills | Status: DC | PRN

## 2017-01-15 NOTE — Discharge Summary (Signed)
Physician Discharge Summary  Patient ID: Courtney Pineda MRN: [id number] DOB/AGE: 06-27-1970 46 y.o.  Admit date: 01/14/2017 Discharge date: 01/15/2017  Admission Diagnoses:  Discharge Diagnoses:  Active Problems:   S/P laparoscopic hysterectomy   Discharged Condition: good  Hospital Course: 46 yo who underwent scheduled laparoscopic hysterectomy, BS, and cystoscopy for pelvic pain, menorrhagia, and degenerating fibroid (also noted to have endometriosis at time of surgery).  Hospital course uncomplicated.  The patient remained hemodynamically stable and afebrile through out admission.  At the time of discharge she was ambulating, voiding, tolerating a general diet, with good pain control on po analgesics   Consults: None  Significant Diagnostic Studies:  Results for orders placed or performed during the hospital encounter of 01/14/17 (from the past 24 hour(s))  Pregnancy, urine POC     Status: None   Collection Time: 01/14/17  8:46 AM  Result Value Ref Range   Preg Test, Ur NEGATIVE NEGATIVE  ABO/Rh     Status: None   Collection Time: 01/14/17  9:40 AM  Result Value Ref Range   ABO/RH(D) B NEG   CBC     Status: Abnormal   Collection Time: 01/15/17  4:24 AM  Result Value Ref Range   WBC 10.6 3.6 - 11.0 K/uL   RBC 3.71 (L) 3.80 - 5.20 MIL/uL   Hemoglobin 8.1 (L) 12.0 - 16.0 g/dL   HCT 25.8 (L) 35.0 - 47.0 %   MCV 69.5 (L) 80.0 - 100.0 fL   MCH 21.7 (L) 26.0 - 34.0 pg   MCHC 31.2 (L) 32.0 - 36.0 g/dL   RDW 18.0 (H) 11.5 - 14.5 %   Platelets 294 150 - 440 K/uL  Basic metabolic panel     Status: Abnormal   Collection Time: 01/15/17  4:24 AM  Result Value Ref Range   Sodium 137 135 - 145 mmol/L   Potassium 4.1 3.5 - 5.1 mmol/L   Chloride 108 101 - 111 mmol/L   CO2 24 22 - 32 mmol/L   Glucose, Bld 135 (H) 65 - 99 mg/dL   BUN 11 6 - 20 mg/dL   Creatinine, Ser 0.95 0.44 - 1.00 mg/dL   Calcium 8.4 (L) 8.9 - 10.3 mg/dL   GFR calc non Af Amer >60 >60 mL/min   GFR calc  Af Amer >60 >60 mL/min   Anion gap 5 5 - 15    Treatments: TLH, BS, cysto  Discharge Exam: Blood pressure (!) 97/52, pulse 63, temperature 98.3 F (36.8 C), temperature source Oral, resp. rate 20, height 5\' 4"  (1.626 m), weight 156 lb (70.8 kg), last menstrual period 01/08/2017, SpO2 98 %. General appearance: alert, appears stated age and no distress Resp: clear to auscultation bilaterally GI: nabs, soft, mildly distended, appropriately tender Extremities: extremities normal, atraumatic, no cyanosis or edema Incision/Wound: D/C/I  Disposition: 01-Home or Self Care  Discharge Instructions    Call MD for:    Complete by:  As directed    Heavy vaginal bleeding greater than 1 pad an hour   Call MD for:  difficulty breathing, headache or visual disturbances    Complete by:  As directed    Call MD for:  extreme fatigue    Complete by:  As directed    Call MD for:  hives    Complete by:  As directed    Call MD for:  persistant dizziness or light-headedness    Complete by:  As directed    Call MD for:  persistant nausea and  vomiting    Complete by:  As directed    Call MD for:  redness, tenderness, or signs of infection (pain, swelling, redness, odor or green/yellow discharge around incision site)    Complete by:  As directed    Call MD for:  severe uncontrolled pain    Complete by:  As directed    Call MD for:  temperature >100.4    Complete by:  As directed    Diet general    Complete by:  As directed    Discharge wound care:    Complete by:  As directed    You may apply a light dressing for minor discharge from the incision or to keep waistbands of clothing from rubbing.  You may also have been discharge with a clear dressing in which case this will be removed at your postoperative clinic visit.  You may shower, use soap on your incision.  Avoiding baths or soaking the incision in the first 6 weeks following your surgery..   Driving restriction    Complete by:  As directed     Avoid driving for at least 2 weeks or while taking prescription narcotics.   Lifting restrictions    Complete by:  As directed    Weight restriction of 10lbs for 6 weeks.   Sexual acrtivity    Complete by:  As directed    No intercourse, tampons, or anything vaginally for 6 weeks     Allergies as of 01/15/2017      Reactions   Bee Venom Anaphylaxis   Sulfonamide Derivatives Anaphylaxis   Lungs close up   Penicillins Rash   Has patient had a PCN reaction causing immediate rash, facial/tongue/throat swelling, SOB or lightheadedness with hypotension: No Has patient had a PCN reaction causing severe rash involving mucus membranes or skin necrosis: No Has patient had a PCN reaction that required hospitalization: No Has patient had a PCN reaction occurring within the last 10 years: No If all of the above answers are "NO", then may proceed with Cephalosporin use.      Medication List    TAKE these medications   ibuprofen 600 MG tablet Commonly known as:  ADVIL,MOTRIN Take 1 tablet (600 mg total) by mouth every 6 (six) hours as needed for fever or headache.   oxyCODONE-acetaminophen 5-325 MG tablet Commonly known as:  PERCOCET/ROXICET Take 1-2 tablets by mouth every 4 (four) hours as needed (moderate to severe pain (when tolerating fluids)).            Discharge Care Instructions        Start     Ordered   01/15/17 0000  Discharge wound care:    Comments:  You may apply a light dressing for minor discharge from the incision or to keep waistbands of clothing from rubbing.  You may also have been discharge with a clear dressing in which case this will be removed at your postoperative clinic visit.  You may shower, use soap on your incision.  Avoiding baths or soaking the incision in the first 6 weeks following your surgery..   01/15/17 4081     Follow-up Information    Malachy Mood, MD In 1 week.   Specialty:  Obstetrics and Gynecology Why:  For wound re-check Contact  information: [address] [place] [address] [phone]           Signed: Malachy Mood 01/15/2017, 7:48 AM

## 2017-01-15 NOTE — Progress Notes (Signed)
Pt discharged home.  Discharge instructions, prescriptions and follow up appointment given to and reviewed with pt.  Pt verbalized understanding.  Escorted by auxillary. 

## 2017-01-19 ENCOUNTER — Ambulatory Visit (INDEPENDENT_AMBULATORY_CARE_PROVIDER_SITE_OTHER): Payer: BLUE CROSS/BLUE SHIELD | Admitting: Obstetrics and Gynecology

## 2017-01-19 ENCOUNTER — Telehealth: Payer: Self-pay

## 2017-01-19 VITALS — BP 126/84 | Temp 98.5°F

## 2017-01-19 DIAGNOSIS — Z90710 Acquired absence of both cervix and uterus: Secondary | ICD-10-CM

## 2017-01-19 NOTE — Progress Notes (Signed)
   Postoperative Follow-up Patient presents post op from TLH/BS/Cysto 5days ago for chronic pelvic pain, menorrhagia, dysmenorrhea, fibroid uterus.  Subjective: Presents due to headache last night.  She states that after discharge she had a temperature to 100.56F, but never higher.  She had taken Tylenol and her temperature normalized.  She had a headache last night and called the on-call physician who instructed her to come in for examination today.  She denies feeling lightheaded, dizzy.  She denies visual changes and sensory changes.  She is tolerating a PO diet without nausea, emesis. She has not yet had a bowel movement, but is passing flatus.  Her pain is controlled.  She is voiding without difficulty.  She is ambulating without difficulty.  She denies chest pain and trouble breathing.  Though she still has some trouble getting around, she denies new abdominal pain. She denies vaginal bleeding.  She denies urinary symptoms.  Objective: Vitals:   01/19/17 1414  BP: 126/84  Temp: 98.5 F (36.9 C)   Vital Signs: BP 126/84   Temp 98.5 F (36.9 C)   LMP 01/08/2017  Physical Exam  Constitutional: She is oriented to person, place, and time. She appears well-developed and well-nourished. No distress.  HENT:  Head: Normocephalic and atraumatic.  Eyes: Conjunctivae are normal. No scleral icterus.  Neck: No thyromegaly present.  Cardiovascular: Normal rate and regular rhythm.  Exam reveals no gallop and no friction rub.   No murmur heard. Pulmonary/Chest: Effort normal and breath sounds normal. No respiratory distress. She has no wheezes. She has no rales.  Abdominal: Soft. She exhibits distension (mildly-moderately distended). She exhibits no mass. There is tenderness (mild ttp). There is no rebound and no guarding.  Bowel sounds present but diminished.   Incision sites all clean, dry, and intact, no erythema, induration, warmth, and tenderness  Genitourinary:  Genitourinary Comments:  Deferred  Musculoskeletal: Normal range of motion. She exhibits no edema or tenderness.  Lymphadenopathy:    She has no cervical adenopathy.  Neurological: She is alert and oriented to person, place, and time. No cranial nerve deficit.  Skin: Skin is warm and dry.  Psychiatric: She has a normal mood and affect. Her behavior is normal. Judgment normal.     Assessment: 46 y.o. s/p TLH/BS/Cysto with a post-operative headache.   Plan: She has no concerning exam findings apart from mild-moderate abdominal distension.  She states that she is passing quite a bit of flatus and her abdominal pain is improving. She is tolerating PO well. She has no evidence of pulmonary, bladder, or incisional infection. She has not technically had a temperature > 10056F and none even close to that in the past few days.  The patient was reassured today.  She was encouraged to return to office ASAP should any concerning symptoms occur (she was referred to her discharge paperwork with specific guidelines).  We discussed watching her bowel function as she has not yet had a bowel movement. She is tolerating PO and is passing flatus.  Her headache does not have an apparent source that is concerning.  She has follow up within the week with Dr. Georgianne Fick. I encouraged her to keep that appointment.   Prentice Docker 01/19/2017, 2:43 PM

## 2017-01-19 NOTE — Telephone Encounter (Signed)
Please advise 

## 2017-01-19 NOTE — Telephone Encounter (Signed)
Pt called after hours on call service to report she has had a low grade fever Sunday night of 100.4. She took ibuprofen and it came down. Monday she had a fever of 99 & took ibuprofen @6 :45 pm. She reported having a headache. On call service advised her to follow provider after surgery instructions in regards to taking pain medications & to contact provider within 24 hours for further advice.

## 2017-01-25 ENCOUNTER — Ambulatory Visit (INDEPENDENT_AMBULATORY_CARE_PROVIDER_SITE_OTHER): Payer: BLUE CROSS/BLUE SHIELD | Admitting: Obstetrics and Gynecology

## 2017-01-25 ENCOUNTER — Encounter: Payer: Self-pay | Admitting: Obstetrics and Gynecology

## 2017-01-25 VITALS — BP 110/70 | HR 64 | Ht 64.0 in | Wt 152.0 lb

## 2017-01-25 DIAGNOSIS — Z4889 Encounter for other specified surgical aftercare: Secondary | ICD-10-CM

## 2017-01-25 NOTE — Progress Notes (Signed)
      Postoperative Follow-up Patient presents post op from laparoscopic hysterectomy, bilateral salpingectomy, and cystoscopy 10days ago for abnormal uterine bleeding and fibroids.  Subjective: Patient reports some improvement in her preop symptoms. Eating a regular diet without difficulty. Pain is controlled without any medications.  Activity: normal activities of daily living.  Pathology reviewed and benign, uterine fibroid and adenomyosis.  Objective: Vitals:   01/25/17 0835  BP: 110/70  Pulse: 64   Gen: NAD Pulmonary: no increased work of breathing Abdomen: NABS, soft, non-tender, non-distended, incision D/C/I Ext: no edema  Assessment: 46 y.o. s/p TLH, BS and cystoscopy stable  Plan: Patient has done well after surgery with no apparent complications.  I have discussed the post-operative course to date, and the expected progress moving forward.  The patient understands what complications to be concerned about.  I will see the patient in routine follow up, or sooner if needed.    Activity plan: no heavy lifting, no intercourse  Malachy Mood 01/25/2017, 10:15 PM

## 2017-01-27 ENCOUNTER — Telehealth: Payer: Self-pay

## 2017-01-27 ENCOUNTER — Ambulatory Visit (INDEPENDENT_AMBULATORY_CARE_PROVIDER_SITE_OTHER): Payer: BLUE CROSS/BLUE SHIELD

## 2017-01-27 DIAGNOSIS — Z23 Encounter for immunization: Secondary | ICD-10-CM

## 2017-01-27 NOTE — Telephone Encounter (Signed)
May have flu sot

## 2017-01-27 NOTE — Telephone Encounter (Signed)
I think it should be fine. Please advise

## 2017-01-27 NOTE — Telephone Encounter (Signed)
Pt had hysterectomy 2 weeks ago; pt said when she takes her pain pill she has nausea and pt wondered if she should get flu shot today. No fever or any cold like symptoms today. Dr Glori Bickers said if pt wants to ck with surgeon that is fine but Dr Glori Bickers thinks as long as pt has no fever she can get flu shot. Pt voiced understanding.

## 2017-01-27 NOTE — Telephone Encounter (Signed)
Pt states she is 2 wks po hysterectomy. She has an apt w/her PCP later this afternoon to get a flu shot. She just wants to be sure it's ok for her to get it since she just had surgery & is still on meds etc. [phone].

## 2017-01-27 NOTE — Telephone Encounter (Signed)
Pt aware. KJ CMA

## 2017-02-09 ENCOUNTER — Telehealth: Payer: Self-pay

## 2017-02-09 ENCOUNTER — Encounter: Payer: Self-pay | Admitting: Obstetrics and Gynecology

## 2017-02-09 NOTE — Telephone Encounter (Signed)
I've sent the patient a mychart message

## 2017-02-09 NOTE — Telephone Encounter (Signed)
Pt had hysterectomy on 01/14/17 and c/o still having pain in stomach area and pain in vaginal area. Pt wants to know if this is normal and if she should be concerned. Post op on 03/02/17. Please advise. Cb# [phone] thank you.

## 2017-03-02 ENCOUNTER — Ambulatory Visit (INDEPENDENT_AMBULATORY_CARE_PROVIDER_SITE_OTHER): Payer: BLUE CROSS/BLUE SHIELD | Admitting: Obstetrics and Gynecology

## 2017-03-02 ENCOUNTER — Encounter: Payer: Self-pay | Admitting: Obstetrics and Gynecology

## 2017-03-02 VITALS — BP 110/68 | HR 77 | Ht 64.0 in | Wt 158.0 lb

## 2017-03-02 DIAGNOSIS — Z4889 Encounter for other specified surgical aftercare: Secondary | ICD-10-CM

## 2017-03-02 NOTE — Progress Notes (Signed)
      Postoperative Follow-up Patient presents post op from [facility], BS, cystoscopy 6weeks ago for abnormal uterine bleeding and uterine fibroids.  Subjective: Patient reports marked improvement in her preop symptoms. Eating a regular diet without difficulty. The patient is not having any pain.  Activity: normal activities of daily living.  Objective: Vitals:   03/02/17 0824  BP: 110/68  Pulse: 77   Gen: NAD HEENT: normocephalic, anicteric Abdomen: soft, non-tender, non-distended, trocar sites well healed GU: normal external female genitalia, vaginal cuff well healed and intact Ext: no edema  Assessment: 46 y.o. s/p TLH, BS, cystoscopy stable  Plan: Patient has done well after surgery with no apparent complications.  I have discussed the post-operative course to date, and the expected progress moving forward.  The patient understands what complications to be concerned about.  I will see the patient in routine follow up, or sooner if needed.    Activity plan: No restriction, no intercourse for additional 2 weeks  Malachy Mood 03/02/2017, 8:30 AM

## 2017-04-29 ENCOUNTER — Telehealth: Payer: Self-pay

## 2017-04-29 NOTE — Telephone Encounter (Signed)
Pt had hyst 01/2017, for the past few weeks she's had a bad d/c c bad odor, bloody d/c.  [phone] Adv needed to be seen.  Appt made for tomorrow c AMS at 2pm.

## 2017-04-30 ENCOUNTER — Ambulatory Visit: Payer: BLUE CROSS/BLUE SHIELD | Admitting: Obstetrics and Gynecology

## 2017-04-30 ENCOUNTER — Encounter: Payer: Self-pay | Admitting: Obstetrics and Gynecology

## 2017-04-30 VITALS — BP 130/80 | HR 79 | Wt 162.0 lb

## 2017-04-30 DIAGNOSIS — N898 Other specified noninflammatory disorders of vagina: Secondary | ICD-10-CM

## 2017-04-30 NOTE — Progress Notes (Signed)
Obstetrics & Gynecology Office Visit   Chief Complaint:  Chief Complaint  Patient presents with  . Vaginal Discharge    brown discharge tinged w/odor  . vaginal irritation    History of Present Illness: [name] is a 47 y.o. [id number] who LMP was Patient's last menstrual period was 01/08/2017., presents today for a problem visit.   Patient complains of an abnormal vaginal discharge for 1 months. Discharge described as: brown and malodorous. Vaginal symptoms include post coital bleeding.Vulvar symptoms include local irritation.STI Risk: Very low risk of STD exposure. .  She denies recent antibiotic exposure, denies changes in soaps, detergents coinciding with the onset of her symptoms.  She has not previously self treated or been under treatment by another provider for these symptoms.   Symptoms have been present since her hysterectomy 01/14/2017.  At her 6 week postoperative tissue she still had some granulation tissue visible.  No fevers, no chills.   Review of Systems: Review of Systems  Constitutional: Negative for chills, fever and weight loss.  Gastrointestinal: Negative for abdominal pain, constipation, diarrhea, nausea and vomiting.  Genitourinary: Negative for dysuria, frequency and urgency.  Skin: Negative for itching and rash.     Past Medical History:  Past Medical History:  Diagnosis Date  . Anemia   . Atypical moles    abnormal moles removed from neck.  . Hyperemesis gravidarum 1999  . Kidney stones     Past Surgical History:  Past Surgical History:  Procedure Laterality Date  . CHOLECYSTECTOMY  06/1994  . CYSTOSCOPY N/A 01/14/2017   Procedure: CYSTOSCOPY;  Surgeon: Malachy Mood, MD;  Location: [facility] ORS;  Service: Gynecology;  Laterality: N/A;  . KIDNEY STONE SURGERY    . LITHOTRIPSY  03/2006  . TONSILECTOMY, ADENOIDECTOMY, BILATERAL MYRINGOTOMY AND TUBES     myringotomy tubes  . TONSILLECTOMY    . TOTAL LAPAROSCOPIC HYSTERECTOMY WITH  SALPINGECTOMY Bilateral 01/14/2017   Procedure: HYSTERECTOMY TOTAL LAPAROSCOPIC WITH SALPINGECTOMY;  Surgeon: Malachy Mood, MD;  Location: [facility] ORS;  Service: Gynecology;  Laterality: Bilateral;    Gynecologic History: Patient's last menstrual period was 01/08/2017.  Obstetric History: [id number]  Family History:  Family History  Problem Relation Age of Onset  . Migraines Mother   . Cancer Paternal Aunt        breast cancer  . Breast cancer Paternal Aunt   . Cancer Maternal Grandmother        brain tumor/breast cancer  . Breast cancer Maternal Grandmother 77  . Heart disease Maternal Grandfather        heart problems    Social History:  Social History   Socioeconomic History  . Marital status: Married    Spouse name: Not on file  . Number of children: Not on file  . Years of education: Not on file  . Highest education level: Not on file  Social Needs  . Financial resource strain: Not on file  . Food insecurity - worry: Not on file  . Food insecurity - inability: Not on file  . Transportation needs - medical: Not on file  . Transportation needs - non-medical: Not on file  Occupational History  . Not on file  Tobacco Use  . Smoking status: Never Smoker  . Smokeless tobacco: Never Used  Substance and Sexual Activity  . Alcohol use: No    Alcohol/week: 0.0 oz  . Drug use: No  . Sexual activity: Yes    Birth control/protection: None  Other Topics Concern  .  Not on file  Social History Narrative  . Not on file    Allergies:  Allergies  Allergen Reactions  . Amoxicillin Anaphylaxis  . Bee Venom Anaphylaxis  . Sulfa Antibiotics Anaphylaxis  . Sulfonamide Derivatives Anaphylaxis    Lungs close up  . Penicillins Rash    Has patient had a PCN reaction causing immediate rash, facial/tongue/throat swelling, SOB or lightheadedness with hypotension: No Has patient had a PCN reaction causing severe rash involving mucus membranes or skin necrosis: No Has patient had a  PCN reaction that required hospitalization: No Has patient had a PCN reaction occurring within the last 10 years: No If all of the above answers are "NO", then may proceed with Cephalosporin use.     Medications: Prior to Admission medications   Not on File    Physical Exam Vitals:  Vitals:   04/30/17 1359  BP: 130/80  Pulse: 79   Patient's last menstrual period was 01/08/2017.  General: NAD HEENT: normocephalic, anicteric Thyroid: no enlargement, no palpable nodules Pulmonary: No increased work of breathing Cardiovascular: RRR, distal pulses 2+ Abdomen: NABS, soft, non-tender, non-distended.  Umbilicus without lesions.  No hepatomegaly, splenomegaly or masses palpable. No evidence of hernia  Genitourinary:  External: Normal external female genitalia.  Normal urethral meatus, normal  Bartholin's and Skene's glands.    Vagina: Normal vaginal mucosa, no evidence of prolapse.  The left aspect of the cuff still continues to show some granulation tissue. There is no defect in the cuff on bimanual exam or when using a Q-tip t to probe.  No stitches present.    Cervix: surgically absent  Uterus: surgically absent  Adnexa: ovaries non-enlarged, no adnexal masses  Rectal: deferred  Lymphatic: no evidence of inguinal lymphadenopathy Extremities: no edema, erythema, or tenderness Neurologic: Grossly intact Psychiatric: mood appropriate, affect full  Female chaperone present for pelvic and breast  portions of the physical exam  Wet Prep: PH: N/A Clue Cells: Negative Fungal elements: Negative Trichomonas: Negative  Assessment: 47 y.o. [id number] with vaginal discharge, vaginal granulation tissue on exam  Plan: Problem List Items Addressed This Visit    None    Visit Diagnoses    Vaginal odor    -  Primary   Relevant Orders   NuSwab BV and Candida, NAA   Vaginal discharge       Relevant Orders   NuSwab BV and Candida, NAA   Granulation tissue of vaginal cuff         -  Still some granulation tissue top left of cuff no defects premarin cream trial (2 weeks samples given).  Will re-examine in 2 weeks to see if with topical estrogen epithelization complete if not discussed excising area in question operatively. - Wet mount negative nuswab sent - A total of 15 minutes were spent in face-to-face contact with the patient during this encounter with over half of that time devoted to counseling and coordination of care. -Return in about 2 weeks (around 05/14/2017) for pelvic exam.

## 2017-05-03 LAB — NUSWAB BV AND CANDIDA, NAA
Candida albicans, NAA: NEGATIVE
Candida glabrata, NAA: NEGATIVE

## 2017-05-17 ENCOUNTER — Ambulatory Visit: Payer: BLUE CROSS/BLUE SHIELD | Admitting: Obstetrics and Gynecology

## 2017-05-17 ENCOUNTER — Encounter: Payer: Self-pay | Admitting: Obstetrics and Gynecology

## 2017-05-17 VITALS — BP 120/72 | HR 79 | Wt 158.0 lb

## 2017-05-17 DIAGNOSIS — N898 Other specified noninflammatory disorders of vagina: Secondary | ICD-10-CM

## 2017-05-17 NOTE — Progress Notes (Signed)
      Postoperative Follow-up Patient presents post op from [facility], BS, and cystoscopy on 01/14/2017 for abnormal uterine bleeding.  Subjective: Patient reports marked improvement in her preop symptoms. Eating a regular diet without difficulty. The patient is not having any pain.  Activity: normal activities of daily living. Patient had granulation tissue at cuff with continued bleeding noted.  She was started on vaginal premarin cream with improvement in symptoms.  Objective: Vitals:   05/17/17 1350  BP: 120/72  Pulse: 79   Gen: NAD HEENT: normocephalic, anicteric Pulmonary: no increased work of breathing GU: normal external female genitalia, normal well healed vaginal tissue.  Previously noted granulation tissue on left aspect of cuff has resolved with application of topical premarin Ext: no edema  Assessment: 47 y.o. s/p TLH, BS, cysto stable  Plan: Patient has done well after surgery with no apparent complications.  I have discussed the post-operative course to date, and the expected progress moving forward.  The patient understands what complications to be concerned about.  I will see the patient in routine follow up, or sooner if needed.    Activity plan: No restriction.  Malachy Mood 05/17/2017, 5:50 PM

## 2017-05-22 ENCOUNTER — Telehealth: Payer: Self-pay | Admitting: Family Medicine

## 2017-05-22 DIAGNOSIS — R7309 Other abnormal glucose: Secondary | ICD-10-CM

## 2017-05-22 DIAGNOSIS — Z0000 Encounter for general adult medical examination without abnormal findings: Secondary | ICD-10-CM

## 2017-05-22 NOTE — Telephone Encounter (Signed)
-----   Message from Ellamae Sia sent at 05/20/2017  5:04 PM EST ----- Regarding: Lab orders for Friday, 2.15.19 Patient is scheduled for CPX labs, please order future labs, Thanks , Karna Christmas

## 2017-05-24 ENCOUNTER — Encounter: Payer: Self-pay | Admitting: Family Medicine

## 2017-05-26 ENCOUNTER — Other Ambulatory Visit: Payer: BLUE CROSS/BLUE SHIELD

## 2017-05-28 ENCOUNTER — Other Ambulatory Visit (INDEPENDENT_AMBULATORY_CARE_PROVIDER_SITE_OTHER): Payer: BLUE CROSS/BLUE SHIELD

## 2017-05-28 DIAGNOSIS — Z0000 Encounter for general adult medical examination without abnormal findings: Secondary | ICD-10-CM

## 2017-05-28 DIAGNOSIS — R7309 Other abnormal glucose: Secondary | ICD-10-CM | POA: Diagnosis not present

## 2017-05-28 LAB — LIPID PANEL
CHOL/HDL RATIO: 5
Cholesterol: 161 mg/dL (ref 0–200)
HDL: 34.6 mg/dL — AB (ref >39.00)
LDL CALC: 109 mg/dL — AB (ref 0–99)
NonHDL: 126.49
TRIGLYCERIDES: 88 mg/dL (ref 0.0–149.0)
VLDL: 17.6 mg/dL (ref 0.0–40.0)

## 2017-05-28 LAB — COMPREHENSIVE METABOLIC PANEL
ALBUMIN: 4 g/dL (ref 3.5–5.2)
ALK PHOS: 94 U/L (ref 39–117)
ALT: 16 U/L (ref 0–35)
AST: 20 U/L (ref 0–37)
BUN: 12 mg/dL (ref 6–23)
CALCIUM: 9.2 mg/dL (ref 8.4–10.5)
CHLORIDE: 104 meq/L (ref 96–112)
CO2: 28 mEq/L (ref 19–32)
CREATININE: 0.88 mg/dL (ref 0.40–1.20)
GFR: 74.14 mL/min (ref >60.00)
Glucose, Bld: 92 mg/dL (ref 70–99)
Potassium: 4.3 mEq/L (ref 3.5–5.1)
Sodium: 140 mEq/L (ref 135–145)
Total Bilirubin: 0.7 mg/dL (ref 0.2–1.2)
Total Protein: 7 g/dL (ref 6.0–8.3)

## 2017-05-28 LAB — TSH: TSH: 1.33 u[IU]/mL (ref 0.35–4.50)

## 2017-05-28 LAB — HEMOGLOBIN A1C: Hgb A1c MFr Bld: 5.4 % (ref 4.6–6.5)

## 2017-05-31 ENCOUNTER — Encounter: Payer: Self-pay | Admitting: Family Medicine

## 2017-05-31 ENCOUNTER — Ambulatory Visit: Payer: BLUE CROSS/BLUE SHIELD | Admitting: Family Medicine

## 2017-05-31 VITALS — BP 112/66 | HR 66 | Temp 98.2°F | Ht 64.0 in | Wt 161.0 lb

## 2017-05-31 DIAGNOSIS — R319 Hematuria, unspecified: Secondary | ICD-10-CM

## 2017-05-31 DIAGNOSIS — R109 Unspecified abdominal pain: Secondary | ICD-10-CM | POA: Diagnosis not present

## 2017-05-31 DIAGNOSIS — R1030 Lower abdominal pain, unspecified: Secondary | ICD-10-CM | POA: Diagnosis not present

## 2017-05-31 DIAGNOSIS — R1011 Right upper quadrant pain: Secondary | ICD-10-CM | POA: Insufficient documentation

## 2017-05-31 LAB — POC URINALSYSI DIPSTICK (AUTOMATED)
BILIRUBIN UA: NEGATIVE
Blood, UA: 25
Glucose, UA: NEGATIVE
KETONES UA: NEGATIVE
LEUKOCYTES UA: NEGATIVE
NITRITE UA: NEGATIVE
PROTEIN UA: NEGATIVE
Spec Grav, UA: >=1.03 — AB (ref 1.010–1.025)
Urobilinogen, UA: 0.2 E.U./dL
pH, UA: 6 (ref 5.0–8.0)

## 2017-05-31 NOTE — Assessment & Plan Note (Signed)
Vague/right sided and occ waking her up  Has had ccy (and hysterectomy in the fall)  H/o dyspepsia and kidney stones in the past-states this seems different  Lab today incl cbc amy/lipase and urine cx (blood cells in urine-per pt still spotting from her surgery) Start zantac 150 bid/ bland diet  If nl and no imp consider CT If worse in the meantime inst to go to ED

## 2017-05-31 NOTE — Assessment & Plan Note (Signed)
Likely due to spotting after hysterectomy  ucx sent also  Hx of renal stones-pt does not think this is that

## 2017-05-31 NOTE — Patient Instructions (Signed)
Labs today  I will also send your urine for a culture  If you get pain that feels like a kidney stone-let us know   Drink fluids Eat bland   Get some zantac over the counter 150 mg and take it twice daily to see if it helps   We may end up ordering a CT scan -will let you know   If pain suddenly worsens- go to the ER and alert [place]

## 2017-05-31 NOTE — Progress Notes (Signed)
Subjective:    Patient ID: Courtney Pineda, female    DOB: 1971/03/24, 47 y.o.   MRN: [id number]  HPI Here for abdominal pain   Has had a hysterectomy this fall (total) - was out for 6 weeks Had some complications - still had some spotting after wards / vaginal cuff took a long time to heal - that is doing better   H/o kidney stones in the past   She has had dyspepsia in the past   Wt Readings from Last 3 Encounters:  05/31/17 161 lb (73 kg)  05/17/17 158 lb (71.7 kg)  04/30/17 162 lb (73.5 kg)   27.64 kg/m     ua today  Results for orders placed or performed in visit on 05/31/17  POCT Urinalysis Dipstick (Automated)  Result Value Ref Range   Color, UA Yellow    Clarity, UA Clear    Glucose, UA Negative    Bilirubin, UA Negative    Ketones, UA Negative    Spec Grav, UA >=1.030 (A) 1.010 - 1.025   Blood, UA 25 Ery/uL    pH, UA 6.0 5.0 - 8.0   Protein, UA Negative    Urobilinogen, UA 0.2 0.2 or 1.0 E.U./dL   Nitrite, UA Negative    Leukocytes, UA Negative Negative     Recent labs Lab Results  Component Value Date   CREATININE 0.88 05/28/2017   BUN 12 05/28/2017   NA 140 05/28/2017   K 4.3 05/28/2017   CL 104 05/28/2017   CO2 28 05/28/2017   Lab Results  Component Value Date   ALT 16 05/28/2017   AST 20 05/28/2017   ALKPHOS 94 05/28/2017   BILITOT 0.7 05/28/2017     Having some pain in R side of abdomen (upper and middle)       ccy 1997  Woke her up this am  Feels lumpy to the touch  Sharp and dull off and on  Nothing seems to make it better or worse  Eating does not make it better or worse   No heartburn Had a bad /metal taste in her mouth last week No coating on tongue   No n/v  occ pain is low   Of note-exercising more - walking at the mall 30 min per day   No diarrhea/constipation  No blood in stool or dark stool   Lab Results  Component Value Date   WBC 10.6 01/15/2017   HGB 8.1 (L) 01/15/2017   HCT 25.8 (L) 01/15/2017   MCV 69.5 (L) 01/15/2017   PLT 294 01/15/2017    Had a CT scan 5/18  Study Result   CLINICAL DATA:  Right lower quadrant pain.  Vomiting.  EXAM: CT ABDOMEN AND PELVIS WITH CONTRAST  TECHNIQUE: Multidetector CT imaging of the abdomen and pelvis was performed using the standard protocol following bolus administration of intravenous contrast.  CONTRAST:  150mL ISOVUE-300 IOPAMIDOL (ISOVUE-300) INJECTION 61%  COMPARISON:  CT 08/03/2016  FINDINGS: Lower chest: Linear atelectasis in the lung bases. No pleural fluid. Heart is normal in size. Minimal thickening the distal esophagus.  Hepatobiliary: Focal fatty infiltration adjacent to the falciform ligament. No focal lesion. Postcholecystectomy with unchanged biliary prominence, likely sequela of prior cholecystectomy.  Pancreas: No ductal dilatation or inflammation.  Spleen: Normal in size without focal abnormality.  Adrenals/Urinary Tract: Normal adrenal glands. No hydronephrosis or perinephric edema. Nonobstructing stones in left kidney again seen. Simple cyst in the lower right kidney, unchanged from prior. Urinary bladder is  decompressed.  Stomach/Bowel: Possible thickening of the distal esophagus. Stomach is minimally distended. No bowel wall thickening, inflammation or obstruction. Normal appendix.  Vascular/Lymphatic: Prominent left ovarian veins. Normal caliber abdominal aorta. No abdominal or pelvic adenopathy.  Reproductive: Posterior fundal fibroid measures 4.8 cm with peripheral enhancement and central hypodensity suggesting necrosis. Prominent periuterine and adnexal vascularity with dilatation of the left ovarian vein. Ovaries are normal in size. No adnexal mass.  Other: Trace free fluid in the pelvis. No free air. No upper abdominal ascites. Tiny fat containing umbilical hernia.  Musculoskeletal: There are no acute or suspicious osseous abnormalities.  IMPRESSION: 1. Uterine fibroid  measures 4.7 cm and is likely necrotic. Prominent periuterine and adnexal vascularity suggesting pelvic congestion syndrome. 2. Mild distal esophageal wall thickening, can be seen with reflux or esophagitis. 3. Normal appendix.    Patient Active Problem List   Diagnosis Date Noted  . Right sided abdominal pain 05/31/2017  . Hematuria 05/31/2017  . Elevated glucose level 05/22/2017  . S/P laparoscopic hysterectomy 01/14/2017  . Abdominal pain 07/27/2016  . Frequent urination 07/27/2016  . Dyspepsia 07/27/2016  . Right wrist pain 06/01/2016  . Daily headache 06/01/2016  . Cough 06/01/2016  . Encounter for routine gynecological examination 05/23/2014  . Dyspareunia 05/23/2014  . Burning sensation of mouth 09/12/2013  . Anemia, mild 01/13/2011  . Abnormal transaminases 01/13/2011  . Routine general medical examination at a health care facility 01/05/2011  . KIDNEY STONE 03/27/2010  . STRESS REACTION, ACUTE, WITH EMOTIONAL DISTURBANCE 09/13/2007  . HEADACHE 09/13/2007  . NEVUS, ATYPICAL 08/20/2006   Past Medical History:  Diagnosis Date  . Anemia   . Atypical moles    abnormal moles removed from neck.  . Hyperemesis gravidarum 1999  . Kidney stones    Past Surgical History:  Procedure Laterality Date  . CHOLECYSTECTOMY  06/1994  . CYSTOSCOPY N/A 01/14/2017   Procedure: CYSTOSCOPY;  Surgeon: Malachy Mood, MD;  Location: [facility];  Service: Gynecology;  Laterality: N/A;  . KIDNEY STONE SURGERY    . LITHOTRIPSY  03/2006  . TONSILECTOMY, ADENOIDECTOMY, BILATERAL MYRINGOTOMY AND TUBES     myringotomy tubes  . TONSILLECTOMY    . TOTAL LAPAROSCOPIC HYSTERECTOMY WITH SALPINGECTOMY Bilateral 01/14/2017   Procedure: HYSTERECTOMY TOTAL LAPAROSCOPIC WITH SALPINGECTOMY;  Surgeon: Malachy Mood, MD;  Location: [facility];  Service: Gynecology;  Laterality: Bilateral;   Social History   Tobacco Use  . Smoking status: Never Smoker  . Smokeless tobacco: Never Used  Substance  Use Topics  . Alcohol use: No    Alcohol/week: 0.0 oz  . Drug use: No   Family History  Problem Relation Age of Onset  . Migraines Mother   . Cancer Paternal Aunt        breast cancer  . Breast cancer Paternal Aunt   . Cancer Maternal Grandmother        brain tumor/breast cancer  . Breast cancer Maternal Grandmother 6  . Heart disease Maternal Grandfather        heart problems   Allergies  Allergen Reactions  . Amoxicillin Anaphylaxis  . Bee Venom Anaphylaxis  . Sulfa Antibiotics Anaphylaxis  . Sulfonamide Derivatives Anaphylaxis    Lungs close up  . Penicillins Rash    Has patient had a PCN reaction causing immediate rash, facial/tongue/throat swelling, SOB or lightheadedness with hypotension: No Has patient had a PCN reaction causing severe rash involving mucus membranes or skin necrosis: No Has patient had a PCN reaction that required hospitalization: No Has  patient had a PCN reaction occurring within the last 10 years: No If all of the above answers are "NO", then may proceed with Cephalosporin use.    No current outpatient medications on file prior to visit.   No current facility-administered medications on file prior to visit.     Review of Systems  Constitutional: Negative for activity change, appetite change, fatigue, fever and unexpected weight change.  HENT: Negative for congestion, ear pain, rhinorrhea, sinus pressure and sore throat.   Eyes: Negative for pain, redness and visual disturbance.  Respiratory: Negative for cough, shortness of breath and wheezing.   Cardiovascular: Negative for chest pain and palpitations.  Gastrointestinal: Positive for abdominal pain. Negative for abdominal distention, anal bleeding, blood in stool, constipation, diarrhea, nausea, rectal pain and vomiting.  Endocrine: Negative for polydipsia and polyuria.  Genitourinary: Negative for dysuria, frequency and urgency.  Musculoskeletal: Negative for arthralgias, back pain and  myalgias.  Skin: Negative for pallor and rash.  Allergic/Immunologic: Negative for environmental allergies.  Neurological: Negative for dizziness, syncope and headaches.  Hematological: Negative for adenopathy. Does not bruise/bleed easily.  Psychiatric/Behavioral: Negative for decreased concentration and dysphoric mood. The patient is not nervous/anxious.        Objective:   Physical Exam  Constitutional: She appears well-developed and well-nourished. No distress.  overwt and well appearing  HENT:  Head: Normocephalic and atraumatic.  Mouth/Throat: Oropharynx is clear and moist.  Eyes: Conjunctivae and EOM are normal. Pupils are equal, round, and reactive to light. No scleral icterus.  Neck: Normal range of motion. Neck supple.  Cardiovascular: Normal rate, regular rhythm and normal heart sounds.  Pulmonary/Chest: Effort normal and breath sounds normal. No respiratory distress. She has no wheezes. She has no rales.  Abdominal: Soft. Bowel sounds are normal. She exhibits no distension, no pulsatile liver, no abdominal bruit, no pulsatile midline mass and no mass. There is no hepatosplenomegaly. There is tenderness in the right upper quadrant, epigastric area and periumbilical area. There is no rebound, no guarding, no CVA tenderness, no tenderness at McBurney's point and negative Murphy's sign.  Lymphadenopathy:    She has no cervical adenopathy.  Neurological: She is alert.  Skin: Skin is warm and dry. No rash noted. No erythema. No pallor.  Psychiatric: She has a normal mood and affect.          Assessment & Plan:   Problem List Items Addressed This Visit      Other   Abdominal pain    Vague/right sided and occ waking her up  Has had ccy (and hysterectomy in the fall)  H/o dyspepsia and kidney stones in the past-states this seems different  Lab today incl cbc amy/lipase and urine cx (blood cells in urine-per pt still spotting from her surgery) Start zantac 150 bid/ bland  diet  If nl and no imp consider CT If worse in the meantime inst to go to ED      Relevant Orders   POCT Urinalysis Dipstick (Automated) (Completed)   Hematuria    Likely due to spotting after hysterectomy  ucx sent also  Hx of renal stones-pt does not think this is that      Relevant Orders   Urine Culture   Right sided abdominal pain - Primary    Vague/right sided and occ waking her up  Has had ccy (and hysterectomy in the fall)  H/o dyspepsia and kidney stones in the past-states this seems different  Lab today incl cbc amy/lipase and urine cx (  blood cells in urine-per pt still spotting from her surgery) Start zantac 150 bid/ bland diet  If nl and no imp consider CT If worse in the meantime inst to go to ED      Relevant Orders   CBC with Differential/Platelet   Amylase   Lipase

## 2017-06-01 LAB — CBC WITH DIFFERENTIAL/PLATELET
BASOS ABS: 0 10*3/uL (ref 0.0–0.1)
Basophils Relative: 0.6 % (ref 0.0–3.0)
Eosinophils Absolute: 0.2 10*3/uL (ref 0.0–0.7)
Eosinophils Relative: 2.4 % (ref 0.0–5.0)
HEMATOCRIT: 41.7 % (ref 36.0–46.0)
Hemoglobin: 13.6 g/dL (ref 12.0–15.0)
LYMPHS PCT: 30 % (ref 12.0–46.0)
Lymphs Abs: 2.4 10*3/uL (ref 0.7–4.0)
MCHC: 32.7 g/dL (ref 30.0–36.0)
MCV: 79.9 fl (ref 78.0–100.0)
MONOS PCT: 7.2 % (ref 3.0–12.0)
Monocytes Absolute: 0.6 10*3/uL (ref 0.1–1.0)
Neutro Abs: 4.8 10*3/uL (ref 1.4–7.7)
Neutrophils Relative %: 59.8 % (ref 43.0–77.0)
Platelets: 312 10*3/uL (ref 150.0–400.0)
RBC: 5.22 Mil/uL — AB (ref 3.87–5.11)
RDW: 19.4 % — ABNORMAL HIGH (ref 11.5–15.5)
WBC: 8 10*3/uL (ref 4.0–10.5)

## 2017-06-01 LAB — AMYLASE: Amylase: 59 U/L (ref 27–131)

## 2017-06-01 LAB — URINE CULTURE
MICRO NUMBER:: 90212672
SPECIMEN QUALITY:: ADEQUATE

## 2017-06-01 LAB — LIPASE: Lipase: 48 U/L (ref 11.0–59.0)

## 2017-06-02 ENCOUNTER — Encounter: Payer: BLUE CROSS/BLUE SHIELD | Admitting: Family Medicine

## 2017-06-02 ENCOUNTER — Telehealth: Payer: Self-pay | Admitting: Family Medicine

## 2017-06-02 DIAGNOSIS — R109 Unspecified abdominal pain: Secondary | ICD-10-CM

## 2017-06-02 DIAGNOSIS — R319 Hematuria, unspecified: Secondary | ICD-10-CM

## 2017-06-02 NOTE — Telephone Encounter (Signed)
Rosaria Ferries- I am ref her for CT abd and pelvis First avail please  She pref [place] but can change if needed for earlier test  Thanks

## 2017-06-03 ENCOUNTER — Ambulatory Visit
Admission: RE | Admit: 2017-06-03 | Discharge: 2017-06-03 | Disposition: A | Discharge status: Home / Self Care | Payer: BLUE CROSS/BLUE SHIELD | Source: Ambulatory Visit | Attending: Family Medicine | Admitting: Family Medicine

## 2017-06-03 DIAGNOSIS — R319 Hematuria, unspecified: Secondary | ICD-10-CM | POA: Diagnosis not present

## 2017-06-03 DIAGNOSIS — R109 Unspecified abdominal pain: Secondary | ICD-10-CM | POA: Diagnosis not present

## 2017-06-03 DIAGNOSIS — N200 Calculus of kidney: Secondary | ICD-10-CM | POA: Insufficient documentation

## 2017-06-03 DIAGNOSIS — N289 Disorder of kidney and ureter, unspecified: Secondary | ICD-10-CM | POA: Insufficient documentation

## 2017-06-03 MED ORDER — IOPAMIDOL (ISOVUE-300) INJECTION 61%
100.0000 mL | Freq: Once | INTRAVENOUS | Status: AC | PRN
  Administered 2017-06-03: 100 mL via INTRAVENOUS

## 2017-06-03 NOTE — Telephone Encounter (Signed)
CT scan scheduled at [facility] and patient aware. Call report requested.

## 2017-06-04 ENCOUNTER — Ambulatory Visit (INDEPENDENT_AMBULATORY_CARE_PROVIDER_SITE_OTHER): Payer: BLUE CROSS/BLUE SHIELD | Admitting: Family Medicine

## 2017-06-04 ENCOUNTER — Encounter: Payer: Self-pay | Admitting: Family Medicine

## 2017-06-04 VITALS — BP 132/90 | HR 64 | Temp 98.2°F | Ht 64.25 in | Wt 159.0 lb

## 2017-06-04 DIAGNOSIS — Z0001 Encounter for general adult medical examination with abnormal findings: Secondary | ICD-10-CM | POA: Diagnosis not present

## 2017-06-04 DIAGNOSIS — R109 Unspecified abdominal pain: Secondary | ICD-10-CM

## 2017-06-04 DIAGNOSIS — R7309 Other abnormal glucose: Secondary | ICD-10-CM | POA: Diagnosis not present

## 2017-06-04 DIAGNOSIS — R1084 Generalized abdominal pain: Secondary | ICD-10-CM

## 2017-06-04 DIAGNOSIS — N2889 Other specified disorders of kidney and ureter: Secondary | ICD-10-CM

## 2017-06-04 DIAGNOSIS — N281 Cyst of kidney, acquired: Secondary | ICD-10-CM | POA: Insufficient documentation

## 2017-06-04 DIAGNOSIS — Z0000 Encounter for general adult medical examination without abnormal findings: Secondary | ICD-10-CM

## 2017-06-04 NOTE — Progress Notes (Signed)
Subjective:    Patient ID: Courtney Pineda, female    DOB: 12-May-1970, 47 y.o.   MRN: [id number]  HPI Here for health maintenance exam and to review chronic medical problems    Also f/u of R sided abd pain  Had CT done   Still has pain  Sun/mon woke her up  Gets sharp pains- at times fleeting  Not waking her up any more  No help with zantac  No burning/heartburn    Ct Abdomen Pelvis W Contrast  Result Date: 06/03/2017 CLINICAL DATA:  Right abdominal pain, worsening over 5 days. History of nephrolithiasis. Prior hysterectomy and cholecystectomy. EXAM: CT ABDOMEN AND PELVIS WITH CONTRAST TECHNIQUE: Multidetector CT imaging of the abdomen and pelvis was performed using the standard protocol following bolus administration of intravenous contrast. CONTRAST:  15mL ISOVUE-300 IOPAMIDOL (ISOVUE-300) INJECTION 61% COMPARISON:  08/24/2016 CT abdomen/pelvis. FINDINGS: Lower chest: No significant pulmonary nodules or acute consolidative airspace disease. Hepatobiliary: Normal liver size. No liver mass. Cholecystectomy. No biliary ductal dilatation. Pancreas: Normal, with no mass or duct dilation. Spleen: Normal size. No mass. Adrenals/Urinary Tract: Normal adrenals. Multiple nonobstructing stones in the mid to lower left kidney, largest 4 mm in the interpolar left kidney. No hydronephrosis. Hypodense 1.4 cm inferior right renal cortical mass with density 29 HU (series 2/image 36), previously 1.3 cm, minimally increased. No additional renal lesions. Normal bladder. Stomach/Bowel: Normal non-distended stomach. Normal caliber Brau bowel with no Blossom bowel wall thickening. Normal appendix. Normal large bowel with no diverticulosis, large bowel wall thickening or pericolonic fat stranding. Oral contrast progresses to the left colon. Vascular/Lymphatic: Normal caliber abdominal aorta. Patent portal, splenic, hepatic and renal veins. No pathologically enlarged lymph nodes in the abdomen or pelvis.  Reproductive: Status post hysterectomy, with no abnormal findings at the vaginal cuff. Simple 1.5 cm right adnexal cyst and simple 2.0 cm left adnexal cyst, for which no further evaluation is required (This recommendation follows ACR consensus guidelines: White Paper of the ACR Incidental Findings Committee II on Adnexal Findings. J Am Coll Radiol [phone]). Other: No pneumoperitoneum, ascites or focal fluid collection. Musculoskeletal: No aggressive appearing focal osseous lesions. Minimal thoracolumbar spondylosis. IMPRESSION: 1. No acute abnormality. No evidence of bowel obstruction or acute bowel inflammation. Normal appendix. 2. Nonobstructing left nephrolithiasis.  No hydronephrosis. 3. Indeterminate hypodense 1.4 cm inferior right renal cortical mass, minimally increased in size. MRI abdomen without and with IV contrast is recommended for further characterization. This recommendation follows ACR consensus guidelines: Management of the Incidental Renal Mass on CT: A White Paper of the ACR Incidental Findings Committee. J Am Coll Radiol [phone]. These results will be called to the ordering clinician or representative by the Radiology Department at the imaging location. Electronically Signed   By: Ilona Sorrel M.D.   On: 06/03/2017 10:42     Wt Readings from Last 3 Encounters:  06/04/17 159 lb (72.1 kg)  05/31/17 161 lb (73 kg)  05/17/17 158 lb (71.7 kg)   27.08 kg/m   Mammogram 2/18 nl - she called to schedule it and had to wait until now to schedule  Self breast exam -no lumps   Pap/gyn care  Had hysterectomy for fibroids in the fall Still spotting a bit from the surgery - not lately   BP BP Readings from Last 3 Encounters:  06/04/17 132/90  05/31/17 112/66  05/17/17 120/72   Anemia is resolved with no menses Lab Results  Component Value Date   WBC 8.0 05/31/2017   HGB  13.6 05/31/2017   HCT 41.7 05/31/2017   MCV 79.9 05/31/2017   PLT 312.0 05/31/2017    Other  labs ua and ucx neg   Lab Results  Component Value Date   CREATININE 0.88 05/28/2017   BUN 12 05/28/2017   NA 140 05/28/2017   K 4.3 05/28/2017   CL 104 05/28/2017   CO2 28 05/28/2017   Lab Results  Component Value Date   ALT 16 05/28/2017   AST 20 05/28/2017   ALKPHOS 94 05/28/2017   BILITOT 0.7 05/28/2017   glucose 92  Lab Results  Component Value Date   TSH 1.33 05/28/2017    Hx of elevated glucose level  Lab Results  Component Value Date   HGBA1C 5.4 05/28/2017  follows a healthy diet / husb is DM  Eating fruit/veg/salads and grilled chicken   Supper can be unpredictable  Cholesterol  Lab Results  Component Value Date   CHOL 161 05/28/2017   CHOL 144 05/25/2016   CHOL 141 05/20/2015   Lab Results  Component Value Date   HDL 34.60 (L) 05/28/2017   HDL 42.00 05/25/2016   HDL 35.70 (L) 05/20/2015   Lab Results  Component Value Date   LDLCALC 109 (H) 05/28/2017   LDLCALC 89 05/25/2016   LDLCALC 95 05/20/2015   Lab Results  Component Value Date   TRIG 88.0 05/28/2017   TRIG 63.0 05/25/2016   TRIG 52.0 05/20/2015   Lab Results  Component Value Date   CHOLHDL 5 05/28/2017   CHOLHDL 3 05/25/2016   CHOLHDL 4 05/20/2015   No results found for: LDLDIRECT Has been going to the mall at lunch and walking when she can  She eats tuna (low fat)  Reduced fat salad dressing    Patient Active Problem List   Diagnosis Date Noted  . Right kidney mass 06/04/2017  . Right sided abdominal pain 05/31/2017  . Hematuria 05/31/2017  . Elevated glucose level 05/22/2017  . S/P laparoscopic hysterectomy 01/14/2017  . Abdominal pain 07/27/2016  . Frequent urination 07/27/2016  . Dyspepsia 07/27/2016  . Right wrist pain 06/01/2016  . Daily headache 06/01/2016  . Cough 06/01/2016  . Encounter for routine gynecological examination 05/23/2014  . Dyspareunia 05/23/2014  . Burning sensation of mouth 09/12/2013  . Routine general medical examination at a health care  facility 01/05/2011  . KIDNEY STONE 03/27/2010  . STRESS REACTION, ACUTE, WITH EMOTIONAL DISTURBANCE 09/13/2007  . HEADACHE 09/13/2007  . NEVUS, ATYPICAL 08/20/2006   Past Medical History:  Diagnosis Date  . Anemia   . Atypical moles    abnormal moles removed from neck.  . Hyperemesis gravidarum 1999  . Kidney stones    Past Surgical History:  Procedure Laterality Date  . CHOLECYSTECTOMY  06/1994  . CYSTOSCOPY N/A 01/14/2017   Procedure: CYSTOSCOPY;  Surgeon: Malachy Mood, MD;  Location: [facility];  Service: Gynecology;  Laterality: N/A;  . KIDNEY STONE SURGERY    . LITHOTRIPSY  03/2006  . TONSILECTOMY, ADENOIDECTOMY, BILATERAL MYRINGOTOMY AND TUBES     myringotomy tubes  . TONSILLECTOMY    . TOTAL LAPAROSCOPIC HYSTERECTOMY WITH SALPINGECTOMY Bilateral 01/14/2017   Procedure: HYSTERECTOMY TOTAL LAPAROSCOPIC WITH SALPINGECTOMY;  Surgeon: Malachy Mood, MD;  Location: [facility];  Service: Gynecology;  Laterality: Bilateral;   Social History   Tobacco Use  . Smoking status: Never Smoker  . Smokeless tobacco: Never Used  Substance Use Topics  . Alcohol use: No    Alcohol/week: 0.0 oz  . Drug use: No  Family History  Problem Relation Age of Onset  . Migraines Mother   . Cancer Paternal Aunt        breast cancer  . Breast cancer Paternal Aunt   . Cancer Maternal Grandmother        brain tumor/breast cancer  . Breast cancer Maternal Grandmother 46  . Heart disease Maternal Grandfather        heart problems   Allergies  Allergen Reactions  . Amoxicillin Anaphylaxis  . Bee Venom Anaphylaxis  . Sulfa Antibiotics Anaphylaxis  . Sulfonamide Derivatives Anaphylaxis    Lungs close up  . Penicillins Rash    Has patient had a PCN reaction causing immediate rash, facial/tongue/throat swelling, SOB or lightheadedness with hypotension: No Has patient had a PCN reaction causing severe rash involving mucus membranes or skin necrosis: No Has patient had a PCN reaction that  required hospitalization: No Has patient had a PCN reaction occurring within the last 10 years: No If all of the above answers are "NO", then may proceed with Cephalosporin use.    No current outpatient medications on file prior to visit.   No current facility-administered medications on file prior to visit.     Review of Systems  Constitutional: Negative for activity change, appetite change, fatigue, fever and unexpected weight change.  HENT: Negative for congestion, ear pain, rhinorrhea, sinus pressure and sore throat.   Eyes: Negative for pain, redness and visual disturbance.  Respiratory: Negative for cough, shortness of breath and wheezing.   Cardiovascular: Negative for chest pain and palpitations.  Gastrointestinal: Positive for abdominal pain. Negative for abdominal distention, blood in stool, constipation and diarrhea.  Endocrine: Negative for polydipsia and polyuria.  Genitourinary: Negative for dysuria, frequency, hematuria and urgency.  Musculoskeletal: Negative for arthralgias, back pain and myalgias.  Skin: Negative for pallor and rash.  Allergic/Immunologic: Negative for environmental allergies.  Neurological: Negative for dizziness, syncope and headaches.  Hematological: Negative for adenopathy. Does not bruise/bleed easily.  Psychiatric/Behavioral: Negative for decreased concentration and dysphoric mood. The patient is not nervous/anxious.        Objective:   Physical Exam  Constitutional: She appears well-developed and well-nourished. No distress.  overwt and well app  HENT:  Head: Normocephalic and atraumatic.  Right Ear: External ear normal.  Left Ear: External ear normal.  Mouth/Throat: Oropharynx is clear and moist.  Eyes: Conjunctivae and EOM are normal. Pupils are equal, round, and reactive to light. No scleral icterus.  Neck: Normal range of motion. Neck supple. No JVD present. Carotid bruit is not present. No thyromegaly present.  Cardiovascular: Normal  rate, regular rhythm, normal heart sounds and intact distal pulses. Exam reveals no gallop.  Pulmonary/Chest: Effort normal and breath sounds normal. No respiratory distress. She has no wheezes. She exhibits no tenderness.  Abdominal: Soft. Bowel sounds are normal. She exhibits distension. She exhibits no abdominal bruit and no mass. There is no tenderness. There is no rebound and no guarding.  Mild R upper and middle abd tenderness w/o rebound or guarding No cva tenderness  Genitourinary: No breast swelling, tenderness, discharge or bleeding.  Genitourinary Comments: Breast exam: No mass, nodules, thickening, tenderness, bulging, retraction, inflamation, nipple discharge or skin changes noted.  No axillary or clavicular LA.      Musculoskeletal: Normal range of motion. She exhibits no edema or tenderness.  Lymphadenopathy:    She has no cervical adenopathy.  Neurological: She is alert. She has normal reflexes. No cranial nerve deficit. She exhibits normal muscle tone. Coordination  normal.  Skin: Skin is warm and dry. No rash noted. No erythema. No pallor.  Solar lentigines diffusely   Psychiatric: She has a normal mood and affect.          Assessment & Plan:   Problem List Items Addressed This Visit      Other   Abdominal pain    Ongoing but somewhat improved (more fleeting now)  Rev CT scan and labs in detail  New R renal mass seen-ref for MRI to better see this        Elevated glucose level    Lab Results  Component Value Date   HGBA1C 5.4 05/28/2017   Continue to watch Diet and exercise are imp  disc imp of low glycemic diet and wt loss to prevent DM2       Right kidney mass    New  Ref for MRI      Right sided abdominal pain    Continues but improved  Unsure if kidney mass plays a role / is [place]  Rev CT Will get MRI If no cause found consider GI consult      Routine general medical examination at a health care facility - Primary    Reviewed health habits  including diet and exercise and skin cancer prevention Reviewed appropriate screening tests for age  Also reviewed health mt list, fam hx and immunization status , as well as social and family history   See HPI Labs reviewed  Enc her to keep working on healthy diet and exercise         Other Visit Diagnoses    Other specified disorders of kidney and ureter       Relevant Orders   MR Abdomen W Wo Contrast

## 2017-06-04 NOTE — Patient Instructions (Addendum)
We will order abdominal MRI   You can schedule your mammogram   For cholesterol  Avoid red meat/ fried foods/ egg yolks/ fatty breakfast meats/ butter, cheese and high fat dairy/ and shellfish   Keep eating fish   Take care of yourself  Keep exercising  Alert me if abdominal pain worsens or becomes more frequent

## 2017-06-06 NOTE — Assessment & Plan Note (Signed)
Lab Results  Component Value Date   HGBA1C 5.4 05/28/2017   Continue to watch Diet and exercise are imp  disc imp of low glycemic diet and wt loss to prevent DM2

## 2017-06-06 NOTE — Assessment & Plan Note (Signed)
Continues but improved  Unsure if kidney mass plays a role / is [place]  Rev CT Will get MRI If no cause found consider GI consult

## 2017-06-06 NOTE — Assessment & Plan Note (Signed)
Ongoing but somewhat improved (more fleeting now)  Rev CT scan and labs in detail  New R renal mass seen-ref for MRI to better see this

## 2017-06-06 NOTE — Assessment & Plan Note (Signed)
Reviewed health habits including diet and exercise and skin cancer prevention Reviewed appropriate screening tests for age  Also reviewed health mt list, fam hx and immunization status , as well as social and family history   See HPI Labs reviewed  Enc her to keep working on [employer] and exercise

## 2017-06-06 NOTE — Assessment & Plan Note (Signed)
New  Ref for MRI

## 2017-06-07 ENCOUNTER — Other Ambulatory Visit: Payer: Self-pay | Admitting: Family Medicine

## 2017-06-07 DIAGNOSIS — Z1231 Encounter for screening mammogram for malignant neoplasm of breast: Secondary | ICD-10-CM

## 2017-06-10 ENCOUNTER — Ambulatory Visit: Payer: BLUE CROSS/BLUE SHIELD | Admitting: Family Medicine

## 2017-06-10 ENCOUNTER — Encounter: Payer: Self-pay | Admitting: Family Medicine

## 2017-06-10 VITALS — BP 106/66 | HR 69 | Temp 98.3°F | Ht 64.25 in | Wt 159.5 lb

## 2017-06-10 DIAGNOSIS — J069 Acute upper respiratory infection, unspecified: Secondary | ICD-10-CM | POA: Diagnosis not present

## 2017-06-10 DIAGNOSIS — B9789 Other viral agents as the cause of diseases classified elsewhere: Secondary | ICD-10-CM | POA: Diagnosis not present

## 2017-06-10 MED ORDER — BENZONATATE 200 MG PO CAPS: 200.0000 mg | ORAL_CAPSULE | Freq: Three times a day (TID) | ORAL | 1 refills | Status: DC | PRN

## 2017-06-10 NOTE — Assessment & Plan Note (Addendum)
Day 3  Reassuring exam  Disc symptomatic care - see instructions on AVS  Will try tessalon for cough prn  Update if not starting to improve in a week or if worsening    Avs: I think you have a viral upper respiratory infection  Drink lots of fluids Rest   I like mucinex DM or robitussin DM - for cough and congestion (head and chest) Breathe steam Nasal saline spray -as long or often as you want  Warm compresses on face/sinuses  For severe nasal congestion - you can try afrin nasal spray for 2 days maximum or sudafed  For additional cough relief - tessalon pills / sent to pharmacy  Aleve or ibuprofen as needed for pain and fever (and sinus pressure)   Update if not starting to improve in a week or if worsening

## 2017-06-10 NOTE — Patient Instructions (Addendum)
I think you have a viral upper respiratory infection  Drink lots of fluids Rest   I like mucinex DM or robitussin DM - for cough and congestion (head and chest) Breathe steam Nasal saline spray -as long or often as you want  Warm compresses on face/sinuses  For severe nasal congestion - you can try afrin nasal spray for 2 days maximum or sudafed  For additional cough relief - tessalon pills / sent to pharmacy  Aleve or ibuprofen as needed for pain and fever (and sinus pressure)   Update if not starting to improve in a week or if worsening

## 2017-06-10 NOTE — Progress Notes (Signed)
Subjective:    Patient ID: Courtney Pineda, female    DOB: 1970-09-07, 47 y.o.   MRN: [id number]  HPI  Here for uri symptoms with congestion and ST and low grade temp   Temp: 98.3 F (36.8 C)   Started on Tuesday   (family sick also)  By that night- chills/tired and achey 99.4 Tmax  Stayed home from work yesterday but temp was normal  Went back to work today   [place]  Very congestion  Cough- some phlegm/ clear for the most part  Some facial pain and eye pain - clear nasal d/c also  Ears do not hurt   otc cold relief med- "cong and cough"- generic /every 4 hours  Has not taken any today   Patient Active Problem List   Diagnosis Date Noted  . Viral URI with cough 06/10/2017  . Right kidney mass 06/04/2017  . Right sided abdominal pain 05/31/2017  . Hematuria 05/31/2017  . Elevated glucose level 05/22/2017  . S/P laparoscopic hysterectomy 01/14/2017  . Abdominal pain 07/27/2016  . Frequent urination 07/27/2016  . Dyspepsia 07/27/2016  . Daily headache 06/01/2016  . Encounter for routine gynecological examination 05/23/2014  . Dyspareunia 05/23/2014  . Burning sensation of mouth 09/12/2013  . Routine general medical examination at a health care facility 01/05/2011  . KIDNEY STONE 03/27/2010  . STRESS REACTION, ACUTE, WITH EMOTIONAL DISTURBANCE 09/13/2007  . HEADACHE 09/13/2007  . NEVUS, ATYPICAL 08/20/2006   Past Medical History:  Diagnosis Date  . Anemia   . Atypical moles    abnormal moles removed from neck.  . Hyperemesis gravidarum 1999  . Kidney stones    Past Surgical History:  Procedure Laterality Date  . CHOLECYSTECTOMY  06/1994  . CYSTOSCOPY N/A 01/14/2017   Procedure: CYSTOSCOPY;  Surgeon: Malachy Mood, MD;  Location: [facility];  Service: Gynecology;  Laterality: N/A;  . KIDNEY STONE SURGERY    . LITHOTRIPSY  03/2006  . TONSILECTOMY, ADENOIDECTOMY, BILATERAL MYRINGOTOMY AND TUBES     myringotomy tubes  . TONSILLECTOMY    . TOTAL  LAPAROSCOPIC HYSTERECTOMY WITH SALPINGECTOMY Bilateral 01/14/2017   Procedure: HYSTERECTOMY TOTAL LAPAROSCOPIC WITH SALPINGECTOMY;  Surgeon: Malachy Mood, MD;  Location: [facility];  Service: Gynecology;  Laterality: Bilateral;   Social History   Tobacco Use  . Smoking status: Never Smoker  . Smokeless tobacco: Never Used  Substance Use Topics  . Alcohol use: No    Alcohol/week: 0.0 oz  . Drug use: No   Family History  Problem Relation Age of Onset  . Migraines Mother   . Cancer Paternal Aunt        breast cancer  . Breast cancer Paternal Aunt   . Cancer Maternal Grandmother        brain tumor/breast cancer  . Breast cancer Maternal Grandmother 67  . Heart disease Maternal Grandfather        heart problems   Allergies  Allergen Reactions  . Amoxicillin Anaphylaxis  . Bee Venom Anaphylaxis  . Sulfa Antibiotics Anaphylaxis  . Sulfonamide Derivatives Anaphylaxis    Lungs close up  . Penicillins Rash    Has patient had a PCN reaction causing immediate rash, facial/tongue/throat swelling, SOB or lightheadedness with hypotension: No Has patient had a PCN reaction causing severe rash involving mucus membranes or skin necrosis: No Has patient had a PCN reaction that required hospitalization: No Has patient had a PCN reaction occurring within the last 10 years: No If all of the above answers  are "NO", then may proceed with Cephalosporin use.    No current outpatient medications on file prior to visit.   No current facility-administered medications on file prior to visit.     Review of Systems  Constitutional: Positive for appetite change and fatigue. Negative for fever.  HENT: Positive for congestion, postnasal drip, rhinorrhea, sinus pressure, sneezing and sore throat. Negative for ear pain.   Eyes: Negative for pain and discharge.  Respiratory: Positive for cough. Negative for shortness of breath, wheezing and stridor.   Cardiovascular: Negative for chest pain.    Gastrointestinal: Negative for diarrhea, nausea and vomiting.  Genitourinary: Negative for frequency, hematuria and urgency.  Musculoskeletal: Negative for arthralgias and myalgias.  Skin: Negative for rash.  Neurological: Positive for headaches. Negative for dizziness, weakness and light-headedness.  Psychiatric/Behavioral: Negative for confusion and dysphoric mood.       Objective:   Physical Exam  Constitutional: She appears well-developed and well-nourished. No distress.  Well but fatigued appearing   HENT:  Head: Normocephalic and atraumatic.  Right Ear: External ear normal.  Left Ear: External ear normal.  Mouth/Throat: Oropharynx is clear and moist.  Nares are injected and congested  No sinus tenderness Clear rhinorrhea and post nasal drip   Eyes: Conjunctivae and EOM are normal. Pupils are equal, round, and reactive to light. Right eye exhibits no discharge. Left eye exhibits no discharge.  Neck: Normal range of motion. Neck supple.  Cardiovascular: Normal rate and normal heart sounds.  Pulmonary/Chest: Effort normal and breath sounds normal. No respiratory distress. She has no wheezes. She has no rales. She exhibits no tenderness.  Lymphadenopathy:    She has no cervical adenopathy.  Neurological: She is alert.  Skin: Skin is warm and dry. No rash noted.  Psychiatric: She has a normal mood and affect.          Assessment & Plan:   Problem List Items Addressed This Visit      Respiratory   Viral URI with cough    Day 3  Reassuring exam  Disc symptomatic care - see instructions on AVS  Will try tessalon for cough prn  Update if not starting to improve in a week or if worsening    Avs: I think you have a viral upper respiratory infection  Drink lots of fluids Rest   I like mucinex DM or robitussin DM - for cough and congestion (head and chest) Breathe steam Nasal saline spray -as long or often as you want  Warm compresses on face/sinuses  For severe  nasal congestion - you can try afrin nasal spray for 2 days maximum or sudafed  For additional cough relief - tessalon pills / sent to pharmacy  Aleve or ibuprofen as needed for pain and fever (and sinus pressure)   Update if not starting to improve in a week or if worsening

## 2017-06-14 ENCOUNTER — Ambulatory Visit
Admission: RE | Admit: 2017-06-14 | Discharge: 2017-06-14 | Disposition: A | Discharge status: Home / Self Care | Payer: BLUE CROSS/BLUE SHIELD | Source: Ambulatory Visit | Attending: Family Medicine | Admitting: Family Medicine

## 2017-06-14 DIAGNOSIS — N2889 Other specified disorders of kidney and ureter: Secondary | ICD-10-CM

## 2017-06-14 DIAGNOSIS — N281 Cyst of kidney, acquired: Secondary | ICD-10-CM | POA: Diagnosis not present

## 2017-06-14 MED ORDER — GADOBENATE DIMEGLUMINE 529 MG/ML IV SOLN
15.0000 mL | Freq: Once | INTRAVENOUS | Status: AC | PRN
  Administered 2017-06-14: 14 mL via INTRAVENOUS

## 2017-06-16 ENCOUNTER — Telehealth: Payer: Self-pay | Admitting: Family Medicine

## 2017-06-16 DIAGNOSIS — R109 Unspecified abdominal pain: Secondary | ICD-10-CM

## 2017-06-16 NOTE — Telephone Encounter (Signed)
Referral done Will route to [facility]  

## 2017-06-17 NOTE — Telephone Encounter (Signed)
Taking care of this, see referral notes. Charlesetta Garibaldi, RMA

## 2017-06-18 ENCOUNTER — Ambulatory Visit
Admission: RE | Admit: 2017-06-18 | Discharge: 2017-06-18 | Disposition: A | Discharge status: Home / Self Care | Payer: BLUE CROSS/BLUE SHIELD | Source: Ambulatory Visit | Attending: Family Medicine | Admitting: Family Medicine

## 2017-06-18 DIAGNOSIS — Z1231 Encounter for screening mammogram for malignant neoplasm of breast: Secondary | ICD-10-CM | POA: Diagnosis not present

## 2017-07-05 ENCOUNTER — Encounter: Payer: Self-pay | Admitting: Gastroenterology

## 2017-07-05 ENCOUNTER — Other Ambulatory Visit: Payer: Self-pay

## 2017-07-05 ENCOUNTER — Ambulatory Visit: Payer: BLUE CROSS/BLUE SHIELD | Admitting: Gastroenterology

## 2017-07-05 VITALS — BP 106/70 | HR 65 | Temp 98.4°F | Ht 64.25 in | Wt 160.2 lb

## 2017-07-05 DIAGNOSIS — R109 Unspecified abdominal pain: Secondary | ICD-10-CM | POA: Diagnosis not present

## 2017-07-05 DIAGNOSIS — R140 Abdominal distension (gaseous): Secondary | ICD-10-CM

## 2017-07-05 NOTE — Progress Notes (Signed)
Cephas Darby, MD [address]  [place]  [place], [place] [address]  Main: [phone]  Fax: [phone]    Gastroenterology Consultation  Referring Provider:     Abner Greenspan, MD Primary Care Physician:  Tower, Wynelle Fanny, MD Primary Gastroenterologist:  Dr. Cephas Darby Reason for Consultation:     Right-sided abdominal pain        HPI:   Courtney Pineda is a 47 y.o. female referred by Dr. Glori Bickers, Wynelle Fanny, MD  for consultation & management of chronic right-sided abdominal pain associated with bloating. Patient had a hysterectomy in October 2018. Patient reports that she has been experiencing mild to moderate, intermittent, on and off right-sided abdominal pain predominantly in the mid abdomen associated with significant bloating. She had 1 episode of intense pain that woke her up from sleep. She takes Tylenol as needed for the pain. She had workup done for the abdominal pain including CT A/P which revealed 1.4cm lesion in the right kidney, subsequently underwent MRI, confirmed as renal cyst. There was no evidence of pancreatic, hepatobiliary pathology. She denies nausea, vomiting. She has been going through moderate stress with her 2 teenagers. She does not smoke or drink alcohol. She denies constipation, diarrhea, rectal bleeding.  She denies weight loss, loss of appetite.  NSAIDs: none  Antiplts/Anticoagulants/Anti thrombotics: none  GI Procedures: none She had cholecystectomy 1994  Past Medical History:  Diagnosis Date  . Anemia   . Atypical moles    abnormal moles removed from neck.  . Hyperemesis gravidarum 1999  . Kidney stones     Past Surgical History:  Procedure Laterality Date  . CHOLECYSTECTOMY  06/1994  . CYSTOSCOPY N/A 01/14/2017   Procedure: CYSTOSCOPY;  Surgeon: Malachy Mood, MD;  Location: [facility];  Service: Gynecology;  Laterality: N/A;  . KIDNEY STONE SURGERY    . LITHOTRIPSY  03/2006  . TONSILECTOMY, ADENOIDECTOMY, BILATERAL  MYRINGOTOMY AND TUBES     myringotomy tubes  . TONSILLECTOMY    . TOTAL LAPAROSCOPIC HYSTERECTOMY WITH SALPINGECTOMY Bilateral 01/14/2017   Procedure: HYSTERECTOMY TOTAL LAPAROSCOPIC WITH SALPINGECTOMY;  Surgeon: Malachy Mood, MD;  Location: [facility];  Service: Gynecology;  Laterality: Bilateral;    No current outpatient medications on file.  Family History  Problem Relation Age of Onset  . Migraines Mother   . Cancer Paternal Aunt        breast cancer  . Breast cancer Paternal Aunt   . Cancer Maternal Grandmother        brain tumor/breast cancer  . Breast cancer Maternal Grandmother 2  . Heart disease Maternal Grandfather        heart problems     Social History   Tobacco Use  . Smoking status: Never Smoker  . Smokeless tobacco: Never Used  Substance Use Topics  . Alcohol use: No    Alcohol/week: 0.0 oz  . Drug use: No    Allergies as of 07/05/2017 - Review Complete 07/05/2017  Allergen Reaction Noted  . Amoxicillin Anaphylaxis 10/18/2014  . Bee venom Anaphylaxis 01/07/2017  . Sulfa antibiotics Anaphylaxis 10/18/2014  . Sulfonamide derivatives Anaphylaxis 08/20/2006  . Penicillins Rash 01/07/2017    Review of Systems:    All systems reviewed and negative except where noted in HPI.   Physical Exam:  BP 106/70   Pulse 65   Temp 98.4 F (36.9 C) (Oral)   Ht 5' 4.25" (1.632 m)   Wt 160 lb 3.2 oz (72.7 kg)   LMP 01/08/2017  BMI 27.28 kg/m  Patient's last menstrual period was 01/08/2017.  General:   Alert,  Well-developed, well-nourished, pleasant and cooperative in NAD Head:  Normocephalic and atraumatic. Eyes:  Sclera clear, no icterus.   Conjunctiva pink. Ears:  Normal auditory acuity. Nose:  No deformity, discharge, or lesions. Mouth:  No deformity or lesions,oropharynx pink & moist. Neck:  Supple; no masses or thyromegaly. Lungs:  Respirations even and unlabored.  Clear throughout to auscultation.   No wheezes, crackles, or rhonchi. No acute  distress. Heart:  Regular rate and rhythm; no murmurs, clicks, rubs, or gallops. Abdomen:  Normal bowel sounds. Soft, mild right-sided tenderness predominantly in the right mid quadrant and distended, tympanic on percussion without masses, hepatosplenomegaly or hernias noted.  No guarding or rebound tenderness.   Rectal: Not performed Msk:  Symmetrical without gross deformities. Good, equal movement & strength bilaterally. Pulses:  Normal pulses noted. Extremities:  No clubbing or edema.  No cyanosis. Neurologic:  Alert and oriented x3;  grossly normal neurologically. Skin:  Intact without significant lesions or rashes. No jaundice. Lymph Nodes:  No significant cervical adenopathy. Psych:  Alert and cooperative. Normal mood and affect.  Imaging Studies: reviewed  Assessment and Plan:   Courtney Pineda is a 47 y.o. female with history of hysterectomy in 01/2017, presents with chronic right-sided abdominal pain associated with bloating, and no other constitutional symptoms. Differentials include functional dyspepsia or peptic ulcer disease or colonic neoplasm  - trial of FD-guard - Performed EGD and colonoscopy  I have discussed alternative options, risks & benefits,  which include, but are not limited to, bleeding, infection, perforation,respiratory complication & drug reaction.  The patient agrees with this plan & written consent will be obtained.     Follow up in 2 months   Cephas Darby, MD

## 2017-07-07 ENCOUNTER — Other Ambulatory Visit: Payer: Self-pay

## 2017-07-07 DIAGNOSIS — R109 Unspecified abdominal pain: Secondary | ICD-10-CM

## 2017-07-12 ENCOUNTER — Ambulatory Visit: Payer: BLUE CROSS/BLUE SHIELD | Admitting: Certified Registered Nurse Anesthetist

## 2017-07-12 ENCOUNTER — Other Ambulatory Visit: Payer: Self-pay

## 2017-07-12 ENCOUNTER — Encounter
Admission: RE | Disposition: A | Discharge status: Home / Self Care | Payer: Self-pay | Source: Ambulatory Visit | Attending: Gastroenterology

## 2017-07-12 ENCOUNTER — Ambulatory Visit
Admission: RE | Admit: 2017-07-12 | Discharge: 2017-07-12 | Disposition: A | Discharge status: Home / Self Care | Payer: BLUE CROSS/BLUE SHIELD | Source: Ambulatory Visit | Attending: Gastroenterology | Admitting: Gastroenterology

## 2017-07-12 DIAGNOSIS — Z91030 Bee allergy status: Secondary | ICD-10-CM | POA: Diagnosis not present

## 2017-07-12 DIAGNOSIS — Z87442 Personal history of urinary calculi: Secondary | ICD-10-CM | POA: Diagnosis not present

## 2017-07-12 DIAGNOSIS — Z9049 Acquired absence of other specified parts of digestive tract: Secondary | ICD-10-CM | POA: Diagnosis not present

## 2017-07-12 DIAGNOSIS — K644 Residual hemorrhoidal skin tags: Secondary | ICD-10-CM | POA: Insufficient documentation

## 2017-07-12 DIAGNOSIS — K2970 Gastritis, unspecified, without bleeding: Secondary | ICD-10-CM | POA: Insufficient documentation

## 2017-07-12 DIAGNOSIS — K3189 Other diseases of stomach and duodenum: Secondary | ICD-10-CM

## 2017-07-12 DIAGNOSIS — R140 Abdominal distension (gaseous): Secondary | ICD-10-CM

## 2017-07-12 DIAGNOSIS — Z820 Family history of epilepsy and other diseases of the nervous system: Secondary | ICD-10-CM | POA: Diagnosis not present

## 2017-07-12 DIAGNOSIS — D649 Anemia, unspecified: Secondary | ICD-10-CM | POA: Insufficient documentation

## 2017-07-12 DIAGNOSIS — K209 Esophagitis, unspecified: Secondary | ICD-10-CM | POA: Diagnosis not present

## 2017-07-12 DIAGNOSIS — Z882 Allergy status to sulfonamides status: Secondary | ICD-10-CM | POA: Insufficient documentation

## 2017-07-12 DIAGNOSIS — K210 Gastro-esophageal reflux disease with esophagitis: Secondary | ICD-10-CM

## 2017-07-12 DIAGNOSIS — Z808 Family history of malignant neoplasm of other organs or systems: Secondary | ICD-10-CM | POA: Insufficient documentation

## 2017-07-12 DIAGNOSIS — Z90710 Acquired absence of both cervix and uterus: Secondary | ICD-10-CM | POA: Insufficient documentation

## 2017-07-12 DIAGNOSIS — R109 Unspecified abdominal pain: Secondary | ICD-10-CM | POA: Diagnosis not present

## 2017-07-12 DIAGNOSIS — Z803 Family history of malignant neoplasm of breast: Secondary | ICD-10-CM | POA: Diagnosis not present

## 2017-07-12 DIAGNOSIS — Z880 Allergy status to penicillin: Secondary | ICD-10-CM | POA: Insufficient documentation

## 2017-07-12 DIAGNOSIS — R1011 Right upper quadrant pain: Secondary | ICD-10-CM | POA: Diagnosis not present

## 2017-07-12 DIAGNOSIS — K5289 Other specified noninfective gastroenteritis and colitis: Secondary | ICD-10-CM | POA: Diagnosis not present

## 2017-07-12 HISTORY — PX: ESOPHAGOGASTRODUODENOSCOPY (EGD) WITH PROPOFOL: SHX5813

## 2017-07-12 HISTORY — PX: COLONOSCOPY WITH PROPOFOL: SHX5780

## 2017-07-12 HISTORY — DX: Personal history of urinary calculi: Z87.442

## 2017-07-12 SURGERY — COLONOSCOPY WITH PROPOFOL
Anesthesia: General

## 2017-07-12 MED ORDER — SODIUM CHLORIDE 0.9 % IV SOLN
INTRAVENOUS | Status: DC
  Administered 2017-07-12: 08:00:00 via INTRAVENOUS

## 2017-07-12 MED ORDER — MIDAZOLAM HCL 2 MG/2ML IJ SOLN
INTRAMUSCULAR | Status: DC | PRN
  Administered 2017-07-12: 2 mg via INTRAVENOUS

## 2017-07-12 MED ORDER — MIDAZOLAM HCL 2 MG/2ML IJ SOLN
INTRAMUSCULAR | Status: AC
  Filled 2017-07-12: qty 2

## 2017-07-12 MED ORDER — LIDOCAINE HCL (PF) 2 % IJ SOLN
INTRAMUSCULAR | Status: AC
  Filled 2017-07-12: qty 10

## 2017-07-12 MED ORDER — OMEPRAZOLE 40 MG PO CPDR: 40.0000 mg | DELAYED_RELEASE_CAPSULE | Freq: Two times a day (BID) | ORAL | 0 refills | Status: DC

## 2017-07-12 MED ORDER — PROPOFOL 500 MG/50ML IV EMUL
INTRAVENOUS | Status: DC | PRN
  Administered 2017-07-12: 150 ug/kg/min via INTRAVENOUS

## 2017-07-12 MED ORDER — LIDOCAINE HCL (CARDIAC) 20 MG/ML IV SOLN
INTRAVENOUS | Status: DC | PRN
  Administered 2017-07-12: 100 mg via INTRATRACHEAL

## 2017-07-12 MED ORDER — PROPOFOL 10 MG/ML IV BOLUS
INTRAVENOUS | Status: DC | PRN
  Administered 2017-07-12 (×3): 20 mg via INTRAVENOUS
  Administered 2017-07-12: 50 mg via INTRAVENOUS

## 2017-07-12 MED ORDER — PROPOFOL 500 MG/50ML IV EMUL
INTRAVENOUS | Status: AC
  Filled 2017-07-12: qty 50

## 2017-07-12 NOTE — Anesthesia Procedure Notes (Signed)
Performed by: Shanayah Kaffenberger, CRNA Pre-anesthesia Checklist: Patient identified, Suction available, Emergency Drugs available, Patient being monitored and Timeout performed Patient Re-evaluated:Patient Re-evaluated prior to induction Oxygen Delivery Method: Nasal cannula Induction Type: IV induction       

## 2017-07-12 NOTE — H&P (Signed)
Cephas Darby, MD [address]  [place]  [place], [place] [address]  Main: [phone]  Fax: [phone] Pager: [phone]  Primary Care Physician:  Tower, Wynelle Fanny, MD Primary Gastroenterologist:  Dr. Cephas Darby  Pre-Procedure History & Physical: HPI:  Courtney Pineda is a 47 y.o. female is here for an endoscopy and colonoscopy.   Past Medical History:  Diagnosis Date  . Anemia   . Atypical moles    abnormal moles removed from neck.  . History of kidney stones   . Hyperemesis gravidarum 1999    Past Surgical History:  Procedure Laterality Date  . CHOLECYSTECTOMY  06/1994  . CYSTOSCOPY N/A 01/14/2017   Procedure: CYSTOSCOPY;  Surgeon: Malachy Mood, MD;  Location: [facility];  Service: Gynecology;  Laterality: N/A;  . KIDNEY STONE SURGERY    . LITHOTRIPSY  03/2006  . TONSILECTOMY, ADENOIDECTOMY, BILATERAL MYRINGOTOMY AND TUBES     myringotomy tubes  . TONSILLECTOMY    . TOTAL LAPAROSCOPIC HYSTERECTOMY WITH SALPINGECTOMY Bilateral 01/14/2017   Procedure: HYSTERECTOMY TOTAL LAPAROSCOPIC WITH SALPINGECTOMY;  Surgeon: Malachy Mood, MD;  Location: [facility];  Service: Gynecology;  Laterality: Bilateral;    Prior to Admission medications   Not on File    Allergies as of 07/07/2017 - Review Complete 07/05/2017  Allergen Reaction Noted  . Amoxicillin Anaphylaxis 10/18/2014  . Bee venom Anaphylaxis 01/07/2017  . Sulfa antibiotics Anaphylaxis 10/18/2014  . Sulfonamide derivatives Anaphylaxis 08/20/2006  . Penicillins Rash 01/07/2017    Family History  Problem Relation Age of Onset  . Migraines Mother   . Cancer Paternal Aunt        breast cancer  . Breast cancer Paternal Aunt   . Cancer Maternal Grandmother        brain tumor/breast cancer  . Breast cancer Maternal Grandmother 59  . Heart disease Maternal Grandfather        heart problems    Social History   Socioeconomic History  . Marital status: Married    Spouse name: Not on file    . Number of children: Not on file  . Years of education: Not on file  . Highest education level: Not on file  Occupational History  . Not on file  Social Needs  . Financial resource strain: Not on file  . Food insecurity:    Worry: Not on file    Inability: Not on file  . Transportation needs:    Medical: Not on file    Non-medical: Not on file  Tobacco Use  . Smoking status: Never Smoker  . Smokeless tobacco: Never Used  Substance and Sexual Activity  . Alcohol use: No    Alcohol/week: 0.0 oz  . Drug use: No  . Sexual activity: Yes    Birth control/protection: None  Lifestyle  . Physical activity:    Days per week: 3 days    Minutes per session: 30 min  . Stress: Only a little  Relationships  . Social connections:    Talks on phone: More than three times a week    Gets together: More than three times a week    Attends religious service: More than 4 times per year    Active member of club or organization: No    Attends meetings of clubs or organizations: Never    Relationship status: Married  . Intimate partner violence:    Fear of current or ex partner: No    Emotionally abused: No    Physically abused: No  Forced sexual activity: No  Other Topics Concern  . Not on file  Social History Narrative  . Not on file    Review of Systems: See HPI, otherwise negative ROS  Physical Exam: BP 117/74   Pulse 65   Temp (!) 97.4 F (36.3 C)   Resp 18   Ht 5\' 4"  (1.626 m)   Wt 159 lb (72.1 kg)   LMP 01/08/2017   SpO2 98%   BMI 27.29 kg/m  General:   Alert,  pleasant and cooperative in NAD Head:  Normocephalic and atraumatic. Neck:  Supple; no masses or thyromegaly. Lungs:  Clear throughout to auscultation.    Heart:  Regular rate and rhythm. Abdomen:  Soft, nontender and nondistended. Normal bowel sounds, without guarding, and without rebound.   Neurologic:  Alert and  oriented x4;  grossly normal neurologically.  Impression/Plan: Courtney Pineda is  here for an endoscopy and colonoscopy to be performed for right sided abdominal pain and bloating  Risks, benefits, limitations, and alternatives regarding  endoscopy and colonoscopy have been reviewed with the patient.  Questions have been answered.  All parties agreeable.   Sherri Sear, MD  07/12/2017, 8:08 AM

## 2017-07-12 NOTE — Anesthesia Postprocedure Evaluation (Signed)
Anesthesia Post Note  Patient: Charleigh Correnti Portela  Procedure(s) Performed: COLONOSCOPY WITH PROPOFOL (N/A ) ESOPHAGOGASTRODUODENOSCOPY (EGD) WITH PROPOFOL (N/A )  Patient location during evaluation: PACU Anesthesia Type: General Level of consciousness: awake and alert and oriented Pain management: pain level controlled Vital Signs Assessment: post-procedure vital signs reviewed and stable Respiratory status: spontaneous breathing Cardiovascular status: blood pressure returned to baseline Anesthetic complications: no     Last Vitals:  Vitals:   07/12/17 1025 07/12/17 1035  BP: 114/79   Pulse: (!) 55 62  Resp:  18  Temp:    SpO2: 100% 100%    Last Pain:  Vitals:   07/12/17 1035  PainSc: 0-No pain                 Niomie Englert

## 2017-07-12 NOTE — Op Note (Signed)
[facility] Patient Name: Courtney Pineda Procedure Date: 07/12/2017 8:47 AM MRN: [id number] Account #: [id number] Date of Birth: [birth year] Admit Type: Outpatient Age: 47 Room: [facility] ENDO ROOM 2 Gender: Female Note Status: Finalized Procedure:            Upper GI endoscopy Indications:          Abdominal pain in the right upper quadrant, Abdominal                        bloating Providers:            Lin Landsman MD, MD Referring MD:         Wynelle Fanny. Tower (Referring MD) Medicines:            Monitored Anesthesia Care Complications:        No immediate complications. Estimated blood loss: None. Procedure:            Pre-Anesthesia Assessment:                       - Prior to the procedure, a History and Physical was                        performed, and patient medications and allergies were                        reviewed. The patient is competent. The risks and                        benefits of the procedure and the sedation options and                        risks were discussed with the patient. All questions                        were answered and informed consent was obtained.                        Patient identification and proposed procedure were                        verified by the physician, the nurse, the                        anesthesiologist, the anesthetist and the technician in                        the pre-procedure area in the procedure room in the                        endoscopy suite. Mental Status Examination: alert and                        oriented. Airway Examination: normal oropharyngeal                        airway and neck mobility. Respiratory Examination:                        clear to auscultation. CV Examination: normal.  Prophylactic Antibiotics: The patient does not require                        prophylactic antibiotics. Prior Anticoagulants: The                        patient  has taken no previous anticoagulant or                        antiplatelet agents. ASA Grade Assessment: II - A                        patient with mild systemic disease. After reviewing the                        risks and benefits, the patient was deemed in                        satisfactory condition to undergo the procedure. The                        anesthesia plan was to use monitored anesthesia care                        (MAC). Immediately prior to administration of                        medications, the patient was re-assessed for adequacy                        to receive sedatives. The heart rate, respiratory rate,                        oxygen saturations, blood pressure, adequacy of                        pulmonary ventilation, and response to care were                        monitored throughout the procedure. The physical status                        of the patient was re-assessed after the procedure.                       After obtaining informed consent, the endoscope was                        passed under direct vision. Throughout the procedure,                        the patient's blood pressure, pulse, and oxygen                        saturations were monitored continuously. The Endoscope                        was introduced through the mouth, and advanced to the  second part of duodenum. The upper GI endoscopy was                        accomplished without difficulty. The patient tolerated                        the procedure well. Findings:      The duodenal bulb and second portion of the duodenum were normal.       Biopsies for histology were taken with a cold forceps for evaluation of       celiac disease.      A few dispersed, diminutive non-bleeding erosions were found in the       gastric antrum. There were stigmata of recent bleeding.      The cardia, gastric fundus, gastric body, incisura and pylorus were       normal. Biopsies  were taken with a cold forceps for Helicobacter pylori       testing.      LA Grade B (one or more mucosal breaks greater than 5 mm, not extending       between the tops of two mucosal folds) esophagitis with no bleeding was       found in the lower third of the esophagus.      The upper third of the esophagus and middle third of the esophagus were       normal. Impression:           - Normal duodenal bulb and second portion of the                        duodenum. Biopsied.                       - Non-bleeding erosive gastropathy.                       - Normal cardia, gastric fundus, gastric body, incisura                        and pylorus. Biopsied.                       - LA Grade B reflux esophagitis.                       - Normal upper third of esophagus and middle third of                        esophagus. Recommendation:       - Await pathology results.                       - Discharge patient to home.                       - Resume previous diet today.                       - Continue present medications.                       - Use Prilosec (omeprazole) 40 mg PO BID.                       -  Follow an antireflux regimen. Procedure Code(s):    --- Professional ---                       [phone], Esophagogastroduodenoscopy, flexible, transoral;                        with biopsy, single or multiple Diagnosis Code(s):    --- Professional ---                       K31.89, Other diseases of stomach and duodenum                       K21.0, Gastro-esophageal reflux disease with esophagitis                       R10.11, Right upper quadrant pain                       R14.0, Abdominal distension (gaseous) CPT copyright 2016 American [employer] Association. All rights reserved. The codes documented in this report are preliminary and upon coder review may  be revised to meet current compliance requirements. Dr. Ulyess Mort Lin Landsman MD, MD 07/12/2017 9:10:10 AM This report has  been signed electronically. Number of Addenda: 0 Note Initiated On: 07/12/2017 8:47 AM      [facility]

## 2017-07-12 NOTE — Anesthesia Post-op Follow-up Note (Signed)
Anesthesia QCDR form completed.        

## 2017-07-12 NOTE — Anesthesia Preprocedure Evaluation (Signed)
Anesthesia Evaluation  Patient identified by MRN, date of birth, ID band Patient awake    Reviewed: Allergy & Precautions, NPO status , Patient's Chart, lab work & pertinent test results  Airway Mallampati: III  TM Distance: <3 FB     Dental  (+) Teeth Intact   Pulmonary neg pulmonary ROS,    Pulmonary exam normal        Cardiovascular negative cardio ROS Normal cardiovascular exam     Neuro/Psych  Headaches, negative psych ROS   GI/Hepatic negative GI ROS, Neg liver ROS,   Endo/Other  negative endocrine ROS  Renal/GU stones  Female GU complaint     Musculoskeletal negative musculoskeletal ROS (+)   Abdominal Normal abdominal exam  (+)   Peds negative pediatric ROS (+)  Hematology  (+) anemia ,   Anesthesia Other Findings   Reproductive/Obstetrics                             Anesthesia Physical  Anesthesia Plan  ASA: II  Anesthesia Plan:    Post-op Pain Management:    Induction: Intravenous  PONV Risk Score and Plan:   Airway Management Planned: Nasal Cannula  Additional Equipment:   Intra-op Plan:   Post-operative Plan:   Informed Consent: I have reviewed the patients History and Physical, chart, labs and discussed the procedure including the risks, benefits and alternatives for the proposed anesthesia with the patient or authorized representative who has indicated his/her understanding and acceptance.   Dental advisory given  Plan Discussed with: CRNA and Surgeon  Anesthesia Plan Comments:         Anesthesia Quick Evaluation

## 2017-07-12 NOTE — Op Note (Signed)
[facility] Gastroenterology Patient Name: Courtney Pineda Procedure Date: 07/12/2017 8:48 AM MRN: [id number] Account #: [id number] Date of Birth: 06-17-1970 Admit Type: Outpatient Age: 47 Room: [facility] ENDO ROOM 2 Gender: Female Note Status: Finalized Procedure:            Colonoscopy Indications:          Abdominal pain in the right upper quadrant Providers:            Lin Landsman MD, MD Referring MD:         Wynelle Fanny. Tower (Referring MD) Medicines:            Monitored Anesthesia Care Complications:        No immediate complications. Estimated blood loss: None. Procedure:            Pre-Anesthesia Assessment:                       - Prior to the procedure, a History and Physical was                        performed, and patient medications and allergies were                        reviewed. The patient is competent. The risks and                        benefits of the procedure and the sedation options and                        risks were discussed with the patient. All questions                        were answered and informed consent was obtained.                        Patient identification and proposed procedure were                        verified by the physician, the nurse, the                        anesthesiologist, the anesthetist and the technician in                        the pre-procedure area in the procedure room in the                        endoscopy suite. Mental Status Examination: alert and                        oriented. Airway Examination: normal oropharyngeal                        airway and neck mobility. Respiratory Examination:                        clear to auscultation. CV Examination: normal.                        Prophylactic Antibiotics: The patient does not require  prophylactic antibiotics. Prior Anticoagulants: The                        patient has taken no previous anticoagulant or             antiplatelet agents. ASA Grade Assessment: II - A                        patient with mild systemic disease. After reviewing the                        risks and benefits, the patient was deemed in                        satisfactory condition to undergo the procedure. The                        anesthesia plan was to use monitored anesthesia care                        (MAC). Immediately prior to administration of                        medications, the patient was re-assessed for adequacy                        to receive sedatives. The heart rate, respiratory rate,                        oxygen saturations, blood pressure, adequacy of                        pulmonary ventilation, and response to care were                        monitored throughout the procedure. The physical status                        of the patient was re-assessed after the procedure.                       After obtaining informed consent, the colonoscope was                        passed under direct vision. Throughout the procedure,                        the patient's blood pressure, pulse, and oxygen                        saturations were monitored continuously. The                        Colonoscope was introduced through the anus and                        advanced to the 10 cm into the ileum. The colonoscopy                        was performed without difficulty. The patient tolerated  the procedure well. The quality of the bowel                        preparation was evaluated using the BBPS [facility] Bowel                        Preparation Scale) with scores of: Right Colon = 3,                        Transverse Colon = 3 and Left Colon = 3 (entire mucosa                        seen well with no residual staining, Rosas fragments of                        stool or opaque liquid). The total BBPS score equals 9. Findings:      The perianal exam findings include non-thrombosed  external hemorrhoids       and skin tags.      The terminal ileum appeared normal.      External hemorrhoids were found during retroflexion. The hemorrhoids       were medium-sized.      Normal mucosa was found in the entire colon. Impression:           - Non-thrombosed external hemorrhoids and perianal skin                        tags found on perianal exam.                       - The examined portion of the ileum was normal.                       - External hemorrhoids.                       - Normal mucosa in the entire examined colon.                       - No specimens collected. Recommendation:       - Discharge patient to home.                       - Resume previous diet today.                       - Continue present medications.                       - Await pathology results. Procedure Code(s):    --- Professional ---                       [phone], Colonoscopy, flexible; diagnostic, including                        collection of specimen(s) by brushing or washing, when                        performed (separate procedure) Diagnosis Code(s):    --- Professional ---  K64.4, Residual hemorrhoidal skin tags                       R10.11, Right upper quadrant pain CPT copyright 2016 American Medical Association. All rights reserved. The codes documented in this report are preliminary and upon coder review may  be revised to meet current compliance requirements. Dr. Ulyess Mort Lin Landsman MD, MD 07/12/2017 9:40:30 AM This report has been signed electronically. Number of Addenda: 0 Note Initiated On: 07/12/2017 8:48 AM Scope Withdrawal Time: 0 hours 9 minutes 41 seconds  Total Procedure Duration: 0 hours 16 minutes 10 seconds       [facility]

## 2017-07-12 NOTE — Transfer of Care (Signed)
Immediate Anesthesia Transfer of Care Note  Patient: Courtney Pineda  Procedure(s) Performed: COLONOSCOPY WITH PROPOFOL (N/A ) ESOPHAGOGASTRODUODENOSCOPY (EGD) WITH PROPOFOL (N/A )  Patient Location: PACU  Anesthesia Type:General  Level of Consciousness: sedated  Airway & Oxygen Therapy: Patient Spontanous Breathing and Patient connected to nasal cannula oxygen  Post-op Assessment: Report given to RN and Post -op Vital signs reviewed and stable  Post vital signs: Reviewed and stable  Last Vitals:  Vitals Value Taken Time  BP    Temp    Pulse 70 07/12/2017  9:44 AM  Resp 19 07/12/2017  9:44 AM  SpO2 96 % 07/12/2017  9:44 AM  Vitals shown include unvalidated device data.  Last Pain:  Vitals:   07/12/17 0756  PainSc: 3          Complications: No apparent anesthesia complications

## 2017-07-14 ENCOUNTER — Telehealth: Payer: Self-pay | Admitting: Gastroenterology

## 2017-07-14 LAB — SURGICAL PATHOLOGY

## 2017-07-14 NOTE — Telephone Encounter (Signed)
Attempted to call pt to  Schedule FU apt per Dr. Marius Ditch no vm set up

## 2017-08-11 ENCOUNTER — Ambulatory Visit: Payer: BLUE CROSS/BLUE SHIELD | Admitting: Gastroenterology

## 2017-08-11 ENCOUNTER — Encounter: Payer: Self-pay | Admitting: Gastroenterology

## 2017-08-11 VITALS — BP 129/77 | HR 69 | Wt 159.0 lb

## 2017-08-11 DIAGNOSIS — R140 Abdominal distension (gaseous): Secondary | ICD-10-CM

## 2017-08-11 DIAGNOSIS — R1011 Right upper quadrant pain: Secondary | ICD-10-CM | POA: Diagnosis not present

## 2017-08-11 DIAGNOSIS — G8929 Other chronic pain: Secondary | ICD-10-CM

## 2017-08-11 NOTE — Progress Notes (Signed)
Cephas Darby, MD [address]  [place]  [place], [place] [address]  Main: [phone]  Fax: [phone]    Gastroenterology Consultation  Referring Provider:     Abner Greenspan, MD Primary Care Physician:  Tower, Wynelle Fanny, MD Primary Gastroenterologist:  Dr. Cephas Darby Reason for Consultation:     Right-sided abdominal pain and bloating        HPI:   Courtney Pineda is a 47 y.o. female referred by Dr. Glori Bickers, Wynelle Fanny, MD  for consultation & management of chronic right-sided abdominal pain associated with bloating. Patient had a hysterectomy in October 2018. Patient reports that she has been experiencing mild to moderate, intermittent, on and off right-sided abdominal pain predominantly in the mid abdomen associated with significant bloating. She had 1 episode of intense pain that woke her up from sleep. She takes Tylenol as needed for the pain. She had workup done for the abdominal pain including CT A/P which revealed 1.4cm lesion in the right kidney, subsequently underwent MRI, confirmed as renal cyst. There was no evidence of pancreatic, hepatobiliary pathology. She denies nausea, vomiting. She has been going through moderate stress with her 2 teenagers. She does not smoke or drink alcohol. She denies constipation, diarrhea, rectal bleeding.  She denies weight loss, loss of appetite.  Follow-up visit 08/11/2017 Since last visit, patient underwent EGD and colonoscopy. There is no evidence of celiac disease or H. Pylori infection. Colonoscopy revealed normal terminal ileum and colonic mucosa. However, right colon biopsies revealed mild to moderate active colitis with no evidence of chronicity. Patient continues to have intermittent mild right upper quadrant discomfort associated with significant bloating.  She lost about 6 pounds as she has been exercising regularly, about 30 minutes daily at work. She does consume one bottle of Coke daily. She also noticed that she had  some red meat yesterday prior to the onset of bloating and right upper quadrant pain. she otherwise denies any other GI symptoms. She tried FD guard which did not provide any relief. She is on omeprazole 40 mg twice daily without any improvement in her symptoms.  NSAIDs: none  Antiplts/Anticoagulants/Anti thrombotics: none  GI Procedures: none She had cholecystectomy 1994  Past Medical History:  Diagnosis Date  . Anemia   . Atypical moles    abnormal moles removed from neck.  . History of kidney stones   . Hyperemesis gravidarum 1999    Past Surgical History:  Procedure Laterality Date  . CHOLECYSTECTOMY  06/1994  . COLONOSCOPY WITH PROPOFOL N/A 07/12/2017   Procedure: COLONOSCOPY WITH PROPOFOL;  Surgeon: Lin Landsman, MD;  Location: [facility] ENDOSCOPY;  Service: Gastroenterology;  Laterality: N/A;  . CYSTOSCOPY N/A 01/14/2017   Procedure: CYSTOSCOPY;  Surgeon: Malachy Mood, MD;  Location: [facility] ORS;  Service: Gynecology;  Laterality: N/A;  . ESOPHAGOGASTRODUODENOSCOPY (EGD) WITH PROPOFOL N/A 07/12/2017   Procedure: ESOPHAGOGASTRODUODENOSCOPY (EGD) WITH PROPOFOL;  Surgeon: Lin Landsman, MD;  Location: [facility] ENDOSCOPY;  Service: Gastroenterology;  Laterality: N/A;  . KIDNEY STONE SURGERY    . LITHOTRIPSY  03/2006  . TONSILECTOMY, ADENOIDECTOMY, BILATERAL MYRINGOTOMY AND TUBES     myringotomy tubes  . TONSILLECTOMY    . TOTAL LAPAROSCOPIC HYSTERECTOMY WITH SALPINGECTOMY Bilateral 01/14/2017   Procedure: HYSTERECTOMY TOTAL LAPAROSCOPIC WITH SALPINGECTOMY;  Surgeon: Malachy Mood, MD;  Location: [facility] ORS;  Service: Gynecology;  Laterality: Bilateral;     Current Outpatient Medications:  .  omeprazole (PRILOSEC) 40 MG capsule, Take 1 capsule (40 mg total) by  mouth 2 (two) times daily before a meal., Disp: 180 capsule, Rfl: 0  Family History  Problem Relation Age of Onset  . Migraines Mother   . Cancer Paternal Aunt        breast cancer  . Breast cancer Paternal Aunt    . Cancer Maternal Grandmother        brain tumor/breast cancer  . Breast cancer Maternal Grandmother 11  . Heart disease Maternal Grandfather        heart problems     Social History   Tobacco Use  . Smoking status: Never Smoker  . Smokeless tobacco: Never Used  Substance Use Topics  . Alcohol use: No    Alcohol/week: 0.0 oz  . Drug use: No    Allergies as of 08/11/2017 - Review Complete 08/11/2017  Allergen Reaction Noted  . Amoxicillin Anaphylaxis 10/18/2014  . Bee venom Anaphylaxis 01/07/2017  . Sulfa antibiotics Anaphylaxis 10/18/2014  . Sulfonamide derivatives Anaphylaxis 08/20/2006  . Penicillins Rash 01/07/2017    Review of Systems:    All systems reviewed and negative except where noted in HPI.   Physical Exam:  BP 129/77   Pulse 69   Wt 159 lb (72.1 kg)   LMP 01/08/2017   BMI 27.29 kg/m  Patient's last menstrual period was 01/08/2017.  General:   Alert,  Well-developed, well-nourished, pleasant and cooperative in NAD Head:  Normocephalic and atraumatic. Eyes:  Sclera clear, no icterus.   Conjunctiva pink. Ears:  Normal auditory acuity. Nose:  No deformity, discharge, or lesions. Mouth:  No deformity or lesions,oropharynx pink & moist. Neck:  Supple; no masses or thyromegaly. Lungs:  Respirations even and unlabored.  Clear throughout to auscultation.   No wheezes, crackles, or rhonchi. No acute distress. Heart:  Regular rate and rhythm; no murmurs, clicks, rubs, or gallops. Abdomen:  Normal bowel sounds. Soft, mild right-sided tenderness predominantly in the right mid quadrant and distended, tympanic on percussion without masses, hepatosplenomegaly or hernias noted.  No guarding or rebound tenderness.   Rectal: Not performed Msk:  Symmetrical without gross deformities. Good, equal movement & strength bilaterally. Pulses:  Normal pulses noted. Extremities:  No clubbing or edema.  No cyanosis. Neurologic:  Alert and oriented x3;  grossly normal  neurologically. Skin:  Intact without significant lesions or rashes. No jaundice. Lymph Nodes:  No significant cervical adenopathy. Psych:  Alert and cooperative. Normal mood and affect.  Imaging Studies: reviewed  Assessment and Plan:   Courtney Pineda is a 47 y.o. Caucasian female with history of hysterectomy in 01/2017, presents with chronic intermittent right-sided abdominal pain associated with bloating, and no other constitutional symptoms. She is status post cholecystectomy. Mild active colitis in the right colon on random biopsies is nonspecific finding. The pathology does not confirm inflammatory bowel disease  - I discussed with her about dietary modifications, avoid processed meats, candy foods, carbonated beverages and red meat at least for 4 weeks to see if she notices any improvement in her symptoms - I also discussed with her about empiric trial of antibiotics for possible bacterial overgrowth  - I also discussed about trial of Apriso if above therapies fail  Follow up in 1 month   Cephas Darby, MD

## 2017-09-27 ENCOUNTER — Ambulatory Visit: Payer: BLUE CROSS/BLUE SHIELD | Admitting: Gastroenterology

## 2017-09-27 ENCOUNTER — Encounter: Payer: Self-pay | Admitting: Gastroenterology

## 2017-09-27 VITALS — BP 112/69 | HR 63 | Resp 16 | Ht 64.0 in | Wt 159.2 lb

## 2017-09-27 DIAGNOSIS — K523 Indeterminate colitis: Secondary | ICD-10-CM

## 2017-09-27 MED ORDER — MESALAMINE ER 0.375 G PO CP24: 1.5000 g | ORAL_CAPSULE | Freq: Every day | ORAL | 1 refills | Status: DC

## 2017-09-27 NOTE — Progress Notes (Signed)
Cephas Darby, MD [address]  [place]  [place], [place] [address]  Main: [phone]  Fax: [phone]    Gastroenterology Consultation  Referring Provider:     Abner Greenspan, MD Primary Care Physician:  Tower, Wynelle Fanny, MD Primary Gastroenterologist:  Dr. Cephas Darby Reason for Consultation:     Right-sided abdominal pain and bloating        HPI:   Courtney Pineda is a 47 y.o. female referred by Dr. Glori Bickers, Wynelle Fanny, MD  for consultation & management of chronic right-sided abdominal pain associated with bloating. Patient had a hysterectomy in October 2018. Patient reports that she has been experiencing mild to moderate, intermittent, on and off right-sided abdominal pain predominantly in the mid abdomen associated with significant bloating. She had 1 episode of intense pain that woke her up from sleep. She takes Tylenol as needed for the pain. She had workup done for the abdominal pain including CT A/P which revealed 1.4cm lesion in the right kidney, subsequently underwent MRI, confirmed as renal cyst. There was no evidence of pancreatic, hepatobiliary pathology. She denies nausea, vomiting. She has been going through moderate stress with her 2 teenagers. She does not smoke or drink alcohol. She denies constipation, diarrhea, rectal bleeding.  She denies weight loss, loss of appetite.  Follow-up visit 08/11/2017 Since last visit, patient underwent EGD and colonoscopy. There is no evidence of celiac disease or H. Pylori infection. Colonoscopy revealed normal terminal ileum and colonic mucosa. However, right colon biopsies revealed mild to moderate active colitis with no evidence of chronicity. Patient continues to have intermittent mild right upper quadrant discomfort associated with significant bloating.  She lost about 6 pounds as she has been exercising regularly, about 30 minutes daily at work. She does consume one bottle of Coke daily. She also noticed that she had  some red meat yesterday prior to the onset of bloating and right upper quadrant pain. she otherwise denies any other GI symptoms. She tried FD guard which did not provide any relief. She is on omeprazole 40 mg twice daily without any improvement in her symptoms.  Follow-up visit 09/27/2017 She continues to have right sided abdominal pain associated with bloating. These are episodic and moderate in severity. She reports trying different dietary modifications as discussed but no relief of symptoms.  NSAIDs: none  Antiplts/Anticoagulants/Anti thrombotics: none  GI Procedures: none She had cholecystectomy 1994  Past Medical History:  Diagnosis Date  . Anemia   . Atypical moles    abnormal moles removed from neck.  . History of kidney stones   . Hyperemesis gravidarum 1999    Past Surgical History:  Procedure Laterality Date  . CHOLECYSTECTOMY  06/1994  . COLONOSCOPY WITH PROPOFOL N/A 07/12/2017   Procedure: COLONOSCOPY WITH PROPOFOL;  Surgeon: Lin Landsman, MD;  Location[facility] ENDOSCOPY;  Service: Gastroenterology;  Laterality: N/A;  . CYSTOSCOPY N/A 01/14/2017   Procedure: CYSTOSCOPY;  Surgeon: Malachy Mood, MD;  Location: [facility] ORS;  Service: Gynecology;  Laterality: N/A;  . ESOPHAGOGASTRODUODENOSCOPY (EGD) WITH PROPOFOL N/A 07/12/2017   Procedure: ESOPHAGOGASTRODUODENOSCOPY (EGD) WITH PROPOFOL;  Surgeon: Lin Landsman, MD;  Location: [facility] ENDOSCOPY;  Service: Gastroenterology;  Laterality: N/A;  . KIDNEY STONE SURGERY    . LITHOTRIPSY  03/2006  . TONSILECTOMY, ADENOIDECTOMY, BILATERAL MYRINGOTOMY AND TUBES     myringotomy tubes  . TONSILLECTOMY    . TOTAL LAPAROSCOPIC HYSTERECTOMY WITH SALPINGECTOMY Bilateral 01/14/2017   Procedure: HYSTERECTOMY TOTAL LAPAROSCOPIC WITH SALPINGECTOMY;  Surgeon:  Malachy Mood, MD;  Location: [facility] ORS;  Service: Gynecology;  Laterality: Bilateral;     Current Outpatient Medications:  .  mesalamine (APRISO) 0.375 g 24 hr capsule,  Take 4 capsules (1.5 g total) by mouth daily., Disp: 120 capsule, Rfl: 1 .  omeprazole (PRILOSEC) 40 MG capsule, Take 1 capsule (40 mg total) by mouth 2 (two) times daily before a meal. (Patient not taking: Reported on 09/27/2017), Disp: 180 capsule, Rfl: 0  Family History  Problem Relation Age of Onset  . Migraines Mother   . Cancer Paternal Aunt        breast cancer  . Breast cancer Paternal Aunt   . Cancer Maternal Grandmother        brain tumor/breast cancer  . Breast cancer Maternal Grandmother 29  . Heart disease Maternal Grandfather        heart problems     Social History   Tobacco Use  . Smoking status: Never Smoker  . Smokeless tobacco: Never Used  Substance Use Topics  . Alcohol use: No    Alcohol/week: 0.0 oz  . Drug use: No    Allergies as of 09/27/2017 - Review Complete 09/27/2017  Allergen Reaction Noted  . Amoxicillin Anaphylaxis 10/18/2014  . Bee venom Anaphylaxis 01/07/2017  . Sulfa antibiotics Anaphylaxis 10/18/2014  . Sulfonamide derivatives Anaphylaxis 08/20/2006  . Penicillins Rash 01/07/2017    Review of Systems:    All systems reviewed and negative except where noted in HPI.   Physical Exam:  BP 112/69 (BP Location: Left Arm, Patient Position: Sitting, Cuff Size: Normal)   Pulse 63   Resp 16   Ht 5\' 4"  (1.626 m)   Wt 159 lb 3.2 oz (72.2 kg)   LMP 01/08/2017   BMI 27.33 kg/m  Patient's last menstrual period was 01/08/2017.  General:   Alert,  Well-developed, well-nourished, pleasant and cooperative in NAD Head:  Normocephalic and atraumatic. Eyes:  Sclera clear, no icterus.   Conjunctiva pink. Ears:  Normal auditory acuity. Nose:  No deformity, discharge, or lesions. Mouth:  No deformity or lesions,oropharynx pink & moist. Neck:  Supple; no masses or thyromegaly. Lungs:  Respirations even and unlabored.  Clear throughout to auscultation.   No wheezes, crackles, or rhonchi. No acute distress. Heart:  Regular rate and rhythm; no murmurs,  clicks, rubs, or gallops. Abdomen:  Normal bowel sounds. Soft, mild right-sided tenderness predominantly in the right mid quadrant and distended, tympanic on percussion without masses, hepatosplenomegaly or hernias noted.  No guarding or rebound tenderness.   Rectal: Not performed Msk:  Symmetrical without gross deformities. Good, equal movement & strength bilaterally. Pulses:  Normal pulses noted. Extremities:  No clubbing or edema.  No cyanosis. Neurologic:  Alert and oriented x3;  grossly normal neurologically. Skin:  Intact without significant lesions or rashes. No jaundice. Lymph Nodes:  No significant cervical adenopathy. Psych:  Alert and cooperative. Normal mood and affect.  Imaging Studies: reviewed  Assessment and Plan:   Courtney Pineda is a 47 y.o. Caucasian female with history of hysterectomy in 01/2017, presents with chronic intermittent right-sided abdominal pain associated with bloating, and no other constitutional symptoms. She is status post cholecystectomy. Mild active colitis in the right colon on random biopsies is nonspecific finding. The pathology does not confirm inflammatory bowel disease. She may have early onset of IBD or Indeterminate colitis  - she will continue to avoid processed meats, candy foods, carbonated beverages and red meat at least for 4 weeks to see if  she notices any improvement in her symptoms - I will try Apriso 2-4 pills daily - I also discussed with her about empiric trial of antibiotics for possible bacterial overgrowth as an alternative option  Follow up in 2 months   Cephas Darby, MD

## 2017-10-07 ENCOUNTER — Other Ambulatory Visit: Payer: Self-pay | Admitting: Gastroenterology

## 2017-10-27 DIAGNOSIS — D225 Melanocytic nevi of trunk: Secondary | ICD-10-CM | POA: Diagnosis not present

## 2017-10-27 DIAGNOSIS — D485 Neoplasm of uncertain behavior of skin: Secondary | ICD-10-CM | POA: Diagnosis not present

## 2017-10-27 DIAGNOSIS — Z1283 Encounter for screening for malignant neoplasm of skin: Secondary | ICD-10-CM | POA: Diagnosis not present

## 2017-10-27 DIAGNOSIS — L578 Other skin changes due to chronic exposure to nonionizing radiation: Secondary | ICD-10-CM | POA: Diagnosis not present

## 2017-10-27 DIAGNOSIS — L719 Rosacea, unspecified: Secondary | ICD-10-CM | POA: Diagnosis not present

## 2017-10-27 DIAGNOSIS — L812 Freckles: Secondary | ICD-10-CM | POA: Diagnosis not present

## 2017-11-11 ENCOUNTER — Telehealth: Payer: Self-pay | Admitting: *Deleted

## 2017-11-11 DIAGNOSIS — K529 Noninfective gastroenteritis and colitis, unspecified: Secondary | ICD-10-CM

## 2017-11-11 DIAGNOSIS — R1084 Generalized abdominal pain: Secondary | ICD-10-CM

## 2017-11-11 DIAGNOSIS — R140 Abdominal distension (gaseous): Secondary | ICD-10-CM

## 2017-11-11 NOTE — Telephone Encounter (Signed)
Copied from [place] [id number]. Topic: General - Other >> Nov 11, 2017  8:16 AM Carolyn Stare wrote:  Pt said she has been seeing the GI doctor and has had several test and is still having the same pain. She said she would like to see someone else  to see if they can find out what is wrong with her  [phone]

## 2017-11-11 NOTE — Telephone Encounter (Signed)
Left VM requesting pt to call back and let us know her answer to Dr. Marliss Coots question, CRM created

## 2017-11-11 NOTE — Telephone Encounter (Signed)
Would she rather see someone from [place] in [place] or go to [place] or [facility]?

## 2017-11-15 NOTE — Telephone Encounter (Signed)
Left message for patient to call me back. 

## 2017-11-16 ENCOUNTER — Encounter: Payer: Self-pay | Admitting: Internal Medicine

## 2017-11-16 ENCOUNTER — Encounter (INDEPENDENT_AMBULATORY_CARE_PROVIDER_SITE_OTHER): Payer: Self-pay

## 2017-11-16 ENCOUNTER — Telehealth: Payer: Self-pay

## 2017-11-16 DIAGNOSIS — K529 Noninfective gastroenteritis and colitis, unspecified: Secondary | ICD-10-CM | POA: Insufficient documentation

## 2017-11-16 NOTE — Telephone Encounter (Signed)
Referral sent over to [facility] to review and contact patient to schedule-Courtney Pineda V [name], RMA

## 2017-11-16 NOTE — Telephone Encounter (Signed)
I will see her but it will be October

## 2017-11-16 NOTE — Addendum Note (Signed)
Addended by: Loura Pardon A on: 11/16/2017 12:50 PM   Modules accepted: Orders

## 2017-11-16 NOTE — Telephone Encounter (Signed)
Left message and sent letter that she is scheduled for 12-17-17. If it doesn't work she is to call back and r/s.

## 2017-11-16 NOTE — Telephone Encounter (Signed)
Dr. Carlean Purl, This patient is looking for a second opinion. She is currently seen at [facility]. She is wanting to transfer care, because she's had multiple testing done and her symptoms continue. All records are in epic for review.

## 2017-11-16 NOTE — Telephone Encounter (Signed)
Patient  Is on vacation an stated she would like a GI from [place] for her referral. Please advise

## 2017-11-16 NOTE — Telephone Encounter (Signed)
Referral done Will route to [place]  

## 2017-11-16 NOTE — Telephone Encounter (Signed)
Patient is requesting a Referral to [facility]. Please place Epic Referral in so I can send to [place]. Spoke with Abigail Butts at [facility] and she will ask Dr Silvano Rusk to review to see if he will accept her.

## 2017-11-29 ENCOUNTER — Encounter: Payer: Self-pay | Admitting: Family Medicine

## 2017-11-29 ENCOUNTER — Ambulatory Visit: Payer: BLUE CROSS/BLUE SHIELD | Admitting: Family Medicine

## 2017-11-29 ENCOUNTER — Ambulatory Visit: Payer: BLUE CROSS/BLUE SHIELD | Admitting: Gastroenterology

## 2017-11-29 VITALS — BP 124/90 | HR 59 | Temp 98.2°F | Ht 64.0 in | Wt 161.0 lb

## 2017-11-29 DIAGNOSIS — H60332 Swimmer's ear, left ear: Secondary | ICD-10-CM | POA: Diagnosis not present

## 2017-11-29 DIAGNOSIS — J069 Acute upper respiratory infection, unspecified: Secondary | ICD-10-CM | POA: Insufficient documentation

## 2017-11-29 DIAGNOSIS — H6090 Unspecified otitis externa, unspecified ear: Secondary | ICD-10-CM | POA: Insufficient documentation

## 2017-11-29 MED ORDER — AZITHROMYCIN 250 MG PO TABS: ORAL_TABLET | ORAL | 0 refills | Status: DC

## 2017-11-29 MED ORDER — NEOMYCIN-POLYMYXIN-HC 3.5-10000-1 OT SOLN: 4.0000 [drp] | Freq: Three times a day (TID) | OTIC | 0 refills | Status: DC

## 2017-11-29 NOTE — Assessment & Plan Note (Signed)
Swimmers ear symptoms complicated by recent cold and L ear effusion (early OM) zpack px  Cortisporin otic solution tid  Update if not starting to improve in a week or if worsening   Warm compress and ibuprofen prn   Meds ordered this encounter  Medications  . neomycin-polymyxin-hydrocortisone (CORTISPORIN) OTIC solution    Sig: Place 4 drops into the left ear 3 (three) times daily.    Dispense:  10 mL    Refill:  0  . azithromycin (ZITHROMAX Z-PAK) 250 MG tablet    Sig: Take 2 pills by mouth today and then 1 pill daily for 4 days    Dispense:  6 tablet    Refill:  0

## 2017-11-29 NOTE — Patient Instructions (Addendum)
Drink fluids and get rest  Tylenol or ibuprofen  for headache /ear pain or fever  Use a warm compress on the outside of your painful ear if needed   Use the ear drops three times daily  Take the zithromax as directed   Update if not starting to improve in a week or if worsening

## 2017-11-29 NOTE — Assessment & Plan Note (Signed)
With ST and L ear pain and congestion  Disc symptomatic care - see instructions on AVS zpak px (all to pcn and sulfa) for L OM  Nasal saline/fluids/rest nsaid as tol for pain /ear swelling Update if not starting to improve in a week or if worsening

## 2017-11-29 NOTE — Progress Notes (Signed)
Subjective:    Patient ID: Courtney Pineda, female    DOB: 08-Oct-1970, 47 y.o.   MRN: [id number]  HPI Here for ST/ear pain (L) and facial pain   Ear- since last week (was on vacation the week before)  Thinks she got water in her ear  Feels swollen and it hurts  Cannot hear well out of that side   ST 2 d - did not sleep well last night  Headache-top of head  Congested- nasal d/c is clear to green  Some cough - productive / no wheezing  No otc meds    Patient Active Problem List   Diagnosis Date Noted  . Otitis externa 11/29/2017  . Upper respiratory infection 11/29/2017  . Colitis 11/16/2017  . Abdominal bloating   . Right kidney mass 06/04/2017  . RUQ abdominal pain 05/31/2017  . Hematuria 05/31/2017  . Elevated glucose level 05/22/2017  . S/P laparoscopic hysterectomy 01/14/2017  . Abdominal pain 07/27/2016  . Frequent urination 07/27/2016  . Dyspepsia 07/27/2016  . Daily headache 06/01/2016  . Encounter for routine gynecological examination 05/23/2014  . Dyspareunia 05/23/2014  . Burning sensation of mouth 09/12/2013  . Routine general medical examination at a health care facility 01/05/2011  . KIDNEY STONE 03/27/2010  . STRESS REACTION, ACUTE, WITH EMOTIONAL DISTURBANCE 09/13/2007  . HEADACHE 09/13/2007  . NEVUS, ATYPICAL 08/20/2006   Past Medical History:  Diagnosis Date  . Anemia   . Atypical moles    abnormal moles removed from neck.  . History of kidney stones   . Hyperemesis gravidarum 1999   Past Surgical History:  Procedure Laterality Date  . CHOLECYSTECTOMY  06/1994  . COLONOSCOPY WITH PROPOFOL N/A 07/12/2017   Procedure: COLONOSCOPY WITH PROPOFOL;  Surgeon: Lin Landsman, MD;  Location: [facility] ENDOSCOPY;  Service: Gastroenterology;  Laterality: N/A;  . CYSTOSCOPY N/A 01/14/2017   Procedure: CYSTOSCOPY;  Surgeon: Malachy Mood, MD;  Location: [facility] ORS;  Service: Gynecology;  Laterality: N/A;  . ESOPHAGOGASTRODUODENOSCOPY (EGD) WITH  PROPOFOL N/A 07/12/2017   Procedure: ESOPHAGOGASTRODUODENOSCOPY (EGD) WITH PROPOFOL;  Surgeon: Lin Landsman, MD;  Location: [facility] ENDOSCOPY;  Service: Gastroenterology;  Laterality: N/A;  . KIDNEY STONE SURGERY    . LITHOTRIPSY  03/2006  . TONSILECTOMY, ADENOIDECTOMY, BILATERAL MYRINGOTOMY AND TUBES     myringotomy tubes  . TONSILLECTOMY    . TOTAL LAPAROSCOPIC HYSTERECTOMY WITH SALPINGECTOMY Bilateral 01/14/2017   Procedure: HYSTERECTOMY TOTAL LAPAROSCOPIC WITH SALPINGECTOMY;  Surgeon: Malachy Mood, MD;  Location: [facility] ORS;  Service: Gynecology;  Laterality: Bilateral;   Social History   Tobacco Use  . Smoking status: Never Smoker  . Smokeless tobacco: Never Used  Substance Use Topics  . Alcohol use: No    Alcohol/week: 0.0 standard drinks  . Drug use: No   Family History  Problem Relation Age of Onset  . Migraines Mother   . Cancer Paternal Aunt        breast cancer  . Breast cancer Paternal Aunt   . Cancer Maternal Grandmother        brain tumor/breast cancer  . Breast cancer Maternal Grandmother 10  . Heart disease Maternal Grandfather        heart problems   Allergies  Allergen Reactions  . Amoxicillin Anaphylaxis  . Bee Venom Anaphylaxis  . Sulfa Antibiotics Anaphylaxis  . Sulfonamide Derivatives Anaphylaxis    Lungs close up  . Penicillins Rash    Has patient had a PCN reaction causing immediate rash, facial/tongue/throat swelling, SOB or lightheadedness with  hypotension: No Has patient had a PCN reaction causing severe rash involving mucus membranes or skin necrosis: No Has patient had a PCN reaction that required hospitalization: No Has patient had a PCN reaction occurring within the last 10 years: No If all of the above answers are "NO", then may proceed with Cephalosporin use.    Current Outpatient Medications on File Prior to Visit  Medication Sig Dispense Refill  . mesalamine (APRISO) 0.375 g 24 hr capsule Take 4 capsules (1.5 g total) by mouth  daily. 120 capsule 1   No current facility-administered medications on file prior to visit.      Review of Systems  Constitutional: Positive for appetite change and fatigue. Negative for fever.  HENT: Positive for congestion, ear pain, hearing loss, postnasal drip, rhinorrhea, sinus pressure, sneezing, sore throat and voice change. Negative for ear discharge and facial swelling.   Eyes: Negative for pain and discharge.  Respiratory: Positive for cough. Negative for shortness of breath, wheezing and stridor.   Cardiovascular: Negative for chest pain.  Gastrointestinal: Negative for diarrhea, nausea and vomiting.  Genitourinary: Negative for frequency, hematuria and urgency.  Musculoskeletal: Negative for arthralgias and myalgias.  Skin: Negative for rash.  Neurological: Positive for headaches. Negative for dizziness, weakness and light-headedness.  Psychiatric/Behavioral: Negative for confusion and dysphoric mood.       Objective:   Physical Exam  Constitutional: She appears well-developed and well-nourished. No distress.  Well appearing Hoarse voice   HENT:  Head: Normocephalic and atraumatic.  Right Ear: External ear normal.  Mouth/Throat: Oropharynx is clear and moist.  Nares are injected and congested  No sinus tenderness Clear rhinorrhea and post nasal drip   L TM- dull/slt pink with effusion L ear canal also mildly swollen with erythema/no drainage   Eyes: Pupils are equal, round, and reactive to light. Conjunctivae and EOM are normal. Right eye exhibits no discharge. Left eye exhibits no discharge. No scleral icterus.  Neck: Normal range of motion. Neck supple.  Cardiovascular: Normal rate and normal heart sounds.  Pulmonary/Chest: Effort normal and breath sounds normal. No respiratory distress. She has no wheezes. She has no rales. She exhibits no tenderness.  Good air exch Some upper airway sounds   Lymphadenopathy:    She has no cervical adenopathy.  Neurological:  She is alert.  Skin: Skin is warm and dry. No rash noted. No erythema.  Psychiatric: She has a normal mood and affect.          Assessment & Plan:   Problem List Items Addressed This Visit      Respiratory   Upper respiratory infection - Primary    With ST and L ear pain and congestion  Disc symptomatic care - see instructions on AVS zpak px (all to pcn and sulfa) for L OM  Nasal saline/fluids/rest nsaid as tol for pain /ear swelling Update if not starting to improve in a week or if worsening        Relevant Medications   azithromycin (ZITHROMAX Z-PAK) 250 MG tablet     Nervous and Auditory   Otitis externa    Swimmers ear symptoms complicated by recent cold and L ear effusion (early OM) zpack px  Cortisporin otic solution tid  Update if not starting to improve in a week or if worsening   Warm compress and ibuprofen prn   Meds ordered this encounter  Medications  . neomycin-polymyxin-hydrocortisone (CORTISPORIN) OTIC solution    Sig: Place 4 drops into the left ear 3 (  three) times daily.    Dispense:  10 mL    Refill:  0  . azithromycin (ZITHROMAX Z-PAK) 250 MG tablet    Sig: Take 2 pills by mouth today and then 1 pill daily for 4 days    Dispense:  6 tablet    Refill:  0

## 2017-11-30 ENCOUNTER — Encounter: Payer: Self-pay | Admitting: Family Medicine

## 2017-12-17 ENCOUNTER — Encounter: Payer: Self-pay | Admitting: Internal Medicine

## 2017-12-17 ENCOUNTER — Other Ambulatory Visit (INDEPENDENT_AMBULATORY_CARE_PROVIDER_SITE_OTHER): Payer: BLUE CROSS/BLUE SHIELD

## 2017-12-17 ENCOUNTER — Ambulatory Visit: Payer: BLUE CROSS/BLUE SHIELD | Admitting: Internal Medicine

## 2017-12-17 VITALS — BP 106/64 | HR 60 | Ht 64.0 in | Wt 161.4 lb

## 2017-12-17 DIAGNOSIS — R1011 Right upper quadrant pain: Secondary | ICD-10-CM

## 2017-12-17 DIAGNOSIS — K639 Disease of intestine, unspecified: Secondary | ICD-10-CM

## 2017-12-17 DIAGNOSIS — R140 Abdominal distension (gaseous): Secondary | ICD-10-CM | POA: Diagnosis not present

## 2017-12-17 DIAGNOSIS — R12 Heartburn: Secondary | ICD-10-CM

## 2017-12-17 LAB — CBC WITH DIFFERENTIAL/PLATELET
BASOS ABS: 0 10*3/uL (ref 0.0–0.1)
Basophils Relative: 0.8 % (ref 0.0–3.0)
EOS PCT: 2.5 % (ref 0.0–5.0)
Eosinophils Absolute: 0.2 10*3/uL (ref 0.0–0.7)
HCT: 44.5 % (ref 36.0–46.0)
HEMOGLOBIN: 15.2 g/dL — AB (ref 12.0–15.0)
LYMPHS PCT: 33.6 % (ref 12.0–46.0)
Lymphs Abs: 2.1 10*3/uL (ref 0.7–4.0)
MCHC: 34.1 g/dL (ref 30.0–36.0)
MCV: 91.2 fl (ref 78.0–100.0)
MONOS PCT: 7.9 % (ref 3.0–12.0)
Monocytes Absolute: 0.5 10*3/uL (ref 0.1–1.0)
Neutro Abs: 3.4 10*3/uL (ref 1.4–7.7)
Neutrophils Relative %: 55.2 % (ref 43.0–77.0)
Platelets: 285 10*3/uL (ref 150.0–400.0)
RBC: 4.88 Mil/uL (ref 3.87–5.11)
RDW: 12.8 % (ref 11.5–15.5)
WBC: 6.2 10*3/uL (ref 4.0–10.5)

## 2017-12-17 LAB — HEPATIC FUNCTION PANEL
ALT: 25 U/L (ref 0–35)
AST: 23 U/L (ref 0–37)
Albumin: 4.4 g/dL (ref 3.5–5.2)
Alkaline Phosphatase: 114 U/L (ref 39–117)
BILIRUBIN DIRECT: 0.1 mg/dL (ref 0.0–0.3)
BILIRUBIN TOTAL: 0.8 mg/dL (ref 0.2–1.2)
Total Protein: 7.3 g/dL (ref 6.0–8.3)

## 2017-12-17 LAB — FERRITIN: FERRITIN: 44.6 ng/mL (ref 10.0–291.0)

## 2017-12-17 MED ORDER — PANTOPRAZOLE SODIUM 20 MG PO TBEC: 20.0000 mg | DELAYED_RELEASE_TABLET | Freq: Every day | ORAL | 0 refills | Status: DC

## 2017-12-17 NOTE — Progress Notes (Signed)
m °

## 2017-12-17 NOTE — Patient Instructions (Addendum)
Your provider has requested that you go to the basement level for lab work before leaving today. Press "B" on the elevator. The lab is located at the first door on the left as you exit the elevator.   We have sent the following medications to your pharmacy for you to pick up at your convenience: Pantoprazole  We are giving you a handout to read and follow on GERD.   We are giving you samples of IBgard today to try.   You have been given a testing kit to check for Flesch intestine bacterial overgrowth (SIBO) which is completed by a company named [facility]. Make sure to return your test in the mail using the return mailing label given you along with the kit. Your demographic and insurance information have already been sent to the company and they should be in contact with you over the next week regarding this test. Please keep in mind that you will be getting a call from phone number [phone] or a similar number. If you do not hear from them within this time frame, please call our office at [phone].   DO NOT START ANY OF THE MEDICINES UNTIL AFTER YOU DO YOUR TESTING.  We would like to see you back 02/09/18 at 3:45pm.   I appreciate the opportunity to care for you. Silvano Rusk, MD, [facility]

## 2017-12-17 NOTE — Progress Notes (Signed)
Courtney Pineda 47 y.o. 01-19-1971 [id number]  Assessment & Plan:   Encounter Diagnoses  Name Primary?  . RUQ pain Yes  . Bloating   . Heartburn   . Mucosal abnormality of colon - microscopic    The cause of her problems are not clear.  I wonder if she could have bacterial overgrowth.  IBS certainly possible.  I doubt the colon biopsies are significant.  I wonder if there was a prep abnormality.  Fortunately no signs of chronicity.  However I do agree with Dr. Marius Ditch but it could be the onset of an inflammatory bowel disease.  She does not have fatty liver which could explain the symptoms.  Diagnostic and treatment plan as below.  CBC, ferritin, LFT SIBO test IB Gard samples Keep working on diet  I appreciate the opportunity to care for this patient. CC: Tower, Wynelle Fanny, MD  Subjective:   Chief Complaint: Abdominal pain, second opinion  HPI The patient is here for a second opinion, earlier this year she started seeing Dr. Marius Ditch because of abdominal pain, and she reports constant right upper quadrant pain bloating without nausea or vomiting and belching and bloating and throat burning for the past few weeks.  Wt Readings from Last 3 Encounters:  12/17/17 161 lb 6.4 oz (73.2 kg)  11/29/17 161 lb (73 kg)  09/27/17 159 lb 3.2 oz (72.2 kg)   5# increase in weight she thinks.  She is trying to lose weight and modify her diet and live "cleaner" and healthier.  She is working on that through incentives in Programmer, applications and to [employer] plan at work.  Things seem to be okay until she had a hysterectomy in October 2018 and she is been having a mild to moderate intermittent right-sided pain with bloating in that area and one episode where she awakened from sleep.  Tylenol as needed was tried.  No nausea or vomiting, some stress with 2 teenagers.  No unintentional weight loss loss of appetite.  CT scanning earlier this year with contrast abdomen pelvis kidney lesion turned  out to be a cyst that did not need follow-up without other significant pathology.  She is status post cholecystectomy.  She was given a trial of FD guard in March and underwent an EGD and a colonoscopy by Dr.  Marius Ditch.  The EGD was unremarkable without evidence for celiac disease or H. pylori infection and I believe antibodies have been negative as well.  She had a normal terminal ileum and normal colonic mucosa but had random colon biopsies but the right colon biopsies revealed a mild to moderate active colitis without signs of chronicity.  She was exercising.  She was taking omeprazole 40 mg twice daily at this may office visit without any improvement in the FD guard did not help.  Dietary modifications to avoid processed meats candy foods carbonated beverages to see if that helped discussed empiric trial of antibiotics and a possible trial of a Apriso.  She followed up with Dr. Marius Ditch in June and really was not any better after the dietary modification so the a pre-Szo was tried and that has not helped either.  She is not really having diarrhea or constipation.  She did not have the symptoms prior to 2018 that we are aware of. She was anemic prior to her hysterectomy and it had improved to a hemoglobin of 13.6 with an elevated RDW still in February.  A ferritin was 5.5 in February.  Allergies  Allergen  Reactions  . Amoxicillin Anaphylaxis  . Bee Venom Anaphylaxis  . Sulfa Antibiotics Anaphylaxis  . Sulfonamide Derivatives Anaphylaxis    Lungs close up  . Penicillins Rash    Has patient had a PCN reaction causing immediate rash, facial/tongue/throat swelling, SOB or lightheadedness with hypotension: No Has patient had a PCN reaction causing severe rash involving mucus membranes or skin necrosis: No Has patient had a PCN reaction that required hospitalization: No Has patient had a PCN reaction occurring within the last 10 years: No If all of the above answers are "NO", then may proceed with  Cephalosporin use.    Current Meds  Medication Sig  . neomycin-polymyxin-hydrocortisone (CORTISPORIN) OTIC solution Place 4 drops into the left ear 3 (three) times daily. (Patient taking differently: Place 4 drops into the left ear 3 (three) times daily as needed. )  . Peppermint Oil (IBGARD PO) Take by mouth.  . [DISCONTINUED] mesalamine (APRISO) 0.375 g 24 hr capsule Take 4 capsules (1.5 g total) by mouth daily.   Past Medical History:  Diagnosis Date  . Anemia   . Atypical moles    abnormal moles removed from neck.  . History of kidney stones   . Hyperemesis gravidarum 1999   Past Surgical History:  Procedure Laterality Date  . CHOLECYSTECTOMY  06/1994  . COLONOSCOPY WITH PROPOFOL N/A 07/12/2017   Procedure: COLONOSCOPY WITH PROPOFOL;  Surgeon: Lin Landsman, MD;  Location: [facility] ENDOSCOPY;  Service: Gastroenterology;  Laterality: N/A;  . CYSTOSCOPY N/A 01/14/2017   Procedure: CYSTOSCOPY;  Surgeon: Malachy Mood, MD;  Location: [facility] ORS;  Service: Gynecology;  Laterality: N/A;  . ESOPHAGOGASTRODUODENOSCOPY (EGD) WITH PROPOFOL N/A 07/12/2017   Procedure: ESOPHAGOGASTRODUODENOSCOPY (EGD) WITH PROPOFOL;  Surgeon: Lin Landsman, MD;  Location: [facility] ENDOSCOPY;  Service: Gastroenterology;  Laterality: N/A;  . KIDNEY STONE SURGERY    . LITHOTRIPSY  03/2006  . TONSILECTOMY, ADENOIDECTOMY, BILATERAL MYRINGOTOMY AND TUBES     myringotomy tubes  . TOTAL LAPAROSCOPIC HYSTERECTOMY WITH SALPINGECTOMY Bilateral 01/14/2017   Procedure: HYSTERECTOMY TOTAL LAPAROSCOPIC WITH SALPINGECTOMY;  Surgeon: Malachy Mood, MD;  Location: [facility] ORS;  Service: Gynecology;  Laterality: Bilateral;   Social History   Social History Narrative   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, she is an Theatre manager   Rare caffeine and never smoker no alcohol no drug or substance use   family history includes Breast cancer in her paternal aunt; Cancer (age of onset: 49) in her  maternal grandmother; Heart disease in her maternal grandfather; Migraines in her mother.   Review of Systems As per HPI.  She had a sore throat and some congestion in mid August.  All other review of systems appear negative.  Objective:   Physical Exam @BP  106/64   Pulse 60   Ht 5\' 4"  (1.626 m)   Wt 161 lb 6.4 oz (73.2 kg)   LMP 01/08/2017   BMI 27.70 kg/m @  General:  Well-developed, well-nourished and in no acute distress Eyes:  anicteric. ENT:   Mouth and posterior pharynx free of lesions.  Neck:   supple w/o thyromegaly or mass.  Lungs: Clear to auscultation bilaterally. Heart:  S1S2, no rubs, murmurs, gallops. Abdomen:  soft, non-tender, no hepatosplenomegaly, hernia, or mass and BS+.   Lymph:  no cervical or supraclavicular adenopathy. Extremities:   no edema, cyanosis or clubbing Skin   no rash. Neuro:  A&O x 3.  Psych:  appropriate mood and  Affect.   Data Reviewed: See  HPI.  I have reviewed primary care and GI notes and colonoscopy reports and images and pathology reports and images of her CT scan.

## 2017-12-20 ENCOUNTER — Encounter: Payer: Self-pay | Admitting: Internal Medicine

## 2017-12-24 NOTE — Progress Notes (Signed)
Labs ok SIBO test next/pending My chart

## 2018-01-03 ENCOUNTER — Ambulatory Visit: Payer: BLUE CROSS/BLUE SHIELD | Admitting: Gastroenterology

## 2018-01-06 ENCOUNTER — Ambulatory Visit (INDEPENDENT_AMBULATORY_CARE_PROVIDER_SITE_OTHER): Payer: BLUE CROSS/BLUE SHIELD

## 2018-01-06 DIAGNOSIS — Z23 Encounter for immunization: Secondary | ICD-10-CM

## 2018-01-31 ENCOUNTER — Telehealth: Payer: Self-pay | Admitting: Internal Medicine

## 2018-01-31 NOTE — Telephone Encounter (Signed)
Patient advised that she need to find a time to perform the SIBO breath test.  I rescheduled her appt to 03/15/18 4:00

## 2018-02-06 ENCOUNTER — Encounter: Payer: Self-pay | Admitting: Internal Medicine

## 2018-02-06 DIAGNOSIS — K529 Noninfective gastroenteritis and colitis, unspecified: Secondary | ICD-10-CM | POA: Diagnosis not present

## 2018-02-06 DIAGNOSIS — R140 Abdominal distension (gaseous): Secondary | ICD-10-CM | POA: Diagnosis not present

## 2018-02-06 DIAGNOSIS — R1011 Right upper quadrant pain: Secondary | ICD-10-CM | POA: Diagnosis not present

## 2018-02-07 ENCOUNTER — Telehealth: Payer: Self-pay | Admitting: Internal Medicine

## 2018-02-07 NOTE — Telephone Encounter (Signed)
The pt has been advised that she should start protonix and IBguard that was prescribed at her last office visit since she has now completed the breath test and if the pain is severe she should go to the ED.

## 2018-02-07 NOTE — Telephone Encounter (Signed)
Pt has more concern and wants to talk to you again

## 2018-02-07 NOTE — Telephone Encounter (Signed)
The pt completed her breath test last night and will now begin protonix and IBGUARD and call back if the right sided abd pain doesn't resolve or at least get better.

## 2018-02-09 ENCOUNTER — Telehealth: Payer: Self-pay | Admitting: Internal Medicine

## 2018-02-09 ENCOUNTER — Ambulatory Visit: Payer: BLUE CROSS/BLUE SHIELD | Admitting: Internal Medicine

## 2018-02-09 DIAGNOSIS — K6389 Other specified diseases of intestine: Secondary | ICD-10-CM

## 2018-02-09 DIAGNOSIS — K638219 Small intestinal bacterial overgrowth, unspecified: Secondary | ICD-10-CM

## 2018-02-09 HISTORY — DX: Other specified diseases of intestine: K63.89

## 2018-02-09 HISTORY — DX: Small intestinal bacterial overgrowth, unspecified: K63.8219

## 2018-02-09 NOTE — Telephone Encounter (Signed)
I spoke with the lab and updated the ICD 10 codes based on last OV

## 2018-02-09 NOTE — Telephone Encounter (Signed)
Lab calling regarding ICD 10 code for test that pt had done recently. I believe it was a lactulose test but not sure.

## 2018-03-14 ENCOUNTER — Telehealth: Payer: Self-pay

## 2018-03-14 ENCOUNTER — Encounter: Payer: Self-pay | Admitting: Internal Medicine

## 2018-03-14 MED ORDER — RIFAXIMIN 550 MG PO TABS: 550.0000 mg | ORAL_TABLET | Freq: Three times a day (TID) | ORAL | 0 refills | Status: DC

## 2018-03-14 NOTE — Telephone Encounter (Signed)
Left message for patient to call back rx sent 

## 2018-03-14 NOTE — Telephone Encounter (Signed)
Patient notified rx sent I cancelled the OV for tomorrow.   She will call back with an update 2 weeks after completing the tx

## 2018-03-14 NOTE — Telephone Encounter (Signed)
-----   Message from Gatha Mayer, MD sent at 03/14/2018  7:47 AM EST ----- Regarding: SIBO + SIBO test is +  Please Rx Xifaxan 550 mg tid for IBS-D and tell her to contact us 2 weeks after taking it to see if that helps her  Also My apologies for the lateness of notifying her  Any ? Let me know

## 2018-03-15 ENCOUNTER — Ambulatory Visit: Payer: BLUE CROSS/BLUE SHIELD | Admitting: Internal Medicine

## 2018-04-18 ENCOUNTER — Ambulatory Visit (INDEPENDENT_AMBULATORY_CARE_PROVIDER_SITE_OTHER)
Admission: RE | Admit: 2018-04-18 | Discharge: 2018-04-18 | Disposition: A | Discharge status: Home / Self Care | Payer: BLUE CROSS/BLUE SHIELD | Source: Ambulatory Visit | Attending: Physician Assistant | Admitting: Physician Assistant

## 2018-04-18 ENCOUNTER — Encounter

## 2018-04-18 ENCOUNTER — Ambulatory Visit: Payer: BLUE CROSS/BLUE SHIELD | Admitting: Physician Assistant

## 2018-04-18 ENCOUNTER — Other Ambulatory Visit (INDEPENDENT_AMBULATORY_CARE_PROVIDER_SITE_OTHER): Payer: BLUE CROSS/BLUE SHIELD

## 2018-04-18 ENCOUNTER — Encounter: Payer: Self-pay | Admitting: Physician Assistant

## 2018-04-18 VITALS — BP 110/70 | HR 66 | Ht 64.0 in | Wt 165.0 lb

## 2018-04-18 DIAGNOSIS — R0789 Other chest pain: Secondary | ICD-10-CM

## 2018-04-18 DIAGNOSIS — R1011 Right upper quadrant pain: Secondary | ICD-10-CM

## 2018-04-18 LAB — SEDIMENTATION RATE: Sed Rate: 5 mm/hr (ref 0–20)

## 2018-04-18 LAB — HIGH SENSITIVITY CRP: CRP, High Sensitivity: 3.91 mg/L (ref 0.000–5.000)

## 2018-04-18 MED ORDER — PANTOPRAZOLE SODIUM 40 MG PO TBEC: DELAYED_RELEASE_TABLET | ORAL | 0 refills | Status: DC

## 2018-04-18 MED ORDER — MELOXICAM 7.5 MG PO TABS: ORAL_TABLET | ORAL | 0 refills | Status: DC

## 2018-04-18 NOTE — Progress Notes (Addendum)
Subjective:    Patient ID: Courtney Pineda, female    DOB: [birth year], 48 y.o.   MRN: [id number]  HPI Courtney Pineda is a pleasant 48 year old white female, known to Dr. Carlean Purl who was last seen in the office in September 2019.  She was seen at that time as a second opinion after having undergone evaluation earlier in the year by Dr. Julaine Fusi gastroenterology.  She had had EGD and colonoscopy per Dr. Marius Ditch.  Colonoscopy was done in April which was a normal exam however biopsies of the right colon showed focal mild to moderate active colitis with no features of chronicity.  Consider resolving infectious, drug-induced or early IBD.  Biopsies of the [place] bowel were done at EGD and were negative, she had mild gastritis without evidence of H. pylori. She had also had CT of the abdomen and pelvis in February 2019 which showed left nephrolithiasis and a right kidney cyst which was indeterminate.  Subsequent MRI showed that to be a benign Bosniak category 2 cyst. It was Dr. 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   She was given a trial of IBgard without any benefit.  Breath testing was recommended for Morsch intestinal bacterial overgrowth and she completed that just in December 2019.  This was positive and she has completed a 14-day course of Xifaxan.  She has not noticed any change at all in her symptoms. Status post remote cholecystectomy in 1996 and had a laparoscopic hysterectomy in October 2018. Her current symptoms started in February 2019.  She says symptoms have been fairly persistent with pain in her right upper quadrant which she describes as a dull discomfort that occasionally becomes sharp.  She is not aware of any exacerbation after oral intake, and has not noticed any changes in pain with bowel movements.  She has been having normal bowel movements, no melena or hematochezia.  She is not aware of any positional changes in her symptoms  that either alleviate or exacerbate.  She is not had any injury that she is aware of. She describes a fullness on the right side, or a "knot" sensation in the right upper quadrant.  She has not had any nausea or vomiting, weight is been stable actually has been gaining. Labs done in September including CBC LFTs and iron studies were within normal limits.  Review of Systems Pertinent positive and negative review of systems were noted in the above HPI section.  All other review of systems was otherwise negative.  Outpatient Encounter Medications as of 04/18/2018  Medication Sig  . meloxicam (MOBIC) 7.5 MG tablet Take 1 tablet by mouth with food once daily for 2 weeks.  . pantoprazole (PROTONIX) 40 MG tablet Take 1 tablet by mouth every morning for one month.  . [DISCONTINUED] neomycin-polymyxin-hydrocortisone (CORTISPORIN) OTIC solution Place 4 drops into the left ear 3 (three) times daily. (Patient not taking: Reported on 04/18/2018)  . [DISCONTINUED] pantoprazole (PROTONIX) 20 MG tablet Take 1 tablet (20 mg total) by mouth daily before breakfast. (Patient not taking: Reported on 04/18/2018)  . [DISCONTINUED] Peppermint Oil (IBGARD PO) Take by mouth.  . [DISCONTINUED] rifaximin (XIFAXAN) 550 MG TABS tablet Take 1 tablet (550 mg total) by mouth 3 (three) times daily. (Patient not taking: Reported on 04/18/2018)   No facility-administered encounter medications on file as of 04/18/2018.    Allergies  Allergen Reactions  . Amoxicillin Anaphylaxis  . Bee Venom Anaphylaxis  . Sulfa Antibiotics Anaphylaxis  . Sulfonamide Derivatives Anaphylaxis  Lungs close up  . Penicillins Rash    Has patient had a PCN reaction causing immediate rash, facial/tongue/throat swelling, SOB or lightheadedness with hypotension: No Has patient had a PCN reaction causing severe rash involving mucus membranes or skin necrosis: No Has patient had a PCN reaction that required hospitalization: No Has patient had a PCN reaction  occurring within the last 10 years: No If all of the above answers are "NO", then may proceed with Cephalosporin use.    Patient Active Problem List   Diagnosis Date Noted  . 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 Otitis externa 11/29/2017  . Upper respiratory infection 11/29/2017  . Colitis 11/16/2017  . Abdominal bloating   . Right kidney mass 06/04/2017  . RUQ abdominal pain 05/31/2017  . Hematuria 05/31/2017  . Elevated glucose level 05/22/2017  . S/P laparoscopic hysterectomy 01/14/2017  . Abdominal pain 07/27/2016  . Frequent urination 07/27/2016  . Dyspepsia 07/27/2016  . Daily headache 06/01/2016  . Encounter for routine gynecological examination 05/23/2014  . Dyspareunia 05/23/2014  . Burning sensation of mouth 09/12/2013  . Routine general medical examination at a health care facility 01/05/2011  . KIDNEY STONE 03/27/2010  . STRESS REACTION, ACUTE, WITH EMOTIONAL DISTURBANCE 09/13/2007  . HEADACHE 09/13/2007  . NEVUS, ATYPICAL 08/20/2006   Social History   Socioeconomic History  . Marital status: Married    Spouse name: Not on file  . Number of children: 2  . Years of education: Not on file  . Highest education level: Not on file  Occupational History  . Occupation: Optician, dispensing: [employer]  Social Needs  . Financial resource strain: Not on file  . Food insecurity:    Worry: Not on file    Inability: Not on file  . Transportation needs:    Medical: Not on file    Non-medical: Not on file  Tobacco Use  . Smoking status: Never Smoker  . Smokeless tobacco: Never Used  Substance and Sexual Activity  . Alcohol use: No    Alcohol/week: 0.0 standard drinks  . Drug use: No  . Sexual activity: Yes    Birth control/protection: None  Lifestyle  . Physical activity:    Days per week: 3 days    Minutes per session: 30 min  . Stress: Only a little  Relationships  . Social connections:    Talks on phone: More than three times a  week    Gets together: More than three times a week    Attends religious service: More than 4 times per year    Active member of club or organization: No    Attends meetings of clubs or organizations: Never    Relationship status: Married  . Intimate partner violence:    Fear of current or ex partner: No    Emotionally abused: No    Physically abused: No    Forced sexual activity: No  Other Topics Concern  . Not on file  Social History Narrative   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, she is an Theatre manager   Rare caffeine and never smoker no alcohol no drug or substance use    Courtney Pineda family history includes Breast cancer in her paternal aunt; Cancer (age of onset: 17) in her maternal grandmother; Heart disease in her maternal grandfather; Migraines in her mother.      Objective:    Vitals:   04/18/18 1529  BP: 110/70  Pulse: 66  SpO2: 97%    Physical Exam; well-developed white female in no acute distress, pleasant, height 5 foot 4, weight 165, BMI 28.3.  HEENT; nontraumatic normocephalic EOMI PERRLA sclera anicteric, oral mucosa moist, Cardiovascular; regular rate and rhythm with S1-S2 no murmur rub or gallop, Pulmonary ;clear bilaterally, Abdomen; soft, bowel sounds are present no palpable mass or hepatosplenomegaly she has mild tenderness in the right upper quadrant, and along the right costal margin and into the right lateral and right lower posterior ribs to palpation.  Bowel sounds are present, Rectal ;exam not done, Extremities ;no clubbing cyanosis or edema skin warm dry, Neuropsych ;alert and oriented, grossly nonfocal mood and affect appropriate       Assessment & Plan:   #55 48 year old white female with 68-month history of persistent right upper quadrant pain, described as dull with occasional sharp twinges, nonprogressive. Patient has had fairly extensive GI evaluation with endoscopy, colonoscopy, CT scan of the abdomen and pelvis, MRI  of the abdomen. Biopsies of the right colon showed a focal mild to moderate active colitis with no features of chronicity.  She had no response to trial of mesalamine. No response to IBgard, or recent course of Xifaxan given for Degrace intestinal bacterial overgrowth.  She is status post remote cholecystectomy  On exam today she is definitely tender over the right lower anterior lateral and posterior ribs and at the costal margin.  I suspect her current pain has a musculoskeletal component, rule out costochondritis, or possible abdominal wall pain.  #2 status post laparoscopic hysterectomy October 2018 #3.  History of ureterolithiasis #4.  Right kidney benign cyst  Plan; we will start a course of Protonix 40 mg p.o. every morning x30 days Check Right lower Rib details Sed rate/CRP Start Mobic 7.5 mg p.o. every morning to be taken with food x2 weeks I also recommended she try Biofreeze or icy hot over-the-counter 2-3 times daily over the area of pain We will plan to see her back in 3 to 4 weeks with Dr. Carlean Purl or myself.  Amy S Esterwood PA-C 04/18/2018   Cc: Tower, Wynelle Fanny, MD  Agree with Courtney Pineda assessment and plan. Flexeril or similar agent is another option for Tx Gatha Mayer, MD, [facility]

## 2018-04-18 NOTE — Patient Instructions (Addendum)
Your provider has requested that you go to the basement level for lab work and the radiology department before leaving today. Press "B" on the elevator. The lab is located at the first door on the left as you exit the elevator. 1. Pantoprazole sodium 40 mg 2. Meloxicam 7.5 mg  Get Biofreeze or Icy hot and apply to area of pain twice daily.  We made you an appointment with Dr. Silvano Rusk for 05-20-2018 at 4:00 PM.  Normal BMI (Body Mass Index- based on height and weight) is between 19 and 25. Your BMI today is Body mass index is 28.32 kg/m. Marland Kitchen Please consider follow up  regarding your BMI with your Primary Care Provider.

## 2018-05-18 ENCOUNTER — Ambulatory Visit: Payer: BLUE CROSS/BLUE SHIELD | Admitting: Internal Medicine

## 2018-05-19 ENCOUNTER — Other Ambulatory Visit: Payer: Self-pay | Admitting: Family Medicine

## 2018-05-19 DIAGNOSIS — Z1231 Encounter for screening mammogram for malignant neoplasm of breast: Secondary | ICD-10-CM

## 2018-05-20 ENCOUNTER — Ambulatory Visit: Payer: BLUE CROSS/BLUE SHIELD | Admitting: Internal Medicine

## 2018-05-20 ENCOUNTER — Encounter: Payer: Self-pay | Admitting: Internal Medicine

## 2018-05-20 VITALS — BP 104/64 | HR 66 | Ht 64.0 in | Wt 168.0 lb

## 2018-05-20 DIAGNOSIS — R1011 Right upper quadrant pain: Secondary | ICD-10-CM | POA: Diagnosis not present

## 2018-05-20 DIAGNOSIS — G8929 Other chronic pain: Secondary | ICD-10-CM

## 2018-05-20 NOTE — Progress Notes (Signed)
Courtney Pineda 48 y.o. 1970-05-04 [id number]  Assessment & Plan:   Encounter Diagnosis  Name Primary?  . Chronic RUQ pain - musculoskeletal Yes     I have explained to her that I do not think her main issues are gastrointestinal in origin.  Perhaps there is a pain medication physician somewhere they could do a local injection, I talked about the possibility of using diclofenac gel.  Other options could potentially be some type of neuromodulator to help with the pain.  I am not inclined to prescribe any of those other than the diclofenac.  She has a physical coming up with primary care later this month.  She will discuss options with Dr. Glori Bickers.  I tried to reassure her that we have an extensive body of information and objective testing that tells [place] there is no serious threat to her health going on at the same time understanding that her quality of life is impaired with this pain.  I do not have any other specific treatment to offer her for her current problems.  I can see her for new problems that seem to be GI in origin but it will not make sense for me to see her again for these problems.  I told her to discontinue pantoprazole  I appreciate the opportunity to care for this patient. CC: Tower, Wynelle Fanny, MD   Subjective:   Chief Complaint: Right upper quadrant pain  HPI The patient is here for follow-up of right upper quadrant pain which is been a chronic issue for a year or more with negative CT and MRI imaging.  She saw Amy Esterwood PA-C in early January, and he thought she probably had a musculoskeletal component to the pain and put her on meloxicam for 2 weeks as well as some concomitant pantoprazole.  The patient is no better she continues to have intermittent episodic and unpredictable sharp right upper quadrant and lateral pain in the area of the ribs.  It is without obvious trigger.  She wants to know "what is under there".  Still feels bloated at times as well.   Recall that she has had a diagnosis of Chastang intestinal bacterial overgrowth but did not respond to a trial of Xifaxan.  She had previously seen Dr. Marius Ditch of [facility].  She had random biopsies with a suggestion of colitis that I thought was probably prep effect.  Colonoscopy had been done.  Extensive work-up there with EGD as well. Allergies  Allergen Reactions  . Amoxicillin Anaphylaxis  . Bee Venom Anaphylaxis  . Sulfa Antibiotics Anaphylaxis  . Sulfonamide Derivatives Anaphylaxis    Lungs close up  . Penicillins Rash    Has patient had a PCN reaction causing immediate rash, facial/tongue/throat swelling, SOB or lightheadedness with hypotension: No Has patient had a PCN reaction causing severe rash involving mucus membranes or skin necrosis: No Has patient had a PCN reaction that required hospitalization: No Has patient had a PCN reaction occurring within the last 10 years: No If all of the above answers are "NO", then may proceed with Cephalosporin use.    Current Meds  Medication Sig  . [DISCONTINUED] pantoprazole (PROTONIX) 40 MG tablet Take 1 tablet by mouth every morning for one month.   Past Medical History:  Diagnosis Date  . Anemia   . Atypical moles    abnormal moles removed from neck.  . Chronic right upper quadrant pain   . History of kidney stones   . Hyperemesis gravidarum 1999  .  Lukins intestinal bacterial overgrowth 02/09/2018   Past Surgical History:  Procedure Laterality Date  . CHOLECYSTECTOMY  06/1994  . COLONOSCOPY WITH PROPOFOL N/A 07/12/2017   Procedure: COLONOSCOPY WITH PROPOFOL;  Surgeon: Lin Landsman, MD;  Location: [facility] ENDOSCOPY;  Service: Gastroenterology;  Laterality: N/A;  . CYSTOSCOPY N/A 01/14/2017   Procedure: CYSTOSCOPY;  Surgeon: Malachy Mood, MD;  Location: [facility] ORS;  Service: Gynecology;  Laterality: N/A;  . ESOPHAGOGASTRODUODENOSCOPY (EGD) WITH PROPOFOL N/A 07/12/2017   Procedure: ESOPHAGOGASTRODUODENOSCOPY (EGD) WITH PROPOFOL;   Surgeon: Lin Landsman, MD;  Location: [facility] ENDOSCOPY;  Service: Gastroenterology;  Laterality: N/A;  . KIDNEY STONE SURGERY    . LITHOTRIPSY  03/2006  . TONSILECTOMY, ADENOIDECTOMY, BILATERAL MYRINGOTOMY AND TUBES     myringotomy tubes  . TOTAL LAPAROSCOPIC HYSTERECTOMY WITH SALPINGECTOMY Bilateral 01/14/2017   Procedure: HYSTERECTOMY TOTAL LAPAROSCOPIC WITH SALPINGECTOMY;  Surgeon: Malachy Mood, MD;  Location: [facility] ORS;  Service: Gynecology;  Laterality: Bilateral;   Social History   Social History Narrative   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, she is an Theatre manager   Rare caffeine and never smoker no alcohol no drug or substance use   family history includes Breast cancer in her paternal aunt; Cancer (age of onset: 69) in her maternal grandmother; Heart disease in her maternal grandfather; Migraines in her mother.   Review of Systems As above  Objective:   Physical Exam BP 104/64   Pulse 66   Ht 5\' 4"  (1.626 m)   Wt 168 lb (76.2 kg)   LMP 01/08/2017   BMI 28.84 kg/m  No acute distress The patient is tender in the right inferior anterior and lateral ribs.

## 2018-05-20 NOTE — Patient Instructions (Addendum)
   Continue your pantoprazole.   Sorry we can't help you feel better, Dr Carlean Purl is going to talk with Dr Glori Bickers.   I appreciate the opportunity to care for you. Silvano Rusk, MD, [facility]

## 2018-05-29 ENCOUNTER — Telehealth: Payer: Self-pay | Admitting: Family Medicine

## 2018-05-29 DIAGNOSIS — Z0000 Encounter for general adult medical examination without abnormal findings: Secondary | ICD-10-CM

## 2018-05-29 DIAGNOSIS — R7309 Other abnormal glucose: Secondary | ICD-10-CM

## 2018-05-29 NOTE — Telephone Encounter (Signed)
-----   Message from Ellamae Sia sent at 05/25/2018 12:01 PM EST ----- Regarding: Lab orders for Monday, 2.17.20 Patient is scheduled for CPX labs, please order future labs, Thanks , Karna Christmas

## 2018-05-30 ENCOUNTER — Other Ambulatory Visit (INDEPENDENT_AMBULATORY_CARE_PROVIDER_SITE_OTHER): Payer: BLUE CROSS/BLUE SHIELD

## 2018-05-30 DIAGNOSIS — Z0000 Encounter for general adult medical examination without abnormal findings: Secondary | ICD-10-CM | POA: Diagnosis not present

## 2018-05-30 DIAGNOSIS — R7309 Other abnormal glucose: Secondary | ICD-10-CM

## 2018-05-30 LAB — LIPID PANEL
Cholesterol: 150 mg/dL (ref 0–200)
HDL: 32.6 mg/dL — ABNORMAL LOW (ref >39.00)
LDL Cholesterol: 99 mg/dL (ref 0–99)
NonHDL: 117.47
Total CHOL/HDL Ratio: 5
Triglycerides: 94 mg/dL (ref 0.0–149.0)
VLDL: 18.8 mg/dL (ref 0.0–40.0)

## 2018-05-30 LAB — COMPREHENSIVE METABOLIC PANEL
ALBUMIN: 4 g/dL (ref 3.5–5.2)
ALT: 20 U/L (ref 0–35)
AST: 20 U/L (ref 0–37)
Alkaline Phosphatase: 86 U/L (ref 39–117)
BUN: 14 mg/dL (ref 6–23)
CHLORIDE: 105 meq/L (ref 96–112)
CO2: 27 mEq/L (ref 19–32)
Calcium: 9.1 mg/dL (ref 8.4–10.5)
Creatinine, Ser: 0.84 mg/dL (ref 0.40–1.20)
GFR: 73.26 mL/min (ref >60.00)
Glucose, Bld: 86 mg/dL (ref 70–99)
Potassium: 4.1 mEq/L (ref 3.5–5.1)
Sodium: 138 mEq/L (ref 135–145)
Total Bilirubin: 0.8 mg/dL (ref 0.2–1.2)
Total Protein: 6.7 g/dL (ref 6.0–8.3)

## 2018-05-30 LAB — CBC WITH DIFFERENTIAL/PLATELET
Basophils Absolute: 0 10*3/uL (ref 0.0–0.1)
Basophils Relative: 0.7 % (ref 0.0–3.0)
Eosinophils Absolute: 0.1 10*3/uL (ref 0.0–0.7)
Eosinophils Relative: 1.3 % (ref 0.0–5.0)
HCT: 43.6 % (ref 36.0–46.0)
HEMOGLOBIN: 14.6 g/dL (ref 12.0–15.0)
Lymphocytes Relative: 24 % (ref 12.0–46.0)
Lymphs Abs: 1.8 10*3/uL (ref 0.7–4.0)
MCHC: 33.5 g/dL (ref 30.0–36.0)
MCV: 92.1 fl (ref 78.0–100.0)
Monocytes Absolute: 0.5 10*3/uL (ref 0.1–1.0)
Monocytes Relative: 6.9 % (ref 3.0–12.0)
Neutro Abs: 4.9 10*3/uL (ref 1.4–7.7)
Neutrophils Relative %: 67.1 % (ref 43.0–77.0)
Platelets: 272 10*3/uL (ref 150.0–400.0)
RBC: 4.73 Mil/uL (ref 3.87–5.11)
RDW: 12.9 % (ref 11.5–15.5)
WBC: 7.3 10*3/uL (ref 4.0–10.5)

## 2018-05-30 LAB — HEMOGLOBIN A1C: Hgb A1c MFr Bld: 5.1 % (ref 4.6–6.5)

## 2018-05-31 LAB — TSH: TSH: 1.64 u[IU]/mL (ref 0.35–4.50)

## 2018-06-08 ENCOUNTER — Encounter: Payer: Self-pay | Admitting: Family Medicine

## 2018-06-08 ENCOUNTER — Ambulatory Visit (INDEPENDENT_AMBULATORY_CARE_PROVIDER_SITE_OTHER): Payer: BLUE CROSS/BLUE SHIELD | Admitting: Family Medicine

## 2018-06-08 ENCOUNTER — Encounter: Payer: BLUE CROSS/BLUE SHIELD | Admitting: Family Medicine

## 2018-06-08 VITALS — BP 108/64 | HR 59 | Temp 97.7°F | Ht 64.25 in | Wt 164.3 lb

## 2018-06-08 DIAGNOSIS — Z0000 Encounter for general adult medical examination without abnormal findings: Secondary | ICD-10-CM | POA: Diagnosis not present

## 2018-06-08 DIAGNOSIS — N951 Menopausal and female climacteric states: Secondary | ICD-10-CM

## 2018-06-08 DIAGNOSIS — E786 Lipoprotein deficiency: Secondary | ICD-10-CM

## 2018-06-08 DIAGNOSIS — R7309 Other abnormal glucose: Secondary | ICD-10-CM | POA: Diagnosis not present

## 2018-06-08 DIAGNOSIS — R1011 Right upper quadrant pain: Secondary | ICD-10-CM

## 2018-06-08 MED ORDER — DICLOFENAC SODIUM 3 % TD GEL: TRANSDERMAL | 3 refills | Status: DC

## 2018-06-08 NOTE — Patient Instructions (Addendum)
Try to get 1200-1500 mg of calcium per day with at least 1000 iu of vitamin D - for bone health   Eat more fish and exercise to raise your good cholesterol (HDL)   I will work on a pain clinic referral for your side pain  Our office will call you   Try the diclofenac gel for your side pain also

## 2018-06-08 NOTE — Assessment & Plan Note (Signed)
Chronic RUQ and R flank pain with negative w/u for GI or urinary cause  Imaging studies in epic  Per GI/Dr Carlean Purl presumed MSK in origin  Pt is interested in pursuing pain clinic referral to see if there are treatment options (? Injection/nerve block) Also given px for diclofenac gel to try  Ref placed

## 2018-06-08 NOTE — Assessment & Plan Note (Signed)
Encouraged inc in exercise and also fish intake /omega 3

## 2018-06-08 NOTE — Progress Notes (Signed)
Subjective:    Patient ID: Jonita Hirota, female    DOB: October 31, 1970, 48 y.o.   MRN: [id number]  HPI Here for health maintenance exam and to review chronic medical problems    Staying very busy with family  Knows she needs to exercise more   Wt Readings from Last 3 Encounters:  06/08/18 164 lb 5 oz (74.5 kg)  05/20/18 168 lb (76.2 kg)  04/18/18 165 lb (74.8 kg)  wants to loose weight -working with someone  Has a treadmill and does some walking (time is the issue)  May be able to walk at the mall at lunch time 30 minutes  27.99 kg/m   Does not eat any dairy products  Does not get enough protein  Gets tired of salad with chicken   Mammogram 3/19 -she has it scheduled for 3/12  Self breast exam - no lumps   Pap 8/18 -neg with neg HPV screen  Had a hysterectomy for fibroids over a year ago  Has not been to gyn since post op check   Is having hot flashes - perimenopause   Tetanus shot 12/14  Flu shot  9/19   Blood pressure BP Readings from Last 3 Encounters:  06/08/18 108/64  05/20/18 104/64  04/18/18 110/70   Pulse Readings from Last 3 Encounters:  06/08/18 (!) 59  05/20/18 66  04/18/18 66   H/o random elevated glucose Lab Results  Component Value Date   HGBA1C 5.1 05/30/2018   Reassuring   Has had a big GI w/u for chronic ruq pain -thought to be not GI related ? If MSK  Dr Carlean Purl disc poss pain clinic for injection / diclofenac gel, neuromodulator  Her R side is hurting on and off  ? What is triggering it   Cholesterol Lab Results  Component Value Date   CHOL 150 05/30/2018   CHOL 161 05/28/2017   CHOL 144 05/25/2016   Lab Results  Component Value Date   HDL 32.60 (L) 05/30/2018   HDL 34.60 (L) 05/28/2017   HDL 42.00 05/25/2016   Lab Results  Component Value Date   LDLCALC 99 05/30/2018   LDLCALC 109 (H) 05/28/2017   LDLCALC 89 05/25/2016   Lab Results  Component Value Date   TRIG 94.0 05/30/2018   TRIG 88.0 05/28/2017   TRIG  63.0 05/25/2016   Lab Results  Component Value Date   CHOLHDL 5 05/30/2018   CHOLHDL 5 05/28/2017   CHOLHDL 3 05/25/2016   No results found for: LDLDIRECT LDL improved  HDL low   Patient Active Problem List   Diagnosis Date Noted  . Perimenopause 06/08/2018  . Low HDL (under 40) 06/08/2018  . [place] intestinal bacterial overgrowth 02/09/2018  . Abdominal bloating   . Cyst of right kidney 06/04/2017  . RUQ abdominal pain 05/31/2017  . Hematuria 05/31/2017  . Elevated glucose level 05/22/2017  . S/P laparoscopic hysterectomy 01/14/2017  . Abdominal pain 07/27/2016  . Daily headache 06/01/2016  . Encounter for routine gynecological examination 05/23/2014  . Burning sensation of mouth 09/12/2013  . Routine general medical examination at a health care facility 01/05/2011  . KIDNEY STONE 03/27/2010  . STRESS REACTION, ACUTE, WITH EMOTIONAL DISTURBANCE 09/13/2007  . HEADACHE 09/13/2007   Past Medical History:  Diagnosis Date  . Anemia   . Atypical moles    abnormal moles removed from neck.  . Chronic right upper quadrant pain   . History of kidney stones   . Hyperemesis gravidarum 1999  .  [place] intestinal bacterial overgrowth 02/09/2018   Past Surgical History:  Procedure Laterality Date  . CHOLECYSTECTOMY  06/1994  . COLONOSCOPY WITH PROPOFOL N/A 07/12/2017   Procedure: COLONOSCOPY WITH PROPOFOL;  Surgeon: Lin Landsman, MD;  Location: [facility] ENDOSCOPY;  Service: Gastroenterology;  Laterality: N/A;  . CYSTOSCOPY N/A 01/14/2017   Procedure: CYSTOSCOPY;  Surgeon: Malachy Mood, MD;  Location: [facility] ORS;  Service: Gynecology;  Laterality: N/A;  . ESOPHAGOGASTRODUODENOSCOPY (EGD) WITH PROPOFOL N/A 07/12/2017   Procedure: ESOPHAGOGASTRODUODENOSCOPY (EGD) WITH PROPOFOL;  Surgeon: Lin Landsman, MD;  Location: [facility] ENDOSCOPY;  Service: Gastroenterology;  Laterality: N/A;  . KIDNEY STONE SURGERY    . LITHOTRIPSY  03/2006  . TONSILECTOMY, ADENOIDECTOMY, BILATERAL  MYRINGOTOMY AND TUBES     myringotomy tubes  . TOTAL LAPAROSCOPIC HYSTERECTOMY WITH SALPINGECTOMY Bilateral 01/14/2017   Procedure: HYSTERECTOMY TOTAL LAPAROSCOPIC WITH SALPINGECTOMY;  Surgeon: Malachy Mood, MD;  Location: [facility] ORS;  Service: Gynecology;  Laterality: Bilateral;   Social History   Tobacco Use  . Smoking status: Never Smoker  . Smokeless tobacco: Never Used  Substance Use Topics  . Alcohol use: No    Alcohol/week: 0.0 standard drinks  . Drug use: No   Family History  Problem Relation Age of Onset  . Migraines Mother   . Breast cancer Paternal Aunt   . Cancer Maternal Grandmother 70       brain tumor/breast cancer  . Heart disease Maternal Grandfather        heart problems  . Colon cancer Neg Hx   . Esophageal cancer Neg Hx   . Liver disease Neg Hx   . Pancreatic cancer Neg Hx   . Stomach cancer Neg Hx   . Inflammatory bowel disease Neg Hx    Allergies  Allergen Reactions  . Amoxicillin Anaphylaxis  . Bee Venom Anaphylaxis  . Sulfa Antibiotics Anaphylaxis  . Sulfonamide Derivatives Anaphylaxis    Lungs close up  . Penicillins Rash    Has patient had a PCN reaction causing immediate rash, facial/tongue/throat swelling, SOB or lightheadedness with hypotension: No Has patient had a PCN reaction causing severe rash involving mucus membranes or skin necrosis: No Has patient had a PCN reaction that required hospitalization: No Has patient had a PCN reaction occurring within the last 10 years: No If all of the above answers are "NO", then may proceed with Cephalosporin use.    No current outpatient medications on file prior to visit.   No current facility-administered medications on file prior to visit.     Review of Systems  Constitutional: Negative for activity change, appetite change, fatigue, fever and unexpected weight change.  HENT: Negative for congestion, ear pain, rhinorrhea, sinus pressure and sore throat.   Eyes: Negative for pain, redness and  visual disturbance.  Respiratory: Negative for cough, shortness of breath and wheezing.   Cardiovascular: Negative for chest pain and palpitations.  Gastrointestinal: Positive for abdominal pain. Negative for abdominal distention, anal bleeding, blood in stool, constipation, diarrhea, nausea, rectal pain and vomiting.  Endocrine: Negative for polydipsia and polyuria.  Genitourinary: Negative for dysuria, frequency and urgency.  Musculoskeletal: Negative for arthralgias, back pain and myalgias.       R upper abd and side pain   Skin: Negative for pallor and rash.  Allergic/Immunologic: Negative for environmental allergies.  Neurological: Negative for dizziness, syncope and headaches.       L hand occ goes to sleep when driving   Hematological: Negative for adenopathy. Does not bruise/bleed easily.  Psychiatric/Behavioral: Negative  for decreased concentration and dysphoric mood. The patient is not nervous/anxious.        Objective:   Physical Exam Constitutional:      General: She is not in acute distress.    Appearance: Normal appearance. She is well-developed. She is not ill-appearing.     Comments: overwt and well app  HENT:     Head: Normocephalic and atraumatic.     Right Ear: Tympanic membrane, ear canal and external ear normal.     Left Ear: Tympanic membrane, ear canal and external ear normal.     Nose: Nose normal.     Mouth/Throat:     Mouth: Mucous membranes are moist.     Pharynx: Oropharynx is clear.  Eyes:     General: No scleral icterus.    Conjunctiva/sclera: Conjunctivae normal.     Pupils: Pupils are equal, round, and reactive to light.  Neck:     Musculoskeletal: Normal range of motion and neck supple.     Thyroid: No thyromegaly.     Vascular: No carotid bruit or JVD.  Cardiovascular:     Rate and Rhythm: Regular rhythm. Bradycardia present.     Pulses: Normal pulses.     Heart sounds: Normal heart sounds. No gallop.   Pulmonary:     Effort: Pulmonary  effort is normal. No respiratory distress.     Breath sounds: Normal breath sounds. No wheezing.  Chest:     Chest wall: No tenderness.  Abdominal:     General: Bowel sounds are normal. There is no distension or abdominal bruit.     Palpations: Abdomen is soft. There is no hepatomegaly, splenomegaly, mass or pulsatile mass.     Tenderness: There is abdominal tenderness in the right upper quadrant. There is no guarding. Negative signs include Murphy's sign.     Hernia: No hernia is present.     Comments: Mild tenderness over RUQ (neg murphy sign) as well as lower ribs and flank No crepitus or swelling   Genitourinary:    Comments: Breast exam: No mass, nodules, thickening, tenderness, bulging, retraction, inflamation, nipple discharge or skin changes noted.  No axillary or clavicular LA.    Dense tissue  Musculoskeletal: Normal range of motion.        General: No tenderness.     Right lower leg: No edema.     Left lower leg: No edema.  Lymphadenopathy:     Cervical: No cervical adenopathy.  Skin:    General: Skin is warm and dry.     Capillary Refill: Capillary refill takes less than 2 seconds.     Coloration: Skin is not pale.     Findings: No erythema or rash.     Comments: Solar lentigines diffusely  Some mild rosacea of cheeks  Neurological:     General: No focal deficit present.     Mental Status: She is alert.     Cranial Nerves: No cranial nerve deficit.     Motor: No abnormal muscle tone.     Coordination: Coordination normal.     Deep Tendon Reflexes: Reflexes are normal and symmetric.  Psychiatric:        Mood and Affect: Mood normal.           Assessment & Plan:   Problem List Items Addressed This Visit      Other   Routine general medical examination at a health care facility - Primary    Reviewed health habits including diet and exercise and  skin cancer prevention Reviewed appropriate screening tests for age  Also reviewed health mt list, fam hx and  immunization status , as well as social and family history   See HPI Labs reviewed Enc to keep working with health coach on wt loss (harder in perimenopause)  Finding time to exercise -difficult as well  Mammogram is already scheduled  Disc imp of ca and D for bone health      Elevated glucose level    Lab Results  Component Value Date   HGBA1C 5.1 05/30/2018   Reassuring disc imp of low glycemic diet and wt loss to prevent DM2       RUQ abdominal pain    Chronic RUQ and R flank pain with negative w/u for GI or urinary cause  Imaging studies in epic  Per GI/Dr Carlean Purl presumed MSK in origin  Pt is interested in pursuing pain clinic referral to see if there are treatment options (? Injection/nerve block) Also given px for diclofenac gel to try  Ref placed      Relevant Orders   Ambulatory referral to Pain Clinic   Perimenopause   Low HDL (under 40)    Encouraged inc in exercise and also fish intake /omega 3

## 2018-06-08 NOTE — Assessment & Plan Note (Signed)
Lab Results  Component Value Date   HGBA1C 5.1 05/30/2018   Reassuring disc imp of low glycemic diet and wt loss to prevent DM2

## 2018-06-08 NOTE — Assessment & Plan Note (Signed)
Reviewed health habits including diet and exercise and skin cancer prevention Reviewed appropriate screening tests for age  Also reviewed health mt list, fam hx and immunization status , as well as social and family history   See HPI Labs reviewed Enc to keep working with health coach on wt loss (harder in perimenopause)  Finding time to exercise -difficult as well  Mammogram is already scheduled  Disc imp of ca and D for bone health

## 2018-06-17 ENCOUNTER — Telehealth: Payer: Self-pay

## 2018-06-17 NOTE — Telephone Encounter (Signed)
Left message for patient to call back in regards to her referral

## 2018-06-22 IMAGING — CT CT ABD-PELV W/ CM
2 of 5 series · 15 of 46 positions shown, 17 images · IV contrast (APPLIED)
Comparison: CT 08/03/2016

CLINICAL DATA: Right lower quadrant pain.  Vomiting.

EXAM:
CT ABDOMEN AND PELVIS WITH CONTRAST
TECHNIQUE: Multidetector CT imaging of the abdomen and pelvis was performed
using the standard protocol following bolus administration of
intravenous contrast.
CONTRAST:  100mL 816FH4-2HH IOPAMIDOL (816FH4-2HH) INJECTION 61%

[Series 2: routine abd/pel with · axial · 0.74mm/px · z∈[-1005,-580]mm · 12 of 95 slices shown, 14 images]
[im 5/95  soft-tissue]
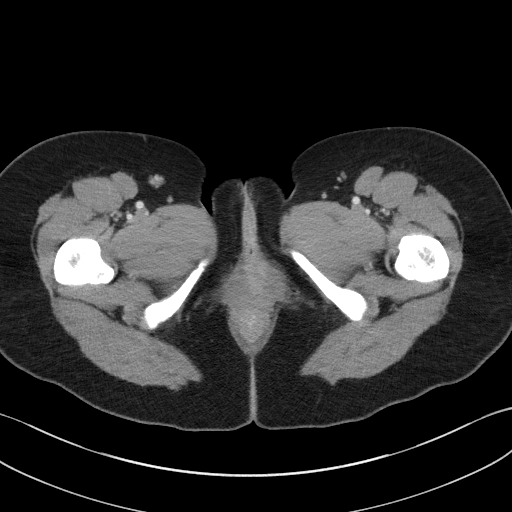
[im 5/95  bone]
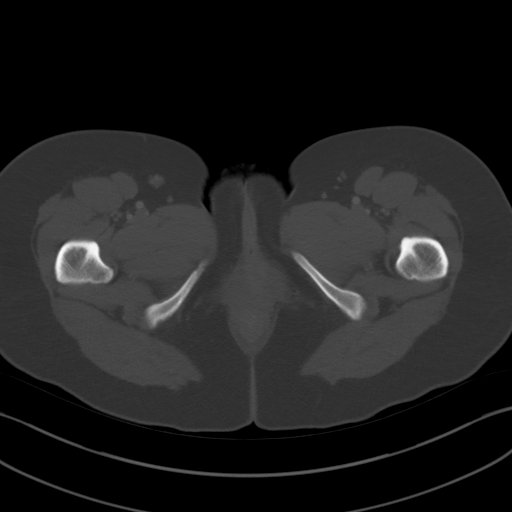
[im 14/95  soft-tissue]
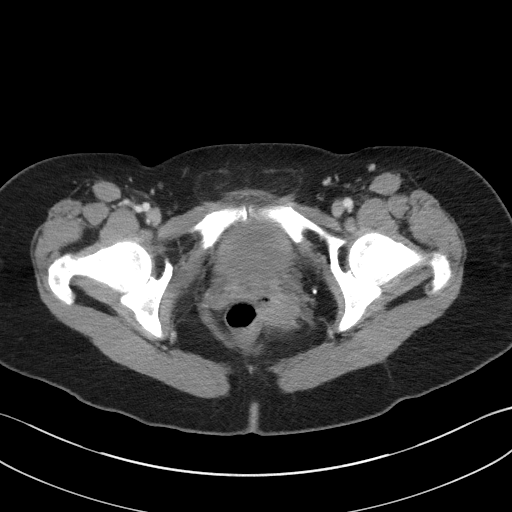
[im 23/95  soft-tissue]
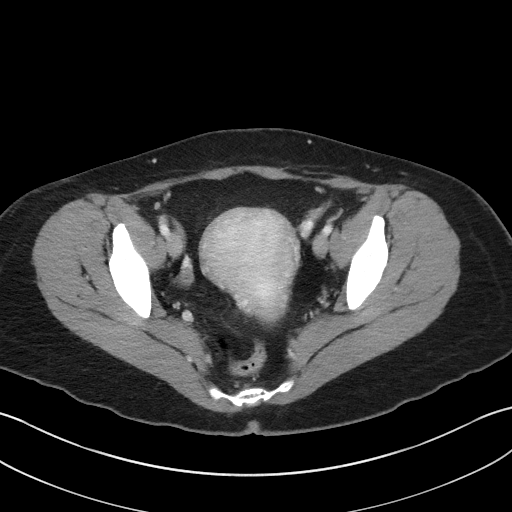
[im 27/95  soft-tissue]
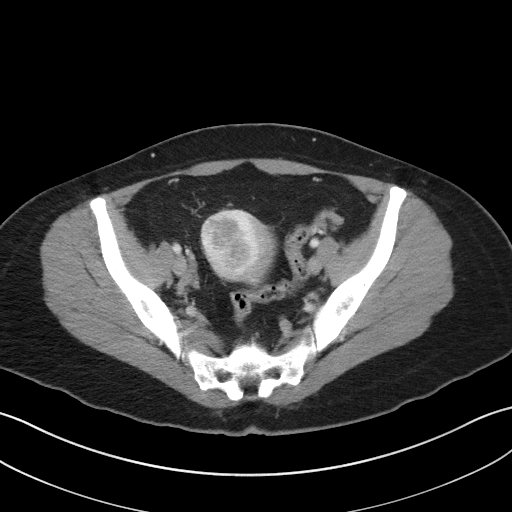
[im 36/95  soft-tissue]
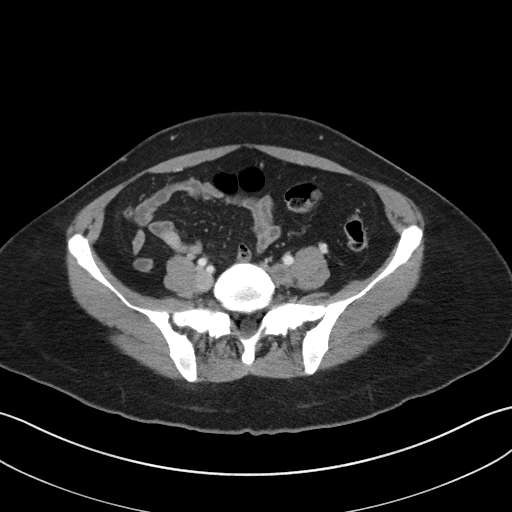
[im 45/95  soft-tissue]
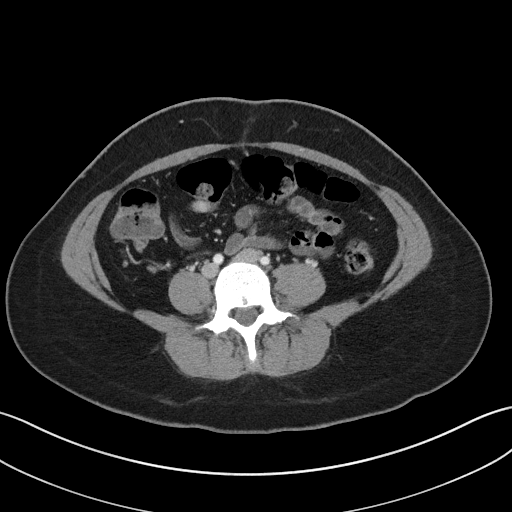
[im 50/95  soft-tissue]
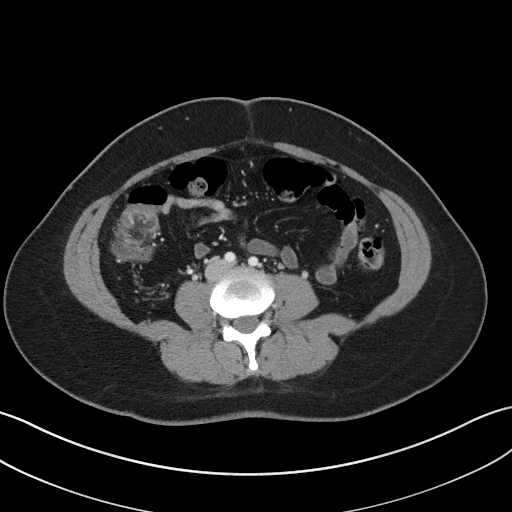
[im 59/95  soft-tissue]
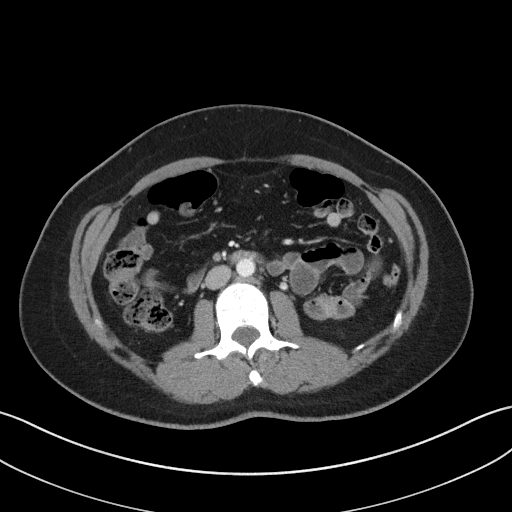
[im 68/95  soft-tissue]
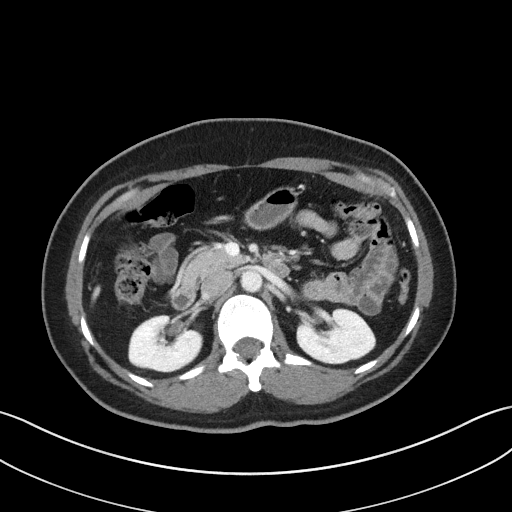
[im 68/95  bone]
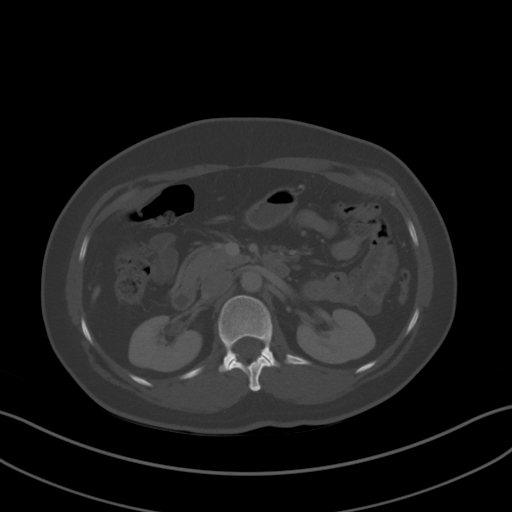
[im 72/95  soft-tissue]
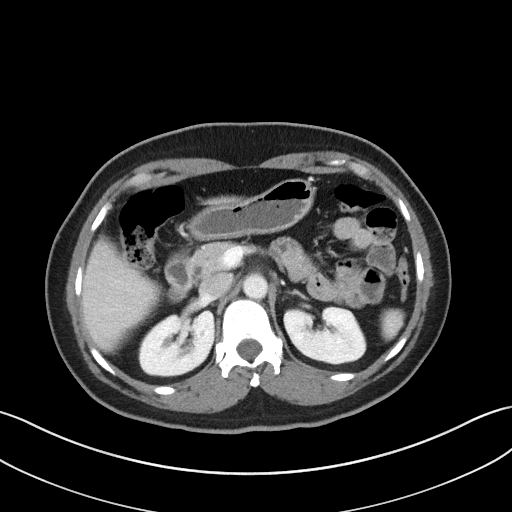
[im 81/95  soft-tissue]
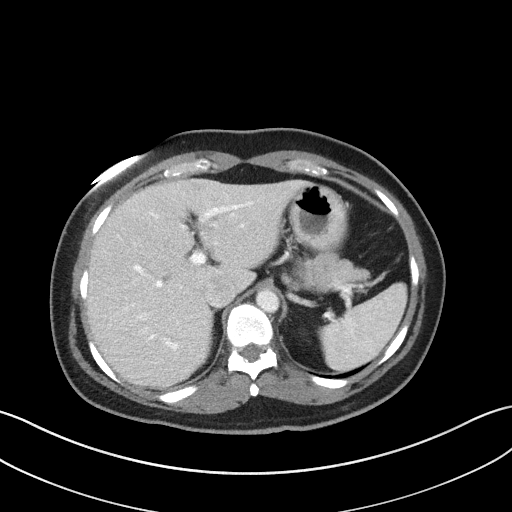
[im 90/95  soft-tissue]
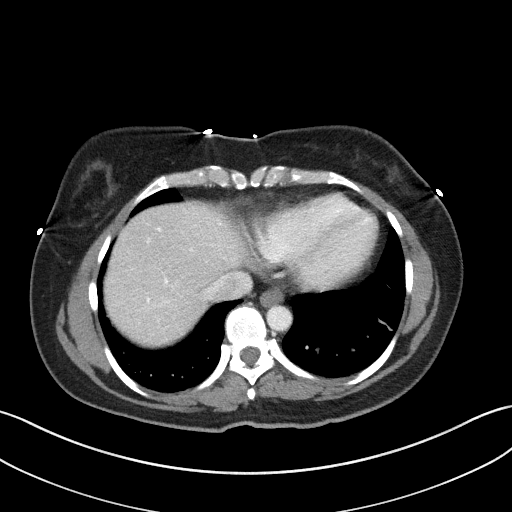

[Series 5: coronal st · coronal · 0.68mm/px · 3 of 79 slices shown]
[im 27/79  soft-tissue]
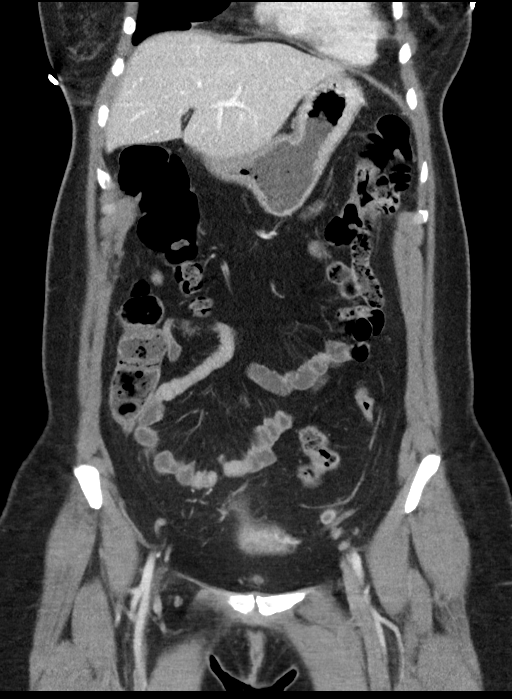
[im 35/79  soft-tissue]
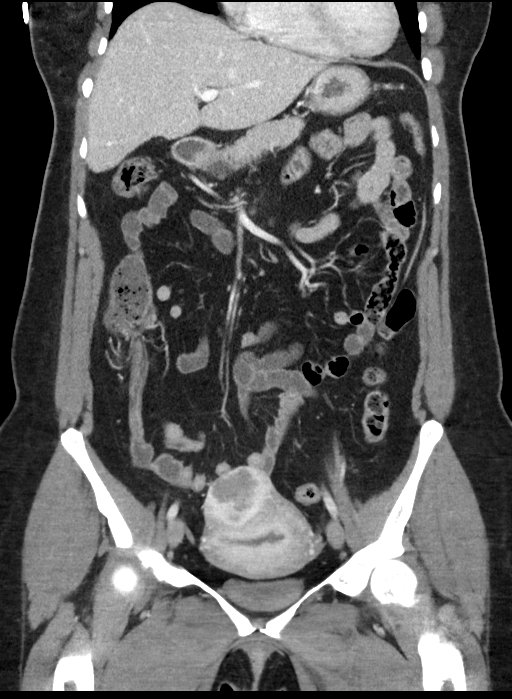
[im 44/79  soft-tissue]
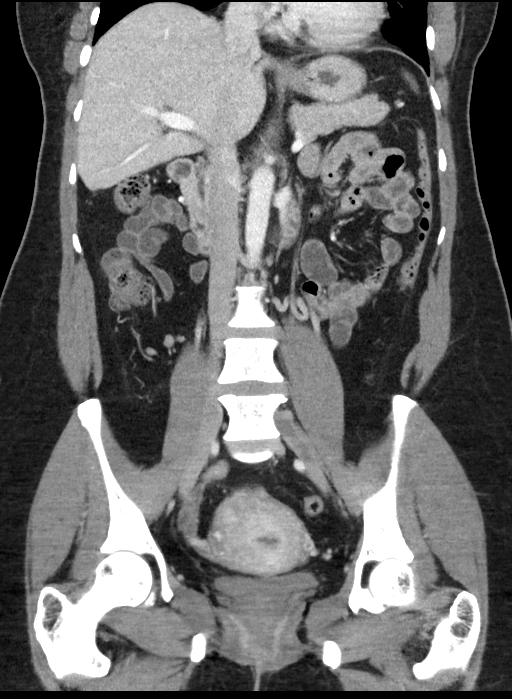

[15 of 46 positions shown; findings below may reference images not displayed]

FINDINGS: Lower chest: Linear atelectasis in the lung bases. No pleural fluid.
Heart is normal in size. Minimal thickening the distal esophagus.

Hepatobiliary: Focal fatty infiltration adjacent to the falciform
ligament. No focal lesion. Postcholecystectomy with unchanged
biliary prominence, likely sequela of prior cholecystectomy.

Pancreas: No ductal dilatation or inflammation.

Spleen: Normal in size without focal abnormality.

Adrenals/Urinary Tract: Normal adrenal glands. No hydronephrosis or
perinephric edema. Nonobstructing stones in left kidney again seen.
Simple cyst in the lower right kidney, unchanged from prior. Urinary
bladder is decompressed.

Stomach/Bowel: Possible thickening of the distal esophagus. Stomach
is minimally distended. No bowel wall thickening, inflammation or
obstruction. Normal appendix.

Vascular/Lymphatic: Prominent left ovarian veins. Normal caliber
abdominal aorta. No abdominal or pelvic adenopathy.

Reproductive: Posterior fundal fibroid measures 4.8 cm with
peripheral enhancement and central hypodensity suggesting necrosis.
Prominent periuterine and adnexal vascularity with dilatation of the
left ovarian vein. Ovaries are normal in size. No adnexal mass.

Other: Trace free fluid in the pelvis. No free air. No upper
abdominal ascites. Tiny fat containing umbilical hernia.

Musculoskeletal: There are no acute or suspicious osseous
abnormalities.
IMPRESSION: 1. Uterine fibroid measures 4.7 cm and is likely necrotic. Prominent
periuterine and adnexal vascularity suggesting pelvic congestion
syndrome.
2. Mild distal esophageal wall thickening, can be seen with reflux
or esophagitis.
3. Normal appendix.

## 2018-06-23 ENCOUNTER — Ambulatory Visit
Admission: RE | Admit: 2018-06-23 | Discharge: 2018-06-23 | Disposition: A | Discharge status: Home / Self Care | Payer: BLUE CROSS/BLUE SHIELD | Source: Ambulatory Visit | Attending: Family Medicine | Admitting: Family Medicine

## 2018-06-23 ENCOUNTER — Other Ambulatory Visit: Payer: Self-pay

## 2018-06-23 DIAGNOSIS — Z1231 Encounter for screening mammogram for malignant neoplasm of breast: Secondary | ICD-10-CM | POA: Insufficient documentation

## 2018-06-28 ENCOUNTER — Ambulatory Visit (INDEPENDENT_AMBULATORY_CARE_PROVIDER_SITE_OTHER): Payer: BLUE CROSS/BLUE SHIELD | Admitting: Obstetrics and Gynecology

## 2018-06-28 ENCOUNTER — Other Ambulatory Visit: Payer: Self-pay

## 2018-06-28 ENCOUNTER — Encounter: Payer: Self-pay | Admitting: Obstetrics and Gynecology

## 2018-06-28 VITALS — BP 110/60 | HR 78 | Ht 64.0 in | Wt 166.0 lb

## 2018-06-28 DIAGNOSIS — B9689 Other specified bacterial agents as the cause of diseases classified elsewhere: Secondary | ICD-10-CM

## 2018-06-28 DIAGNOSIS — N951 Menopausal and female climacteric states: Secondary | ICD-10-CM | POA: Diagnosis not present

## 2018-06-28 DIAGNOSIS — Z01419 Encounter for gynecological examination (general) (routine) without abnormal findings: Secondary | ICD-10-CM

## 2018-06-28 DIAGNOSIS — Z1239 Encounter for other screening for malignant neoplasm of breast: Secondary | ICD-10-CM

## 2018-06-28 DIAGNOSIS — N760 Acute vaginitis: Secondary | ICD-10-CM

## 2018-06-28 MED ORDER — METRONIDAZOLE 500 MG PO TABS: 500.0000 mg | ORAL_TABLET | Freq: Two times a day (BID) | ORAL | 0 refills | Status: AC

## 2018-06-28 NOTE — Patient Instructions (Signed)
Bacterial Vaginosis    Bacterial vaginosis is a vaginal infection that occurs when the normal balance of bacteria in the vagina is disrupted. It results from an overgrowth of certain bacteria. This is the most common vaginal infection among women ages 15-44.  Because bacterial vaginosis increases your risk for STIs (sexually transmitted infections), getting treated can help reduce your risk for chlamydia, gonorrhea, herpes, and HIV (human immunodeficiency virus). Treatment is also important for preventing complications in pregnant women, because this condition can cause an early (premature) delivery.  What are the causes?  This condition is caused by an increase in harmful bacteria that are normally present in Nay amounts in the vagina. However, the reason that the condition develops is not fully understood.  What increases the risk?  The following factors may make you more likely to develop this condition:  · Having a new sexual partner or multiple sexual partners.  · Having unprotected sex.  · Douching.  · Having an intrauterine device (IUD).  · Smoking.  · Drug and alcohol abuse.  · Taking certain antibiotic medicines.  · Being pregnant.  You cannot get bacterial vaginosis from toilet seats, bedding, swimming pools, or contact with objects around you.  What are the signs or symptoms?  Symptoms of this condition include:  · Grey or white vaginal discharge. The discharge can also be watery or foamy.  · A fish-like odor with discharge, especially after sexual intercourse or during menstruation.  · Itching in and around the vagina.  · Burning or pain with urination.  Some women with bacterial vaginosis have no signs or symptoms.  How is this diagnosed?  This condition is diagnosed based on:  · Your medical history.  · A physical exam of the vagina.  · Testing a sample of vaginal fluid under a microscope to look for a large amount of bad bacteria or abnormal cells. Your health care provider may use a cotton swab or  a Palladino wooden spatula to collect the sample.  How is this treated?  This condition is treated with antibiotics. These may be given as a pill, a vaginal cream, or a medicine that is put into the vagina (suppository). If the condition comes back after treatment, a second round of antibiotics may be needed.  Follow these instructions at home:  Medicines  · Take over-the-counter and prescription medicines only as told by your health care provider.  · Take or use your antibiotic as told by your health care provider. Do not stop taking or using the antibiotic even if you start to feel better.  General instructions  · If you have a female sexual partner, tell her that you have a vaginal infection. She should see her health care provider and be treated if she has symptoms. If you have a female sexual partner, he does not need treatment.  · During treatment:  ? Avoid sexual activity until you finish treatment.  ? Do not douche.  ? Avoid alcohol as directed by your health care provider.  ? Avoid breastfeeding as directed by your health care provider.  · Drink enough water and fluids to keep your urine clear or pale yellow.  · Keep the area around your vagina and rectum clean.  ? Wash the area daily with warm water.  ? Wipe yourself from front to back after using the toilet.  · Keep all follow-up visits as told by your health care provider. This is important.  How is this prevented?  · Do not   douche.  · Wash the outside of your vagina with warm water only.  · Use protection when having sex. This includes latex condoms and dental dams.  · Limit how many sexual partners you have. To help prevent bacterial vaginosis, it is best to have sex with just one partner (monogamous).  · Make sure you and your sexual partner are tested for STIs.  · Wear cotton or cotton-lined underwear.  · Avoid wearing tight pants and pantyhose, especially during summer.  · Limit the amount of alcohol that you drink.  · Do not use any products that contain  nicotine or tobacco, such as cigarettes and e-cigarettes. If you need help quitting, ask your health care provider.  · Do not use illegal drugs.  Where to find more information  · Centers for Disease Control and Prevention: www.cdc.gov/std  · American Sexual Health Association (ASHA): www.ashastd.org  · U.[place]. Department of Health and Human Services, Office on Women's Health: www.womenshealth.gov/ or https://www.womenshealth.gov/a-z-topics/bacterial-vaginosis  Contact a health care provider if:  · Your symptoms do not improve, even after treatment.  · You have more discharge or pain when urinating.  · You have a fever.  · You have pain in your abdomen.  · You have pain during sex.  · You have vaginal bleeding between periods.  Summary  · Bacterial vaginosis is a vaginal infection that occurs when the normal balance of bacteria in the vagina is disrupted.  · Because bacterial vaginosis increases your risk for STIs (sexually transmitted infections), getting treated can help reduce your risk for chlamydia, gonorrhea, herpes, and HIV (human immunodeficiency virus). Treatment is also important for preventing complications in pregnant women, because the condition can cause an early (premature) delivery.  · This condition is treated with antibiotic medicines. These may be given as a pill, a vaginal cream, or a medicine that is put into the vagina (suppository).  This information is not intended to replace advice given to you by your health care provider. Make sure you discuss any questions you have with your health care provider.  Document Released: 03/30/2005 Document Revised: 08/03/2016 Document Reviewed: 12/14/2015  [employer]vier Interactive Patient Education © 2019 [employer]

## 2018-06-28 NOTE — Progress Notes (Signed)
Gynecology Annual Exam  PCP: Abner Greenspan, MD  Chief Complaint:  Chief Complaint  Patient presents with  . Gynecologic Exam    discharge w/odor    History of Present Illness: Patient is a 48 y.o. [id number] presents for annual exam. The patient has no complaints today.   LMP: Patient's last menstrual period was 01/08/2017.  The patient is sexually active. She currently uses status post hysterectomy for contraception. She denies dyspareunia.  The patient does perform self breast exams.  There is no notable family history of breast or ovarian cancer in her family.  The patient wears seatbelts: yes.   The patient has regular exercise: not asked.    The patient denies current symptoms of depression.    Review of Systems: ROS  Past Medical History:  Past Medical History:  Diagnosis Date  . Anemia   . Atypical moles    abnormal moles removed from neck.  . Chronic right upper quadrant pain   . History of kidney stones   . Hyperemesis gravidarum 1999  . [place] intestinal bacterial overgrowth 02/09/2018    Past Surgical History:  Past Surgical History:  Procedure Laterality Date  . CHOLECYSTECTOMY  06/1994  . COLONOSCOPY WITH PROPOFOL N/A 07/12/2017   Procedure: COLONOSCOPY WITH PROPOFOL;  Surgeon: Lin Landsman, MD;  Location: [facility] ENDOSCOPY;  Service: Gastroenterology;  Laterality: N/A;  . CYSTOSCOPY N/A 01/14/2017   Procedure: CYSTOSCOPY;  Surgeon: Malachy Mood, MD;  Location: [facility] ORS;  Service: Gynecology;  Laterality: N/A;  . ESOPHAGOGASTRODUODENOSCOPY (EGD) WITH PROPOFOL N/A 07/12/2017   Procedure: ESOPHAGOGASTRODUODENOSCOPY (EGD) WITH PROPOFOL;  Surgeon: Lin Landsman, MD;  Location: [facility] ENDOSCOPY;  Service: Gastroenterology;  Laterality: N/A;  . KIDNEY STONE SURGERY    . LITHOTRIPSY  03/2006  . TONSILECTOMY, ADENOIDECTOMY, BILATERAL MYRINGOTOMY AND TUBES     myringotomy tubes  . TOTAL LAPAROSCOPIC HYSTERECTOMY WITH SALPINGECTOMY Bilateral 01/14/2017   Procedure: HYSTERECTOMY TOTAL LAPAROSCOPIC WITH SALPINGECTOMY;  Surgeon: Malachy Mood, MD;  Location: [facility] ORS;  Service: Gynecology;  Laterality: Bilateral;    Gynecologic History:  Patient's last menstrual period was 01/08/2017. Contraception: status post hysterectomy Last Pap: Results were: N/A status post hysterectomy Last mammogram: 06/23/2018 Results were: BI-RAD I  Obstetric History: [id number]  Family History:  Family History  Problem Relation Age of Onset  . Migraines Mother   . Breast cancer Paternal Aunt        Contact  . Cancer Maternal Grandmother 38       brain tumor/breast cancer  . Breast cancer Maternal Grandmother   . Heart disease Maternal Grandfather        heart problems  . Colon cancer Neg Hx   . Esophageal cancer Neg Hx   . Liver disease Neg Hx   . Pancreatic cancer Neg Hx   . Stomach cancer Neg Hx   . Inflammatory bowel disease Neg Hx     Social History:  Social History   Socioeconomic History  . Marital status: Married    Spouse name: Not on file  . Number of children: 2  . Years of education: Not on file  . Highest education level: Not on file  Occupational History  . Occupation: Optician, dispensing: [employer]  Social Needs  . Financial resource strain: Not on file  . Food insecurity:    Worry: Not on file    Inability: Not on file  . Transportation needs:    Medical: Not on file    Non-medical: Not  on file  Tobacco Use  . Smoking status: Never Smoker  . Smokeless tobacco: Never Used  Substance and Sexual Activity  . Alcohol use: No    Alcohol/week: 0.0 standard drinks  . Drug use: No  . Sexual activity: Yes    Birth control/protection: None  Lifestyle  . Physical activity:    Days per week: 3 days    Minutes per session: 30 min  . Stress: Only a little  Relationships  . Social connections:    Talks on phone: More than three times a week    Gets together: More than three times a week    Attends religious  service: More than 4 times per year    Active member of club or organization: No    Attends meetings of clubs or organizations: Never    Relationship status: Married  . Intimate partner violence:    Fear of current or ex partner: No    Emotionally abused: No    Physically abused: No    Forced sexual activity: No  Other Topics Concern  . Not on file  Social History Narrative   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, she is an Theatre manager   Rare caffeine and never smoker no alcohol no drug or substance use    Allergies:  Allergies  Allergen Reactions  . Amoxicillin Anaphylaxis  . Bee Venom Anaphylaxis  . Sulfa Antibiotics Anaphylaxis  . Sulfonamide Derivatives Anaphylaxis    Lungs close up  . Penicillins Rash    Has patient had a PCN reaction causing immediate rash, facial/tongue/throat swelling, SOB or lightheadedness with hypotension: No Has patient had a PCN reaction causing severe rash involving mucus membranes or skin necrosis: No Has patient had a PCN reaction that required hospitalization: No Has patient had a PCN reaction occurring within the last 10 years: No If all of the above answers are "NO", then may proceed with Cephalosporin use.     Medications: Prior to Admission medications   Not on File    Physical Exam Vitals: Blood pressure 110/60, pulse 78, height 5\' 4"  (1.626 m), weight 166 lb (75.3 kg), last menstrual period 01/08/2017.  General: NAD HEENT: normocephalic, anicteric Thyroid: no enlargement, no palpable nodules Pulmonary: No increased work of breathing, CTAB Cardiovascular: RRR, distal pulses 2+ Breast: Breast symmetrical, no tenderness, no palpable nodules or masses, no skin or nipple retraction present, no nipple discharge.  No axillary or supraclavicular lymphadenopathy. Abdomen: NABS, soft, non-tender, non-distended.  Umbilicus without lesions.  No hepatomegaly, splenomegaly or masses palpable. No evidence of hernia   Genitourinary:  External: Normal external female genitalia.  Normal urethral meatus, normal Bartholin's and Skene's glands.    Vagina: Normal vaginal mucosa, no evidence of prolapse.    Cervix: surgically absent  Uterus: surgically absent  Adnexa: ovaries non-enlarged, no adnexal masses  Rectal: deferred  Lymphatic: no evidence of inguinal lymphadenopathy Extremities: no edema, erythema, or tenderness Neurologic: Grossly intact Psychiatric: mood appropriate, affect full  Female chaperone present for pelvic and breast  portions of the physical exam    Assessment: 48 y.o. [id number] routine annual exam  Plan: Problem List Items Addressed This Visit      Other   Encounter for routine gynecological examination - Primary   Relevant Orders   Follicle stimulating hormone   Estradiol    Other Visit Diagnoses    Breast screening       Perimenopausal vasomotor symptoms       Relevant  Orders   Follicle stimulating hormone   Estradiol   Bacterial vaginosis       Relevant Medications   metroNIDAZOLE (FLAGYL) 500 MG tablet      1) Mammogram - recommend yearly screening mammogram.  Mammogram Is up to date   2) STI screening  was notoffered and therefore not obtained  3) ASCCP guidelines and rational discussed.  Patient opts for discontinue secondary to prior hysterectomy screening interval  4) Contraception - the patient is currently using  status post hysterectomy.  She is not currently in need of contraception secondary to being sterile  5) Colonoscopy -- Screening recommended starting at age 53 for average risk individuals, age 36 for individuals deemed at increased risk (including African Americans) and recommended to continue until age 70.  For patient age 54-85 individualized approach is recommended.  Gold standard screening is via colonoscopy, Cologuard screening is an acceptable alternative for patient unwilling or unable to undergo colonoscopy.  "Colorectal cancer screening  for average?risk adults: 2018 guideline update from the American Cancer Society"CA: A Cancer Journal for Clinicians: Sep 09, 2016   6) Routine healthcare maintenance including cholesterol, diabetes screening discussed managed by PCP  7) Vasomotor symptoms - will obtain FSH/estradiol given prior hysterectomy to see if menopausal.  8) Return in about 1 year (around 06/28/2019) for annual.   Malachy Mood, MD, [place], [facility] 06/28/2018, 2:04 PM

## 2018-06-29 LAB — ESTRADIOL: Estradiol: 82.6 pg/mL

## 2018-06-29 LAB — FOLLICLE STIMULATING HORMONE: FSH: 6.5 m[IU]/mL

## 2018-11-02 ENCOUNTER — Other Ambulatory Visit: Payer: Self-pay | Admitting: Obstetrics and Gynecology

## 2018-11-02 MED ORDER — METRONIDAZOLE 500 MG PO TABS: 500.0000 mg | ORAL_TABLET | Freq: Two times a day (BID) | ORAL | 0 refills | Status: DC

## 2018-11-17 ENCOUNTER — Telehealth: Payer: Self-pay | Admitting: Obstetrics and Gynecology

## 2018-11-17 ENCOUNTER — Ambulatory Visit (INDEPENDENT_AMBULATORY_CARE_PROVIDER_SITE_OTHER): Payer: BC Managed Care – PPO | Admitting: Obstetrics and Gynecology

## 2018-11-17 ENCOUNTER — Encounter: Payer: Self-pay | Admitting: Obstetrics and Gynecology

## 2018-11-17 ENCOUNTER — Other Ambulatory Visit: Payer: Self-pay

## 2018-11-17 VITALS — BP 122/82 | HR 62 | Ht 64.0 in | Wt 159.0 lb

## 2018-11-17 DIAGNOSIS — K141 Geographic tongue: Secondary | ICD-10-CM | POA: Diagnosis not present

## 2018-11-17 MED ORDER — FLUCONAZOLE 150 MG PO TABS: 150.0000 mg | ORAL_TABLET | Freq: Once | ORAL | 0 refills | Status: AC

## 2018-11-17 NOTE — Telephone Encounter (Signed)
I spoke to pharmacist. Directions for Diflucan state 1 pill no refills but she can take another one 3 days later if needed. His question was can he dispense 2 pills now in case she needs it in 3 days. That way she will have on hand. I ok'd for him to dispense 2 pills w/no refills. KJ CMA AMS aware

## 2018-11-17 NOTE — Telephone Encounter (Signed)
Mark with [facility] in [employer] has a question about the RX that was sent in. Please call [phone]

## 2018-11-17 NOTE — Progress Notes (Signed)
Obstetrics & Gynecology Office Visit   Chief Complaint:  Chief Complaint  Patient presents with  . Follow-up    Pt has thrush on tongue    History of Present Illness: 48 y.o..G2P2002 presenting with concerns for thrush following flagyl treatment course for BV.  She reports white film/lesions noted on tongue.  No hand or foot lesions.  Denies sore throat or pain with swallowing.  On no imunosuppresive drugs.  Low risk HIV.  Does report gradual improvement since initial onset a few days ago.  Review of Systems: Review of Systems  Constitutional: Negative.   HENT: Negative for sore throat.   Skin: Positive for rash. Negative for itching.     Past Medical History:  Past Medical History:  Diagnosis Date  . Anemia   . Atypical moles    abnormal moles removed from neck.  . Chronic right upper quadrant pain   . History of kidney stones   . Hyperemesis gravidarum 1999  . [place] intestinal bacterial overgrowth 02/09/2018    Past Surgical History:  Past Surgical History:  Procedure Laterality Date  . CHOLECYSTECTOMY  06/1994  . COLONOSCOPY WITH PROPOFOL N/A 07/12/2017   Procedure: COLONOSCOPY WITH PROPOFOL;  Surgeon: Lin Landsman, MD;  Location: [facility] ENDOSCOPY;  Service: Gastroenterology;  Laterality: N/A;  . CYSTOSCOPY N/A 01/14/2017   Procedure: CYSTOSCOPY;  Surgeon: Malachy Mood, MD;  Location: [facility] ORS;  Service: Gynecology;  Laterality: N/A;  . ESOPHAGOGASTRODUODENOSCOPY (EGD) WITH PROPOFOL N/A 07/12/2017   Procedure: ESOPHAGOGASTRODUODENOSCOPY (EGD) WITH PROPOFOL;  Surgeon: Lin Landsman, MD;  Location: [facility] ENDOSCOPY;  Service: Gastroenterology;  Laterality: N/A;  . KIDNEY STONE SURGERY    . LITHOTRIPSY  03/2006  . TONSILECTOMY, ADENOIDECTOMY, BILATERAL MYRINGOTOMY AND TUBES     myringotomy tubes  . TOTAL LAPAROSCOPIC HYSTERECTOMY WITH SALPINGECTOMY Bilateral 01/14/2017   Procedure: HYSTERECTOMY TOTAL LAPAROSCOPIC WITH SALPINGECTOMY;  Surgeon: Malachy Mood, MD;  Location: [facility] ORS;  Service: Gynecology;  Laterality: Bilateral;    Gynecologic History: Patient's last menstrual period was 01/08/2017.  Obstetric History: [id number]  Family History:  Family History  Problem Relation Age of Onset  . Migraines Mother   . Breast cancer Paternal Aunt        Contact  . Cancer Maternal Grandmother 23       brain tumor/breast cancer  . Breast cancer Maternal Grandmother   . Heart disease Maternal Grandfather        heart problems  . Colon cancer Neg Hx   . Esophageal cancer Neg Hx   . Liver disease Neg Hx   . Pancreatic cancer Neg Hx   . Stomach cancer Neg Hx   . Inflammatory bowel disease Neg Hx     Social History:  Social History   Socioeconomic History  . Marital status: Married    Spouse name: Not on file  . Number of children: 2  . Years of education: Not on file  . Highest education level: Not on file  Occupational History  . Occupation: Optician, dispensing: [employer]  Social Needs  . Financial resource strain: Not on file  . Food insecurity    Worry: Not on file    Inability: Not on file  . Transportation needs    Medical: Not on file    Non-medical: Not on file  Tobacco Use  . Smoking status: Never Smoker  . Smokeless tobacco: Never Used  Substance and Sexual Activity  . Alcohol use: No    Alcohol/week:  0.0 standard drinks  . Drug use: No  . Sexual activity: Yes    Birth control/protection: None  Lifestyle  . Physical activity    Days per week: 3 days    Minutes per session: 30 min  . Stress: Only a little  Relationships  . Social connections    Talks on phone: More than three times a week    Gets together: More than three times a week    Attends religious service: More than 4 times per year    Active member of club or organization: No    Attends meetings of clubs or organizations: Never    Relationship status: Married  . Intimate partner violence    Fear of current or ex partner: No     Emotionally abused: No    Physically abused: No    Forced sexual activity: No  Other Topics Concern  . Not on file  Social History Narrative   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, she is an Theatre manager   Rare caffeine and never smoker no alcohol no drug or substance use    Allergies:  Allergies  Allergen Reactions  . Amoxicillin Anaphylaxis  . Bee Venom Anaphylaxis  . Sulfa Antibiotics Anaphylaxis  . Sulfonamide Derivatives Anaphylaxis    Lungs close up  . Penicillins Rash    Has patient had a PCN reaction causing immediate rash, facial/tongue/throat swelling, SOB or lightheadedness with hypotension: No Has patient had a PCN reaction causing severe rash involving mucus membranes or skin necrosis: No Has patient had a PCN reaction that required hospitalization: No Has patient had a PCN reaction occurring within the last 10 years: No If all of the above answers are "NO", then may proceed with Cephalosporin use.     Medications: Prior to Admission medications   Medication Sig Start Date End Date Taking? Authorizing Provider  metroNIDAZOLE (FLAGYL) 500 MG tablet Take 1 tablet (500 mg total) by mouth 2 (two) times daily. 11/02/18   Malachy Mood, MD    Physical Exam Vitals:  Vitals:   11/17/18 1457  BP: 122/82  Pulse: 62   Patient's last menstrual period was 01/08/2017.  General: NAD, well nourished, appears stated age 72: normocephalic, anicteric.  The  Pulmonary: No increased work of breathing Neurologic: Grossly intact Psychiatric: mood appropriate, affect full  Female chaperone present for pelvic  portions of the physical exam  Assessment: 48 y.o. [id number] with concern for oral lesion  Plan: Problem List Items Addressed This Visit    None    Visit Diagnoses    Geographic tongue    -  Primary   Relevant Orders   Folate (Completed)   B12 (Completed)   Ferritin (Completed)   CBC (Completed)     1) Oral lesion - palate clear  of lesions, normal papillae, some white discoloration to tongue.  Appearance not classic for thrush.  We discussed possibility of geographic tongue.  Will check folate, iron level, B12, and CBC.  Discussed trial of nystatin swish and swallow vs diflucan. - Rx diflucan  2) A total of 15 minutes were spent in face-to-face contact with the patient during this encounter with over half of that time devoted to counseling and coordination of care.  3) Return if symptoms worsen or fail to improve.    Malachy Mood, MD, [facility], [place]

## 2018-11-18 LAB — CBC
Hematocrit: 46.9 % — ABNORMAL HIGH (ref 34.0–46.6)
Hemoglobin: 16.1 g/dL — ABNORMAL HIGH (ref 11.1–15.9)
MCH: 30.7 pg (ref 26.6–33.0)
MCHC: 34.3 g/dL (ref 31.5–35.7)
MCV: 90 fL (ref 79–97)
Platelets: 281 10*3/uL (ref 150–450)
RBC: 5.24 x10E6/uL (ref 3.77–5.28)
RDW: 11.7 % (ref 11.7–15.4)
WBC: 7.1 10*3/uL (ref 3.4–10.8)

## 2018-11-18 LAB — FOLATE: Folate: 12.2 ng/mL (ref >3.0)

## 2018-11-18 LAB — FERRITIN: Ferritin: 128 ng/mL (ref 15–150)

## 2018-11-18 LAB — VITAMIN B12: Vitamin B-12: 414 pg/mL (ref 232–1245)

## 2018-12-23 ENCOUNTER — Encounter: Payer: Self-pay | Admitting: Family Medicine

## 2018-12-25 MED ORDER — NYSTATIN 100000 UNIT/ML MT SUSP: 5.0000 mL | Freq: Three times a day (TID) | OROMUCOSAL | 0 refills | Status: DC

## 2018-12-26 ENCOUNTER — Other Ambulatory Visit: Payer: Self-pay | Admitting: Obstetrics and Gynecology

## 2018-12-26 MED ORDER — TERCONAZOLE 0.4 % VA CREA: 1.0000 | TOPICAL_CREAM | Freq: Every day | VAGINAL | 1 refills | Status: DC

## 2019-01-05 ENCOUNTER — Ambulatory Visit (INDEPENDENT_AMBULATORY_CARE_PROVIDER_SITE_OTHER): Payer: BC Managed Care – PPO

## 2019-01-05 DIAGNOSIS — Z23 Encounter for immunization: Secondary | ICD-10-CM

## 2019-01-06 ENCOUNTER — Encounter: Payer: Self-pay | Admitting: Family Medicine

## 2019-01-06 DIAGNOSIS — R208 Other disturbances of skin sensation: Secondary | ICD-10-CM

## 2019-01-06 MED ORDER — FLUCONAZOLE 150 MG PO TABS: 150.0000 mg | ORAL_TABLET | Freq: Once | ORAL | 0 refills | Status: AC

## 2019-01-09 NOTE — Addendum Note (Signed)
Addended by: [facility]ra [facility] A on: 01/09/2019 09:36 PM   Modules accepted: Orders

## 2019-01-13 DIAGNOSIS — K121 Other forms of stomatitis: Secondary | ICD-10-CM | POA: Diagnosis not present

## 2019-01-18 ENCOUNTER — Telehealth: Payer: Self-pay | Admitting: *Deleted

## 2019-02-02 DIAGNOSIS — K146 Glossodynia: Secondary | ICD-10-CM | POA: Diagnosis not present

## 2019-02-23 ENCOUNTER — Ambulatory Visit: Payer: BC Managed Care – PPO | Admitting: Student in an Organized Health Care Education/Training Program
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
## 2019-03-07 ENCOUNTER — Encounter: Payer: Self-pay | Admitting: Student in an Organized Health Care Education/Training Program

## 2019-03-07 ENCOUNTER — Other Ambulatory Visit: Payer: Self-pay

## 2019-03-07 ENCOUNTER — Ambulatory Visit
Payer: BC Managed Care – PPO | Attending: Student in an Organized Health Care Education/Training Program | Admitting: Student in an Organized Health Care Education/Training Program

## 2019-03-07 VITALS — BP 131/89 | HR 65 | Temp 98.2°F | Ht 64.0 in | Wt 159.0 lb

## 2019-03-07 DIAGNOSIS — R1011 Right upper quadrant pain: Secondary | ICD-10-CM | POA: Diagnosis not present

## 2019-03-07 NOTE — Progress Notes (Signed)
Safety precautions to be maintained throughout the outpatient stay will include: orient to surroundings, keep bed in low position, maintain call bell within reach at all times, provide assistance with transfer out of bed and ambulation.  

## 2019-03-07 NOTE — Patient Instructions (Signed)
We discussed potentially doing a TAP block

## 2019-03-29 DIAGNOSIS — K146 Glossodynia: Secondary | ICD-10-CM | POA: Diagnosis not present

## 2019-03-29 DIAGNOSIS — B370 Candidal stomatitis: Secondary | ICD-10-CM | POA: Diagnosis not present

## 2019-04-02 ENCOUNTER — Encounter: Payer: Self-pay | Admitting: Family Medicine

## 2019-04-03 ENCOUNTER — Ambulatory Visit (INDEPENDENT_AMBULATORY_CARE_PROVIDER_SITE_OTHER): Payer: BC Managed Care – PPO | Admitting: Family Medicine

## 2019-04-03 ENCOUNTER — Encounter: Payer: Self-pay | Admitting: Family Medicine

## 2019-04-03 VITALS — BP 142/78 | HR 62 | Temp 97.3°F

## 2019-04-03 DIAGNOSIS — R208 Other disturbances of skin sensation: Secondary | ICD-10-CM

## 2019-04-03 DIAGNOSIS — R053 Chronic cough: Secondary | ICD-10-CM

## 2019-04-03 DIAGNOSIS — R05 Cough: Secondary | ICD-10-CM | POA: Diagnosis not present

## 2019-04-03 NOTE — Telephone Encounter (Signed)
Courtney Pineda scheduled appt for today

## 2019-04-03 NOTE — Progress Notes (Signed)
Virtual Visit via Video Note  I connected with Courtney Pineda on 04/03/19 at  2:30 PM EST by a video enabled telemedicine application and verified that I am speaking with the correct person using two identifiers.  Location: Patient: home Provider: office    I discussed the limitations of evaluation and management by telemedicine and the availability of in person appointments. The patient expressed understanding and agreed to proceed.  Parties involved in encounter  Patient: Courtney Pineda  Provider:  Loura Pardon MD    History of Present Illness: Pt presents for sore throat and cough (comes and goes)  Cough and ST are on and off all year  Thought it may be allergies    Was seen recently to oral specialist in [facility] for burning tongue and mouth  Was placed on new medication (Dr Tami Ribas sent her) Sent in clotrimazole lozenges Has not noticed a difference Sent off a fungal culture -still pending   Her burning mouth is worse in the am  She is drinking lots of water  Has almost totally cut out soda   ? If her ST is related to that   Not exercising as much - bp went up a bit as well   ST- "just a sore throat" Able to eat and drink  Frustrating  At times it feels a little swollen on and off  She does have some pnd - mucous is clear  No sinus pain /pressure- just feels dry (plans to get a humidifier   Cough was more persistent in the spring  Less now -more just in am and better during the day  She took  otc allergy med and mucinex  Does have throat clearing  Unsure which generic antihistamine she took   No acid reflux symptoms   Saw ENT in oct  Told to eat a bland diet to avoid bland diet for several weeks and stopping carbonation  Throat "looked fine" and strep test was negative   Patient Active Problem List   Diagnosis Date Noted  . Perimenopause 06/08/2018  . Low HDL (under 40) 06/08/2018  . [place] intestinal bacterial overgrowth 02/09/2018  .  Abdominal bloating   . Cyst of right kidney 06/04/2017  . RUQ abdominal pain 05/31/2017  . Hematuria 05/31/2017  . Elevated glucose level 05/22/2017  . S/P laparoscopic hysterectomy 01/14/2017  . Abdominal pain 07/27/2016  . Daily headache 06/01/2016  . Chronic cough 06/01/2016  . Encounter for routine gynecological examination 05/23/2014  . Burning sensation of mouth 09/12/2013  . Routine general medical examination at a health care facility 01/05/2011  . KIDNEY STONE 03/27/2010  . STRESS REACTION, ACUTE, WITH EMOTIONAL DISTURBANCE 09/13/2007  . HEADACHE 09/13/2007   Past Medical History:  Diagnosis Date  . Anemia   . Atypical moles    abnormal moles removed from neck.  . Chronic right upper quadrant pain   . History of kidney stones   . Hyperemesis gravidarum 1999  . [place] intestinal bacterial overgrowth 02/09/2018   Past Surgical History:  Procedure Laterality Date  . CHOLECYSTECTOMY  06/1994  . COLONOSCOPY WITH PROPOFOL N/A 07/12/2017   Procedure: COLONOSCOPY WITH PROPOFOL;  Surgeon: Lin Landsman, MD;  Location: [facility] ENDOSCOPY;  Service: Gastroenterology;  Laterality: N/A;  . CYSTOSCOPY N/A 01/14/2017   Procedure: CYSTOSCOPY;  Surgeon: Malachy Mood, MD;  Location: [facility] ORS;  Service: Gynecology;  Laterality: N/A;  . ESOPHAGOGASTRODUODENOSCOPY (EGD) WITH PROPOFOL N/A 07/12/2017   Procedure: ESOPHAGOGASTRODUODENOSCOPY (EGD) WITH PROPOFOL;  Surgeon: Sherri Sear  Reece Levy, MD;  Location: [facility];  Service: Gastroenterology;  Laterality: N/A;  . KIDNEY STONE SURGERY    . LITHOTRIPSY  03/2006  . TONSILECTOMY, ADENOIDECTOMY, BILATERAL MYRINGOTOMY AND TUBES     myringotomy tubes  . TOTAL LAPAROSCOPIC HYSTERECTOMY WITH SALPINGECTOMY Bilateral 01/14/2017   Procedure: HYSTERECTOMY TOTAL LAPAROSCOPIC WITH SALPINGECTOMY;  Surgeon: Malachy Mood, MD;  Location: [facility] ORS;  Service: Gynecology;  Laterality: Bilateral;   Social History   Tobacco Use  . Smoking status:  Never Smoker  . Smokeless tobacco: Never Used  Substance Use Topics  . Alcohol use: No    Alcohol/week: 0.0 standard drinks  . Drug use: No   Family History  Problem Relation Age of Onset  . Migraines Mother   . Breast cancer Paternal Aunt        Contact  . Cancer Maternal Grandmother 46       brain tumor/breast cancer  . Breast cancer Maternal Grandmother   . Heart disease Maternal Grandfather        heart problems  . Colon cancer Neg Hx   . Esophageal cancer Neg Hx   . Liver disease Neg Hx   . Pancreatic cancer Neg Hx   . Stomach cancer Neg Hx   . Inflammatory bowel disease Neg Hx    Allergies  Allergen Reactions  . Amoxicillin Anaphylaxis  . Bee Venom Anaphylaxis  . Sulfa Antibiotics Anaphylaxis  . Sulfonamide Derivatives Anaphylaxis    Lungs close up  . Penicillins Rash    Has patient had a PCN reaction causing immediate rash, facial/tongue/throat swelling, SOB or lightheadedness with hypotension: No Has patient had a PCN reaction causing severe rash involving mucus membranes or skin necrosis: No Has patient had a PCN reaction that required hospitalization: No Has patient had a PCN reaction occurring within the last 10 years: No If all of the above answers are "NO", then may proceed with Cephalosporin use.    Current Outpatient Medications on File Prior to Visit  Medication Sig Dispense Refill  . acetaminophen (TYLENOL) 325 MG tablet Take 650 mg by mouth every 6 (six) hours as needed.    . clotrimazole (MYCELEX) 10 MG troche Let one tablet dissolve in mouth 5 times daily    . zinc gluconate 50 MG tablet Take 1 tablet by mouth daily.     No current facility-administered medications on file prior to visit.   Review of Systems  Constitutional: Negative for chills, fever and malaise/fatigue.  HENT: Positive for sore throat. Negative for congestion, ear discharge, ear pain and sinus pain.        Burning sensation of tongue   Eyes: Negative for blurred vision,  discharge and redness.  Respiratory: Positive for cough. Negative for hemoptysis, sputum production, shortness of breath, wheezing and stridor.   Cardiovascular: Negative for chest pain, palpitations and leg swelling.  Gastrointestinal: Negative for abdominal pain, diarrhea, nausea and vomiting.  Musculoskeletal: Negative for myalgias.  Skin: Negative for rash.  Neurological: Negative for dizziness and headaches.  Psychiatric/Behavioral:       Stressed     Observations/Objective: Patient appears well, in no distress Weight is baseline  No facial swelling or asymmetry Normal voice-not hoarse and no slurred speech No obvious tremor or mobility impairment Moving neck and UEs normally Able to hear the call well  No cough or shortness of breath during interview  Talkative and mentally sharp with no cognitive changes No skin changes on face or neck , no rash or pallor Affect is normal (  she does however become tearful when discussing stressors briefly) -discusses stressors candidly    Assessment and Plan: Problem List Items Addressed This Visit      Other   Burning sensation of mouth    Pt has seen ENT and dental specialist  Reviewed most recent note Using clotrimazole lozenges and pending fungal culture  Unsure if this is adding to her chronic cough       Chronic cough - Primary    Suspect multifactorial (also with ST) Has seen ENT (also being tx for burning mouth)  Disc poss of allergy pnd as well as GERD inst to get  Zyrtec 10 mg take once daily  Also either tagamet or pepcid-take as directed (H2 blocker would help either way) Vaporizer for dryness  inst pt to update [place] in 1-2 wk or earlier if symptoms worsen           Follow Up Instructions: et't try treating allergies and also acid reflux so see if this helps cough and sore throat   Get generic zyrtec and take 10 mg daily  Get generic tagamet or pepcid and take as directed Also try a vaporizer in room  Also  continue the clotrimazole lozenges  Please call us in 1-2 weeks with an update    I discussed the assessment and treatment plan with the patient. The patient was provided an opportunity to ask questions and all were answered. The patient agreed with the plan and demonstrated an understanding of the instructions.   The patient was advised to call back or seek an in-person evaluation if the symptoms worsen or if the condition fails to improve as anticipated.   Loura Pardon, MD

## 2019-04-03 NOTE — Assessment & Plan Note (Signed)
Suspect multifactorial (also with ST) Has seen ENT (also being tx for burning mouth)  Disc poss of allergy pnd as well as GERD inst to get  Zyrtec 10 mg take once daily  Also either tagamet or pepcid-take as directed (H2 blocker would help either way) Vaporizer for dryness  inst pt to update [place] in 1-2 wk or earlier if symptoms worsen

## 2019-04-03 NOTE — Patient Instructions (Signed)
let't try treating allergies and also acid reflux so see if this helps cough and sore throat   Get generic zyrtec and take 10 mg daily  Get generic tagamet or pepcid and take as directed Also try a vaporizer in room  Also continue the clotrimazole lozenges  Please call us in 1-2 weeks with an update

## 2019-04-03 NOTE — Assessment & Plan Note (Addendum)
Pt has seen ENT and dental specialist  Reviewed most recent note Using clotrimazole lozenges and pending fungal culture  Unsure if this is adding to her chronic cough

## 2019-05-10 ENCOUNTER — Other Ambulatory Visit: Payer: Self-pay | Admitting: Family Medicine

## 2019-05-10 DIAGNOSIS — K146 Glossodynia: Secondary | ICD-10-CM | POA: Diagnosis not present

## 2019-05-10 DIAGNOSIS — K117 Disturbances of salivary secretion: Secondary | ICD-10-CM | POA: Diagnosis not present

## 2019-05-10 DIAGNOSIS — B370 Candidal stomatitis: Secondary | ICD-10-CM | POA: Diagnosis not present

## 2019-05-10 DIAGNOSIS — Z1231 Encounter for screening mammogram for malignant neoplasm of breast: Secondary | ICD-10-CM

## 2019-05-16 ENCOUNTER — Ambulatory Visit (INDEPENDENT_AMBULATORY_CARE_PROVIDER_SITE_OTHER): Payer: BC Managed Care – PPO | Admitting: Family Medicine

## 2019-05-16 ENCOUNTER — Encounter: Payer: Self-pay | Admitting: Family Medicine

## 2019-05-16 VITALS — BP 124/79 | HR 64

## 2019-05-16 DIAGNOSIS — R05 Cough: Secondary | ICD-10-CM

## 2019-05-16 DIAGNOSIS — R053 Chronic cough: Secondary | ICD-10-CM

## 2019-05-16 DIAGNOSIS — R208 Other disturbances of skin sensation: Secondary | ICD-10-CM

## 2019-05-16 MED ORDER — ALBUTEROL SULFATE HFA 108 (90 BASE) MCG/ACT IN AERS: 2.0000 | INHALATION_SPRAY | RESPIRATORY_TRACT | 3 refills | Status: DC | PRN

## 2019-05-16 NOTE — Assessment & Plan Note (Signed)
She has seen oral specialist- tx with fluconazole and some improvement

## 2019-05-16 NOTE — Assessment & Plan Note (Signed)
Now over a year  Very brief (slight) improvement with zyrtec (oral antihistamine) an no improvement with H2 blocker (acid reducer)  No GI symptoms/ fever  Does not feel sick ? If slt wheezy at times , also sob on exertion which she thinks is from being out of shape  She saw ENT (no cause found)  Is being treated for burning mouth issues (suspect unrelated)  Will ref to pulmonary for further eval/tx of cough  Inst to alert [place] if symptoms suddenly worsen or new ones develop

## 2019-05-16 NOTE — Progress Notes (Signed)
Virtual Visit via Video Note  I connected with Courtney Pineda on 05/16/19 at 11:15 AM EST by a video enabled telemedicine application and verified that I am speaking with the correct person using two identifiers.  Location: Patient: home Provider: office   I discussed the limitations of evaluation and management by telemedicine and the availability of in person appointments. The patient expressed understanding and agreed to proceed.  Parties involved in encounter  Patient: Courtney Pineda  Provider:  Loura Pardon MD     History of Present Illness: Pt presents with cough   This is the same cough she has had chronically   Last visit recommended zyrtec and H2 blocker   Zyrtec - helped very little - used it for 5 days   H2 blocker- she tried off brand of one of those  Did that 5 days  Did not help at all   Does feel like she has drainage (spits out clear)  Antihistamine did not help that at all  Runny nose-is off and on (does not trigger cough)    Cough is worse in the am  Clear- some productive  Perhaps wheezes a little  A little sob with exertion - thinks it is from her deconditioning  Makes her chest sore also  Then off and on through the day  Does not keep her up   Wt is up to 162 lb  Not exercising due to all of these symptoms    Throat- is sore /no improvement at all  When she saw ENT/ Dr Billee Cashing queen- strep was neg - in the fall   Burning mouth is improved (seeing a sub specialist now)  Still some on tongue  Taking anti fungal  Will start a supplement soon -has to order it  F/u in April   Appetite is good No GI symptoms or heartburn  No fever    Saw oral specialist for burning mouth  Used troches   No h/o allergies or asthma in the past   Has outdoor dogs-do not bother her   Cough is going on for over a year       Patient Active Problem List   Diagnosis Date Noted  . Perimenopause 06/08/2018  . Low HDL (under 40) 06/08/2018  . [place]  intestinal bacterial overgrowth 02/09/2018  . Abdominal bloating   . Cyst of right kidney 06/04/2017  . RUQ abdominal pain 05/31/2017  . Hematuria 05/31/2017  . Elevated glucose level 05/22/2017  . S/P laparoscopic hysterectomy 01/14/2017  . Abdominal pain 07/27/2016  . Daily headache 06/01/2016  . Chronic cough 06/01/2016  . Encounter for routine gynecological examination 05/23/2014  . Burning sensation of mouth 09/12/2013  . Routine general medical examination at a health care facility 01/05/2011  . KIDNEY STONE 03/27/2010  . STRESS REACTION, ACUTE, WITH EMOTIONAL DISTURBANCE 09/13/2007  . HEADACHE 09/13/2007   Past Medical History:  Diagnosis Date  . Anemia   . Atypical moles    abnormal moles removed from neck.  . Chronic right upper quadrant pain   . History of kidney stones   . Hyperemesis gravidarum 1999  . [place] intestinal bacterial overgrowth 02/09/2018   Past Surgical History:  Procedure Laterality Date  . CHOLECYSTECTOMY  06/1994  . COLONOSCOPY WITH PROPOFOL N/A 07/12/2017   Procedure: COLONOSCOPY WITH PROPOFOL;  Surgeon: Lin Landsman, MD;  Location: [facility] ENDOSCOPY;  Service: Gastroenterology;  Laterality: N/A;  . CYSTOSCOPY N/A 01/14/2017   Procedure: CYSTOSCOPY;  Surgeon: Malachy Mood, MD;  Location:  [facility] ORS;  Service: Gynecology;  Laterality: N/A;  . ESOPHAGOGASTRODUODENOSCOPY (EGD) WITH PROPOFOL N/A 07/12/2017   Procedure: ESOPHAGOGASTRODUODENOSCOPY (EGD) WITH PROPOFOL;  Surgeon: Lin Landsman, MD;  Location: [facility] ENDOSCOPY;  Service: Gastroenterology;  Laterality: N/A;  . KIDNEY STONE SURGERY    . LITHOTRIPSY  03/2006  . TONSILECTOMY, ADENOIDECTOMY, BILATERAL MYRINGOTOMY AND TUBES     myringotomy tubes  . TOTAL LAPAROSCOPIC HYSTERECTOMY WITH SALPINGECTOMY Bilateral 01/14/2017   Procedure: HYSTERECTOMY TOTAL LAPAROSCOPIC WITH SALPINGECTOMY;  Surgeon: Malachy Mood, MD;  Location: [facility] ORS;  Service: Gynecology;  Laterality: Bilateral;    Social History   Tobacco Use  . Smoking status: Never Smoker  . Smokeless tobacco: Never Used  Substance Use Topics  . Alcohol use: No    Alcohol/week: 0.0 standard drinks  . Drug use: No   Family History  Problem Relation Age of Onset  . Migraines Mother   . Breast cancer Paternal Aunt        Contact  . Cancer Maternal Grandmother 52       brain tumor/breast cancer  . Breast cancer Maternal Grandmother   . Heart disease Maternal Grandfather        heart problems  . Colon cancer Neg Hx   . Esophageal cancer Neg Hx   . Liver disease Neg Hx   . Pancreatic cancer Neg Hx   . Stomach cancer Neg Hx   . Inflammatory bowel disease Neg Hx    Allergies  Allergen Reactions  . Amoxicillin Anaphylaxis  . Bee Venom Anaphylaxis  . Sulfa Antibiotics Anaphylaxis  . Sulfonamide Derivatives Anaphylaxis    Lungs close up  . Penicillins Rash    Has patient had a PCN reaction causing immediate rash, facial/tongue/throat swelling, SOB or lightheadedness with hypotension: No Has patient had a PCN reaction causing severe rash involving mucus membranes or skin necrosis: No Has patient had a PCN reaction that required hospitalization: No Has patient had a PCN reaction occurring within the last 10 years: No If all of the above answers are "NO", then may proceed with Cephalosporin use.    Current Outpatient Medications on File Prior to Visit  Medication Sig Dispense Refill  . acetaminophen (TYLENOL) 325 MG tablet Take 650 mg by mouth every 6 (six) hours as needed.    . fluconazole (DIFLUCAN) 100 MG tablet Take 2 tabs first day, then 1 tab daily for total of 14 days until gone.    . zinc gluconate 50 MG tablet Take 1 tablet by mouth daily.     No current facility-administered medications on file prior to visit.   Review of Systems  Constitutional: Negative for chills, fever and malaise/fatigue.  HENT: Negative for congestion, ear pain, sinus pain and sore throat.        Burning mouth/tongue  (seeing a specialist)  Eyes: Negative for blurred vision, discharge and redness.  Respiratory: Positive for cough, sputum production and wheezing. Negative for shortness of breath and stridor.   Cardiovascular: Negative for chest pain, palpitations, leg swelling and PND.  Gastrointestinal: Negative for abdominal pain, diarrhea, nausea and vomiting.  Musculoskeletal: Negative for myalgias.  Skin: Negative for rash.  Neurological: Negative for dizziness and headaches.    Observations/Objective: Patient appears well, in no distress Weight is baseline  No facial swelling or asymmetry Normal voice-not hoarse and no slurred speech No obvious tremor or mobility impairment Moving neck and UEs normally Able to hear the call well  No cough or shortness of breath during interview (she does clear  throat a few times)  Talkative and mentally sharp with no cognitive changes No skin changes on face or neck , no rash or pallor Affect is normal to mildly anxious    Assessment and Plan: Problem List Items Addressed This Visit      Other   Burning sensation of mouth    She has seen oral specialist- tx with fluconazole and some improvement        Chronic cough - Primary    Now over a year  Very brief (slight) improvement with zyrtec (oral antihistamine) an no improvement with H2 blocker (acid reducer)  No GI symptoms/ fever  Does not feel sick ? If slt wheezy at times , also sob on exertion which she thinks is from being out of shape  She saw ENT (no cause found)  Is being treated for burning mouth issues (suspect unrelated)  Will ref to pulmonary for further eval/tx of cough  Inst to alert [place] if symptoms suddenly worsen or new ones develop       Relevant Orders   Ambulatory referral to Pulmonology       Follow Up Instructions: Try the zyrtec 10 mg daily for at least 2 weeks (store brand is fine)  Also try the albuterol inhaler I sent in to see if it helps cough or wheezing  If  symptoms suddenly worsen or you have a fever or other new problems please call and let us know   I placed a referral for a pulmonary (lung) specialist to see you  The office will call you to set this up     I discussed the assessment and treatment plan with the patient. The patient was provided an opportunity to ask questions and all were answered. The patient agreed with the plan and demonstrated an understanding of the instructions.   The patient was advised to call back or seek an in-person evaluation if the symptoms worsen or if the condition fails to improve as anticipated.   Loura Pardon, MD

## 2019-05-16 NOTE — Patient Instructions (Signed)
Try the zyrtec 10 mg daily for at least 2 weeks (store brand is fine)  Also try the albuterol inhaler I sent in to see if it helps cough or wheezing  If symptoms suddenly worsen or you have a fever or other new problems please call and let us know   I placed a referral for a pulmonary (lung) specialist to see you  The office will call you to set this up

## 2019-05-19 ENCOUNTER — Ambulatory Visit: Payer: BC Managed Care – PPO | Admitting: Internal Medicine

## 2019-05-19 ENCOUNTER — Other Ambulatory Visit: Payer: Self-pay

## 2019-05-19 ENCOUNTER — Ambulatory Visit (INDEPENDENT_AMBULATORY_CARE_PROVIDER_SITE_OTHER): Payer: BC Managed Care – PPO

## 2019-05-19 ENCOUNTER — Encounter: Payer: Self-pay | Admitting: Internal Medicine

## 2019-05-19 DIAGNOSIS — R0789 Other chest pain: Secondary | ICD-10-CM

## 2019-05-19 DIAGNOSIS — R053 Chronic cough: Secondary | ICD-10-CM

## 2019-05-19 DIAGNOSIS — R05 Cough: Secondary | ICD-10-CM

## 2019-05-19 LAB — CBC WITH DIFFERENTIAL/PLATELET
Basophils Absolute: 0.1 10*3/uL (ref 0.0–0.1)
Basophils Relative: 0.7 % (ref 0.0–3.0)
Eosinophils Absolute: 0.1 10*3/uL (ref 0.0–0.7)
Eosinophils Relative: 1.6 % (ref 0.0–5.0)
HCT: 42 % (ref 36.0–46.0)
Hemoglobin: 14.3 g/dL (ref 12.0–15.0)
Lymphocytes Relative: 35.9 % (ref 12.0–46.0)
Lymphs Abs: 3 10*3/uL (ref 0.7–4.0)
MCHC: 34 g/dL (ref 30.0–36.0)
MCV: 91 fl (ref 78.0–100.0)
Monocytes Absolute: 0.6 10*3/uL (ref 0.1–1.0)
Monocytes Relative: 6.9 % (ref 3.0–12.0)
Neutro Abs: 4.6 10*3/uL (ref 1.4–7.7)
Neutrophils Relative %: 54.9 % (ref 43.0–77.0)
Platelets: 292 10*3/uL (ref 150.0–400.0)
RBC: 4.62 Mil/uL (ref 3.87–5.11)
RDW: 13.4 % (ref 11.5–15.5)
WBC: 8.4 10*3/uL (ref 4.0–10.5)

## 2019-05-19 MED ORDER — BENZONATATE 200 MG PO CAPS: 200.0000 mg | ORAL_CAPSULE | Freq: Three times a day (TID) | ORAL | 1 refills | Status: DC | PRN

## 2019-05-19 MED ORDER — FAMOTIDINE 20 MG PO TABS: ORAL_TABLET | ORAL | 11 refills | Status: DC

## 2019-05-19 MED ORDER — GABAPENTIN 100 MG PO CAPS: 100.0000 mg | ORAL_CAPSULE | Freq: Three times a day (TID) | ORAL | 2 refills | Status: DC

## 2019-05-19 NOTE — Progress Notes (Signed)
Courtney Pineda, female    DOB: 11-18-1970  MRN: [id number]   Brief patient profile:  32 yowf never smoker onset of new cough Dec 2019 waxed and waned since assoc doe   so referred to pulmonary clinic 05/19/2019 by Dr   Glori Bickers.    Pt underwent abdominal hysterectomy Oct 2018 for dysmenorrhea, then Feb 2019 started having RUQ/lateral severe crampy pain =  comes and goes always better lying down no assoc nausea vomiting wt loss/  not pleuritic.      History of Present Illness  05/19/2019  Pulmonary/ 1st office eval/Amar Sippel  Chief Complaint  Patient presents with  . Consult    Pt being referred to office due to chronic cough x1 year. Pt states she will become SOB at times as well.  Pt states her cough is worse in the morning.  Dyspnea:  Steps are a problem o/w good activity activity /could  do over an hour treadmill has not done so  since Jan 2 21  Cough: sensation of throat drainage / ent eval [facility] rx diflucan no better / took ppi x one week no better/ no better with zyrtec with urge to clear the throat during the day /no excess mucus Sleep: R chest pain resolves supine as does the cough and sleeps fine once asleep SABA use: not picked up yet   No obvious day to day or daytime variability or assoc excess/ purulent sputum or mucus plugs or hemoptysis or chest tightness, subjective wheeze or overt sinus or hb symptoms.   Sleeping  without nocturnal  or early am exacerbation  of respiratory  c/o's or need for noct saba. Also denies any obvious fluctuation of symptoms with weather or environmental changes or other aggravating or alleviating factors except as outlined above   No unusual exposure hx or h/o childhood pna/ asthma or knowledge of premature birth.  Current Allergies, Complete Past Medical History, Past Surgical History, Family History, and Social History were reviewed in [employer] record.  ROS  The following are not active complaints unless  bolded Hoarseness, sore throat, dysphagia, dental problems, itching, sneezing,  nasal congestion or discharge of excess mucus or purulent secretions, ear ache,   fever, chills, sweats, unintended wt loss or wt gain, classically pleuritic or exertional cp,  orthopnea pnd or arm/hand swelling  or leg swelling, presyncope, palpitations, abdominal pain, anorexia, nausea, vomiting, diarrhea  or change in bowel habits or change in bladder habits, change in stools or change in urine, dysuria, hematuria,  rash, arthralgias, visual complaints, headache, numbness, weakness or ataxia or problems with walking or coordination,  change in mood or  memory.               Past Medical History:  Diagnosis Date  . Anemia   . Atypical moles    abnormal moles removed from neck.  . Chronic right upper quadrant pain   . History of kidney stones   . Hyperemesis gravidarum 1999  . 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 acetaminophen (TYLENOL) 325 MG tablet Take 650 mg by mouth every 6 (six) hours as needed.    Marland Kitchen albuterol (VENTOLIN HFA) 108 (90 Base) MCG/ACT inhaler Inhale 2 puffs into the lungs every 4 (four) hours as needed for wheezing or shortness of breath (cough). 18 g 3  . fluconazole (DIFLUCAN) 100 MG tablet Take 2 tabs first day, then 1 tab daily for  total of 14 days until gone.    . zinc gluconate 50 MG tablet Take 1 tablet by mouth daily.     No facility-administered medications prior to visit.     Objective:     BP 114/70 (BP Location: Left Arm, Patient Position: Sitting, Cuff Size: Normal)   Pulse 66   Ht 5\' 4"  (1.626 m)   Wt 165 lb 6.4 oz (75 kg)   LMP 01/08/2017   SpO2 97% Comment: room air  BMI 28.39 kg/m   SpO2: 97 %(room air)   Pleasant amb wf nad but freq throat grinding cough noted    HEENT : oropharynx  prisitine, no thrush, no pnd   NECK :  without JVD/Nodes/TM/ nl carotid upstrokes  bilaterally   LUNGS: no acc muscle use,  Nl contour chest which is clear to A and P bilaterally without cough on insp or exp maneuvers   CV:  RRR  no s3 or murmur or increase in P2, and no edema   ABD:  soft and nontender with nl inspiratory excursion in the supine position. No bruits or organomegaly appreciated, bowel sounds nl  MS:  Nl gait/ ext warm without deformities, calf tenderness, cyanosis or clubbing No obvious joint restrictions   SKIN: warm and dry without lesions    NEURO:  alert, approp, nl sensorium with  no motor or cerebellar deficits apparent.    CXR PA and Lateral:   05/19/2019 :    I personally reviewed images and agree with radiology impression as follows:    mild thoracic scoliosis with lots of intestinal gas   Lab Results  Component Value Date   WBC 8.4 05/19/2019   HGB 14.3 05/19/2019   HCT 42.0 05/19/2019   MCV 91.0 05/19/2019   PLT 292.0 05/19/2019       EOS                                                               0.1                                     05/19/2019      Assessment   Chronic cough Onset Dec 2019  - gabapentin 100 tid plus h2 bid  rec 05/20/2019 >>>   The most common causes of chronic cough in immunocompetent adults include the following: upper airway cough syndrome (UACS), previously referred to as postnasal drip syndrome (PNDS), which is caused by variety of rhinosinus conditions; (2) asthma; (3) GERD; (4) chronic bronchitis from cigarette smoking or other inhaled environmental irritants; (5) nonasthmatic eosinophilic bronchitis; and (6) bronchiectasis.   These conditions, singly or in combination, have accounted for up to 94% of the causes of chronic cough in prospective studies.   Other conditions have constituted no >6% of the causes in prospective studies These have included bronchogenic carcinoma, chronic interstitial pneumonia, sarcoidosis, left ventricular failure, ACEI-induced cough, and aspiration from a condition associated  with pharyngeal dysfunction.    Chronic cough is often simultaneously caused by more than one condition. A single cause has been found from 38 to 82% of the time, multiple causes from 18 to 62%. Multiply caused cough has been the result of three  diseases up to 42% of the time.   Of the three most common causes of  Sub-acute / recurrent or chronic cough, only one (GERD)  can actually contribute to/ trigger  the other two (asthma and post nasal drip syndrome)  and perpetuate the cylce of cough.  While not intuitively obvious, many patients with chronic low grade reflux do not cough until there is a primary insult that disturbs the protective epithelial barrier and exposes sensitive nerve endings.   This is typically viral but can due to PNDS and  either may apply here.     >>>  The point is that once this occurs, it is difficult to eliminate the cycle  using anything but a maximally effective acid suppression regimen at least in the short run, accompanied by an appropriate diet to address non acid GERD and control / eliminate the cough itself with gabapentin 100 tid  and tessalon 200 prn  > this in turn should also resolve her sensation of persistent "throat pain" from grinding her airway as this trauma disrupts the nl epithelial barrier function of the upper airway making her much more sensitive to non-specific irritants, which is the case here.  >>>> If not better to her satisfaction:  F/u in 4 weeks with all meds in hand using a trust but verify approach to confirm accurate Medication  Reconciliation The principal here is that until we are certain that the  patients are doing what we've asked, it makes no sense to ask them to do more.    >>>> If better ok to let Dr Glori Bickers wean off gabapentin        Atypical chest pain Onset F3b 2019 = 4 months p abd hysterectomy for dysmenorrhea  - trial of ibs diet/ citrucel 05/19/2019 >>>  Classic subdiaphragmatic pain pattern suggests ibs:  Stereotypical  with  a very limited distribution of pain locations, daytime, not usually exacerbated by exercise  or coughing, worse in sitting position, frequently associated with generalized abd bloating, not  present supine (which is almost pathognomonic and is the case here)  due to the dome effect of the diaphragm which  is  canceled in that position. Frequently these patients have had multiple negative GI workups and CT scans.  Treatment consists of avoiding foods that cause gas (especially boiled eggs, mexcican food but especially  beans and undercooked vegetables like  spinach and some salads)  and citrucel 1 heaping tsp twice daily with a large glass of water.  Pain should improve w/in 2 weeks.    Add:  Note this pain started p abd surgery with adhesions that likely contributed to lest robust passage of gas thru the intestines which may be reflected on present cxr.    Total time devoted to counseling  > 50 % of initial 60 min office visit:  review case with pt/ discussion of options/alternatives/ personally creating written customized instructions  in presence of pt  then going over those specific  Instructions directly with the pt including how to use all of the meds but in particular covering each new medication in detail and the difference between the maintenance= "automatic" meds and the prns using an action plan format for the latter (If this problem/symptom => do that organization reading Left to right).  Please see AVS from this visit for a full list of these instructions which I personally wrote for this pt and  are unique to this visit.      Christinia Gully, MD 05/19/2019

## 2019-05-19 NOTE — Patient Instructions (Signed)
Pepcid 20 mg in am and after supper   Gabapentin 100 mg three times daily  - if makes head feel start with just one a day for a week then add one per week.  Citrucel 1sp twice daily with large glass of water.   For cough, throat tickle or pain > tessalon 200mg  up to three times daily   GERD (REFLUX)  is an extremely common cause of respiratory symptoms just like yours , many times with no obvious heartburn at all.    It can be treated with medication, but also with lifestyle changes including elevation of the head of your bed (ideally with 6 -8inch blocks under the headboard of your bed),  Smoking cessation, avoidance of late meals, excessive alcohol, and avoid fatty foods, chocolate, peppermint, colas, red wine, and acidic juices such as orange juice.  NO MINT OR MENTHOL PRODUCTS SO NO COUGH DROPS  USE SUGARLESS CANDY INSTEAD (Jolley ranchers or Stover's or Life Savers) or even ice chips will also do - the key is to swallow to prevent all throat clearing. NO OIL BASED VITAMINS - use powdered substitutes.  Avoid fish oil when coughing.    Classic subdiaphragmatic pain pattern suggests ibs:  Stereotypical, migratory with a very limited distribution of pain locations, daytime, not usually exacerbated by exercise  or coughing, worse in sitting position, frequently associated with generalized abd bloating, not as likely to be present supine due to the dome effect of the diaphragm which  is  canceled in that position. Frequently these patients have had multiple negative GI workups and CT scans.  Treatment consists of avoiding foods that cause gas (especially boiled eggs, mexcican food but especially  beans and undercooked vegetables like  spinach and some salads)   And pain should resolve over 2 weeks.

## 2019-05-20 ENCOUNTER — Encounter: Payer: Self-pay | Admitting: Internal Medicine

## 2019-05-20 NOTE — Assessment & Plan Note (Signed)
Onset F3b 2019 = 4 months p abd hysterectomy for dysmenorrhea  - trial of ibs diet/ citrucel 05/19/2019 >>>  Classic subdiaphragmatic pain pattern suggests ibs:  Stereotypical  with a very limited distribution of pain locations, daytime, not usually exacerbated by exercise  or coughing, worse in sitting position, frequently associated with generalized abd bloating, not  present supine (which is almost pathognomonic and is the case here)  due to the dome effect of the diaphragm which  is  canceled in that position. Frequently these patients have had multiple negative GI workups and CT scans.  Treatment consists of avoiding foods that cause gas (especially boiled eggs, mexcican food but especially  beans and undercooked vegetables like  spinach and some salads)  and citrucel 1 heaping tsp twice daily with a large glass of water.  Pain should improve w/in 2 weeks.    Add:  Note this pain started p abd surgery with adhesions that likely contributed to lest robust passage of gas thru the intestines which may be reflected on present cxr.    Total time devoted to counseling  > 50 % of initial 60 min office visit:  review case with pt/ discussion of options/alternatives/ personally creating written customized instructions  in presence of pt  then going over those specific  Instructions directly with the pt including how to use all of the meds but in particular covering each new medication in detail and the difference between the maintenance= "automatic" meds and the prns using an action plan format for the latter (If this problem/symptom => do that organization reading Left to right).  Please see AVS from this visit for a full list of these instructions which I personally wrote for this pt and  are unique to this visit.

## 2019-05-20 NOTE — Assessment & Plan Note (Addendum)
Onset Dec 2019  - gabapentin 100 tid plus h2 bid  rec 05/20/2019 >>>   The most common causes of chronic cough in immunocompetent adults include the following: upper airway cough syndrome (UACS), previously referred to as postnasal drip syndrome (PNDS), which is caused by variety of rhinosinus conditions; (2) asthma; (3) GERD; (4) chronic bronchitis from cigarette smoking or other inhaled environmental irritants; (5) nonasthmatic eosinophilic bronchitis; and (6) bronchiectasis.   These conditions, singly or in combination, have accounted for up to 94% of the causes of chronic cough in prospective studies.   Other conditions have constituted no >6% of the causes in prospective studies These have included bronchogenic carcinoma, chronic interstitial pneumonia, sarcoidosis, left ventricular failure, ACEI-induced cough, and aspiration from a condition associated with pharyngeal dysfunction.    Chronic cough is often simultaneously caused by more than one condition. A single cause has been found from 38 to 82% of the time, multiple causes from 18 to 62%. Multiply caused cough has been the result of three diseases up to 42% of the time.   Of the three most common causes of  Sub-acute / recurrent or chronic cough, only one (GERD)  can actually contribute to/ trigger  the other two (asthma and post nasal drip syndrome)  and perpetuate the cylce of cough.  While not intuitively obvious, many patients with chronic low grade reflux do not cough until there is a primary insult that disturbs the protective epithelial barrier and exposes sensitive nerve endings.   This is typically viral but can due to PNDS and  either may apply here.     >>>  The point is that once this occurs, it is difficult to eliminate the cycle  using anything but a maximally effective acid suppression regimen at least in the short run, accompanied by an appropriate diet to address non acid GERD and control / eliminate the cough itself with  gabapentin 100 tid and tessalon 200 prn > this in turn should also resolve her sensation of persistent "throat pain" from grinding her airway as this trauma disrupts the nl epithelial barrier function of the upper airway making her much more sensitive to non-specific irritants, which is the case here.  If not better to her satisfaction:  F/u in 4 weeks with all meds in hand using a trust but verify approach to confirm accurate Medication  Reconciliation The principal here is that until we are certain that the  patients are doing what we've asked, it makes no sense to ask them to do more.    If better ok to let Dr Glori Bickers wean off gabapentin

## 2019-05-22 LAB — IGE: IgE (Immunoglobulin E), Serum: 8 kU/L (ref <=114)

## 2019-05-22 NOTE — Progress Notes (Signed)
Spoke with pt and notified of results per Dr. Wert. Pt verbalized understanding and denied any questions. 

## 2019-06-04 ENCOUNTER — Telehealth: Payer: Self-pay | Admitting: Family Medicine

## 2019-06-04 DIAGNOSIS — R7309 Other abnormal glucose: Secondary | ICD-10-CM

## 2019-06-04 DIAGNOSIS — Z0000 Encounter for general adult medical examination without abnormal findings: Secondary | ICD-10-CM

## 2019-06-04 DIAGNOSIS — E786 Lipoprotein deficiency: Secondary | ICD-10-CM

## 2019-06-04 NOTE — Telephone Encounter (Signed)
-----   Message from Ellamae Sia sent at 05/24/2019 10:44 AM EST ----- Regarding: Lab orders for Monday, 2.22.21 Patient is scheduled for CPX labs, please order future labs, Thanks , Karna Christmas

## 2019-06-05 ENCOUNTER — Other Ambulatory Visit (INDEPENDENT_AMBULATORY_CARE_PROVIDER_SITE_OTHER): Payer: BC Managed Care – PPO

## 2019-06-05 ENCOUNTER — Other Ambulatory Visit: Payer: Self-pay

## 2019-06-05 DIAGNOSIS — E786 Lipoprotein deficiency: Secondary | ICD-10-CM | POA: Diagnosis not present

## 2019-06-05 DIAGNOSIS — R7309 Other abnormal glucose: Secondary | ICD-10-CM | POA: Diagnosis not present

## 2019-06-05 DIAGNOSIS — Z0000 Encounter for general adult medical examination without abnormal findings: Secondary | ICD-10-CM | POA: Diagnosis not present

## 2019-06-05 LAB — CBC WITH DIFFERENTIAL/PLATELET
Basophils Absolute: 0 10*3/uL (ref 0.0–0.1)
Basophils Relative: 0.6 % (ref 0.0–3.0)
Eosinophils Absolute: 0.1 10*3/uL (ref 0.0–0.7)
Eosinophils Relative: 1.3 % (ref 0.0–5.0)
HCT: 43.8 % (ref 36.0–46.0)
Hemoglobin: 14.6 g/dL (ref 12.0–15.0)
Lymphocytes Relative: 21.8 % (ref 12.0–46.0)
Lymphs Abs: 1.8 10*3/uL (ref 0.7–4.0)
MCHC: 33.3 g/dL (ref 30.0–36.0)
MCV: 92.2 fl (ref 78.0–100.0)
Monocytes Absolute: 0.5 10*3/uL (ref 0.1–1.0)
Monocytes Relative: 6.1 % (ref 3.0–12.0)
Neutro Abs: 5.6 10*3/uL (ref 1.4–7.7)
Neutrophils Relative %: 70.2 % (ref 43.0–77.0)
Platelets: 264 10*3/uL (ref 150.0–400.0)
RBC: 4.75 Mil/uL (ref 3.87–5.11)
RDW: 13.2 % (ref 11.5–15.5)
WBC: 8 10*3/uL (ref 4.0–10.5)

## 2019-06-05 LAB — TSH: TSH: 1.6 u[IU]/mL (ref 0.35–4.50)

## 2019-06-05 LAB — HEMOGLOBIN A1C: Hgb A1c MFr Bld: 5.1 % (ref 4.6–6.5)

## 2019-06-06 LAB — COMPREHENSIVE METABOLIC PANEL
ALT: 17 U/L (ref 0–35)
AST: 22 U/L (ref 0–37)
Albumin: 4.3 g/dL (ref 3.5–5.2)
Alkaline Phosphatase: 92 U/L (ref 39–117)
BUN: 9 mg/dL (ref 6–23)
CO2: 25 mEq/L (ref 19–32)
Calcium: 9.6 mg/dL (ref 8.4–10.5)
Chloride: 105 mEq/L (ref 96–112)
Creatinine, Ser: 0.83 mg/dL (ref 0.40–1.20)
GFR: 73.95 mL/min (ref >60.00)
Glucose, Bld: 94 mg/dL (ref 70–99)
Potassium: 3.9 mEq/L (ref 3.5–5.1)
Sodium: 139 mEq/L (ref 135–145)
Total Bilirubin: 0.7 mg/dL (ref 0.2–1.2)
Total Protein: 7.1 g/dL (ref 6.0–8.3)

## 2019-06-06 LAB — LIPID PANEL
Cholesterol: 172 mg/dL (ref 0–200)
HDL: 41 mg/dL (ref >39.00)
LDL Cholesterol: 107 mg/dL — ABNORMAL HIGH (ref 0–99)
NonHDL: 130.56
Total CHOL/HDL Ratio: 4
Triglycerides: 120 mg/dL (ref 0.0–149.0)
VLDL: 24 mg/dL (ref 0.0–40.0)

## 2019-06-12 ENCOUNTER — Other Ambulatory Visit: Payer: Self-pay

## 2019-06-12 ENCOUNTER — Ambulatory Visit (INDEPENDENT_AMBULATORY_CARE_PROVIDER_SITE_OTHER): Payer: BC Managed Care – PPO | Admitting: Family Medicine

## 2019-06-12 ENCOUNTER — Encounter: Payer: Self-pay | Admitting: Family Medicine

## 2019-06-12 VITALS — BP 118/62 | HR 71 | Temp 97.8°F | Ht 64.25 in | Wt 164.4 lb

## 2019-06-12 DIAGNOSIS — R208 Other disturbances of skin sensation: Secondary | ICD-10-CM | POA: Diagnosis not present

## 2019-06-12 DIAGNOSIS — E786 Lipoprotein deficiency: Secondary | ICD-10-CM | POA: Diagnosis not present

## 2019-06-12 DIAGNOSIS — R053 Chronic cough: Secondary | ICD-10-CM

## 2019-06-12 DIAGNOSIS — R7309 Other abnormal glucose: Secondary | ICD-10-CM

## 2019-06-12 DIAGNOSIS — R05 Cough: Secondary | ICD-10-CM

## 2019-06-12 DIAGNOSIS — Z0000 Encounter for general adult medical examination without abnormal findings: Secondary | ICD-10-CM

## 2019-06-12 NOTE — Assessment & Plan Note (Signed)
Improved this check Disc goals for lipids and reasons to control them Rev last labs with pt Rev low sat fat diet in detail Commended  Enc to continue exercise

## 2019-06-12 NOTE — Assessment & Plan Note (Signed)
Reviewed health habits including diet and exercise and skin cancer prevention Reviewed appropriate screening tests for age  Also reviewed health mt list, fam hx and immunization status , as well as social and family history   See HPI Labs reviewed  Eating better and plans to use treadmill more Mammogram is scheduled for 06/27/19 Seeing gyn as well  Plans to get a covid vaccines when available

## 2019-06-12 NOTE — Assessment & Plan Note (Signed)
Seeing oral specialist  Still has symptoms

## 2019-06-12 NOTE — Patient Instructions (Signed)
Take care of yourself ! Keep working on an exercise plan  Eat healthy- and avoid foods that worsen your reflux issues   Labs look good   For cholesterol  Avoid red meat/ fried foods/ egg yolks/ fatty breakfast meats/ butter, cheese and high fat dairy/ and shellfish

## 2019-06-12 NOTE — Assessment & Plan Note (Signed)
Has seen Dr Melvyn Novas Now taking H2 blocker and gabapentin Some improvement  Figuring out which foods worsen this

## 2019-06-12 NOTE — Progress Notes (Signed)
Subjective:    Patient ID: Courtney Pineda, female    DOB: 1970/07/13, 49 y.o.   MRN: [id number]  This visit occurred during the SARS-CoV-2 public health emergency.  Safety protocols were in place, including screening questions prior to the visit, additional usage of staff PPE, and extensive cleaning of exam room while observing appropriate contact time as indicated for disinfecting solutions.    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-has f/u at end of April   Also chronic RUQ abd pain   Wt Readings from Last 3 Encounters:  06/12/19 164 lb 7 oz (74.6 kg)  05/19/19 165 lb 6.4 oz (75 kg)  03/07/19 159 lb (72.1 kg)  trying to eat better  Also drinking more water  Exercise - has a treadmill and starting to use it more (very hard to fit in) -working at home and hours are long  Looking forward to getting outside after the time change  28.01 kg/m   Mammogram 3/20  Has it scheduled on 06/27/19 Self breast exam- no lumps  MGM had breast cancer   Some vaginal issues- sees gyn next month   Tdap 12/14  Flu shot 9/20    BP Readings from Last 3 Encounters:  06/12/19 118/62  05/19/19 114/70  05/16/19 124/79   Pulse Readings from Last 3 Encounters:  06/12/19 71  05/19/19 66  05/16/19 64    Had elevated glucose reading in the past Lab Results  Component Value Date   HGBA1C 5.1 06/05/2019   trying not to eat girl scout cookies  Stable/excellent    H/o low HDL Lab Results  Component Value Date   CHOL 172 06/05/2019   CHOL 150  05/30/2018   CHOL 161 05/28/2017   Lab Results  Component Value Date   HDL 41.00 06/05/2019   HDL 32.60 (L) 05/30/2018   HDL 34.60 (L) 05/28/2017   Lab Results  Component Value Date   LDLCALC 107 (H) 06/05/2019   LDLCALC 99 05/30/2018   LDLCALC 109 (H) 05/28/2017   Lab Results  Component Value Date   TRIG 120.0 06/05/2019   TRIG 94.0 05/30/2018   TRIG 88.0 05/28/2017   Lab Results  Component Value Date   CHOLHDL 4 06/05/2019   CHOLHDL 5 05/30/2018   CHOLHDL 5 05/28/2017   No results found for: LDLDIRECT HDL is improved this draw   Lab Results  Component Value Date   CREATININE 0.83 06/05/2019   BUN 9 06/05/2019   NA 139 06/05/2019   K 3.9 06/05/2019   CL 105 06/05/2019   CO2 25 06/05/2019   Lab Results  Component Value Date   WBC 8.0 06/05/2019   HGB 14.6 06/05/2019   HCT 43.8 06/05/2019   MCV 92.2 06/05/2019   PLT 264.0 06/05/2019   Lab Results  Component Value Date   TSH 1.60 06/05/2019    Lab Results  Component Value Date   ALT 17 06/05/2019   AST 22 06/05/2019   ALKPHOS  92 06/05/2019   BILITOT 0.7 06/05/2019    Patient Active Problem List   Diagnosis Date Noted  . Perimenopause 06/08/2018  . Low HDL (under 40) 06/08/2018  . Baer intestinal bacterial overgrowth 02/09/2018  . Abdominal bloating   . Cyst of right kidney 06/04/2017  . RUQ abdominal pain 05/31/2017  . Elevated glucose level 05/22/2017  . S/P laparoscopic hysterectomy 01/14/2017  . Abdominal pain 07/27/2016  . Daily headache 06/01/2016  . Chronic cough 06/01/2016  . Encounter for routine gynecological examination 05/23/2014  . Burning sensation of mouth 09/12/2013  . Routine general medical examination at a health care facility 01/05/2011  . KIDNEY STONE 03/27/2010  . Atypical chest pain 07/18/2008  . STRESS REACTION, ACUTE, WITH EMOTIONAL DISTURBANCE 09/13/2007  . HEADACHE 09/13/2007   Past Medical History:  Diagnosis Date  . Anemia   . Atypical moles    abnormal  moles removed from neck.  . Chronic right upper quadrant pain   . History of kidney stones   . Hyperemesis gravidarum 1999  . Nesser intestinal bacterial overgrowth 02/09/2018   Past Surgical History:  Procedure Laterality Date  . CHOLECYSTECTOMY  06/1994  . COLONOSCOPY WITH PROPOFOL N/A 07/12/2017   Procedure: COLONOSCOPY WITH PROPOFOL;  Surgeon: Lin Landsman, MD;  Location: [facility] ENDOSCOPY;  Service: Gastroenterology;  Laterality: N/A;  . CYSTOSCOPY N/A 01/14/2017   Procedure: CYSTOSCOPY;  Surgeon: Malachy Mood, MD;  Location: [facility] ORS;  Service: Gynecology;  Laterality: N/A;  . ESOPHAGOGASTRODUODENOSCOPY (EGD) WITH PROPOFOL N/A 07/12/2017   Procedure: ESOPHAGOGASTRODUODENOSCOPY (EGD) WITH PROPOFOL;  Surgeon: Lin Landsman, MD;  Location: [facility] ENDOSCOPY;  Service: Gastroenterology;  Laterality: N/A;  . KIDNEY STONE SURGERY    . LITHOTRIPSY  03/2006  . TONSILECTOMY, ADENOIDECTOMY, BILATERAL MYRINGOTOMY AND TUBES     myringotomy tubes  . TOTAL LAPAROSCOPIC HYSTERECTOMY WITH SALPINGECTOMY Bilateral 01/14/2017   Procedure: HYSTERECTOMY TOTAL LAPAROSCOPIC WITH SALPINGECTOMY;  Surgeon: Malachy Mood, MD;  Location: [facility] ORS;  Service: Gynecology;  Laterality: Bilateral;   Social History   Tobacco Use  . Smoking status: Never Smoker  . Smokeless tobacco: Never Used  Substance Use Topics  . Alcohol use: No    Alcohol/week: 0.0 standard drinks  . Drug use: No   Family History  Problem Relation Age of Onset  . Migraines Mother   . Breast cancer Paternal Aunt        Contact  . Cancer Maternal Grandmother 46       brain tumor/breast cancer  . Breast cancer Maternal Grandmother   . Heart disease Maternal Grandfather        heart problems  . Colon cancer Neg Hx   . Esophageal cancer Neg Hx   . Liver disease Neg Hx   . Pancreatic cancer Neg Hx   . Stomach cancer Neg Hx   . Inflammatory bowel disease Neg Hx    Allergies  Allergen Reactions  . Amoxicillin Anaphylaxis   . Bee Venom Anaphylaxis  . Sulfa Antibiotics Anaphylaxis  . Sulfonamide Derivatives Anaphylaxis    Lungs close up  . Penicillins Rash    Has patient had a PCN reaction causing immediate rash, facial/tongue/throat swelling, SOB or lightheadedness with hypotension: No Has patient had a PCN reaction causing severe rash involving mucus membranes or skin necrosis: No Has patient had a PCN reaction that required hospitalization: No Has patient had a PCN reaction occurring within the last 10 years: No If all of the above answers are "NO", then may proceed with Cephalosporin use.  Current Outpatient Medications on File Prior to Visit  Medication Sig Dispense Refill  . acetaminophen (TYLENOL) 325 MG tablet Take 650 mg by mouth every 6 (six) hours as needed.    . benzonatate (TESSALON) 200 MG capsule Take 1 capsule (200 mg total) by mouth 3 (three) times daily as needed for cough. 30 capsule 1  . famotidine (PEPCID) 20 MG tablet One in am and after supper 60 tablet 11  . gabapentin (NEURONTIN) 100 MG capsule Take 1 capsule (100 mg total) by mouth 3 (three) times daily. One three times daily 90 capsule 2  . zinc gluconate 50 MG tablet Take 1 tablet by mouth daily.     No current facility-administered medications on file prior to visit.    Review of Systems  Constitutional: Negative for activity change, appetite change, fatigue, fever and unexpected weight change.  HENT: Negative for congestion, ear pain, rhinorrhea, sinus pressure and sore throat.        Chronic burning mouth syndrome   Eyes: Negative for pain, redness and visual disturbance.  Respiratory: Positive for cough. Negative for shortness of breath and wheezing.   Cardiovascular: Negative for chest pain and palpitations.  Gastrointestinal: Negative for abdominal pain, blood in stool, constipation and diarrhea.       Chronic ruq abd pain   Endocrine: Negative for polydipsia and polyuria.  Genitourinary: Negative for dysuria,  frequency and urgency.  Musculoskeletal: Negative for arthralgias, back pain and myalgias.  Skin: Negative for pallor and rash.  Allergic/Immunologic: Negative for environmental allergies.  Neurological: Negative for dizziness, syncope and headaches.  Hematological: Negative for adenopathy. Does not bruise/bleed easily.  Psychiatric/Behavioral: Negative for decreased concentration and dysphoric mood. The patient is not nervous/anxious.        Objective:   Physical Exam Constitutional:      General: She is not in acute distress.    Appearance: Normal appearance. She is well-developed and normal weight. She is not ill-appearing or diaphoretic.  HENT:     Head: Normocephalic and atraumatic.     Right Ear: Tympanic membrane, ear canal and external ear normal.     Left Ear: Tympanic membrane, ear canal and external ear normal.     Nose: Nose normal. No congestion.     Mouth/Throat:     Mouth: Mucous membranes are moist.     Pharynx: Oropharynx is clear. No oropharyngeal exudate or posterior oropharyngeal erythema.  Eyes:     General: No scleral icterus.    Extraocular Movements: Extraocular movements intact.     Conjunctiva/sclera: Conjunctivae normal.     Pupils: Pupils are equal, round, and reactive to light.  Neck:     Thyroid: No thyromegaly.     Vascular: No carotid bruit or JVD.  Cardiovascular:     Rate and Rhythm: Normal rate and regular rhythm.     Pulses: Normal pulses.     Heart sounds: Normal heart sounds. No gallop.   Pulmonary:     Effort: Pulmonary effort is normal. No respiratory distress.     Breath sounds: Normal breath sounds. No wheezing.     Comments: Good air exch Chest:     Chest wall: No tenderness.  Abdominal:     General: Bowel sounds are normal. There is no distension or abdominal bruit.     Palpations: Abdomen is soft. There is no mass.     Tenderness: There is no abdominal tenderness. There is no guarding or rebound.     Hernia: No hernia is  present.  Genitourinary:    Comments: Breast exam: No mass, nodules, thickening, tenderness, bulging, retraction, inflamation, nipple discharge or skin changes noted.  No axillary or clavicular LA.     Musculoskeletal:        General: No tenderness. Normal range of motion.     Cervical back: Normal range of motion and neck supple. No rigidity. No muscular tenderness.     Right lower leg: No edema.     Left lower leg: No edema.     Comments: No kyphosis   Lymphadenopathy:     Cervical: No cervical adenopathy.  Skin:    General: Skin is warm and dry.     Coloration: Skin is not pale.     Findings: No erythema or rash.  Neurological:     Mental Status: She is alert. Mental status is at baseline.     Cranial Nerves: No cranial nerve deficit.     Motor: No abnormal muscle tone.     Coordination: Coordination normal.     Gait: Gait normal.     Deep Tendon Reflexes: Reflexes are normal and symmetric. Reflexes normal.  Psychiatric:        Mood and Affect: Mood normal.        Cognition and Memory: Cognition and memory normal.           Assessment & Plan:   Problem List Items Addressed This Visit      Other   Routine general medical examination at a health care facility - Primary    Reviewed health habits including diet and exercise and skin cancer prevention Reviewed appropriate screening tests for age  Also reviewed health mt list, fam hx and immunization status , as well as social and family history   See HPI Labs reviewed  Eating better and plans to use treadmill more Mammogram is scheduled for 06/27/19 Seeing gyn as well  Plans to get a covid vaccines when available      Burning sensation of mouth    Seeing oral specialist  Still has symptoms       Chronic cough    Has seen Dr Melvyn Novas Now taking H2 blocker and gabapentin Some improvement  Figuring out which foods worsen this       Elevated glucose level    Good A1C Lab Results  Component Value Date   HGBA1C 5.1  06/05/2019   disc imp of low glycemic diet and wt loss to prevent DM2       Low HDL (under 40)    Improved this check Disc goals for lipids and reasons to control them Rev last labs with pt Rev low sat fat diet in detail Commended  Enc to continue exercise

## 2019-06-12 NOTE — Assessment & Plan Note (Signed)
Good A1C Lab Results  Component Value Date   HGBA1C 5.1 06/05/2019   disc imp of low glycemic diet and wt loss to prevent DM2

## 2019-06-27 ENCOUNTER — Ambulatory Visit
Admission: RE | Admit: 2019-06-27 | Discharge: 2019-06-27 | Disposition: A | Discharge status: Home / Self Care | Payer: BC Managed Care – PPO | Source: Ambulatory Visit | Attending: Family Medicine | Admitting: Family Medicine

## 2019-06-27 DIAGNOSIS — Z1231 Encounter for screening mammogram for malignant neoplasm of breast: Secondary | ICD-10-CM | POA: Insufficient documentation

## 2019-07-04 ENCOUNTER — Encounter: Payer: Self-pay | Admitting: Obstetrics and Gynecology

## 2019-07-04 ENCOUNTER — Other Ambulatory Visit: Payer: Self-pay

## 2019-07-04 ENCOUNTER — Ambulatory Visit (INDEPENDENT_AMBULATORY_CARE_PROVIDER_SITE_OTHER): Payer: BC Managed Care – PPO | Admitting: Obstetrics and Gynecology

## 2019-07-04 VITALS — BP 116/70 | Ht 64.0 in | Wt 166.0 lb

## 2019-07-04 DIAGNOSIS — Z01419 Encounter for gynecological examination (general) (routine) without abnormal findings: Secondary | ICD-10-CM

## 2019-07-04 DIAGNOSIS — B373 Candidiasis of vulva and vagina: Secondary | ICD-10-CM

## 2019-07-04 DIAGNOSIS — B3731 Acute candidiasis of vulva and vagina: Secondary | ICD-10-CM

## 2019-07-04 DIAGNOSIS — Z1239 Encounter for other screening for malignant neoplasm of breast: Secondary | ICD-10-CM | POA: Diagnosis not present

## 2019-07-04 MED ORDER — FLUCONAZOLE 150 MG PO TABS: 150.0000 mg | ORAL_TABLET | Freq: Once | ORAL | 0 refills | Status: AC

## 2019-07-04 NOTE — Progress Notes (Signed)
Gynecology Annual Exam  PCP: Abner Greenspan, MD  Chief Complaint:  Chief Complaint  Patient presents with  . Gynecologic Exam    Vaginal irritation    History of Present Illness: Patient is a 49 y.o. [id number] presents for annual exam. The patient has no complaints today.   LMP: Patient's last menstrual period was 01/08/2017. Status post hysterectomy no menstrual concerns.  Has noted some vaginal pruritus.     The patient is sexually active. She currently uses status post hysterectomy for contraception. She denies dyspareunia.  The patient does perform self breast exams.  There is no notable family history of breast or ovarian cancer in her family.  The patient wears seatbelts: yes.   The patient has regular exercise: not asked.    The patient denies current symptoms of depression.    Review of Systems: Review of Systems  Constitutional: Negative.   HENT: Positive for sore throat. Negative for congestion, ear discharge, ear pain, hearing loss, nosebleeds, sinus pain and tinnitus.   Respiratory: Negative.  Negative for stridor.   Cardiovascular: Negative.   Gastrointestinal: Negative.   Genitourinary: Negative.   Musculoskeletal: Negative.   Skin: Positive for itching. Negative for rash.  Psychiatric/Behavioral: Negative.     Past Medical History:  Patient Active Problem List   Diagnosis Date Noted  . Perimenopause 06/08/2018  . Low HDL (under 40) 06/08/2018  . Kress intestinal bacterial overgrowth 02/09/2018    + lactulose H2 breath test  34 ppm increase H2 and 8 ppm methane  Total is 42 and NL < 15  Rx Xifaxan   . Abdominal bloating   . Cyst of right kidney 06/04/2017  . RUQ abdominal pain 05/31/2017  . Elevated glucose level 05/22/2017  . S/P laparoscopic hysterectomy 01/14/2017  . Abdominal pain 07/27/2016  . Daily headache 06/01/2016  . Chronic cough 06/01/2016    Onset Dec 2019  - Allergy profile 05/19/19 >  Eos 0.1 /  IgE  8 - gabapentin 100 tid plus  h2 bid  rec 05/20/2019 >>>    . Encounter for routine gynecological examination 05/23/2014  . Burning sensation of mouth 09/12/2013  . Routine general medical examination at a health care facility 01/05/2011  . KIDNEY STONE 03/27/2010    Qualifier: Diagnosis of  By: Lorelei Pont MD, Frederico Hamman     . Atypical chest pain 07/18/2008    Onset F3b 2019 = 4 months p abd hysterectomy for dysmenorrhea  - trial of ibs diet/ citrucel 05/19/2019 >>>       . STRESS REACTION, ACUTE, WITH EMOTIONAL DISTURBANCE 09/13/2007    Qualifier: Diagnosis of  By: Glori Bickers MD, Carmell Austria  Qualifier: Diagnosis of  By: Lia Hopping LPN, [place]     . HEADACHE 09/13/2007    Qualifier: Diagnosis of  By: Glori Bickers MD, Carmell Austria      Past Surgical History:  Past Surgical History:  Procedure Laterality Date  . CHOLECYSTECTOMY  06/1994  . COLONOSCOPY WITH PROPOFOL N/A 07/12/2017   Procedure: COLONOSCOPY WITH PROPOFOL;  Surgeon: Lin Landsman, MD;  Location: [facility] ENDOSCOPY;  Service: Gastroenterology;  Laterality: N/A;  . CYSTOSCOPY N/A 01/14/2017   Procedure: CYSTOSCOPY;  Surgeon: Malachy Mood, MD;  Location: [facility] ORS;  Service: Gynecology;  Laterality: N/A;  . ESOPHAGOGASTRODUODENOSCOPY (EGD) WITH PROPOFOL N/A 07/12/2017   Procedure: ESOPHAGOGASTRODUODENOSCOPY (EGD) WITH PROPOFOL;  Surgeon: Lin Landsman, MD;  Location: [facility] ENDOSCOPY;  Service: Gastroenterology;  Laterality: N/A;  . KIDNEY STONE SURGERY    . LITHOTRIPSY  03/2006  .  TONSILECTOMY, ADENOIDECTOMY, BILATERAL MYRINGOTOMY AND TUBES     myringotomy tubes  . TOTAL LAPAROSCOPIC HYSTERECTOMY WITH SALPINGECTOMY Bilateral 01/14/2017   Procedure: HYSTERECTOMY TOTAL LAPAROSCOPIC WITH SALPINGECTOMY;  Surgeon: Malachy Mood, MD;  Location: [facility] ORS;  Service: Gynecology;  Laterality: Bilateral;    Gynecologic History:  Patient's last menstrual period was 01/08/2017. Contraception: status post hysterectomy Last Pap: Results were: 11/25/2016 NIL and HR HPV  negative  Last mammogram: 06/27/2019 Results were: BI-RAD I  Obstetric [id number]  Family History:  Family History  Problem Relation Age of Onset  . Migraines Mother   . Breast cancer Paternal Aunt        Contact  . Cancer Maternal Grandmother 76       brain tumor/breast cancer  . Breast cancer Maternal Grandmother   . Heart disease Maternal Grandfather        heart problems  . Colon cancer Neg Hx   . Esophageal cancer Neg Hx   . Liver disease Neg Hx   . Pancreatic cancer Neg Hx   . Stomach cancer Neg Hx   . Inflammatory bowel disease Neg Hx     Social History:  Social History   Socioeconomic History  . Marital status: Married    Spouse name: Not on file  . Number of children: 2  . Years of education: Not on file  . Highest education level: Not on file  Occupational History  . Occupation: Optician, dispensing: [employer]  Tobacco Use  . Smoking status: Never Smoker  . Smokeless tobacco: Never Used  Substance and Sexual Activity  . Alcohol use: No    Alcohol/week: 0.0 standard drinks  . Drug use: No  . Sexual activity: Yes    Birth control/protection: None  Other Topics Concern  . Not on file  Social History Narrative   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, she is an Theatre manager   Rare caffeine and never smoker no alcohol no drug or substance use   Social Determinants of Radio broadcast assistant Strain:   . Difficulty of Paying Living Expenses:   Food Insecurity:   . Worried About Charity fundraiser in the Last Year:   . Arboriculturist in the Last Year:   Transportation Needs:   . Film/video editor (Medical):   Marland Kitchen Lack of Transportation (Non-Medical):   Physical Activity:   . Days of Exercise per Week:   . Minutes of Exercise per Session:   Stress:   . Feeling of Stress :   Social Connections:   . Frequency of Communication with Friends and Family:   . Frequency of Social Gatherings with Friends and  Family:   . Attends Religious Services:   . Active Member of Clubs or Organizations:   . Attends Archivist Meetings:   Marland Kitchen Marital Status:   Intimate Partner Violence:   . Fear of Current or Ex-Partner:   . Emotionally Abused:   Marland Kitchen Physically Abused:   . Sexually Abused:     Allergies:  Allergies  Allergen Reactions  . Amoxicillin Anaphylaxis  . Bee Venom Anaphylaxis  . Sulfa Antibiotics Anaphylaxis  . Sulfonamide Derivatives Anaphylaxis    Lungs close up  . Penicillins Rash    Has patient had a PCN reaction causing immediate rash, facial/tongue/throat swelling, SOB or lightheadedness with hypotension: No Has patient had a PCN reaction causing severe rash involving mucus membranes or skin necrosis: No Has  patient had a PCN reaction that required hospitalization: No Has patient had a PCN reaction occurring within the last 10 years: No If all of the above answers are "NO", then may proceed with Cephalosporin use.     Medications: Prior to Admission medications   Medication Sig Start Date End Date Taking? Authorizing Provider  acetaminophen (TYLENOL) 325 MG tablet Take 650 mg by mouth every 6 (six) hours as needed.    [provider]  benzonatate (TESSALON) 200 MG capsule Take 1 capsule (200 mg total) by mouth 3 (three) times daily as needed for cough. 05/19/19   Tanda Rockers, MD  famotidine (PEPCID) 20 MG tablet One in am and after supper 05/19/19   Tanda Rockers, MD  gabapentin (NEURONTIN) 100 MG capsule Take 1 capsule (100 mg total) by mouth 3 (three) times daily. One three times daily 05/19/19   Tanda Rockers, MD  zinc gluconate 50 MG tablet Take 1 tablet by mouth daily.    [provider]    Physical Exam Vitals: Blood pressure 116/70, height 5\' 4"  (1.626 m), weight 166 lb (75.3 kg), last menstrual period 01/08/2017. Body mass index is 28.49 kg/m.   General: NAD HEENT: normocephalic, anicteric Thyroid: no enlargement, no palpable  nodules Pulmonary: No increased work of breathing, CTAB Cardiovascular: RRR, distal pulses 2+ Breast: Breast symmetrical, no tenderness, no palpable nodules or masses, no skin or nipple retraction present, no nipple discharge.  No axillary or supraclavicular lymphadenopathy. Abdomen: NABS, soft, non-tender, non-distended.  Umbilicus without lesions.  No hepatomegaly, splenomegaly or masses palpable. No evidence of hernia  Genitourinary:  External: Normal external female genitalia.  Normal urethral meatus, normal Bartholin's and Skene's glands.    Vagina: Normal vaginal mucosa, no evidence of prolapse.    Cervix: surgically absent  Uterus: surgically absent  Adnexa: ovaries non-enlarged, no adnexal masses  Rectal: deferred  Lymphatic: no evidence of inguinal lymphadenopathy Extremities: no edema, erythema, or tenderness Neurologic: Grossly intact Psychiatric: mood appropriate, affect full  Female chaperone present for pelvic and breast  portions of the physical exam    Assessment: 49 y.o. G2P2002 routine annual exam  Plan: Problem List Items Addressed This Visit      Other   Encounter for routine gynecological examination - Primary    Other Visit Diagnoses    Breast screening       Vaginal candida       Relevant Medications   fluconazole (DIFLUCAN) 150 MG tablet      1) Mammogram - recommend yearly screening mammogram.  Mammogram Is up to date   2) STI screening  was notoffered and therefore not obtained  3) ASCCP guidelines and rational discussed.  Patient opts for every 3 years screening interval  4) Contraception - the patient is currently using  status post hysterectomy.  She is not currently in need of contraception secondary to being sterile  5) Colonoscopy -- Screening recommended starting at age 40 for average risk individuals, age 73 for individuals deemed at increased risk (including African Americans) and recommended to continue until age 98.  For patient age  63-85 individualized approach is recommended.  Gold standard screening is via colonoscopy, Cologuard screening is an acceptable alternative for patient unwilling or unable to undergo colonoscopy.  "Colorectal cancer screening for average?risk adults: 2018 guideline update from the American Cancer Society"CA: A Cancer Journal for Clinicians: Sep 09, 2016   6) Routine healthcare maintenance including cholesterol, diabetes screening discussed managed by PCP  7) Return in  about 1 year (around 07/03/2020) for annual.   Malachy Mood, MD, [place], [facility] 07/04/2019, 3:55 PM

## 2019-07-11 DIAGNOSIS — E10649 Type 1 diabetes mellitus with hypoglycemia without coma: Secondary | ICD-10-CM | POA: Diagnosis not present

## 2019-07-11 DIAGNOSIS — E049 Nontoxic goiter, unspecified: Secondary | ICD-10-CM | POA: Diagnosis not present

## 2019-07-11 DIAGNOSIS — E063 Autoimmune thyroiditis: Secondary | ICD-10-CM | POA: Diagnosis not present

## 2019-07-11 DIAGNOSIS — E1065 Type 1 diabetes mellitus with hyperglycemia: Secondary | ICD-10-CM | POA: Diagnosis not present

## 2019-07-20 ENCOUNTER — Encounter: Payer: Self-pay | Admitting: Family Medicine

## 2019-07-20 ENCOUNTER — Telehealth (INDEPENDENT_AMBULATORY_CARE_PROVIDER_SITE_OTHER): Payer: BC Managed Care – PPO | Admitting: Family Medicine

## 2019-07-20 VITALS — BP 121/77 | HR 67 | Wt 163.2 lb

## 2019-07-20 DIAGNOSIS — R4589 Other symptoms and signs involving emotional state: Secondary | ICD-10-CM

## 2019-07-20 NOTE — Progress Notes (Signed)
Virtual Visit via Video Note  I connected with Courtney Pineda on 07/20/19 at 10:45 AM EDT by a video enabled telemedicine application and verified that I am speaking with the correct person using two identifiers.  Location: Patient: home Provider: office    I discussed the limitations of evaluation and management by telemedicine and the availability of in person appointments. The patient expressed understanding and agreed to proceed.  Parties involved in encounter  Patient: Courtney Pineda   Provider:  Loura Pardon MD   History of Present Illness: Presents for stress reaction   Teenage daughter is more volitile lately Biochemist, clinical) More arguments   She called her "psycho" - does not know what that means  Things have been building up  Husband can add to this also   Example : daughter needs to finish what she started with girl scouts (was loosing interest)  Is senior in [employer]  Mother has to nag her to finish a simple project  Thinks she is nagging her  She is dealing with missing out on senior year due to the pandemic   Son- has to remind to hang up clothes etc   They do not listen to her  They accuse her of nagging and yelling at them   She has very little time for self care  Wants to start walking  Hard to get her husband on board  She works from home-this is harder  Not happy with her appearance or weight  Motivation is not there     Patient Active Problem List   Diagnosis Date Noted  . Perimenopause 06/08/2018  . Low HDL (under 40) 06/08/2018  . Carie intestinal bacterial overgrowth 02/09/2018  . Abdominal bloating   . Cyst of right kidney 06/04/2017  . RUQ abdominal pain 05/31/2017  . Elevated glucose level 05/22/2017  . S/P laparoscopic hysterectomy 01/14/2017  . Abdominal pain 07/27/2016  . Daily headache 06/01/2016  . Chronic cough 06/01/2016  . Encounter for routine gynecological examination 05/23/2014  . Burning sensation of mouth 09/12/2013  . Routine  general medical examination at a health care facility 01/05/2011  . KIDNEY STONE 03/27/2010  . Atypical chest pain 07/18/2008  . STRESS REACTION, ACUTE, WITH EMOTIONAL DISTURBANCE 09/13/2007  . HEADACHE 09/13/2007   Past Medical History:  Diagnosis Date  . Anemia   . Atypical moles    abnormal moles removed from neck.  . Chronic right upper quadrant pain   . History of kidney stones   . Hyperemesis gravidarum 1999  . Grein intestinal bacterial overgrowth 02/09/2018   Past Surgical History:  Procedure Laterality Date  . CHOLECYSTECTOMY  06/1994  . COLONOSCOPY WITH PROPOFOL N/A 07/12/2017   Procedure: COLONOSCOPY WITH PROPOFOL;  Surgeon: Lin Landsman, MD;  Location: [facility] ENDOSCOPY;  Service: Gastroenterology;  Laterality: N/A;  . CYSTOSCOPY N/A 01/14/2017   Procedure: CYSTOSCOPY;  Surgeon: Malachy Mood, MD;  Location: [facility] ORS;  Service: Gynecology;  Laterality: N/A;  . ESOPHAGOGASTRODUODENOSCOPY (EGD) WITH PROPOFOL N/A 07/12/2017   Procedure: ESOPHAGOGASTRODUODENOSCOPY (EGD) WITH PROPOFOL;  Surgeon: Lin Landsman, MD;  Location: [facility] ENDOSCOPY;  Service: Gastroenterology;  Laterality: N/A;  . KIDNEY STONE SURGERY    . LITHOTRIPSY  03/2006  . TONSILECTOMY, ADENOIDECTOMY, BILATERAL MYRINGOTOMY AND TUBES     myringotomy tubes  . TOTAL LAPAROSCOPIC HYSTERECTOMY WITH SALPINGECTOMY Bilateral 01/14/2017   Procedure: HYSTERECTOMY TOTAL LAPAROSCOPIC WITH SALPINGECTOMY;  Surgeon: Malachy Mood, MD;  Location: [facility] ORS;  Service: Gynecology;  Laterality: Bilateral;   Social History  Tobacco Use  . Smoking status: Never Smoker  . Smokeless tobacco: Never Used  Substance Use Topics  . Alcohol use: No    Alcohol/week: 0.0 standard drinks  . Drug use: No   Family History  Problem Relation Age of Onset  . Migraines Mother   . Breast cancer Paternal Aunt        Contact  . Cancer Maternal Grandmother 71       brain tumor/breast cancer  . Breast cancer Maternal  Grandmother   . Heart disease Maternal Grandfather        heart problems  . Colon cancer Neg Hx   . Esophageal cancer Neg Hx   . Liver disease Neg Hx   . Pancreatic cancer Neg Hx   . Stomach cancer Neg Hx   . Inflammatory bowel disease Neg Hx    Allergies  Allergen Reactions  . Amoxicillin Anaphylaxis  . Bee Venom Anaphylaxis  . Sulfa Antibiotics Anaphylaxis  . Sulfonamide Derivatives Anaphylaxis    Lungs close up  . Penicillins Rash    Has patient had a PCN reaction causing immediate rash, facial/tongue/throat swelling, SOB or lightheadedness with hypotension: No Has patient had a PCN reaction causing severe rash involving mucus membranes or skin necrosis: No Has patient had a PCN reaction that required hospitalization: No Has patient had a PCN reaction occurring within the last 10 years: No If all of the above answers are "NO", then may proceed with Cephalosporin use.    Current Outpatient Medications on File Prior to Visit  Medication Sig Dispense Refill  . acetaminophen (TYLENOL) 325 MG tablet Take 650 mg by mouth every 6 (six) hours as needed.    . benzonatate (TESSALON) 200 MG capsule Take 1 capsule (200 mg total) by mouth 3 (three) times daily as needed for cough. 30 capsule 1  . famotidine (PEPCID) 20 MG tablet One in am and after supper 60 tablet 11  . gabapentin (NEURONTIN) 100 MG capsule Take 1 capsule (100 mg total) by mouth 3 (three) times daily. One three times daily 90 capsule 2  . zinc gluconate 50 MG tablet Take 1 tablet by mouth daily.     No current facility-administered medications on file prior to visit.   Review of Systems  Constitutional: Negative for chills, fever and malaise/fatigue.  HENT: Negative for congestion, ear pain, sinus pain and sore throat.   Eyes: Negative for blurred vision, discharge and redness.  Respiratory: Negative for cough, shortness of breath and stridor.   Cardiovascular: Negative for chest pain, palpitations and leg swelling.   Gastrointestinal: Negative for abdominal pain, diarrhea, nausea and vomiting.  Musculoskeletal: Negative for myalgias.  Skin: Negative for rash.  Neurological: Negative for dizziness and headaches.  Psychiatric/Behavioral: Negative for substance abuse and suicidal ideas. The patient is nervous/anxious.     Observations/Objective: Patient appears well, in no distress Weight is baseline  No facial swelling or asymmetry Normal voice-not hoarse and no slurred speech No obvious tremor or mobility impairment Moving neck and UEs normally Able to hear the call well  No cough or shortness of breath during interview  Talkative and mentally sharp with no cognitive changes No skin changes on face or neck , no rash or pallor Affect is anxious and tearful (througout interview)- though she is a very good historian  Pt discusses stressors and symptoms candidly   Assessment and Plan: Problem List Items Addressed This Visit      Other   STRESS REACTION, ACUTE, WITH EMOTIONAL DISTURBANCE -  Primary    Struggling with anxiety when dealing with family stressors (teenage daughter in particular)  Having trouble stepping back / giving independence and accused of being unfair and a nag  This bothers her much  Also overwhelmed at times with responsibilities of household  Reviewed stressors/ coping techniques/symptoms/ support sources/ tx options and side effects in detail today  Discussed options for treatment in detail  Encouraged better self care with more exercise/sleep/outddor time  Meditation if helpful  Referral made for one on one counseling (doubts her daughter would be open to a family session)  If no improvement - can discuss medication (would consider buspar or ssri) inst pt to alert [place] if any changes /worsening or symptoms of depression        Relevant Orders   Ambulatory referral to Psychology       Follow Up Instructions: I placed a referral for you for psychological counseling and  feel that would help  The office will call you to arrange this  Make an effort to prioritize your health/ practice self care  More outdoor time and exercise would benefit you along with a healthy diet  Keep talking to supportive family and friends Avoid excessive caffeine which can worsen anxiety and impair sleep  We can discuss medication in the future if no improvement or worse Please keep [place] posted     I discussed the assessment and treatment plan with the patient. The patient was provided an opportunity to ask questions and all were answered. The patient agreed with the plan and demonstrated an understanding of the instructions.   The patient was advised to call back or seek an in-person evaluation if the symptoms worsen or if the condition fails to improve as anticipated.  Loura Pardon, MD

## 2019-07-20 NOTE — Assessment & Plan Note (Signed)
Struggling with anxiety when dealing with family stressors (teenage daughter in particular)  Having trouble stepping back / giving independence and accused of being unfair and a nag  This bothers her much  Also overwhelmed at times with responsibilities of household  Reviewed stressors/ coping techniques/symptoms/ support sources/ tx options and side effects in detail today  Discussed options for treatment in detail  Encouraged better self care with more exercise/sleep/outddor time  Meditation if helpful  Referral made for one on one counseling (doubts her daughter would be open to a family session)  If no improvement - can discuss medication (would consider buspar or ssri) inst pt to alert [place] if any changes /worsening or symptoms of depression

## 2019-07-20 NOTE — Patient Instructions (Signed)
I placed a referral for you for psychological counseling and feel that would help  The office will call you to arrange this  Make an effort to prioritize your health/ practice self care  More outdoor time and exercise would benefit you along with a healthy diet  Keep talking to supportive family and friends Avoid excessive caffeine which can worsen anxiety and impair sleep  We can discuss medication in the future if no improvement or worse Please keep [place] posted

## 2019-07-21 ENCOUNTER — Ambulatory Visit: Payer: BC Managed Care – PPO | Admitting: Family Medicine

## 2019-07-25 ENCOUNTER — Telehealth: Payer: Self-pay | Admitting: *Deleted

## 2019-07-25 NOTE — Telephone Encounter (Signed)
I will see her then  

## 2019-07-25 NOTE — Telephone Encounter (Signed)
Pt reports anxiety concerning the J & J vaccine she received on 4/7. Pt denies severe HA, leg or abdominal pain or SOB. Pt is also concerned that her family has also received the vaccine. Pt reports she did have a slight fever (99.1) the day after the vaccine and some fatigue but only lasted one day, but denies any other symptoms. Advised to call 911 or go to the ER if she had any above mentioned symptoms.  Pt requested an apt to discuss vaccine further. Scheduled pt for tomorrow with PCP.  Pt appreciative and verbalized understanding.

## 2019-07-25 NOTE — Telephone Encounter (Signed)
Patient left a voicemail stating that she took the J & J covid vaccine last Wednesday. Patient stated that she has concerns and questions. Patient requested a call back from Dr. Glori Bickers.

## 2019-07-26 ENCOUNTER — Ambulatory Visit: Payer: BC Managed Care – PPO | Admitting: Family Medicine

## 2019-07-26 ENCOUNTER — Other Ambulatory Visit: Payer: Self-pay

## 2019-07-26 ENCOUNTER — Encounter: Payer: Self-pay | Admitting: Family Medicine

## 2019-07-26 VITALS — BP 96/64 | HR 63 | Temp 97.9°F | Ht 64.0 in | Wt 164.0 lb

## 2019-07-26 DIAGNOSIS — R4589 Other symptoms and signs involving emotional state: Secondary | ICD-10-CM | POA: Diagnosis not present

## 2019-07-26 DIAGNOSIS — T50Z95A Adverse effect of other vaccines and biological substances, initial encounter: Secondary | ICD-10-CM

## 2019-07-26 NOTE — Progress Notes (Signed)
Subjective:    Patient ID: Courtney Pineda, female    DOB: Dec 31, 1970, 49 y.o.   MRN: [id number]  This visit occurred during the SARS-CoV-2 public health emergency.  Safety protocols were in place, including screening questions prior to the visit, additional usage of staff PPE, and extensive cleaning of exam room while observing appropriate contact time as indicated for disinfecting solutions.    HPI Pt presents to discuss the J and J covid vaccination   Wt Readings from Last 3 Encounters:  07/26/19 164 lb (74.4 kg)  07/20/19 163 lb 4 oz (74 kg)  07/04/19 166 lb (75.3 kg)   28.15 kg/m   She had the J and J vaccine on 07/19/19 (adenovirus vector vaccine) She experienced a slt fever 99.1 the next day with some fatigue and headache Better after that -fine on Friday   Arm is still mildly sore   Other family members had similar symptoms  No severe headache or abd pain  No leg pain or sob (not new anyway)    Family also received vaccine - her daughter was quite upset  It caused fighting   Anxiety provoking in addition to her family issues  She got a call to schedule her first counseling appt     BP Readings from Last 3 Encounters:  07/26/19 96/64  07/20/19 121/77  07/04/19 116/70   Pulse Readings from Last 3 Encounters:  07/26/19 63  07/20/19 67  06/12/19 71   Patient Active Problem List   Diagnosis Date Noted  . Vaccine reaction 07/26/2019  . Perimenopause 06/08/2018  . Low HDL (under 40) 06/08/2018  . [place] intestinal bacterial overgrowth 02/09/2018  . Abdominal bloating   . Cyst of right kidney 06/04/2017  . RUQ abdominal pain 05/31/2017  . Elevated glucose level 05/22/2017  . S/P laparoscopic hysterectomy 01/14/2017  . Abdominal pain 07/27/2016  . Daily headache 06/01/2016  . Chronic cough 06/01/2016  . Encounter for routine gynecological examination 05/23/2014  . Burning sensation of mouth 09/12/2013  . Routine general medical examination at a  health care facility 01/05/2011  . KIDNEY STONE 03/27/2010  . Atypical chest pain 07/18/2008  . STRESS REACTION, ACUTE, WITH EMOTIONAL DISTURBANCE 09/13/2007  . HEADACHE 09/13/2007   Past Medical History:  Diagnosis Date  . Anemia   . Atypical moles    abnormal moles removed from neck.  . Chronic right upper quadrant pain   . History of kidney stones   . Hyperemesis gravidarum 1999  . Wilmer intestinal bacterial overgrowth 02/09/2018   Past Surgical History:  Procedure Laterality Date  . CHOLECYSTECTOMY  06/1994  . COLONOSCOPY WITH PROPOFOL N/A 07/12/2017   Procedure: COLONOSCOPY WITH PROPOFOL;  Surgeon: Lin Landsman, MD;  Location: [facility] ENDOSCOPY;  Service: Gastroenterology;  Laterality: N/A;  . CYSTOSCOPY N/A 01/14/2017   Procedure: CYSTOSCOPY;  Surgeon: Malachy Mood, MD;  Location: [facility] ORS;  Service: Gynecology;  Laterality: N/A;  . ESOPHAGOGASTRODUODENOSCOPY (EGD) WITH PROPOFOL N/A 07/12/2017   Procedure: ESOPHAGOGASTRODUODENOSCOPY (EGD) WITH PROPOFOL;  Surgeon: Lin Landsman, MD;  Location: [facility] ENDOSCOPY;  Service: Gastroenterology;  Laterality: N/A;  . KIDNEY STONE SURGERY    . LITHOTRIPSY  03/2006  . TONSILECTOMY, ADENOIDECTOMY, BILATERAL MYRINGOTOMY AND TUBES     myringotomy tubes  . TOTAL LAPAROSCOPIC HYSTERECTOMY WITH SALPINGECTOMY Bilateral 01/14/2017   Procedure: HYSTERECTOMY TOTAL LAPAROSCOPIC WITH SALPINGECTOMY;  Surgeon: Malachy Mood, MD;  Location: [facility] ORS;  Service: Gynecology;  Laterality: Bilateral;   Social History   Tobacco Use  . Smoking  status: Never Smoker  . Smokeless tobacco: Never Used  Substance Use Topics  . Alcohol use: No    Alcohol/week: 0.0 standard drinks  . Drug use: No   Family History  Problem Relation Age of Onset  . Migraines Mother   . Breast cancer Paternal Aunt        Contact  . Cancer Maternal Grandmother 48       brain tumor/breast cancer  . Breast cancer Maternal Grandmother   . Heart disease Maternal  Grandfather        heart problems  . Colon cancer Neg Hx   . Esophageal cancer Neg Hx   . Liver disease Neg Hx   . Pancreatic cancer Neg Hx   . Stomach cancer Neg Hx   . Inflammatory bowel disease Neg Hx    Allergies  Allergen Reactions  . Amoxicillin Anaphylaxis  . Bee Venom Anaphylaxis  . Sulfa Antibiotics Anaphylaxis  . Sulfonamide Derivatives Anaphylaxis    Lungs close up  . Penicillins Rash    Has patient had a PCN reaction causing immediate rash, facial/tongue/throat swelling, SOB or lightheadedness with hypotension: No Has patient had a PCN reaction causing severe rash involving mucus membranes or skin necrosis: No Has patient had a PCN reaction that required hospitalization: No Has patient had a PCN reaction occurring within the last 10 years: No If all of the above answers are "NO", then may proceed with Cephalosporin use.    Current Outpatient Medications on File Prior to Visit  Medication Sig Dispense Refill  . acetaminophen (TYLENOL) 325 MG tablet Take 650 mg by mouth every 6 (six) hours as needed.    . benzonatate (TESSALON) 200 MG capsule Take 1 capsule (200 mg total) by mouth 3 (three) times daily as needed for cough. 30 capsule 1  . famotidine (PEPCID) 20 MG tablet One in am and after supper 60 tablet 11  . gabapentin (NEURONTIN) 100 MG capsule Take 1 capsule (100 mg total) by mouth 3 (three) times daily. One three times daily 90 capsule 2  . zinc gluconate 50 MG tablet Take 1 tablet by mouth daily.     No current facility-administered medications on file prior to visit.     Review of Systems  Constitutional: Negative for activity change, appetite change, fatigue, fever and unexpected weight change.  HENT: Negative for congestion, ear pain, rhinorrhea, sinus pressure and sore throat.   Eyes: Negative for pain, redness and visual disturbance.  Respiratory: Negative for cough, shortness of breath and wheezing.   Cardiovascular: Negative for chest pain and  palpitations.  Gastrointestinal: Negative for abdominal pain, blood in stool, constipation and diarrhea.  Endocrine: Negative for polydipsia and polyuria.  Genitourinary: Negative for dysuria, frequency and urgency.  Musculoskeletal: Negative for arthralgias, back pain and myalgias.  Skin: Negative for pallor and rash.  Allergic/Immunologic: Negative for environmental allergies.  Neurological: Negative for dizziness, syncope and headaches.       Mild headache is gone now   Hematological: Negative for adenopathy. Does not bruise/bleed easily.  Psychiatric/Behavioral: Positive for dysphoric mood. Negative for decreased concentration. The patient is nervous/anxious.        Objective:   Physical Exam Constitutional:      General: She is not in acute distress.    Appearance: Normal appearance. She is well-developed and normal weight. She is not ill-appearing.  HENT:     Head: Normocephalic and atraumatic.     Mouth/Throat:     Mouth: Mucous membranes are moist.  Eyes:     General: No scleral icterus.    Conjunctiva/sclera: Conjunctivae normal.     Pupils: Pupils are equal, round, and reactive to light.  Neck:     Thyroid: No thyromegaly.     Vascular: No carotid bruit or JVD.  Cardiovascular:     Rate and Rhythm: Normal rate and regular rhythm.     Heart sounds: Normal heart sounds. No gallop.   Pulmonary:     Effort: Pulmonary effort is normal. No respiratory distress.     Breath sounds: Normal breath sounds. No wheezing or rales.  Chest:     Chest wall: No tenderness.  Abdominal:     General: Bowel sounds are normal. There is no distension or abdominal bruit.     Palpations: Abdomen is soft. There is no mass.     Tenderness: There is no abdominal tenderness.  Musculoskeletal:        General: No tenderness.     Cervical back: Normal range of motion and neck supple. No rigidity or tenderness.     Right lower leg: No edema.     Left lower leg: No edema.  Lymphadenopathy:      Cervical: No cervical adenopathy.  Skin:    General: Skin is warm and dry.     Findings: No rash.  Neurological:     Mental Status: She is alert. Mental status is at baseline.     Coordination: Coordination normal.     Deep Tendon Reflexes: Reflexes are normal and symmetric. Reflexes normal.  Psychiatric:        Mood and Affect: Mood is anxious. Affect is tearful.     Comments: Pleasant  Tearful when discussing stressors           Assessment & Plan:   Problem List Items Addressed This Visit      Other   STRESS REACTION, ACUTE, WITH EMOTIONAL DISTURBANCE - Primary    More anxiety caused by media re: J and J vaccine which she had Reassured today  Has her first counseling session pending      Vaccine reaction    Mild reaction with headache/low grade temp the day after J and J vaccine on 4/7 Reassured, this is typical  Disc findings that lead to hold on further admin (clotting/low platelet)  This is a rare phenomenon and I rev s/s to watch for including severe headache/confusion/abd or leg pain or sob She will alert [place] if any of that develops and go to ER

## 2019-07-26 NOTE — Patient Instructions (Addendum)
Symptoms to watch for include severe headache, abdominal pain, leg pain or sob  Cool and warm compresses can help arm pain   Exam is re assuring   If any problems or questions call and let us know

## 2019-07-26 NOTE — Assessment & Plan Note (Signed)
Mild reaction with headache/low grade temp the day after J and J vaccine on 4/7 Reassured, this is typical  Disc findings that lead to hold on further admin (clotting/low platelet)  This is a rare phenomenon and I rev s/s to watch for including severe headache/confusion/abd or leg pain or sob She will alert [place] if any of that develops and go to ER

## 2019-07-26 NOTE — Assessment & Plan Note (Signed)
More anxiety caused by media re: J and J vaccine which she had Reassured today  Has her first counseling session pending

## 2019-08-25 ENCOUNTER — Other Ambulatory Visit: Payer: Self-pay

## 2019-08-25 ENCOUNTER — Encounter: Payer: Self-pay | Admitting: Family Medicine

## 2019-08-25 ENCOUNTER — Ambulatory Visit: Payer: BC Managed Care – PPO | Admitting: Family Medicine

## 2019-08-25 DIAGNOSIS — R21 Rash and other nonspecific skin eruption: Secondary | ICD-10-CM | POA: Diagnosis not present

## 2019-08-25 MED ORDER — PREDNISONE 10 MG PO TABS: ORAL_TABLET | ORAL | 0 refills | Status: DC

## 2019-08-25 NOTE — Patient Instructions (Addendum)
Take prednisone as directed-tapering dose  It will make you hyper and hungry   Get generic zyrtec (cetrizine) over the counter 10 mg and take one daily   Cut nails short   Use non scented soaps/detergents and moisturizers  Do not use any fabric softeners  Avoid hot water /keep it tepid and try not to get hot    Watch for fleas on dogs in house   Update if not starting to improve in a week or if worsening    If you find a tick bite please let me know

## 2019-08-25 NOTE — Progress Notes (Signed)
Subjective:    Patient ID: Courtney Pineda, female    DOB: January 01, 1971, 49 y.o.   MRN: [id number]  This visit occurred during the SARS-CoV-2 public health emergency.  Safety protocols were in place, including screening questions prior to the visit, additional usage of staff PPE, and extensive cleaning of exam room while observing appropriate contact time as indicated for disinfecting solutions.    HPI Pt presents with insect bites vs rash   Started about a week ago -first spots arms or stomach Bumps  Steadily worsened -now all over   No outdoor activity  No poison ivy exp No tick bites   Face -spared Scalp- spared  Palms and soles are spared  None in web spaces   Does have on genital area   No otc meds    In house or gym and that is it    Itching /worse at night   Wondered about fleas- dogs came back in the house/has not seen fleas and they are on anti flea treatment   No traveling before this   Possible seasonal allergies- occ sneezing   Patient Active Problem List   Diagnosis Date Noted  . Rash and nonspecific skin eruption 08/25/2019  . Vaccine reaction 07/26/2019  . Perimenopause 06/08/2018  . Low HDL (under 40) 06/08/2018  . Scronce intestinal bacterial overgrowth 02/09/2018  . Abdominal bloating   . Cyst of right kidney 06/04/2017  . RUQ abdominal pain 05/31/2017  . Elevated glucose level 05/22/2017  . S/P laparoscopic hysterectomy 01/14/2017  . Abdominal pain 07/27/2016  . Daily headache 06/01/2016  . Chronic cough 06/01/2016  . Encounter for routine gynecological examination 05/23/2014  . Burning sensation of mouth 09/12/2013  . Routine general medical examination at a health care facility 01/05/2011  . KIDNEY STONE 03/27/2010  . Atypical chest pain 07/18/2008  . STRESS REACTION, ACUTE, WITH EMOTIONAL DISTURBANCE 09/13/2007  . HEADACHE 09/13/2007   Past Medical History:  Diagnosis Date  . Anemia   . Atypical moles    abnormal moles  removed from neck.  . Chronic right upper quadrant pain   . History of kidney stones   . Hyperemesis gravidarum 1999  . Stimpson intestinal bacterial overgrowth 02/09/2018   Past Surgical History:  Procedure Laterality Date  . CHOLECYSTECTOMY  06/1994  . COLONOSCOPY WITH PROPOFOL N/A 07/12/2017   Procedure: COLONOSCOPY WITH PROPOFOL;  Surgeon: Lin Landsman, MD;  Location: [facility] ENDOSCOPY;  Service: Gastroenterology;  Laterality: N/A;  . CYSTOSCOPY N/A 01/14/2017   Procedure: CYSTOSCOPY;  Surgeon: Malachy Mood, MD;  Location: [facility] ORS;  Service: Gynecology;  Laterality: N/A;  . ESOPHAGOGASTRODUODENOSCOPY (EGD) WITH PROPOFOL N/A 07/12/2017   Procedure: ESOPHAGOGASTRODUODENOSCOPY (EGD) WITH PROPOFOL;  Surgeon: Lin Landsman, MD;  Location: [facility] ENDOSCOPY;  Service: Gastroenterology;  Laterality: N/A;  . KIDNEY STONE SURGERY    . LITHOTRIPSY  03/2006  . TONSILECTOMY, ADENOIDECTOMY, BILATERAL MYRINGOTOMY AND TUBES     myringotomy tubes  . TOTAL LAPAROSCOPIC HYSTERECTOMY WITH SALPINGECTOMY Bilateral 01/14/2017   Procedure: HYSTERECTOMY TOTAL LAPAROSCOPIC WITH SALPINGECTOMY;  Surgeon: Malachy Mood, MD;  Location: [facility] ORS;  Service: Gynecology;  Laterality: Bilateral;   Social History   Tobacco Use  . Smoking status: Never Smoker  . Smokeless tobacco: Never Used  Substance Use Topics  . Alcohol use: No    Alcohol/week: 0.0 standard drinks  . Drug use: No   Family History  Problem Relation Age of Onset  . Migraines Mother   . Breast cancer Paternal Aunt  Contact  . Cancer Maternal Grandmother 43       brain tumor/breast cancer  . Breast cancer Maternal Grandmother   . Heart disease Maternal Grandfather        heart problems  . Colon cancer Neg Hx   . Esophageal cancer Neg Hx   . Liver disease Neg Hx   . Pancreatic cancer Neg Hx   . Stomach cancer Neg Hx   . Inflammatory bowel disease Neg Hx    Allergies  Allergen Reactions  . Amoxicillin Anaphylaxis  .  Bee Venom Anaphylaxis  . Sulfa Antibiotics Anaphylaxis  . Sulfonamide Derivatives Anaphylaxis    Lungs close up  . Penicillins Rash    Has patient had a PCN reaction causing immediate rash, facial/tongue/throat swelling, SOB or lightheadedness with hypotension: No Has patient had a PCN reaction causing severe rash involving mucus membranes or skin necrosis: No Has patient had a PCN reaction that required hospitalization: No Has patient had a PCN reaction occurring within the last 10 years: No If all of the above answers are "NO", then may proceed with Cephalosporin use.    Current Outpatient Medications on File Prior to Visit  Medication Sig Dispense Refill  . acetaminophen (TYLENOL) 325 MG tablet Take 650 mg by mouth every 6 (six) hours as needed.    . famotidine (PEPCID) 20 MG tablet One in am and after supper 60 tablet 11  . gabapentin (NEURONTIN) 100 MG capsule Take 1 capsule (100 mg total) by mouth 3 (three) times daily. One three times daily 90 capsule 2  . zinc gluconate 50 MG tablet Take 1 tablet by mouth daily.    . benzonatate (TESSALON) 200 MG capsule Take 1 capsule (200 mg total) by mouth 3 (three) times daily as needed for cough. (Patient not taking: Reported on 08/25/2019) 30 capsule 1   No current facility-administered medications on file prior to visit.    Review of Systems  Constitutional: Negative for activity change, appetite change, fatigue, fever and unexpected weight change.  HENT: Negative for congestion, ear pain, rhinorrhea, sinus pressure and sore throat.   Eyes: Negative for pain, redness and visual disturbance.  Respiratory: Negative for cough, shortness of breath and wheezing.   Cardiovascular: Negative for chest pain and palpitations.  Gastrointestinal: Negative for abdominal pain, blood in stool, constipation and diarrhea.  Endocrine: Negative for polydipsia and polyuria.  Genitourinary: Negative for dysuria, frequency and urgency.  Musculoskeletal:  Negative for arthralgias, back pain and myalgias.  Skin: Positive for rash. Negative for pallor.  Allergic/Immunologic: Negative for environmental allergies.  Neurological: Negative for dizziness, syncope and headaches.  Hematological: Negative for adenopathy. Does not bruise/bleed easily.  Psychiatric/Behavioral: Negative for decreased concentration and dysphoric mood. The patient is nervous/anxious.        Objective:   Physical Exam Constitutional:      General: She is not in acute distress.    Appearance: Normal appearance. She is normal weight. She is not ill-appearing.  HENT:     Nose: Nose normal.     Mouth/Throat:     Pharynx: Oropharynx is clear.  Eyes:     General:        Right eye: No discharge.        Left eye: No discharge.     Pupils: Pupils are equal, round, and reactive to light.  Cardiovascular:     Rate and Rhythm: Normal rate and regular rhythm.     Pulses: Normal pulses.     Heart sounds: Normal  heart sounds.  Pulmonary:     Effort: Pulmonary effort is normal. No respiratory distress.     Breath sounds: Normal breath sounds. No wheezing or rales.  Musculoskeletal:        General: No swelling or tenderness.     Cervical back: Normal range of motion and neck supple.  Lymphadenopathy:     Cervical: No cervical adenopathy.  Skin:    Comments: Diffuse papular rash (papules are pink and un blanching) On trunk and extremities Some lesions dry  No vesicles or whelps or confluent color changes  No lesions in web spaces  Mild in axillae None on mucous membranes  Little to no scale    Neurological:     Mental Status: She is alert.     Sensory: No sensory deficit.     Motor: No weakness.  Psychiatric:        Mood and Affect: Mood normal.           Assessment & Plan:   Problem List Items Addressed This Visit      Musculoskeletal and Integument   Rash and nonspecific skin eruption    Diffuse papular rash on trunk and extremities  Spares  palms/soles/face and scalp Itchy  No known exp to insects/no tick bites/new chemicals or foods or medications  Resembles reaction/allergic  tx with prednisone Also zyrtec otc  See avs-adv avoidance of hot conditions and scented products  Keep journal of symptoms and exposures Update if not starting to improve in a week or if worsening

## 2019-08-26 NOTE — Assessment & Plan Note (Signed)
Diffuse papular rash on trunk and extremities  Spares palms/soles/face and scalp Itchy  No known exp to insects/no tick bites/new chemicals or foods or medications  Resembles reaction/allergic  tx with prednisone Also zyrtec otc  See avs-adv avoidance of hot conditions and scented products  Keep journal of symptoms and exposures Update if not starting to improve in a week or if worsening

## 2019-11-02 ENCOUNTER — Ambulatory Visit: Payer: BC Managed Care – PPO | Admitting: Dermatology

## 2019-11-02 ENCOUNTER — Other Ambulatory Visit: Payer: Self-pay

## 2019-11-02 DIAGNOSIS — Z86018 Personal history of other benign neoplasm: Secondary | ICD-10-CM

## 2019-11-02 DIAGNOSIS — Z1283 Encounter for screening for malignant neoplasm of skin: Secondary | ICD-10-CM

## 2019-11-02 DIAGNOSIS — D229 Melanocytic nevi, unspecified: Secondary | ICD-10-CM

## 2019-11-02 DIAGNOSIS — L578 Other skin changes due to chronic exposure to nonionizing radiation: Secondary | ICD-10-CM

## 2019-11-02 DIAGNOSIS — L814 Other melanin hyperpigmentation: Secondary | ICD-10-CM

## 2019-11-02 DIAGNOSIS — L821 Other seborrheic keratosis: Secondary | ICD-10-CM

## 2019-11-02 DIAGNOSIS — D1800 Hemangioma unspecified site: Secondary | ICD-10-CM

## 2019-11-02 DIAGNOSIS — L719 Rosacea, unspecified: Secondary | ICD-10-CM

## 2019-11-02 NOTE — Patient Instructions (Signed)
Instructions for [facility] Medications  One or more of your medications was sent to the [facility] mail order compounding pharmacy. You will receive an email from them and can purchase the medicine through that link. It will then be mailed to your home at the address you confirmed. If for any reason you do not receive an email from them, please check your spam folder. If you still do not find the email, please let us know.    

## 2019-11-02 NOTE — Progress Notes (Signed)
   Follow-Up Visit   Subjective  Courtney Pineda is a 49 y.o. female who presents for the following: Annual Exam (History of dysplastc nevi - TBSE today). The patient presents for Total-Body Skin Exam (TBSE) for skin cancer screening and mole check.  The following portions of the chart were reviewed this encounter and updated as appropriate:  Tobacco  Allergies  Meds  Problems  Med Hx  Surg Hx  Fam Hx     Review of Systems:  No other skin or systemic complaints except as noted in HPI or Assessment and Plan.  Objective  Well appearing patient in no apparent distress; mood and affect are within normal limits.  A full examination was performed including scalp, head, eyes, ears, nose, lips, neck, chest, axillae, abdomen, back, buttocks, bilateral upper extremities, bilateral lower extremities, hands, feet, fingers, toes, fingernails, and toenails. All findings within normal limits unless otherwise noted below.  Objective  Left infra axillary, left side: Scar with no evidence of recurrence.    Assessment & Plan    Lentigines - Scattered tan macules - Discussed due to sun exposure - Benign, observe - Call for any changes  Seborrheic Keratoses - Stuck-on, waxy, tan-brown papules and plaques  - Discussed benign etiology and prognosis. - Observe - Call for any changes  Melanocytic Nevi - Tan-brown and/or pink-flesh-colored symmetric macules and papules - Benign appearing on exam today - Observation - Call clinic for new or changing moles - Recommend daily use of broad spectrum spf 30+ sunscreen to sun-exposed areas.   Hemangiomas - Red papules - Discussed benign nature - Observe - Call for any changes  Actinic Damage - diffuse scaly erythematous macules with underlying dyspigmentation - Recommend daily broad spectrum sunscreen SPF 30+ to sun-exposed areas, reapply every 2 hours as needed.  - Call for new or changing lesions.  Skin cancer screening performed  today.  Rosacea Face  May consider Doxycycline if not improving.  Start [facility] metronidazole/ivermectin/azelaic acid twice daily as needed to affected areas on the face. The patient was advised this is not covered by insurance. They will receive an email to check out and the medication will be mailed to their home.    History of dysplastic nevus Left infra axillary, left side  Clear today. Observe   Skin cancer screening  Return in about 1 year (around 11/01/2020).  I, Ashok Cordia, CMA, am acting as scribe for Sarina Ser, MD .  Documentation: I have reviewed the above documentation for accuracy and completeness, and I agree with the above.  Sarina Ser, MD

## 2019-11-05 ENCOUNTER — Encounter: Payer: Self-pay | Admitting: Dermatology

## 2019-11-06 ENCOUNTER — Other Ambulatory Visit: Payer: Self-pay

## 2019-11-06 ENCOUNTER — Encounter: Payer: Self-pay | Admitting: Internal Medicine

## 2019-11-06 ENCOUNTER — Ambulatory Visit: Payer: BC Managed Care – PPO | Admitting: Internal Medicine

## 2019-11-06 VITALS — BP 116/72 | HR 60 | Temp 97.7°F | Wt 160.0 lb

## 2019-11-06 DIAGNOSIS — H02844 Edema of left upper eyelid: Secondary | ICD-10-CM | POA: Diagnosis not present

## 2019-11-06 MED ORDER — ERYTHROMYCIN 5 MG/GM OP OINT: 1.0000 "application " | TOPICAL_OINTMENT | Freq: Every day | OPHTHALMIC | 0 refills | Status: DC

## 2019-11-06 NOTE — Progress Notes (Signed)
Subjective:    Patient ID: Courtney Pineda, female    DOB: [birth year], 49 y.o.   MRN: [id number]  HPI  Pt presents to the clinic today with c/o left eye redness and swelling. She noticed this 2-3 days ago. She has noticed clear discharge from her eye but denies eye pain, visual changes, runny nose, nasal congestion, facial pressure, ear pain, sore throat, loss of taste/smell or cough. She denies fever, chills or body aches. She has not tried anything OTC for this.  Review of Systems      Past Medical History:  Diagnosis Date  . Anemia   . Chronic right upper quadrant pain   . Dysplastic nevus 09/16/2015   left infra axillary  . Dysplastic nevus 10/27/2017   left side  . History of kidney stones   . Hyperemesis gravidarum 1999  . Martel intestinal bacterial overgrowth 02/09/2018    Current Outpatient Medications  Medication Sig Dispense Refill  . acetaminophen (TYLENOL) 325 MG tablet Take 650 mg by mouth every 6 (six) hours as needed.    . benzonatate (TESSALON) 200 MG capsule Take 1 capsule (200 mg total) by mouth 3 (three) times daily as needed for cough. (Patient not taking: Reported on 08/25/2019) 30 capsule 1  . famotidine (PEPCID) 20 MG tablet One in am and after supper 60 tablet 11  . gabapentin (NEURONTIN) 100 MG capsule Take 1 capsule (100 mg total) by mouth 3 (three) times daily. One three times daily 90 capsule 2  . predniSONE (DELTASONE) 10 MG tablet Take 4 pills once daily by mouth for 3 days, then 3 pills daily for 3 days, then 2 pills daily for 3 days then 1 pill daily for 3 days then stop 30 tablet 0  . zinc gluconate 50 MG tablet Take 1 tablet by mouth daily.     No current facility-administered medications for this visit.    Allergies  Allergen Reactions  . Amoxicillin Anaphylaxis  . Bee Venom Anaphylaxis  . Sulfa Antibiotics Anaphylaxis  . Sulfonamide Derivatives Anaphylaxis    Lungs close up  . Penicillins Rash    Has patient had a PCN reaction  causing immediate rash, facial/tongue/throat swelling, SOB or lightheadedness with hypotension: No Has patient had a PCN reaction causing severe rash involving mucus membranes or skin necrosis: No Has patient had a PCN reaction that required hospitalization: No Has patient had a PCN reaction occurring within the last 10 years: No If all of the above answers are "NO", then may proceed with Cephalosporin use.     Family History  Problem Relation Age of Onset  . Migraines Mother   . Breast cancer Paternal Aunt        Contact  . Cancer Maternal Grandmother 40       brain tumor/breast cancer  . Breast cancer Maternal Grandmother   . Heart disease Maternal Grandfather        heart problems  . Colon cancer Neg Hx   . Esophageal cancer Neg Hx   . Liver disease Neg Hx   . Pancreatic cancer Neg Hx   . Stomach cancer Neg Hx   . Inflammatory bowel disease Neg Hx     Social History   Socioeconomic History  . Marital status: Married    Spouse name: Not on file  . Number of children: 2  . Years of education: Not on file  . Highest education level: Not on file  Occupational History  . Occupation: Insurance underwriter  Employer: [employer]  Tobacco Use  . Smoking status: Never Smoker  . Smokeless tobacco: Never Used  Vaping Use  . Vaping Use: Never used  Substance and Sexual Activity  . Alcohol use: No    Alcohol/week: 0.0 standard drinks  . Drug use: No  . Sexual activity: Yes    Birth control/protection: None  Other Topics Concern  . Not on file  Social History Narrative   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, she is an Theatre manager   Rare caffeine and never smoker no alcohol no drug or substance use   Social Determinants of Radio broadcast assistant Strain:   . Difficulty of Paying Living Expenses:   Food Insecurity:   . Worried About Charity fundraiser in the Last Year:   . Arboriculturist in the Last Year:   Transportation Needs:   . Consulting civil engineer (Medical):   Marland Kitchen Lack of Transportation (Non-Medical):   Physical Activity:   . Days of Exercise per Week:   . Minutes of Exercise per Session:   Stress:   . Feeling of Stress :   Social Connections:   . Frequency of Communication with Friends and Family:   . Frequency of Social Gatherings with Friends and Family:   . Attends Religious Services:   . Active Member of Clubs or Organizations:   . Attends Archivist Meetings:   Marland Kitchen Marital Status:   Intimate Partner Violence:   . Fear of Current or Ex-Partner:   . Emotionally Abused:   Marland Kitchen Physically Abused:   . Sexually Abused:      Constitutional: Denies fever, malaise, fatigue, headache or abrupt weight changes.  HEENT: Pt reports left eye redness and eyelid swelling. Denies eye pain, ear pain, ringing in the ears, wax buildup, runny nose, nasal congestion, bloody nose, or sore throat. Respiratory: Denies difficulty breathing, shortness of breath, cough or sputum production.   Cardiovascular: Denies chest pain, chest tightness, palpitations or swelling in the hands or feet.   No other specific complaints in a complete review of systems (except as listed in HPI above).  Objective:   Physical Exam  BP 116/72   Pulse 60   Temp 97.7 F (36.5 C) (Temporal)   Wt 160 lb (72.6 kg)   LMP 01/08/2017   SpO2 98%   BMI 27.46 kg/m   Wt Readings from Last 3 Encounters:  08/25/19 162 lb (73.5 kg)  07/26/19 164 lb (74.4 kg)  07/20/19 163 lb 4 oz (74 kg)    General: Appears her stated age, well developed, well nourished in NAD. Skin: Warm, dry and intact. No rashes noted. HEENT: Head: normal shape and size; Left Eye: sclera white, no icterus, conjunctiva erythematous, PERRLA and EOMs intact, left upper eyelid swelling, no discrete blepharitis or stye noted;  Cardiovascular: Normal rate. Pulmonary/Chest: Normal effort. Neurological: Alert and oriented.    BMET    Component Value Date/Time   NA 139  06/05/2019 0844   K 3.9 06/05/2019 0844   CL 105 06/05/2019 0844   CO2 25 06/05/2019 0844   GLUCOSE 94 06/05/2019 0844   BUN 9 06/05/2019 0844   CREATININE 0.83 06/05/2019 0844   CALCIUM 9.6 06/05/2019 0844   GFRNONAA >60 01/15/2017 0424   GFRAA >60 01/15/2017 0424    Lipid Panel     Component Value Date/Time   CHOL 172 06/05/2019 0844   TRIG 120.0 06/05/2019 0844   HDL  41.00 06/05/2019 0844   CHOLHDL 4 06/05/2019 0844   VLDL 24.0 06/05/2019 0844   LDLCALC 107 (H) 06/05/2019 0844    CBC    Component Value Date/Time   WBC 8.0 06/05/2019 0844   RBC 4.75 06/05/2019 0844   HGB 14.6 06/05/2019 0844   HGB 16.1 (H) 11/17/2018 1526   HCT 43.8 06/05/2019 0844   HCT 46.9 (H) 11/17/2018 1526   PLT 264.0 06/05/2019 0844   PLT 281 11/17/2018 1526   MCV 92.2 06/05/2019 0844   MCV 90 11/17/2018 1526   MCH 30.7 11/17/2018 1526   MCH 21.7 (L) 01/15/2017 0424   MCHC 33.3 06/05/2019 0844   RDW 13.2 06/05/2019 0844   RDW 11.7 11/17/2018 1526   LYMPHSABS 1.8 06/05/2019 0844   MONOABS 0.5 06/05/2019 0844   EOSABS 0.1 06/05/2019 0844   BASOSABS 0.0 06/05/2019 0844    Hgb A1C Lab Results  Component Value Date   HGBA1C 5.1 06/05/2019            Assessment & Plan:   Swelling of Left Upper Eyelid:  Concerning for start of a stye Encouraged warm compresses TID RX for Erythromycin Ointment 1 application at bedtime  Return precautions discussed Webb Silversmith, NP This visit occurred during the SARS-CoV-2 public health emergency.  Safety protocols were in place, including screening questions prior to the visit, additional usage of staff PPE, and extensive cleaning of exam room while observing appropriate contact time as indicated for disinfecting solutions.

## 2019-11-06 NOTE — Patient Instructions (Signed)

## 2019-11-07 ENCOUNTER — Ambulatory Visit: Payer: BC Managed Care – PPO | Admitting: Family Medicine

## 2019-11-09 ENCOUNTER — Telehealth: Payer: Self-pay

## 2019-11-09 NOTE — Telephone Encounter (Signed)
Pt is asking if it is safe for her to take a probiotic with the zinc. Please advise [phone]. Pt aware it may be tomorrow.

## 2019-11-09 NOTE — Telephone Encounter (Signed)
I don't think it would be a problem.

## 2019-11-09 NOTE — Telephone Encounter (Signed)
Pt.notified

## 2019-12-25 ENCOUNTER — Telehealth (INDEPENDENT_AMBULATORY_CARE_PROVIDER_SITE_OTHER): Payer: BC Managed Care – PPO | Admitting: Family Medicine

## 2019-12-25 ENCOUNTER — Encounter: Payer: Self-pay | Admitting: Family Medicine

## 2019-12-25 VITALS — BP 108/67 | HR 64 | Wt 160.5 lb

## 2019-12-25 DIAGNOSIS — J301 Allergic rhinitis due to pollen: Secondary | ICD-10-CM

## 2019-12-25 DIAGNOSIS — J309 Allergic rhinitis, unspecified: Secondary | ICD-10-CM | POA: Insufficient documentation

## 2019-12-25 DIAGNOSIS — R05 Cough: Secondary | ICD-10-CM | POA: Diagnosis not present

## 2019-12-25 DIAGNOSIS — R053 Chronic cough: Secondary | ICD-10-CM

## 2019-12-25 MED ORDER — BENZONATATE 200 MG PO CAPS: 200.0000 mg | ORAL_CAPSULE | Freq: Three times a day (TID) | ORAL | 3 refills | Status: DC | PRN

## 2019-12-25 MED ORDER — CETIRIZINE HCL 10 MG PO TABS: 10.0000 mg | ORAL_TABLET | Freq: Every day | ORAL | 11 refills | Status: DC

## 2019-12-25 MED ORDER — MONTELUKAST SODIUM 10 MG PO TABS: 10.0000 mg | ORAL_TABLET | Freq: Every day | ORAL | 11 refills | Status: DC

## 2019-12-25 NOTE — Patient Instructions (Signed)
Try the zyrtec and singulair as directed for allergy congestion and post nasal drip  Hopefully this will lessen cough  Also refilled tessalon  Watch for symptoms of acid reflux  Update if not starting to improve in a week or if worsening

## 2019-12-25 NOTE — Assessment & Plan Note (Signed)
Seasonal-worse in spring and fall Suspect this has re started/worsened chronic cough Trial of zyrtec 10 mg at bedtime Also singulaire (update if side eff or mood change)  inst to call if worse or no improvement or any new symptoms

## 2019-12-25 NOTE — Progress Notes (Signed)
Virtual Visit via Video Note  I connected with Courtney Pineda on 12/25/19 at  3:30 PM EDT by a video enabled telemedicine application and verified that I am speaking with the correct person using two identifiers.  Location: Patient: home Provider: office   I discussed the limitations of evaluation and management by telemedicine and the availability of in person appointments. The patient expressed understanding and agreed to proceed.  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 -seen at [facility]  Nothing seems to help it  Has not followed up on that   Not a prod cough  Drip is clear  No fever -watches for that   occ gets a tight chest (perhaps more when exercising)   Frustrated by it  (with a busy schedule)  Got better and then worse (now over the summer)  Tried some tessalon  Wt Readings from Last 3 Encounters:  12/25/19 160 lb 8 oz (72.8 kg)  11/06/19 160 lb (72.6 kg)  08/25/19 162 lb (73.5 kg)   27.55 kg/m    Mood-hx of stress reaction with anxiety  Never did start counseling (too busy) Summer leveled out stress wise with daughter   Understaffed at work  Trying to take care of herself (exercise classes)   Immunized for covid- [employer]   Allergies-seasonal  pnd is always clear  Some sneezing  Not taking antihistamine   Patient Active Problem List   Diagnosis Date Noted  . Allergic rhinitis 12/25/2019  . Rash and nonspecific skin eruption 08/25/2019  . Vaccine reaction 07/26/2019  . Perimenopause 06/08/2018  . Low HDL (under 40) 06/08/2018  . [place] intestinal bacterial overgrowth 02/09/2018  .  Abdominal bloating   . Cyst of right kidney 06/04/2017  . RUQ abdominal pain 05/31/2017  . Elevated glucose level 05/22/2017  . S/P laparoscopic hysterectomy 01/14/2017  . Abdominal pain 07/27/2016  . Daily headache 06/01/2016  . Chronic cough 06/01/2016  . Encounter for routine gynecological examination 05/23/2014  . Burning sensation of mouth 09/12/2013  . Routine general medical examination at a health care facility 01/05/2011  . KIDNEY STONE 03/27/2010  . Atypical chest pain 07/18/2008  . STRESS REACTION, ACUTE, WITH EMOTIONAL DISTURBANCE 09/13/2007  . HEADACHE 09/13/2007   Past Medical History:  Diagnosis Date  . Anemia   . Chronic right upper quadrant pain   . Dysplastic nevus 09/16/2015   left infra axillary  . Dysplastic nevus 10/27/2017   left side  . History of kidney stones   . Hyperemesis gravidarum 1999  . [place] intestinal bacterial overgrowth 02/09/2018   Past Surgical History:  Procedure Laterality Date  . CHOLECYSTECTOMY  06/1994  . COLONOSCOPY WITH PROPOFOL N/A 07/12/2017   Procedure: COLONOSCOPY WITH PROPOFOL;  Surgeon: Lin Landsman, MD;  Location: [facility] ENDOSCOPY;  Service: Gastroenterology;  Laterality: N/A;  . CYSTOSCOPY N/A 01/14/2017   Procedure: CYSTOSCOPY;  Surgeon: Malachy Mood, MD;  Location: [facility] ORS;  Service: Gynecology;  Laterality: N/A;  . ESOPHAGOGASTRODUODENOSCOPY (EGD) WITH PROPOFOL N/A 07/12/2017   Procedure: ESOPHAGOGASTRODUODENOSCOPY (EGD) WITH PROPOFOL;  Surgeon: Lin Landsman, MD;  Location: [place];  Service: Gastroenterology;  Laterality: N/A;  . KIDNEY STONE SURGERY    . LITHOTRIPSY  03/2006  . TONSILECTOMY, ADENOIDECTOMY, BILATERAL MYRINGOTOMY AND TUBES     myringotomy tubes  . TOTAL LAPAROSCOPIC HYSTERECTOMY WITH SALPINGECTOMY Bilateral 01/14/2017   Procedure: HYSTERECTOMY TOTAL LAPAROSCOPIC WITH SALPINGECTOMY;  Surgeon: Malachy Mood, MD;  Location: [facility] ORS;  Service: Gynecology;  Laterality: Bilateral;    Social History   Tobacco Use  . Smoking status: Never Smoker  . Smokeless tobacco: Never Used  Vaping Use  . Vaping Use: Never used  Substance Use Topics  . Alcohol use: No    Alcohol/week: 0.0 standard drinks  . Drug use: No   Family History  Problem Relation Age of Onset  . Migraines Mother   . Breast cancer Paternal Aunt        Contact  . Cancer Maternal Grandmother 49       brain tumor/breast cancer  . Breast cancer Maternal Grandmother   . Heart disease Maternal Grandfather        heart problems  . Colon cancer Neg Hx   . Esophageal cancer Neg Hx   . Liver disease Neg Hx   . Pancreatic cancer Neg Hx   . Stomach cancer Neg Hx   . Inflammatory bowel disease Neg Hx    Allergies  Allergen Reactions  . Amoxicillin Anaphylaxis  . Bee Venom Anaphylaxis  . Sulfa Antibiotics Anaphylaxis  . Sulfonamide Derivatives Anaphylaxis    Lungs close up  . Penicillins Rash    Has patient had a PCN reaction causing immediate rash, facial/tongue/throat swelling, SOB or lightheadedness with hypotension: No Has patient had a PCN reaction causing severe rash involving mucus membranes or skin necrosis: No Has patient had a PCN reaction that required hospitalization: No Has patient had a PCN reaction occurring within the last 10 years: No If all of the above answers are "NO", then may proceed with Cephalosporin use.    Current Outpatient Medications on File Prior to Visit  Medication Sig Dispense Refill  . acetaminophen (TYLENOL) 325 MG tablet Take 650 mg by mouth every 6 (six) hours as needed.    . zinc gluconate 50 MG tablet Take 1 tablet by mouth daily.     No current facility-administered medications on file prior to visit.   Review of Systems  Constitutional: Negative for chills, fever and malaise/fatigue.  HENT: Negative for congestion, ear pain, sinus pain and sore throat.        Rhinorrhea and pnd Throat clearing   Eyes: Negative for blurred vision, discharge and redness.   Respiratory: Positive for cough. Negative for hemoptysis, sputum production, shortness of breath and stridor.   Cardiovascular: Negative for chest pain, palpitations and leg swelling.  Gastrointestinal: Negative for abdominal pain, diarrhea, heartburn, nausea and vomiting.       Chronic abd pain is the same  Musculoskeletal: Negative for myalgias.  Skin: Negative for rash.  Neurological: Negative for dizziness and headaches.  Psychiatric/Behavioral: The patient is nervous/anxious.        Stressors     Observations/Objective: Patient appears well, in no distress Weight is baseline  No facial swelling or asymmetry Some sniffling /no sneezing during interview Normal voice-not hoarse and no slurred speech No obvious tremor or mobility impairment Moving neck and UEs normally Able to hear the call well  occ dry cough during interview , no wheeze or sob  Talkative and mentally sharp with no cognitive changes No skin changes on face or neck , no  rash or pallor Affect is normal (cheerful today and less anx)   Assessment and Plan: Problem List Items Addressed This Visit      Respiratory   Allergic rhinitis    Seasonal-worse in spring and fall Suspect this has re started/worsened chronic cough Trial of zyrtec 10 mg at bedtime Also singulaire (update if side eff or mood change)  inst to call if worse or no improvement or any new symptoms          Other   Chronic cough - Primary    Worse again-per pt due to allergies and pnd  She stopped pepcid=did not see much of a difference Refilled tessalon Px zyrtec and singulaire to try and update  Allergen avoidance discussed          Follow Up Instructions: Try the zyrtec and singulair as directed for allergy congestion and post nasal drip  Hopefully this will lessen cough  Also refilled tessalon  Watch for symptoms of acid reflux  Update if not starting to improve in a week or if worsening     I discussed the assessment and  treatment plan with the patient. The patient was provided an opportunity to ask questions and all were answered. The patient agreed with the plan and demonstrated an understanding of the instructions.   The patient was advised to call back or seek an in-person evaluation if the symptoms worsen or if the condition fails to improve as anticipated.    Loura Pardon, MD

## 2019-12-25 NOTE — Assessment & Plan Note (Signed)
Worse again-per pt due to allergies and pnd  She stopped pepcid=did not see much of a difference Refilled tessalon Px zyrtec and singulaire to try and update  Allergen avoidance discussed

## 2020-01-03 ENCOUNTER — Ambulatory Visit: Payer: BC Managed Care – PPO

## 2020-01-17 ENCOUNTER — Ambulatory Visit (INDEPENDENT_AMBULATORY_CARE_PROVIDER_SITE_OTHER): Payer: BC Managed Care – PPO

## 2020-01-17 DIAGNOSIS — Z23 Encounter for immunization: Secondary | ICD-10-CM

## 2020-02-07 ENCOUNTER — Telehealth: Payer: Self-pay | Admitting: Family Medicine

## 2020-02-07 NOTE — Telephone Encounter (Signed)
Pt called stating her daughter was listed as her guarantor. At the time, I was unable to access her chart due to someone else accessing it. I was able to fix the guarantor account. I attempted to reach patient to let her know this has been fixed. No answer, left voicemail.

## 2020-02-26 ENCOUNTER — Encounter: Payer: Self-pay | Admitting: Family Medicine

## 2020-02-27 ENCOUNTER — Other Ambulatory Visit: Payer: Self-pay

## 2020-02-27 ENCOUNTER — Ambulatory Visit: Payer: BC Managed Care – PPO | Admitting: Family Medicine

## 2020-02-27 ENCOUNTER — Encounter: Payer: Self-pay | Admitting: Family Medicine

## 2020-02-27 VITALS — BP 114/68 | HR 64 | Temp 97.1°F | Ht 64.0 in | Wt 159.2 lb

## 2020-02-27 DIAGNOSIS — R4589 Other symptoms and signs involving emotional state: Secondary | ICD-10-CM

## 2020-02-27 MED ORDER — BUSPIRONE HCL 15 MG PO TABS: 7.5000 mg | ORAL_TABLET | Freq: Two times a day (BID) | ORAL | 5 refills | Status: DC

## 2020-02-27 NOTE — Assessment & Plan Note (Signed)
Stress reaction with worsening anxiety  Disc imp of counseling-did another ref and pt promises to touch base  Reviewed stressors/ coping techniques/symptoms/ support sources/ tx options and side effects in detail today Enc journaling and talking to friends  Recommended the meditation and mindfulness apps Calm and Headspace to try this practice Enc her to fit in exercise and self care when able Trial of buspar 7.5 mg bid Discussed expectations of this medication including time to effectiveness and mechanism of action, also poss of side effects (early and late)- including mental fuzziness, weight or appetite change, nausea and poss of worse dep or anxiety (even suicidal thoughts)  Pt voiced understanding and will stop med and update if this occurs  Will plan f/u in about 6 weeks

## 2020-02-27 NOTE — Progress Notes (Signed)
Subjective:    Patient ID: Courtney Pineda, female    DOB: 1970-05-19, 49 y.o.   MRN: [id number]  This visit occurred during the SARS-CoV-2 public health emergency.  Safety protocols were in place, including screening questions prior to the visit, additional usage of staff PPE, and extensive cleaning of exam room while observing appropriate contact time as indicated for disinfecting solutions.    HPI Pt presents to discuss stress reaction   Wt Readings from Last 3 Encounters:  02/27/20 159 lb 3 oz (72.2 kg)  12/25/19 160 lb 8 oz (72.8 kg)  11/06/19 160 lb (72.6 kg)   27.32 kg/m  Having trouble concentrating  Not as focused  Forgetting things  Feels like she is missing things   occ dec appetite  occ does not enjoy things   Biggest worry right now is a missed check - worried she may get fired over it   More agitated / irritable  Hard to be in the moment - scattered brain  Takes it out on her son   Does not sleep well   Financial stress at home  Family is doing ok -everyone healthy /no scares  Getting along with each other better than in the past   Signed up with a fitness program through work-will be working with a trainer via zoom  No time to take care of herself  Just got a weight kit  Working at home makes it tricky   She never connected to a counselor -played phone tag   PHQ of 7 GAD 7 score of 6    Patient Active Problem List   Diagnosis Date Noted  . Allergic rhinitis 12/25/2019  . Rash and nonspecific skin eruption 08/25/2019  . Vaccine reaction 07/26/2019  . Perimenopause 06/08/2018  . Low HDL (under 40) 06/08/2018  . [place] intestinal bacterial overgrowth 02/09/2018  . Abdominal bloating   . Cyst of right kidney 06/04/2017  . RUQ abdominal pain 05/31/2017  . Elevated glucose level 05/22/2017  . S/P laparoscopic hysterectomy 01/14/2017  . Abdominal pain 07/27/2016  . Daily headache 06/01/2016  . Chronic cough 06/01/2016  . Encounter for  routine gynecological examination 05/23/2014  . Burning sensation of mouth 09/12/2013  . Routine general medical examination at a health care facility 01/05/2011  . KIDNEY STONE 03/27/2010  . Atypical chest pain 07/18/2008  . STRESS REACTION, ACUTE, WITH EMOTIONAL DISTURBANCE 09/13/2007  . HEADACHE 09/13/2007   Past Medical History:  Diagnosis Date  . Anemia   . Chronic right upper quadrant pain   . Dysplastic nevus 09/16/2015   left infra axillary  . Dysplastic nevus 10/27/2017   left side  . History of kidney stones   . Hyperemesis gravidarum 1999  . [place] intestinal bacterial overgrowth 02/09/2018   Past Surgical History:  Procedure Laterality Date  . CHOLECYSTECTOMY  06/1994  . COLONOSCOPY WITH PROPOFOL N/A 07/12/2017   Procedure: COLONOSCOPY WITH PROPOFOL;  Surgeon: Lin Landsman, MD;  Location: [facility] ENDOSCOPY;  Service: Gastroenterology;  Laterality: N/A;  . CYSTOSCOPY N/A 01/14/2017   Procedure: CYSTOSCOPY;  Surgeon: Malachy Mood, MD;  Location: [facility] ORS;  Service: Gynecology;  Laterality: N/A;  . ESOPHAGOGASTRODUODENOSCOPY (EGD) WITH PROPOFOL N/A 07/12/2017   Procedure: ESOPHAGOGASTRODUODENOSCOPY (EGD) WITH PROPOFOL;  Surgeon: Lin Landsman, MD;  Location: [facility] ENDOSCOPY;  Service: Gastroenterology;  Laterality: N/A;  . KIDNEY STONE SURGERY    . LITHOTRIPSY  03/2006  . TONSILECTOMY, ADENOIDECTOMY, BILATERAL MYRINGOTOMY AND TUBES     myringotomy tubes  .  TOTAL LAPAROSCOPIC HYSTERECTOMY WITH SALPINGECTOMY Bilateral 01/14/2017   Procedure: HYSTERECTOMY TOTAL LAPAROSCOPIC WITH SALPINGECTOMY;  Surgeon: Malachy Mood, MD;  Location: [facility] ORS;  Service: Gynecology;  Laterality: Bilateral;   Social History   Tobacco Use  . Smoking status: Never Smoker  . Smokeless tobacco: Never Used  Vaping Use  . Vaping Use: Never used  Substance Use Topics  . Alcohol use: No    Alcohol/week: 0.0 standard drinks  . Drug use: No   Family History  Problem Relation Age  of Onset  . Migraines Mother   . Breast cancer Paternal Aunt        Contact  . Cancer Maternal Grandmother 46       brain tumor/breast cancer  . Breast cancer Maternal Grandmother   . Heart disease Maternal Grandfather        heart problems  . Colon cancer Neg Hx   . Esophageal cancer Neg Hx   . Liver disease Neg Hx   . Pancreatic cancer Neg Hx   . Stomach cancer Neg Hx   . Inflammatory bowel disease Neg Hx    Allergies  Allergen Reactions  . Amoxicillin Anaphylaxis  . Bee Venom Anaphylaxis  . Sulfa Antibiotics Anaphylaxis  . Sulfonamide Derivatives Anaphylaxis    Lungs close up  . Penicillins Rash    Has patient had a PCN reaction causing immediate rash, facial/tongue/throat swelling, SOB or lightheadedness with hypotension: No Has patient had a PCN reaction causing severe rash involving mucus membranes or skin necrosis: No Has patient had a PCN reaction that required hospitalization: No Has patient had a PCN reaction occurring within the last 10 years: No If all of the above answers are "NO", then may proceed with Cephalosporin use.    Current Outpatient Medications on File Prior to Visit  Medication Sig Dispense Refill  . acetaminophen (TYLENOL) 325 MG tablet Take 650 mg by mouth every 6 (six) hours as needed.    . benzonatate (TESSALON) 200 MG capsule Take 1 capsule (200 mg total) by mouth 3 (three) times daily as needed for cough. 30 capsule 3  . cetirizine (ZYRTEC) 10 MG tablet Take 1 tablet (10 mg total) by mouth daily. In evening 30 tablet 11  . montelukast (SINGULAIR) 10 MG tablet Take 1 tablet (10 mg total) by mouth at bedtime. 30 tablet 11  . zinc gluconate 50 MG tablet Take 1 tablet by mouth daily.     No current facility-administered medications on file prior to visit.    Review of Systems  Constitutional: Positive for fatigue. Negative for activity change, appetite change, fever and unexpected weight change.  HENT: Negative for congestion, ear pain,  rhinorrhea, sinus pressure and sore throat.   Eyes: Negative for pain, redness and visual disturbance.  Respiratory: Negative for cough, shortness of breath and wheezing.   Cardiovascular: Negative for chest pain and palpitations.  Gastrointestinal: Negative for abdominal pain, blood in stool, constipation and diarrhea.  Endocrine: Negative for polydipsia and polyuria.  Genitourinary: Negative for dysuria, frequency and urgency.  Musculoskeletal: Negative for arthralgias, back pain and myalgias.  Skin: Negative for pallor and rash.  Allergic/Immunologic: Negative for environmental allergies.  Neurological: Negative for dizziness, syncope and headaches.  Hematological: Negative for adenopathy. Does not bruise/bleed easily.  Psychiatric/Behavioral: Positive for dysphoric mood and sleep disturbance. Negative for decreased concentration and suicidal ideas. The patient is nervous/anxious.        Objective:   Physical Exam Constitutional:      General: She is not  in acute distress.    Appearance: Normal appearance. She is normal weight. She is not ill-appearing.  Eyes:     General: No scleral icterus.    Conjunctiva/sclera: Conjunctivae normal.     Pupils: Pupils are equal, round, and reactive to light.  Neck:     Vascular: No carotid bruit.  Cardiovascular:     Rate and Rhythm: Normal rate and regular rhythm.     Heart sounds: Normal heart sounds.  Pulmonary:     Effort: Pulmonary effort is normal. No respiratory distress.     Breath sounds: Normal breath sounds.  Musculoskeletal:     Cervical back: Normal range of motion and neck supple.  Lymphadenopathy:     Cervical: No cervical adenopathy.  Skin:    Coloration: Skin is not jaundiced or pale.     Findings: No rash.  Neurological:     Mental Status: She is alert.     Sensory: No sensory deficit.     Motor: No tremor.     Coordination: Coordination normal.     Deep Tendon Reflexes: Reflexes normal.  Psychiatric:        Mood  and Affect: Mood is anxious and depressed. Affect is tearful.        Speech: Speech normal.        Behavior: Behavior normal.        Thought Content: Thought content does not include suicidal ideation.        Cognition and Memory: Cognition and memory normal.     Comments: Seems more anxious than depressed Somewhat tearful  Candidly discusses her stressors  Good insight            Assessment & Plan:   Problem List Items Addressed This Visit      Other   STRESS REACTION, ACUTE, WITH EMOTIONAL DISTURBANCE - Primary    Stress reaction with worsening anxiety  Disc imp of counseling-did another ref and pt promises to touch base  Reviewed stressors/ coping techniques/symptoms/ support sources/ tx options and side effects in detail today Enc journaling and talking to friends  Recommended the meditation and mindfulness apps Calm and Headspace to try this practice Enc her to fit in exercise and self care when able Trial of buspar 7.5 mg bid Discussed expectations of this medication including time to effectiveness and mechanism of action, also poss of side effects (early and late)- including mental fuzziness, weight or appetite change, nausea and poss of worse dep or anxiety (even suicidal thoughts)  Pt voiced understanding and will stop med and update if this occurs  Will plan f/u in about 6 weeks       Relevant Orders   Ambulatory referral to Psychology

## 2020-02-27 NOTE — Patient Instructions (Addendum)
Check out the apps  Calm  Headspace   This is to help concentration via meditation and sleep   I put in a referral for counseling  You will get a call    Start buspar 1/2 pill twice daily  If any intolerable side effect stop it and call  If you feel worse- stop it and call   Work in exercise and time for self care   Follow up in 6 weeks

## 2020-03-20 ENCOUNTER — Other Ambulatory Visit: Payer: Self-pay | Admitting: Family Medicine

## 2020-04-10 ENCOUNTER — Ambulatory Visit: Payer: BC Managed Care – PPO | Admitting: Family Medicine

## 2020-04-23 ENCOUNTER — Ambulatory Visit: Payer: BC Managed Care – PPO | Admitting: Family Medicine

## 2020-04-23 ENCOUNTER — Telehealth: Payer: Self-pay | Admitting: Family Medicine

## 2020-04-23 ENCOUNTER — Other Ambulatory Visit: Payer: Self-pay | Admitting: Family Medicine

## 2020-04-23 NOTE — Telephone Encounter (Signed)
Patient called in stating she is needing to have order placed for her mammogram. Please advise.

## 2020-04-23 NOTE — Telephone Encounter (Signed)
It looks like she had a screening mammogram in 3/21  Too early to order  Also if screening she should not need an order from [place] She goes to [facility]

## 2020-04-23 NOTE — Telephone Encounter (Signed)
Pt notified of Dr. Marliss Coots comments. She will call and schedule appt

## 2020-04-26 ENCOUNTER — Other Ambulatory Visit: Payer: Self-pay | Admitting: Family Medicine

## 2020-04-26 DIAGNOSIS — Z1231 Encounter for screening mammogram for malignant neoplasm of breast: Secondary | ICD-10-CM

## 2020-06-02 ENCOUNTER — Telehealth: Payer: Self-pay | Admitting: Family Medicine

## 2020-06-02 DIAGNOSIS — R7309 Other abnormal glucose: Secondary | ICD-10-CM

## 2020-06-02 DIAGNOSIS — Z0000 Encounter for general adult medical examination without abnormal findings: Secondary | ICD-10-CM

## 2020-06-02 DIAGNOSIS — E786 Lipoprotein deficiency: Secondary | ICD-10-CM

## 2020-06-02 NOTE — Telephone Encounter (Signed)
-----   Message from Cloyd Stagers, RT sent at 05/20/2020  1:28 PM EST ----- Regarding: Lab Orders for Monday 2.21.2022 Please place lab orders for Monday 2.21.2022, office visit for physical on Friday 3.4.2022 Thank you, Dyke Maes RT(R)

## 2020-06-03 ENCOUNTER — Other Ambulatory Visit: Payer: Self-pay

## 2020-06-03 ENCOUNTER — Other Ambulatory Visit (INDEPENDENT_AMBULATORY_CARE_PROVIDER_SITE_OTHER): Payer: BC Managed Care – PPO

## 2020-06-03 DIAGNOSIS — R7309 Other abnormal glucose: Secondary | ICD-10-CM | POA: Diagnosis not present

## 2020-06-03 DIAGNOSIS — E786 Lipoprotein deficiency: Secondary | ICD-10-CM

## 2020-06-03 DIAGNOSIS — Z0000 Encounter for general adult medical examination without abnormal findings: Secondary | ICD-10-CM

## 2020-06-03 LAB — COMPREHENSIVE METABOLIC PANEL
ALT: 37 U/L — ABNORMAL HIGH (ref 0–35)
AST: 39 U/L — ABNORMAL HIGH (ref 0–37)
Albumin: 4.2 g/dL (ref 3.5–5.2)
Alkaline Phosphatase: 111 U/L (ref 39–117)
BUN: 12 mg/dL (ref 6–23)
CO2: 29 mEq/L (ref 19–32)
Calcium: 9.8 mg/dL (ref 8.4–10.5)
Chloride: 104 mEq/L (ref 96–112)
Creatinine, Ser: 0.89 mg/dL (ref 0.40–1.20)
GFR: 77.34 mL/min (ref >60.00)
Glucose, Bld: 79 mg/dL (ref 70–99)
Potassium: 4.5 mEq/L (ref 3.5–5.1)
Sodium: 141 mEq/L (ref 135–145)
Total Bilirubin: 0.8 mg/dL (ref 0.2–1.2)
Total Protein: 6.9 g/dL (ref 6.0–8.3)

## 2020-06-03 LAB — CBC WITH DIFFERENTIAL/PLATELET
Basophils Absolute: 0 10*3/uL (ref 0.0–0.1)
Basophils Relative: 0.7 % (ref 0.0–3.0)
Eosinophils Absolute: 0.1 10*3/uL (ref 0.0–0.7)
Eosinophils Relative: 1.8 % (ref 0.0–5.0)
HCT: 44.1 % (ref 36.0–46.0)
Hemoglobin: 14.9 g/dL (ref 12.0–15.0)
Lymphocytes Relative: 37.1 % (ref 12.0–46.0)
Lymphs Abs: 2.2 10*3/uL (ref 0.7–4.0)
MCHC: 33.7 g/dL (ref 30.0–36.0)
MCV: 91 fl (ref 78.0–100.0)
Monocytes Absolute: 0.5 10*3/uL (ref 0.1–1.0)
Monocytes Relative: 8.4 % (ref 3.0–12.0)
Neutro Abs: 3 10*3/uL (ref 1.4–7.7)
Neutrophils Relative %: 52 % (ref 43.0–77.0)
Platelets: 276 10*3/uL (ref 150.0–400.0)
RBC: 4.85 Mil/uL (ref 3.87–5.11)
RDW: 13.1 % (ref 11.5–15.5)
WBC: 5.9 10*3/uL (ref 4.0–10.5)

## 2020-06-03 LAB — HEMOGLOBIN A1C: Hgb A1c MFr Bld: 5.1 % (ref 4.6–6.5)

## 2020-06-03 LAB — TSH: TSH: 1.75 u[IU]/mL (ref 0.35–4.50)

## 2020-06-03 LAB — LIPID PANEL
Cholesterol: 162 mg/dL (ref 0–200)
HDL: 35.8 mg/dL — ABNORMAL LOW (ref >39.00)
LDL Cholesterol: 101 mg/dL — ABNORMAL HIGH (ref 0–99)
NonHDL: 125.75
Total CHOL/HDL Ratio: 5
Triglycerides: 126 mg/dL (ref 0.0–149.0)
VLDL: 25.2 mg/dL (ref 0.0–40.0)

## 2020-06-14 ENCOUNTER — Encounter: Payer: Self-pay | Admitting: Family Medicine

## 2020-06-14 ENCOUNTER — Ambulatory Visit (INDEPENDENT_AMBULATORY_CARE_PROVIDER_SITE_OTHER): Payer: BC Managed Care – PPO | Admitting: Family Medicine

## 2020-06-14 ENCOUNTER — Other Ambulatory Visit: Payer: Self-pay

## 2020-06-14 VITALS — BP 110/70 | HR 71 | Temp 96.7°F | Ht 64.0 in | Wt 159.2 lb

## 2020-06-14 DIAGNOSIS — J301 Allergic rhinitis due to pollen: Secondary | ICD-10-CM

## 2020-06-14 DIAGNOSIS — R7309 Other abnormal glucose: Secondary | ICD-10-CM

## 2020-06-14 DIAGNOSIS — Z0000 Encounter for general adult medical examination without abnormal findings: Secondary | ICD-10-CM

## 2020-06-14 DIAGNOSIS — E786 Lipoprotein deficiency: Secondary | ICD-10-CM

## 2020-06-14 DIAGNOSIS — R053 Chronic cough: Secondary | ICD-10-CM

## 2020-06-14 DIAGNOSIS — R7401 Elevation of levels of liver transaminase levels: Secondary | ICD-10-CM

## 2020-06-14 NOTE — Assessment & Plan Note (Signed)
Interested in ref to pulmonary  Disturbing sleep Treating allergies and GERD

## 2020-06-14 NOTE — Assessment & Plan Note (Signed)
Mild and has happened in the past  No tylenol or alcohol May be fatty liver  H/o of obesity  Will follow  In wt loss program now /eating low fat and I suspect it will help

## 2020-06-14 NOTE — Assessment & Plan Note (Signed)
Disc goals for lipids and reasons to control them Rev last labs with pt Rev low sat fat diet in detail Will eat more fish (or try fish oil) for the low HDL Eating better now

## 2020-06-14 NOTE — Assessment & Plan Note (Signed)
Reviewed health habits including diet and exercise and skin cancer prevention Reviewed appropriate screening tests for age  Also reviewed health mt list, fam hx and immunization status , as well as social and family history   See HPI Labs reviewed  Enc to proceed with new health program  Encouraged a covid booster  Mammogram planned  If her insurance covers a colonoscopy she will call for a referral  Continues gyn f/u with Dr Julaine Fusi

## 2020-06-14 NOTE — Assessment & Plan Note (Signed)
Lab Results  Component Value Date   HGBA1C 5.1 06/03/2020   Reassuring disc imp of low glycemic diet and wt loss to prevent DM2

## 2020-06-14 NOTE — Assessment & Plan Note (Signed)
More rhinorrhea that worsens chronic cough  Suggested zyrtec 10mg  at bedtime and flonase or nasacort ns daily through the season

## 2020-06-14 NOTE — Progress Notes (Signed)
Subjective:    Patient ID: Courtney Pineda, female    DOB: 06-Mar-1971, 50 y.o.   MRN: [id number]  This visit occurred during the SARS-CoV-2 public health emergency.  Safety protocols were in place, including screening questions prior to the visit, additional usage of staff PPE, and extensive cleaning of exam room while observing appropriate contact time as indicated for disinfecting solutions.    HPI  Here for health maintenance exam and to review chronic medical problems    Wt Readings from Last 3 Encounters:  06/14/20 159 lb 3 oz (72.2 kg)  02/27/20 159 lb 3 oz (72.2 kg)  12/25/19 160 lb 8 oz (72.8 kg)   27.32 kg/m   Just lost an aunt and cousin recently - tough time   Has been doing well other than that  Stressful work - going back into the office next week (part home and part in office)   She started a better health program  6 week challenge and sticking to a healthy meal plan  Down 6 lb for the first week  Needs more veggies and occ skips a meal  3 classes at the gym and does some weigh ins  Has some exercise equip at home also  More water (some flavored) and occ protein shakes  Working on self care!   Flu shot 10/21 covid shot J and J- afraid to get a covid booster  Tdap 12/14  Mammogram 3/21 Has one scheduled this month Self breast exam -no lumps    Sees gyn -Dr Julaine Fusi  Vaginal itching/irritation and dysparunia  appt is upcoming   Colonoscopy 4/19   Pap 8/18  Had a hysterectomy   BP Readings from Last 3 Encounters:  06/14/20 110/70  02/27/20 114/68  12/25/19 108/67   Pulse Readings from Last 3 Encounters:  06/14/20 71  02/27/20 64  12/25/19 64   H/o elevated glucose Lab Results  Component Value Date   HGBA1C 5.1 06/03/2020    Lipids Lab Results  Component Value Date   CHOL 162 06/03/2020   CHOL 172 06/05/2019   CHOL 150 05/30/2018   Lab Results  Component Value Date   HDL 35.80 (L) 06/03/2020   HDL 41.00 06/05/2019   HDL  32.60 (L) 05/30/2018   Lab Results  Component Value Date   LDLCALC 101 (H) 06/03/2020   LDLCALC 107 (H) 06/05/2019   LDLCALC 99 05/30/2018   Lab Results  Component Value Date   TRIG 126.0 06/03/2020   TRIG 120.0 06/05/2019   TRIG 94.0 05/30/2018   Lab Results  Component Value Date   CHOLHDL 5 06/03/2020   CHOLHDL 4 06/05/2019   CHOLHDL 5 05/30/2018   No results found for: LDLDIRECT Low HDL  Plans on eating more fish Is exercising    Mood is improved Taking buspiron 7.5 mg bid -but not taking regularly   Has a some rhinorrhea/ allergies and some cough  Cough is chronic  Considering pulmonary   LFTs Lab Results  Component Value Date   ALT 37 (H) 06/03/2020   AST 39 (H) 06/03/2020   ALKPHOS 111 06/03/2020   BILITOT 0.8 06/03/2020   Has had some discomfort in R abdomen  Feels a knot there  No particular time   No tylenol or etoh   gb was removed   Lab Results  Component Value Date   WBC 5.9 06/03/2020   HGB 14.9 06/03/2020   HCT 44.1 06/03/2020   MCV 91.0 06/03/2020   PLT 276.0 06/03/2020  Lab Results  Component Value Date   CREATININE 0.89 06/03/2020   BUN 12 06/03/2020   NA 141 06/03/2020   K 4.5 06/03/2020   CL 104 06/03/2020   CO2 29 06/03/2020    Lab Results  Component Value Date   TSH 1.75 06/03/2020    Patient Active Problem List   Diagnosis Date Noted  . Allergic rhinitis 12/25/2019  . Vaccine reaction 07/26/2019  . Perimenopause 06/08/2018  . Low HDL (under 40) 06/08/2018  . [place] intestinal bacterial overgrowth 02/09/2018  . Cyst of right kidney 06/04/2017  . RUQ abdominal pain 05/31/2017  . Elevated glucose level 05/22/2017  . S/P laparoscopic hysterectomy 01/14/2017  . Abdominal pain 07/27/2016  . Daily headache 06/01/2016  . Chronic cough 06/01/2016  . Encounter for routine gynecological examination 05/23/2014  . Burning sensation of mouth 09/12/2013  . Elevated transaminase level 01/13/2011  . Routine general medical  examination at a health care facility 01/05/2011  . KIDNEY STONE 03/27/2010  . Atypical chest pain 07/18/2008  . STRESS REACTION, ACUTE, WITH EMOTIONAL DISTURBANCE 09/13/2007  . HEADACHE 09/13/2007   Past Medical History:  Diagnosis Date  . Anemia   . Chronic right upper quadrant pain   . Dysplastic nevus 09/16/2015   left infra axillary  . Dysplastic nevus 10/27/2017   left side  . History of kidney stones   . Hyperemesis gravidarum 1999  . [place] intestinal bacterial overgrowth 02/09/2018   Past Surgical History:  Procedure Laterality Date  . CHOLECYSTECTOMY  06/1994  . COLONOSCOPY WITH PROPOFOL N/A 07/12/2017   Procedure: COLONOSCOPY WITH PROPOFOL;  Surgeon: Lin Landsman, MD;  Location: [facility] ENDOSCOPY;  Service: Gastroenterology;  Laterality: N/A;  . CYSTOSCOPY N/A 01/14/2017   Procedure: CYSTOSCOPY;  Surgeon: Malachy Mood, MD;  Location: [facility] ORS;  Service: Gynecology;  Laterality: N/A;  . ESOPHAGOGASTRODUODENOSCOPY (EGD) WITH PROPOFOL N/A 07/12/2017   Procedure: ESOPHAGOGASTRODUODENOSCOPY (EGD) WITH PROPOFOL;  Surgeon: Lin Landsman, MD;  Location: [facility] ENDOSCOPY;  Service: Gastroenterology;  Laterality: N/A;  . KIDNEY STONE SURGERY    . LITHOTRIPSY  03/2006  . TONSILECTOMY, ADENOIDECTOMY, BILATERAL MYRINGOTOMY AND TUBES     myringotomy tubes  . TOTAL LAPAROSCOPIC HYSTERECTOMY WITH SALPINGECTOMY Bilateral 01/14/2017   Procedure: HYSTERECTOMY TOTAL LAPAROSCOPIC WITH SALPINGECTOMY;  Surgeon: Malachy Mood, MD;  Location: [facility] ORS;  Service: Gynecology;  Laterality: Bilateral;   Social History   Tobacco Use  . Smoking status: Never Smoker  . Smokeless tobacco: Never Used  Vaping Use  . Vaping Use: Never used  Substance Use Topics  . Alcohol use: No    Alcohol/week: 0.0 standard drinks  . Drug use: No   Family History  Problem Relation Age of Onset  . Migraines Mother   . Breast cancer Paternal Aunt        Contact  . Cancer Maternal Grandmother 53        brain tumor/breast cancer  . Breast cancer Maternal Grandmother   . Heart disease Maternal Grandfather        heart problems  . Colon cancer Neg Hx   . Esophageal cancer Neg Hx   . Liver disease Neg Hx   . Pancreatic cancer Neg Hx   . Stomach cancer Neg Hx   . Inflammatory bowel disease Neg Hx    Allergies  Allergen Reactions  . Amoxicillin Anaphylaxis  . Bee Venom Anaphylaxis  . Sulfa Antibiotics Anaphylaxis  . Sulfonamide Derivatives Anaphylaxis    Lungs close up  . Penicillins Rash  Has patient had a PCN reaction causing immediate rash, facial/tongue/throat swelling, SOB or lightheadedness with hypotension: No Has patient had a PCN reaction causing severe rash involving mucus membranes or skin necrosis: No Has patient had a PCN reaction that required hospitalization: No Has patient had a PCN reaction occurring within the last 10 years: No If all of the above answers are "NO", then may proceed with Cephalosporin use.    Current Outpatient Medications on File Prior to Visit  Medication Sig Dispense Refill  . acetaminophen (TYLENOL) 325 MG tablet Take 650 mg by mouth every 6 (six) hours as needed.    . benzonatate (TESSALON) 200 MG capsule Take 1 capsule (200 mg total) by mouth 3 (three) times daily as needed for cough. 30 capsule 3  . busPIRone (BUSPAR) 15 MG tablet TAKE 0.5 TABLETS (7.5 MG TOTAL) BY MOUTH 2 (TWO) TIMES DAILY. 90 tablet 1  . cetirizine (ZYRTEC) 10 MG tablet Take 1 tablet (10 mg total) by mouth daily. In evening 30 tablet 11  . montelukast (SINGULAIR) 10 MG tablet Take 1 tablet (10 mg total) by mouth at bedtime. 30 tablet 11  . zinc gluconate 50 MG tablet Take 1 tablet by mouth daily.     No current facility-administered medications on file prior to visit.    Review of Systems  Constitutional: Positive for fatigue. Negative for activity change, appetite change, fever and unexpected weight change.  HENT: Negative for congestion, ear pain, rhinorrhea,  sinus pressure and sore throat.   Eyes: Negative for pain, redness and visual disturbance.  Respiratory: Negative for cough, shortness of breath and wheezing.   Cardiovascular: Negative for chest pain and palpitations.  Gastrointestinal: Positive for abdominal pain. Negative for blood in stool, constipation and diarrhea.  Endocrine: Negative for polydipsia and polyuria.  Genitourinary: Negative for dysuria, frequency and urgency.  Musculoskeletal: Positive for myalgias. Negative for arthralgias and back pain.  Skin: Negative for pallor and rash.  Allergic/Immunologic: Negative for environmental allergies.  Neurological: Negative for dizziness, syncope and headaches.  Hematological: Negative for adenopathy. Does not bruise/bleed easily.  Psychiatric/Behavioral: Negative for decreased concentration and dysphoric mood. The patient is not nervous/anxious.        Objective:   Physical Exam Constitutional:      General: She is not in acute distress.    Appearance: Normal appearance. She is well-developed and normal weight. She is not ill-appearing or diaphoretic.     Comments: Overweight   HENT:     Head: Normocephalic and atraumatic.     Right Ear: Tympanic membrane, ear canal and external ear normal.     Left Ear: Tympanic membrane, ear canal and external ear normal.     Nose: Nose normal. No congestion.     Mouth/Throat:     Mouth: Mucous membranes are moist.     Pharynx: Oropharynx is clear. No posterior oropharyngeal erythema.  Eyes:     General: No scleral icterus.    Extraocular Movements: Extraocular movements intact.     Conjunctiva/sclera: Conjunctivae normal.     Pupils: Pupils are equal, round, and reactive to light.  Neck:     Thyroid: No thyromegaly.     Vascular: No carotid bruit or JVD.  Cardiovascular:     Rate and Rhythm: Normal rate and regular rhythm.     Pulses: Normal pulses.     Heart sounds: Normal heart sounds. No gallop.   Pulmonary:     Effort:  Pulmonary effort is normal. No respiratory distress.  Breath sounds: Normal breath sounds. No wheezing.     Comments: Good air exch  R lower ribs are mildly tender No M noted Chest:     Chest wall: No tenderness.  Abdominal:     General: Bowel sounds are normal. There is no distension or abdominal bruit.     Palpations: Abdomen is soft. There is no mass.     Tenderness: There is no abdominal tenderness.     Hernia: No hernia is present.  Genitourinary:    Comments: Breast exam: No mass, nodules, thickening, tenderness, bulging, retraction, inflamation, nipple discharge or skin changes noted.  No axillary or clavicular LA.     Musculoskeletal:        General: No tenderness. Normal range of motion.     Cervical back: Normal range of motion and neck supple. No rigidity. No muscular tenderness.     Right lower leg: No edema.     Left lower leg: No edema.  Lymphadenopathy:     Cervical: No cervical adenopathy.  Skin:    General: Skin is warm and dry.     Coloration: Skin is not pale.     Findings: No erythema or rash.     Comments: Solar lentigines diffusely   Neurological:     Mental Status: She is alert. Mental status is at baseline.     Cranial Nerves: No cranial nerve deficit.     Motor: No abnormal muscle tone.     Coordination: Coordination normal.     Gait: Gait normal.     Deep Tendon Reflexes: Reflexes are normal and symmetric. Reflexes normal.  Psychiatric:        Mood and Affect: Mood normal.        Cognition and Memory: Cognition and memory normal.           Assessment & Plan:   Problem List Items Addressed This Visit      Respiratory   Allergic rhinitis    More rhinorrhea that worsens chronic cough  Suggested zyrtec 10mg  at bedtime and flonase or nasacort ns daily through the season        Other   Routine general medical examination at a health care facility - Primary    Reviewed health habits including diet and exercise and skin cancer  prevention Reviewed appropriate screening tests for age  Also reviewed health mt list, fam hx and immunization status , as well as social and family history   See HPI Labs reviewed  Enc to proceed with new health program  Encouraged a covid booster  Mammogram planned  If her insurance covers a colonoscopy she will call for a referral  Continues gyn f/u with Dr Julaine Fusi        Elevated transaminase level    Mild and has happened in the past  No tylenol or alcohol May be fatty liver  H/o of obesity  Will follow  In wt loss program now /eating low fat and I suspect it will help      Chronic cough    Interested in ref to pulmonary  Disturbing sleep Treating allergies and GERD       Relevant Orders   Ambulatory referral to Pulmonology   Elevated glucose level    Lab Results  Component Value Date   HGBA1C 5.1 06/03/2020   Reassuring disc imp of low glycemic diet and wt loss to prevent DM2       Low HDL (under 40)    Disc goals for  lipids and reasons to control them Rev last labs with pt Rev low sat fat diet in detail Will eat more fish (or try fish oil) for the low HDL Eating better now

## 2020-06-14 NOTE — Patient Instructions (Addendum)
We can watch your liver tests over time  Avoid excessive tylenol or alcohol   Calcium and vitamin D are the most important supplements  Try to get 1200-1500 mg of calcium per day with at least 1000 iu of vitamin D - for bone health   Keep up the good work with diet and exercise   We will work on a pulmonary referral so you will get a call

## 2020-07-05 ENCOUNTER — Ambulatory Visit (INDEPENDENT_AMBULATORY_CARE_PROVIDER_SITE_OTHER): Payer: BC Managed Care – PPO | Admitting: Obstetrics and Gynecology

## 2020-07-05 ENCOUNTER — Encounter: Payer: Self-pay | Admitting: Obstetrics and Gynecology

## 2020-07-05 ENCOUNTER — Other Ambulatory Visit: Payer: Self-pay

## 2020-07-05 ENCOUNTER — Ambulatory Visit
Admission: RE | Admit: 2020-07-05 | Discharge: 2020-07-05 | Disposition: A | Discharge status: Home / Self Care | Payer: BC Managed Care – PPO | Source: Ambulatory Visit | Attending: Family Medicine | Admitting: Family Medicine

## 2020-07-05 VITALS — BP 110/66 | Ht 64.0 in | Wt 155.0 lb

## 2020-07-05 DIAGNOSIS — Z01419 Encounter for gynecological examination (general) (routine) without abnormal findings: Secondary | ICD-10-CM | POA: Diagnosis not present

## 2020-07-05 DIAGNOSIS — N761 Subacute and chronic vaginitis: Secondary | ICD-10-CM

## 2020-07-05 DIAGNOSIS — Z1231 Encounter for screening mammogram for malignant neoplasm of breast: Secondary | ICD-10-CM | POA: Diagnosis not present

## 2020-07-05 DIAGNOSIS — R5383 Other fatigue: Secondary | ICD-10-CM | POA: Diagnosis not present

## 2020-07-05 DIAGNOSIS — N951 Menopausal and female climacteric states: Secondary | ICD-10-CM

## 2020-07-05 DIAGNOSIS — Z1239 Encounter for other screening for malignant neoplasm of breast: Secondary | ICD-10-CM | POA: Diagnosis not present

## 2020-07-05 NOTE — Patient Instructions (Signed)
[facility] [address] [place] [address]  [facility] [place]  [address] [place] [address]  Phone: [phone]  

## 2020-07-05 NOTE — Progress Notes (Signed)
Gynecology Annual Exam  PCP: Abner Greenspan, MD  Chief Complaint:  Chief Complaint  Patient presents with  . Gynecologic Exam    Annual - white discharge with and odor, itching. RM 5    History of Present Illness: Patient is a 50 y.o. [id number] presents for annual exam. The patient has no complaints today.   LMP: Patient's last menstrual period was 01/08/2017.   The patient is sexually active. She currently uses status post hysterectomy for contraception. She reports some dyspareunia.  The patient does perform self breast exams.  There is no notable family history of breast or ovarian cancer in her family.  The patient wears seatbelts: yes.   The patient has regular exercise: not asked.    The patient denies current symptoms of depression.    Review of Systems: Review of Systems  Constitutional: Positive for malaise/fatigue. Negative for chills and fever.  HENT: Positive for sinus pain. Negative for congestion.   Eyes: Negative.   Respiratory: Positive for cough. Negative for shortness of breath.   Cardiovascular: Negative for chest pain and palpitations.  Gastrointestinal: Negative for abdominal pain, blood in stool, constipation, diarrhea, heartburn, melena, nausea and vomiting.  Genitourinary: Negative for dysuria, flank pain, frequency, hematuria and urgency.  Skin: Negative for itching and rash.  Neurological: Negative for dizziness, tingling, tremors, sensory change, speech change, focal weakness, seizures, loss of consciousness, weakness and headaches.  Endo/Heme/Allergies: Positive for environmental allergies. Negative for polydipsia.  Psychiatric/Behavioral: Negative for depression, hallucinations, memory loss, substance abuse and suicidal ideas. The patient is nervous/anxious. The patient does not have insomnia.     Past Medical History:  Patient Active Problem List   Diagnosis Date Noted  . Allergic rhinitis 12/25/2019  . Vaccine reaction 07/26/2019  .  Perimenopause 06/08/2018  . Low HDL (under 40) 06/08/2018  . [place] intestinal bacterial overgrowth 02/09/2018    + lactulose H2 breath test  34 ppm increase H2 and 8 ppm methane  Total is 42 and NL < 15  Rx Xifaxan   . Cyst of right kidney 06/04/2017  . RUQ abdominal pain 05/31/2017  . Elevated glucose level 05/22/2017  . S/P laparoscopic hysterectomy 01/14/2017  . Abdominal pain 07/27/2016  . Daily headache 06/01/2016  . Chronic cough 06/01/2016    Onset Dec 2019  - Allergy profile 05/19/19 >  Eos 0.1 /  IgE  8 - gabapentin 100 tid plus h2 bid  rec 05/20/2019 >>>    . Encounter for routine gynecological examination 05/23/2014  . Burning sensation of mouth 09/12/2013  . Elevated transaminase level 01/13/2011  . Routine general medical examination at a health care facility 01/05/2011  . KIDNEY STONE 03/27/2010    Qualifier: Diagnosis of  By: Lorelei Pont MD, Frederico Hamman     . Atypical chest pain 07/18/2008    Onset F3b 2019 = 4 months p abd hysterectomy for dysmenorrhea  - trial of ibs diet/ citrucel 05/19/2019 >>>       . STRESS REACTION, ACUTE, WITH EMOTIONAL DISTURBANCE 09/13/2007    Qualifier: Diagnosis of  By: Glori Bickers MD, Carmell Austria  Qualifier: Diagnosis of  By: Lia Hopping LPN, [place]     . HEADACHE 09/13/2007    Qualifier: Diagnosis of  By: Glori Bickers MD, Carmell Austria      Past Surgical History:  Past Surgical History:  Procedure Laterality Date  . ABDOMINAL HYSTERECTOMY    . CHOLECYSTECTOMY  06/1994  . COLONOSCOPY WITH PROPOFOL N/A 07/12/2017   Procedure: COLONOSCOPY WITH PROPOFOL;  Surgeon:  Lin Landsman, MD;  Location: [facility] ENDOSCOPY;  Service: Gastroenterology;  Laterality: N/A;  . CYSTOSCOPY N/A 01/14/2017   Procedure: CYSTOSCOPY;  Surgeon: Malachy Mood, MD;  Location: [facility] ORS;  Service: Gynecology;  Laterality: N/A;  . ESOPHAGOGASTRODUODENOSCOPY (EGD) WITH PROPOFOL N/A 07/12/2017   Procedure: ESOPHAGOGASTRODUODENOSCOPY (EGD) WITH PROPOFOL;  Surgeon: Lin Landsman,  MD;  Location: [facility] ENDOSCOPY;  Service: Gastroenterology;  Laterality: N/A;  . KIDNEY STONE SURGERY    . LITHOTRIPSY  03/2006  . TONSILECTOMY, ADENOIDECTOMY, BILATERAL MYRINGOTOMY AND TUBES     myringotomy tubes  . TOTAL LAPAROSCOPIC HYSTERECTOMY WITH SALPINGECTOMY Bilateral 01/14/2017   Procedure: HYSTERECTOMY TOTAL LAPAROSCOPIC WITH SALPINGECTOMY;  Surgeon: Malachy Mood, MD;  Location: [facility] ORS;  Service: Gynecology;  Laterality: Bilateral;    Gynecologic History:  Patient's last menstrual period was 01/08/2017. Contraception: status post hysterectomy Last Pap: Results were: N/A s/p prior TLH Last mammogram: 06/27/2019 Results were: BI-RAD I  Obstetric History: [id number]  Family History:  Family History  Problem Relation Age of Onset  . Migraines Mother   . Breast cancer Paternal Aunt        Contact  . Cancer Maternal Grandmother 34       brain tumor/breast cancer  . Breast cancer Maternal Grandmother   . Heart disease Maternal Grandfather        heart problems  . Colon cancer Neg Hx   . Esophageal cancer Neg Hx   . Liver disease Neg Hx   . Pancreatic cancer Neg Hx   . Stomach cancer Neg Hx   . Inflammatory bowel disease Neg Hx     Social History:  Social History   Socioeconomic History  . Marital status: Married    Spouse name: Not on file  . Number of children: 2  . Years of education: Not on file  . Highest education level: Not on file  Occupational History  . Occupation: Optician, dispensing: [employer]  Tobacco Use  . Smoking status: Never Smoker  . Smokeless tobacco: Never Used  Vaping Use  . Vaping Use: Never used  Substance and Sexual Activity  . Alcohol use: No    Alcohol/week: 0.0 standard drinks  . Drug use: No  . Sexual activity: Yes    Birth control/protection: Surgical    Comment: Hysterectomy  Other Topics Concern  . Not on file  Social History Narrative   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, she is an  Theatre manager   Rare caffeine and never smoker no alcohol no drug or substance use   Social Determinants of Radio broadcast assistant Strain: Not on file  Food Insecurity: Not on file  Transportation Needs: Not on file  Physical Activity: Not on file  Stress: Not on file  Social Connections: Not on file  Intimate Partner Violence: Not on file    Allergies:  Allergies  Allergen Reactions  . Amoxicillin Anaphylaxis  . Bee Venom Anaphylaxis  . Sulfa Antibiotics Anaphylaxis  . Sulfonamide Derivatives Anaphylaxis    Lungs close up  . Penicillins Rash    Has patient had a PCN reaction causing immediate rash, facial/tongue/throat swelling, SOB or lightheadedness with hypotension: No Has patient had a PCN reaction causing severe rash involving mucus membranes or skin necrosis: No Has patient had a PCN reaction that required hospitalization: No Has patient had a PCN reaction occurring within the last 10 years: No If all of the above answers are "NO", then may proceed  with Cephalosporin use.     Medications: Prior to Admission medications   Medication Sig Start Date End Date Taking? Authorizing Provider  acetaminophen (TYLENOL) 325 MG tablet Take 650 mg by mouth every 6 (six) hours as needed.    [provider]  benzonatate (TESSALON) 200 MG capsule Take 1 capsule (200 mg total) by mouth 3 (three) times daily as needed for cough. 12/25/19   Tower, Wynelle Fanny, MD  busPIRone (BUSPAR) 15 MG tablet TAKE 0.5 TABLETS (7.5 MG TOTAL) BY MOUTH 2 (TWO) TIMES DAILY. 03/20/20   Tower, Wynelle Fanny, MD  cetirizine (ZYRTEC) 10 MG tablet Take 1 tablet (10 mg total) by mouth daily. In evening 12/25/19   Tower, Wynelle Fanny, MD  montelukast (SINGULAIR) 10 MG tablet Take 1 tablet (10 mg total) by mouth at bedtime. 12/25/19   Tower, Wynelle Fanny, MD  zinc gluconate 50 MG tablet Take 1 tablet by mouth daily.    [provider]    Physical Exam Vitals: Blood pressure 110/66, height 5\' 4"  (1.626 m),  weight 155 lb (70.3 kg), last menstrual period 01/08/2017.   General: NAD HEENT: normocephalic, anicteric Thyroid: no enlargement, no palpable nodules Pulmonary: No increased work of breathing, CTAB Cardiovascular: RRR, distal pulses 2+ Breast: Breast symmetrical, no tenderness, no palpable nodules or masses, no skin or nipple retraction present, no nipple discharge.  No axillary or supraclavicular lymphadenopathy. Abdomen: NABS, soft, non-tender, non-distended.  Umbilicus without lesions.  No hepatomegaly, splenomegaly or masses palpable. No evidence of hernia  Genitourinary:  External: Normal external female genitalia.  Normal urethral meatus, normal Bartholin's and Skene's glands.    Vagina: Normal vaginal mucosa, no evidence of prolapse.    Cervix: surgically absent  Uterus: surgically absent  Adnexa: ovaries non-enlarged, no adnexal masses  Rectal: deferred  Lymphatic: no evidence of inguinal lymphadenopathy Extremities: no edema, erythema, or tenderness Neurologic: Grossly intact Psychiatric: mood appropriate, affect full  Female chaperone present for pelvic and breast  portions of the physical exam  Immunization History  Administered Date(s) Administered  . Influenza Split 01/13/2011, 02/02/2012  . Influenza Whole 03/22/2002, 12/27/2009  . Influenza,inj,Quad PF,6+ Mos 01/20/2013, 01/26/2014, 01/25/2015, 02/12/2016, 01/27/2017, 01/06/2018, 01/05/2019, 01/17/2020  . Janssen (J&J) SARS-COV-2 Vaccination 07/19/2019  . Td 06/15/2002  . Tdap 03/14/2013     Assessment: 51 y.o. G2P2002 routine annual exam  Plan: Problem List Items Addressed This Visit      Other   Encounter for routine gynecological examination - Primary    Other Visit Diagnoses    Breast screening       Breast cancer screening by mammogram       Relevant Orders   MM 3D SCREEN BREAST BILATERAL   Chronic vaginitis       Relevant Orders   NuSwab BV and Candida, NAA   Perimenopausal vasomotor symptoms        Relevant Orders   Follicle stimulating hormone (Completed)   Estradiol (Completed)   Fatigue, unspecified type       Relevant Orders   Follicle stimulating hormone (Completed)   Estradiol (Completed)      1) Mammogram - recommend yearly screening mammogram.  Mammogram Was ordered today   2) STI screening  was notoffered and therefore not obtained  3) ASCCP guidelines and rational discussed.  Patient opts for discontinue secondary to prior hysterectomy.  4) Contraception - the patient is currently using  status post hysterectomy.  She is not currently in need of contraception secondary to being sterile  5) Colonoscopy --  UTD 07/12/2017  6) Routine healthcare maintenance including cholesterol, diabetes screening discussed managed by PCP  7) Vaginal discharge/irritation - nuswab  8) Vasomotor symptoms - FSH/Estradiol  9) Return in about 1 year (around 07/05/2021) for annual.   Malachy Mood, MD, [place], [facility] 07/05/2020, 1:38 PM

## 2020-07-06 LAB — FOLLICLE STIMULATING HORMONE: FSH: 59.9 m[IU]/mL

## 2020-07-06 LAB — ESTRADIOL: Estradiol: 30.9 pg/mL

## 2020-07-08 ENCOUNTER — Encounter: Payer: Self-pay | Admitting: Internal Medicine

## 2020-07-08 ENCOUNTER — Other Ambulatory Visit: Payer: Self-pay

## 2020-07-08 ENCOUNTER — Ambulatory Visit: Payer: BC Managed Care – PPO | Admitting: Internal Medicine

## 2020-07-08 DIAGNOSIS — R0609 Other forms of dyspnea: Secondary | ICD-10-CM

## 2020-07-08 DIAGNOSIS — R0789 Other chest pain: Secondary | ICD-10-CM | POA: Diagnosis not present

## 2020-07-08 DIAGNOSIS — R0600 Dyspnea, unspecified: Secondary | ICD-10-CM | POA: Diagnosis not present

## 2020-07-08 DIAGNOSIS — R053 Chronic cough: Secondary | ICD-10-CM

## 2020-07-08 LAB — NUSWAB BV AND CANDIDA, NAA
Candida albicans, NAA: NEGATIVE
Candida glabrata, NAA: NEGATIVE

## 2020-07-08 MED ORDER — FAMOTIDINE 20 MG PO TABS: ORAL_TABLET | ORAL | 11 refills | Status: DC

## 2020-07-08 MED ORDER — METHYLPREDNISOLONE ACETATE 80 MG/ML IJ SUSP
120.0000 mg | Freq: Once | INTRAMUSCULAR | Status: AC
  Administered 2020-07-08: 120 mg via INTRAMUSCULAR

## 2020-07-08 MED ORDER — PANTOPRAZOLE SODIUM 40 MG PO TBEC: 40.0000 mg | DELAYED_RELEASE_TABLET | Freq: Every day | ORAL | 2 refills | Status: DC

## 2020-07-08 NOTE — Progress Notes (Signed)
Courtney Pineda, female    DOB: 24-Dec-1970  MRN: [id number]   Brief patient profile:  59  yowf never smoker onset of new cough Dec 2019 waxed and waned since assoc doe   so referred to pulmonary clinic 05/19/2019 by Dr   Glori Bickers.    Pt underwent abdominal hysterectomy Oct 2018 for dysmenorrhea, then Feb 2019 started having RUQ/lateral severe crampy pain =  comes and goes always better lying down no assoc nausea vomiting wt loss/  not pleuritic.    History of Present Illness  05/19/2019  Pulmonary/ 1st office eval/Jazira Maloney  Chief Complaint  Patient presents with  . Re consult     Pt being referred to office due to chronic cough x1 year. Pt states she will become SOB at times as well.  Pt states her cough is worse in the morning.  Dyspnea:  Steps are a problem o/w good activity activity /could  do over an hour treadmill has not done so  since Jan 2 21  Cough: sensation of throat drainage / ent eval [facility] rx diflucan no better / took ppi x one week no better/ no better with zyrtec with urge to clear the throat during the day /no excess mucus Sleep: R chest pain resolves supine as does the cough and sleeps fine once asleep SABA use: not picked up yet  rec Pepcid 20 mg in am and after supper  Gabapentin 100 mg three times daily  - if makes head feel funny  start with just one a day for a week then add one per week. Citrucel 1sp twice daily with large glass of water.  For cough, throat tickle or pain > tessalon 200mg  up to three times daily  GERD diet           07/08/2020  f/u ov/Gilman Olazabal re: cough since Dec 2019 Chief Complaint  Patient presents with  . Consult    Cough  Chronic cough Onset Dec 2019  - gabapentin 100 tid plus h2 bid  rec 05/20/2019 >>> never took it at this dose >>>> If not better to her satisfaction:  F/u in 4 weeks with all meds> did not return as rec   Atypical chest pain Onset Feb 2019 = 4 months p abd hysterectomy for dysmenorrhea  - trial of ibs diet/ citrucel  05/19/2019 >>> resolved     Dyspnea:  Rides bike /treadmill not doing it regularly, not making her cough Cough: mostly daytime / no problem in am on waking/ dry  / no better with tessalon No worse laughing or smelling strong smells/ worse with tomato sauce  Zyrtec helped the nasal congestion/ drainage but not the cough   Sleeping: on side bed is flat / has electric  bed but not elevating  SABA use: none  02: none  Covid status:   J&J only         No obvious day to day or daytime variability or assoc excess/ purulent sputum or mucus plugs or hemoptysis or cp or chest tightness, subjective wheeze or overt sinus or hb symptoms.   Sleeping  without nocturnal  or early am exacerbation  of respiratory  c/o's or need for noct saba. Also denies any obvious fluctuation of symptoms with weather or environmental changes or other aggravating or alleviating factors except as outlined above   No unusual exposure hx or h/o childhood pna/ asthma or knowledge of premature birth.  Current Allergies, Complete Past Medical History, Past Surgical History, Family History, and Social  History were reviewed in [facility] record.  ROS  The following are not active complaints unless bolded Hoarseness, sore throat, dysphagia, dental problems, itching, sneezing,  nasal congestion or discharge of excess mucus or purulent secretions, ear ache,   fever, chills, sweats, unintended wt loss or wt gain, classically pleuritic or exertional cp,  orthopnea pnd or arm/hand swelling  or leg swelling, presyncope, palpitations, abdominal pain, anorexia, nausea, vomiting, diarrhea  or change in bowel habits or change in bladder habits, change in stools or change in urine, dysuria, hematuria,  rash, arthralgias, visual complaints, headache, numbness, weakness or ataxia or problems with walking or coordination,  change in mood or  memory.        Current Meds  Medication Sig  . acetaminophen (TYLENOL) 325 MG  tablet Take 650 mg by mouth every 6 (six) hours as needed.  . benzonatate (TESSALON) 200 MG capsule Take 1 capsule (200 mg total) by mouth 3 (three) times daily as needed for cough.  . cetirizine (ZYRTEC) 10 MG tablet Take 1 tablet (10 mg total) by mouth daily. In evening         Past Medical History:  Diagnosis Date  . Anemia   . Atypical moles    abnormal moles removed from neck.  . Chronic right upper quadrant pain   . History of kidney stones   . Hyperemesis gravidarum 1999  . [place] intestinal bacterial overgrowth 02/09/2018        Objective:       Wt Readings from Last 3 Encounters:  07/08/20 156 lb 6.4 oz (70.9 kg)  07/05/20 155 lb (70.3 kg)  06/14/20 159 lb 3 oz (72.2 kg)      Vital signs reviewed  07/08/2020  - Note at rest 02 sats  97% on RA   General appearance:    amb somber wf, occ dry cough       HEENT : pt wearing mask not removed for exam due to covid -19 concerns.    NECK :  without JVD/Nodes/TM/ nl carotid upstrokes bilaterally   LUNGS: no acc muscle use,  Nl contour chest which is clear to A and P bilaterally without cough on insp or exp maneuvers   CV:  RRR  no s3 or murmur or increase in P2, and no edema   ABD:  soft and nontender with nl inspiratory excursion in the supine position. No bruits or organomegaly appreciated, bowel sounds nl  MS:  Nl gait/ ext warm without deformities, calf tenderness, cyanosis or clubbing No obvious joint restrictions   SKIN: warm and dry without lesions    NEURO:  alert, approp, nl sensorium with  no motor or cerebellar deficits apparent.         Assessment

## 2020-07-08 NOTE — Patient Instructions (Addendum)
Depomedrol 120 mg  IM   To get the most out of exercise, you need to be continuously aware that you are short of breath, but never out of breath, for 30 minutes daily. As you improve, it will actually be easier for you to do the same amount of exercise  in  30 minutes so always push to the level where you are short of breath.    After 2 weeks you should see a benefit, and if not we will schedule a cpst   Pantoprazole (protonix) 40 mg   Take  30-60 min before first meal of the day and Pepcid (famotidine)  20 mg one @  bedtime until return to office - this is the best way to tell whether stomach acid is contributing to your problem.    GERD (REFLUX)  is an extremely common cause of respiratory symptoms just like yours , many times with no obvious heartburn at all.    It can be treated with medication, but also with lifestyle changes including elevation of the head of your bed (ideally with 6 -8inch blocks under the headboard of your bed),  Smoking cessation, avoidance of late meals, excessive alcohol, and avoid fatty foods, chocolate, peppermint, colas, red wine, and acidic juices such as orange juice.  NO MINT OR MENTHOL PRODUCTS SO NO COUGH DROPS  USE SUGARLESS CANDY INSTEAD (Jolley ranchers or Stover's or Life Savers) or even ice chips will also do - the key is to swallow to prevent all throat clearing. NO OIL BASED VITAMINS - use powdered substitutes.  Avoid fish oil when coughing.    We will call to schedule a Methacholine challenge in 2 weeks   Please schedule a follow up office visit in 4 weeks, call sooner if needed

## 2020-07-09 ENCOUNTER — Telehealth: Payer: Self-pay | Admitting: Internal Medicine

## 2020-07-09 ENCOUNTER — Encounter: Payer: Self-pay | Admitting: Internal Medicine

## 2020-07-09 DIAGNOSIS — R0609 Other forms of dyspnea: Secondary | ICD-10-CM | POA: Insufficient documentation

## 2020-07-09 DIAGNOSIS — R0600 Dyspnea, unspecified: Secondary | ICD-10-CM | POA: Insufficient documentation

## 2020-07-09 NOTE — Assessment & Plan Note (Signed)
Onset Dec 2019  - Allergy profile 05/19/19 >  Eos 0.1 /  IgE  8 - gabapentin 100 tid plus h2 bid  rec 05/20/2019 >>> could not tolerate even one dose of gabapentin  - 07/08/2020 rec max gerd rx/ depomedrol and then MCT   Of the three most common causes of  Sub-acute / recurrent or chronic cough, only one (GERD)  can actually contribute to/ trigger  the other two (asthma and post nasal drip syndrome)  and perpetuate the cylce of cough.  While not intuitively obvious, many patients with chronic low grade reflux do not cough until there is a primary insult that disturbs the protective epithelial barrier and exposes sensitive nerve endings.   This is typically viral but can due to PNDS and  either may apply here.     >>> The point is that once this occurs, it is difficult to eliminate the cycle  using anything but a maximally effective acid suppression regimen at least in the short run, accompanied by an appropriate diet to address non acid GERD and control pnds with zytec plus Depomedrol 120 mg IM  in case of component of Th-2 driven upper or lower airways inflammation (if cough responds short term only to relapse before return while will on full rx for gerrd, then  that would point to allergic rhinitis/ asthma or eos bronchitis as alternative dx)   >>> f/u in 4 weeks

## 2020-07-09 NOTE — Assessment & Plan Note (Signed)
Onset Feb 2019 = 4 months p abd hysterectomy for dysmenorrhea  - trial of ibs diet/ citrucel 05/19/2019 >>> resolved to her satisfaction as of f/u ov 07/08/2020   >>> does not credit the diet change nor the citrucel as having helped her "it just went away"

## 2020-07-09 NOTE — Assessment & Plan Note (Addendum)
Unclear onset, never really much aerobic activity  -  07/08/2020   Walked RA  3  laps @ approx 275ft each @ nl pace  stopped due to end of study,  no sob and sats lowest = 96%   I strongly suspect this is a conditioning issue and advised on a sub max ex program x 2 weeks and if not improving CPST next          Each maintenance medication was reviewed in detail including emphasizing most importantly the difference between maintenance and prns and under what circumstances the prns are to be triggered using an action plan format where appropriate.  Total time for H and P, chart review, counseling,  directly observing portions of ambulatory 02 saturation study/ and generating customized AVS unique to this office visit / same day charting  > 40 min

## 2020-07-10 ENCOUNTER — Ambulatory Visit: Payer: BC Managed Care – PPO | Admitting: Obstetrics and Gynecology

## 2020-07-10 ENCOUNTER — Other Ambulatory Visit: Payer: Self-pay | Admitting: Obstetrics and Gynecology

## 2020-07-10 MED ORDER — METRONIDAZOLE 500 MG PO TABS: 500.0000 mg | ORAL_TABLET | Freq: Two times a day (BID) | ORAL | 0 refills | Status: DC

## 2020-07-16 NOTE — Telephone Encounter (Signed)
Spoke to [employer] [email or web address]  and no pa is req for the PFT she is having done on 07/23/11 pt is aware Courtney Pineda

## 2020-07-19 ENCOUNTER — Other Ambulatory Visit: Payer: Self-pay

## 2020-07-19 ENCOUNTER — Other Ambulatory Visit
Admission: RE | Admit: 2020-07-19 | Discharge: 2020-07-19 | Disposition: A | Discharge status: Home / Self Care | Payer: BC Managed Care – PPO | Source: Ambulatory Visit | Attending: Internal Medicine | Admitting: Internal Medicine

## 2020-07-19 DIAGNOSIS — Z01812 Encounter for preprocedural laboratory examination: Secondary | ICD-10-CM | POA: Diagnosis not present

## 2020-07-19 DIAGNOSIS — Z20822 Contact with and (suspected) exposure to covid-19: Secondary | ICD-10-CM | POA: Insufficient documentation

## 2020-07-20 LAB — SARS CORONAVIRUS 2 (TAT 6-24 HRS): SARS Coronavirus 2: NEGATIVE

## 2020-07-22 ENCOUNTER — Inpatient Hospital Stay (HOSPITAL_COMMUNITY): Admission: RE | Admit: 2020-07-22 | Payer: BC Managed Care – PPO | Source: Ambulatory Visit

## 2020-07-22 ENCOUNTER — Encounter: Payer: Self-pay | Admitting: *Deleted

## 2020-07-22 ENCOUNTER — Other Ambulatory Visit: Payer: Self-pay

## 2020-07-22 ENCOUNTER — Ambulatory Visit (HOSPITAL_COMMUNITY)
Admission: RE | Admit: 2020-07-22 | Discharge: 2020-07-22 | Disposition: A | Discharge status: Home / Self Care | Payer: BC Managed Care – PPO | Source: Ambulatory Visit | Attending: Internal Medicine | Admitting: Internal Medicine

## 2020-07-22 DIAGNOSIS — R053 Chronic cough: Secondary | ICD-10-CM | POA: Diagnosis not present

## 2020-07-22 LAB — PULMONARY FUNCTION TEST
FEF 25-75 Post: 2.41 L/sec
FEF 25-75 Pre: 1.94 L/sec
FEF2575-%Change-Post: 24 %
FEF2575-%Pred-Post: 83 %
FEF2575-%Pred-Pre: 67 %
FEV1-%Change-Post: 6 %
FEV1-%Pred-Post: 72 %
FEV1-%Pred-Pre: 68 %
FEV1-Post: 2.08 L
FEV1-Pre: 1.96 L
FEV1FVC-%Change-Post: 5 %
FEV1FVC-%Pred-Pre: 101 %
FEV6-%Change-Post: 0 %
FEV6-%Pred-Post: 68 %
FEV6-%Pred-Pre: 68 %
FEV6-Post: 2.42 L
FEV6-Pre: 2.4 L
FEV6FVC-%Pred-Post: 102 %
FEV6FVC-%Pred-Pre: 102 %
FVC-%Change-Post: 0 %
FVC-%Pred-Post: 67 %
FVC-%Pred-Pre: 66 %
FVC-Post: 2.42 L
FVC-Pre: 2.4 L
Post FEV1/FVC ratio: 86 %
Post FEV6/FVC ratio: 100 %
Pre FEV1/FVC ratio: 82 %
Pre FEV6/FVC Ratio: 100 %

## 2020-07-22 MED ORDER — ALBUTEROL SULFATE (2.5 MG/3ML) 0.083% IN NEBU
2.5000 mg | INHALATION_SOLUTION | Freq: Once | RESPIRATORY_TRACT | Status: AC
  Administered 2020-07-22: 2.5 mg via RESPIRATORY_TRACT

## 2020-07-24 NOTE — Progress Notes (Signed)
Called and left message on voicemail to please return phone call to go over PFT results. Contact number provided.

## 2020-07-24 NOTE — Progress Notes (Signed)
Called and went over PFT results per Dr Melvyn Novas with patient. All questions answered and patient expressed full understanding. Confirmed upcoming scheduled office visit with NP on 08/05/2020. Nothing further needed at this time.

## 2020-08-05 ENCOUNTER — Other Ambulatory Visit: Payer: Self-pay

## 2020-08-05 ENCOUNTER — Telehealth: Payer: Self-pay | Admitting: Primary Care

## 2020-08-05 ENCOUNTER — Ambulatory Visit: Payer: BC Managed Care – PPO | Admitting: Primary Care

## 2020-08-05 ENCOUNTER — Encounter: Payer: Self-pay | Admitting: Primary Care

## 2020-08-05 DIAGNOSIS — R053 Chronic cough: Secondary | ICD-10-CM

## 2020-08-05 DIAGNOSIS — R0600 Dyspnea, unspecified: Secondary | ICD-10-CM

## 2020-08-05 DIAGNOSIS — R0609 Other forms of dyspnea: Secondary | ICD-10-CM

## 2020-08-05 NOTE — Patient Instructions (Addendum)
Recommendations: - Resume Zyrtec 10mg  daily - Add Flonase nasal spray once daily - Continue pantoprazole and Pepcid - I will reach out ot Dr. Melvyn Novas about methacholine change or further testing  Follow-up:  - Make apt with Dr. Melvyn Novas for 3-6 months or sooner if needed

## 2020-08-05 NOTE — Telephone Encounter (Signed)
-----   Message from Tanda Rockers, MD sent at 08/05/2020  5:15 PM EDT ----- Regarding: RE: Cough patient; you had ordered methacholine but only had PFT I would order the methacholine first. ----- Message ----- From: Martyn Ehrich, NP Sent: 08/05/2020   3:39 PM EDT To: Tanda Rockers, MD Subject: Cough patient; you had ordered methacholine #  Patient still has cough, this is her main complaint. She experiences dyspnea with exercise. She has started an exercise regimen. She thinks this has helped her breathing some.  Her cough improved after depomedrol injection for 1 day, than symptoms returned. PFTs showed min obstruction without BD response.   You had originally wanted methacholine challenge. Also in your note said CPST if not improved  Do you recommend either of these test?   -[employer]

## 2020-08-05 NOTE — Progress Notes (Signed)
@Patient  ID: Margot Chimes, female    DOB: [birth year], 50 y.o.   MRN: [id number]  Chief Complaint  Patient presents with  . Follow-up    Chronic cough    Referring provider: Tower, Wynelle Fanny, MD  HPI: 50 year old female, Never smoked.  Past medical history significant for chronic cough, allergic rhinitis, SIBO, kidney stones, laparoscopic hysterectomy.  Patient of Dr. Melvyn Novas, last seen in office on 07/08/2020.  Previous [facility] pulmonary encounter: 05/19/2019  Pulmonary/ 1st office eval/[facility]  Chief Complaint  Patient presents with  . Re consult     Pt being referred to office due to chronic cough x1 year. Pt states she will become SOB at times as well.  Pt states her cough is worse in the morning.  Dyspnea:  Steps are a problem o/w good activity activity /could  do over an hour treadmill has not done so  since Jan 2 21  Cough: sensation of throat drainage / ent eval [facility] rx diflucan no better / took ppi x one week no better/ no better with zyrtec with urge to clear the throat during the day /no excess mucus Sleep: R chest pain resolves supine as does the cough and sleeps fine once asleep SABA use: not picked up yet  rec Pepcid 20 mg in am and after supper  Gabapentin 100 mg three times daily  - if makes head feel funny  start with just one a day for a week then add one per week. Citrucel 1sp twice daily with large glass of water.  For cough, throat tickle or pain > tessalon 200mg  up to three times daily  GERD diet  07/08/2020  f/u ov/[facility] re: cough since Dec 2019 Chief Complaint  Patient presents with  . Consult    Cough  Chronic cough Onset Dec 2019  - gabapentin 100 tid plus h2 bid  rec 05/20/2019 >>> never took it at this dose >>>> If not better to her satisfaction:  F/u in 4 weeks with all meds> did not return as rec   Atypical chest pain Onset Feb 2019 = 4 months p abd hysterectomy for dysmenorrhea  - trial of ibs diet/ citrucel 05/19/2019 >>> resolved     Dyspnea:   Rides bike /treadmill not doing it regularly, not making her cough Cough: mostly daytime / no problem in am on waking/ dry  / no better with tessalon No worse laughing or smelling strong smells/ worse with tomato sauce  Zyrtec helped the nasal congestion/ drainage but not the cough   Sleeping: on side bed is flat / has electric  bed but not elevating  SABA use: none  02: none  Covid status:   J&J only    08/05/2020- Interim hx  Patient presents today for 4-week follow-up.  During most recent office visit with Dr. Melvyn Novas she received Depo-Medrol 120 mg IM injection for cyclic cough. Recommended start pantoprazole 40 mg every morning and Pepcid 20 mg at bedtime. PFTs were unremarkable, minimal obstructive airways disease without BD response. Patient was encouraged to participate in exercise program. After 2 weeks if no benefit we will schedule for CPST. CXR on 05/20/19 showed clear lungs.   Her main complaint is cough. Her cough improved after depomedrol injection for 1 day, than symptoms returned. She experiences dyspnea with exercise. She has started an exercise regimen. She thinks this has helped her breathing some. Moving around for a few hours and will start coughing again. Coughing when showering. Following strict GERD diet. She  is sleeping with head of bed elevated. She does not consistently take pantroprozole, she has taken it for the most part. Some days she forgets to take it before eating. She has been walking on treadmill 30 mins average 3-4 times a week.    Cough Onset Dec 2019  - Allergy profile 05/19/19 >  Eos 0.1 /  IgE  8 - gabapentin 100 tid plus h2 bid  rec 05/20/2019 >>> could not tolerate even one dose of gabapentin  - 07/08/2020 rec max gerd rx/ depomedrol and then MCT   Allergies  Allergen Reactions  . Amoxicillin Anaphylaxis  . Bee Venom Anaphylaxis  . Sulfa Antibiotics Anaphylaxis  . Sulfonamide Derivatives Anaphylaxis    Lungs close up  . Penicillins Rash    Has patient had  a PCN reaction causing immediate rash, facial/tongue/throat swelling, SOB or lightheadedness with hypotension: No Has patient had a PCN reaction causing severe rash involving mucus membranes or skin necrosis: No Has patient had a PCN reaction that required hospitalization: No Has patient had a PCN reaction occurring within the last 10 years: No If all of the above answers are "NO", then may proceed with Cephalosporin use.     Immunization History  Administered Date(s) Administered  . Influenza Split 01/13/2011, 02/02/2012  . Influenza Whole 03/22/2002, 12/27/2009  . Influenza,inj,Quad PF,6+ Mos 01/20/2013, 01/26/2014, 01/25/2015, 02/12/2016, 01/27/2017, 01/06/2018, 01/05/2019, 01/17/2020  . Janssen (J&J) SARS-COV-2 Vaccination 07/19/2019  . Td 06/15/2002  . Tdap 03/14/2013    Past Medical History:  Diagnosis Date  . Anemia   . Chronic right upper quadrant pain   . Dysplastic nevus 09/16/2015   left infra axillary  . Dysplastic nevus 10/27/2017   left side  . History of kidney stones   . Hyperemesis gravidarum 1999  . [place] intestinal bacterial overgrowth 02/09/2018    Tobacco History: Social History   Tobacco Use  Smoking Status Never Smoker  Smokeless Tobacco Never Used   Counseling given: Not Answered   Outpatient Medications Prior to Visit  Medication Sig Dispense Refill  . acetaminophen (TYLENOL) 325 MG tablet Take 650 mg by mouth every 6 (six) hours as needed.    . cetirizine (ZYRTEC) 10 MG tablet Take 1 tablet (10 mg total) by mouth daily. In evening 30 tablet 11  . famotidine (PEPCID) 20 MG tablet One after supper 30 tablet 11  . metroNIDAZOLE (FLAGYL) 500 MG tablet Take 1 tablet (500 mg total) by mouth 2 (two) times daily. 14 tablet 0  . pantoprazole (PROTONIX) 40 MG tablet Take 1 tablet (40 mg total) by mouth daily. Take 30-60 min before first meal of the day 30 tablet 2   No facility-administered medications prior to visit.   Review of Systems  Review of  Systems  Constitutional: Negative.   HENT: Negative.   Respiratory: Positive for cough. Negative for shortness of breath.        Dyspnea with moderate exertion/exercise  Cardiovascular: Negative.    Physical Exam  BP 114/68 (BP Location: Left Arm, Cuff Size: Normal)   Pulse (!) 56   Temp 97.6 F (36.4 C) (Oral)   Ht 5\' 4"  (1.626 m)   Wt 147 lb 9.6 oz (67 kg)   LMP 01/08/2017   SpO2 97%   BMI 25.34 kg/m  Physical Exam Constitutional:      Appearance: Normal appearance.  HENT:     Mouth/Throat:     Comments: Deferred d/t masking Cardiovascular:     Rate and Rhythm: Normal rate and  regular rhythm.  Pulmonary:     Effort: Pulmonary effort is normal.     Breath sounds: Normal breath sounds. No wheezing, rhonchi or rales.  Musculoskeletal:        General: Normal range of motion.  Skin:    General: Skin is warm and dry.  Neurological:     General: No focal deficit present.     Mental Status: She is alert and oriented to person, place, and time. Mental status is at baseline.  Psychiatric:        Mood and Affect: Mood normal.        Behavior: Behavior normal.        Thought Content: Thought content normal.        Judgment: Judgment normal.      Lab Results:  CBC    Component Value Date/Time   WBC 5.9 06/03/2020 0839   RBC 4.85 06/03/2020 0839   HGB 14.9 06/03/2020 0839   HGB 16.1 (H) 11/17/2018 1526   HCT 44.1 06/03/2020 0839   HCT 46.9 (H) 11/17/2018 1526   PLT 276.0 06/03/2020 0839   PLT 281 11/17/2018 1526   MCV 91.0 06/03/2020 0839   MCV 90 11/17/2018 1526   MCH 30.7 11/17/2018 1526   MCH 21.7 (L) 01/15/2017 0424   MCHC 33.7 06/03/2020 0839   RDW 13.1 06/03/2020 0839   RDW 11.7 11/17/2018 1526   LYMPHSABS 2.2 06/03/2020 0839   MONOABS 0.5 06/03/2020 0839   EOSABS 0.1 06/03/2020 0839   BASOSABS 0.0 06/03/2020 0839    BMET    Component Value Date/Time   NA 141 06/03/2020 0839   K 4.5 06/03/2020 0839   CL 104 06/03/2020 0839   CO2 29 06/03/2020  0839   GLUCOSE 79 06/03/2020 0839   BUN 12 06/03/2020 0839   CREATININE 0.89 06/03/2020 0839   CALCIUM 9.8 06/03/2020 0839   GFRNONAA >60 01/15/2017 0424   GFRAA >60 01/15/2017 0424    BNP No results found for: BNP  ProBNP No results found for: PROBNP  Imaging: No results found.   Assessment & Plan:   Chronic cough - Cough improved after receiving depomedrol shot - PFTs were unremarkable with minimal obstructive airway disease without BD response. CXR in February 2022 showed clear lungs.  - Resume Zyrtec 10mg  daily. Add Flonase nasal spray once daily - Continue pantoprazole and Pepcid - Ordering methacholine challenge to rule out asthma as cause of cough  - FU in 3 months with Dr. Alethia Berthold, NP 08/20/2020

## 2020-08-05 NOTE — Telephone Encounter (Signed)
Can you please let patient know Dr. Melvyn Novas would like use to check methacholine challenge to definitely rule in or out asthma. Please order  -Beth

## 2020-08-07 NOTE — Telephone Encounter (Signed)
Called and spoke with pt letting her know the info stated by [facility] after discussion with MW. Stated to pt that we were going to order methacholine challenge to try to see if her symptoms were due to asthma or not. Pt verbalized understanding. Order placed. Nothing further needed.

## 2020-08-20 NOTE — Assessment & Plan Note (Addendum)
-   Cough improved after receiving depomedrol shot - PFTs were unremarkable with minimal obstructive airway disease without BD response. CXR in February 2022 showed clear lungs.  - Resume Zyrtec 10mg  daily. Add Flonase nasal spray once daily - Continue pantoprazole and Pepcid - Ordering methacholine challenge to rule out asthma as cause of cough  - FU in 3 months with Courtney Pineda

## 2020-09-18 ENCOUNTER — Other Ambulatory Visit
Admission: RE | Admit: 2020-09-18 | Discharge: 2020-09-18 | Disposition: A | Discharge status: Home / Self Care | Payer: BC Managed Care – PPO | Source: Ambulatory Visit | Attending: Internal Medicine | Admitting: Internal Medicine

## 2020-09-18 ENCOUNTER — Other Ambulatory Visit: Payer: Self-pay

## 2020-09-18 DIAGNOSIS — Z01812 Encounter for preprocedural laboratory examination: Secondary | ICD-10-CM | POA: Diagnosis not present

## 2020-09-18 DIAGNOSIS — Z20822 Contact with and (suspected) exposure to covid-19: Secondary | ICD-10-CM | POA: Diagnosis not present

## 2020-09-18 LAB — SARS CORONAVIRUS 2 (TAT 6-24 HRS): SARS Coronavirus 2: NEGATIVE

## 2020-09-20 ENCOUNTER — Inpatient Hospital Stay (HOSPITAL_COMMUNITY)
Admission: RE | Admit: 2020-09-20 | Discharge: 2020-09-20 | Disposition: A | Discharge status: Home / Self Care | Payer: BC Managed Care – PPO | Source: Ambulatory Visit | Attending: Primary Care | Admitting: Primary Care

## 2020-09-20 ENCOUNTER — Other Ambulatory Visit: Payer: Self-pay

## 2020-09-20 ENCOUNTER — Encounter: Payer: Self-pay | Admitting: *Deleted

## 2020-09-20 ENCOUNTER — Ambulatory Visit (HOSPITAL_COMMUNITY)
Admission: RE | Admit: 2020-09-20 | Discharge: 2020-09-20 | Disposition: A | Discharge status: Home / Self Care | Payer: BC Managed Care – PPO | Source: Ambulatory Visit | Attending: Internal Medicine | Admitting: Internal Medicine

## 2020-09-20 DIAGNOSIS — R0600 Dyspnea, unspecified: Secondary | ICD-10-CM | POA: Insufficient documentation

## 2020-09-20 DIAGNOSIS — R0609 Other forms of dyspnea: Secondary | ICD-10-CM

## 2020-09-20 LAB — PULMONARY FUNCTION TEST
FEF 25-75 Post: 2.77 L/sec
FEF 25-75 Pre: 1.84 L/sec
FEF2575-%Change-Post: 50 %
FEF2575-%Pred-Post: 96 %
FEF2575-%Pred-Pre: 63 %
FEV1-%Change-Post: 11 %
FEV1-%Pred-Post: 74 %
FEV1-%Pred-Pre: 66 %
FEV1-Post: 2.13 L
FEV1-Pre: 1.9 L
FEV1FVC-%Change-Post: 11 %
FEV1FVC-%Pred-Pre: 98 %
FEV6-%Change-Post: 0 %
FEV6-%Pred-Post: 68 %
FEV6-%Pred-Pre: 68 %
FEV6-Post: 2.41 L
FEV6-Pre: 2.4 L
FEV6FVC-%Pred-Post: 102 %
FEV6FVC-%Pred-Pre: 102 %
FVC-%Change-Post: 0 %
FVC-%Pred-Post: 66 %
FVC-%Pred-Pre: 66 %
FVC-Post: 2.41 L
FVC-Pre: 2.4 L
Post FEV1/FVC ratio: 89 %
Post FEV6/FVC ratio: 100 %
Pre FEV1/FVC ratio: 79 %
Pre FEV6/FVC Ratio: 100 %

## 2020-09-20 MED ORDER — METHACHOLINE 1 MG/ML NEB SOLN
3.0000 mL | Freq: Once | RESPIRATORY_TRACT | Status: DC
  Filled 2020-09-20: qty 3

## 2020-09-20 MED ORDER — METHACHOLINE 0.0625 MG/ML NEB SOLN
3.0000 mL | Freq: Once | RESPIRATORY_TRACT | Status: AC
  Administered 2020-09-20: 0.0625 mg via RESPIRATORY_TRACT
  Filled 2020-09-20: qty 3

## 2020-09-20 MED ORDER — METHACHOLINE 0 MG/ML NEB SOLN
3.0000 mL | Freq: Once | RESPIRATORY_TRACT | Status: AC
  Administered 2020-09-20: 3 mL via RESPIRATORY_TRACT
  Filled 2020-09-20: qty 3

## 2020-09-20 MED ORDER — METHACHOLINE 16 MG/ML NEB SOLN
3.0000 mL | Freq: Once | RESPIRATORY_TRACT | Status: DC
  Filled 2020-09-20: qty 3

## 2020-09-20 MED ORDER — ALBUTEROL SULFATE (2.5 MG/3ML) 0.083% IN NEBU
2.5000 mg | INHALATION_SOLUTION | Freq: Once | RESPIRATORY_TRACT | Status: AC
  Administered 2020-09-20: 2.5 mg via RESPIRATORY_TRACT

## 2020-09-20 MED ORDER — METHACHOLINE 4 MG/ML NEB SOLN
3.0000 mL | Freq: Once | RESPIRATORY_TRACT | Status: DC
  Filled 2020-09-20: qty 3

## 2020-09-20 MED ORDER — METHACHOLINE 0.25 MG/ML NEB SOLN
3.0000 mL | Freq: Once | RESPIRATORY_TRACT | Status: AC
  Administered 2020-09-20: 0.75 mg via RESPIRATORY_TRACT
  Filled 2020-09-20: qty 3

## 2020-09-20 NOTE — Progress Notes (Signed)
Can you review methacholine challenge. Her FEV1 dropped 17 points. Do you feel this is consistent with asthma dx?

## 2020-09-21 ENCOUNTER — Other Ambulatory Visit: Payer: Self-pay | Admitting: Internal Medicine

## 2020-09-21 ENCOUNTER — Other Ambulatory Visit: Payer: Self-pay | Admitting: Family Medicine

## 2020-09-27 NOTE — Progress Notes (Signed)
Please let patient know her PFTs showed moderate obstruction/restriction, they appear consistent with asthma. Can you ask if she experienced any cough, chest tightness, sob after the methacholine challenge.

## 2020-10-02 NOTE — Progress Notes (Signed)
Ok we will address at follow-up in early August

## 2020-10-14 ENCOUNTER — Encounter: Payer: Self-pay | Admitting: Family Medicine

## 2020-10-18 ENCOUNTER — Other Ambulatory Visit: Payer: Self-pay

## 2020-10-18 ENCOUNTER — Ambulatory Visit: Payer: BC Managed Care – PPO | Admitting: Family Medicine

## 2020-10-18 ENCOUNTER — Encounter: Payer: Self-pay | Admitting: Family Medicine

## 2020-10-18 VITALS — BP 102/68 | HR 57 | Temp 98.1°F | Ht 64.0 in | Wt 140.2 lb

## 2020-10-18 DIAGNOSIS — R7401 Elevation of levels of liver transaminase levels: Secondary | ICD-10-CM | POA: Diagnosis not present

## 2020-10-18 DIAGNOSIS — R1011 Right upper quadrant pain: Secondary | ICD-10-CM | POA: Diagnosis not present

## 2020-10-18 NOTE — Progress Notes (Signed)
Subjective:    Patient ID: Courtney Pineda, female    DOB: May 28, 1970, 50 y.o.   MRN: [id number]  This visit occurred during the SARS-CoV-2 public health emergency.  Safety protocols were in place, including screening questions prior to the visit, additional usage of staff PPE, and extensive cleaning of exam room while observing appropriate contact time as indicated for disinfecting solutions.   HPI Pt presents with recurrent pain in right side  Wt Readings from Last 3 Encounters:  10/18/20 140 lb 4 oz (63.6 kg)  08/05/20 147 lb 9.6 oz (67 kg)  07/08/20 156 lb 6.4 oz (70.9 kg)   Lost wt with diet and exercise  Very pleased  Does exercise classes  24.07 kg/m   This is acute on chronic  Went away for a short time and then worse again the past several months Had been going to the gym recently (? Aggravate more)  No difference from before   Right side  Upper abdomen and side  Feels a knot  No spasms, pain is constant  Sharp and dull  Not burning  No rash  No n/v  No urinary symptoms    Neg w/u for GI or urinary cause in the past   GI thought it was MSK in origin  She was ref to pain clinic   There was a discussion re: possible irritation of nerves from gyn surgery Suggested a nerve block :as noted here  We discussed a diagnostic right-sided ultrasound-guided tap block, transverse abdominis plane block, to see if it helps with her pain. The goal of the TAP block is to inject local anesthetic in the plane between the internal oblique and transversus abdominis muscles. This will interrupt innervation to the abdominal skin, muscles, and parietal peritoneum; however, it will not block visceral pain.    She has had ccy  Also lithotripsy and cystoscopy  Also abd hysterectomy  MRI, EGD CT scan   Lab Results  Component Value Date   CREATININE 0.89 06/03/2020   BUN 12 06/03/2020   NA 141 06/03/2020   K 4.5 06/03/2020   CL 104 06/03/2020   CO2 29 06/03/2020    Lab Results  Component Value Date   ALT 37 (H) 06/03/2020   AST 39 (H) 06/03/2020   ALKPHOS 111 06/03/2020   BILITOT 0.8 06/03/2020   Lab Results  Component Value Date   WBC 5.9 06/03/2020   HGB 14.9 06/03/2020   HCT 44.1 06/03/2020   MCV 91.0 06/03/2020   PLT 276.0 06/03/2020   Patient Active Problem List   Diagnosis Date Noted   DOE (dyspnea on exertion) 07/09/2020   Allergic rhinitis 12/25/2019   Vaccine reaction 07/26/2019   Perimenopause 06/08/2018   Low HDL (under 40) 06/08/2018   [place] intestinal bacterial overgrowth 02/09/2018   Cyst of right kidney 06/04/2017   RUQ abdominal pain 05/31/2017   Elevated glucose level 05/22/2017   S/P laparoscopic hysterectomy 01/14/2017   Abdominal pain 07/27/2016   Daily headache 06/01/2016   Chronic cough 06/01/2016   Encounter for routine gynecological examination 05/23/2014   Burning sensation of mouth 09/12/2013   Elevated transaminase level 01/13/2011   Routine general medical examination at a health care facility 01/05/2011   KIDNEY STONE 03/27/2010   Atypical chest pain 07/18/2008   STRESS REACTION, ACUTE, WITH EMOTIONAL DISTURBANCE 09/13/2007   HEADACHE 09/13/2007   Past Medical History:  Diagnosis Date   Anemia    Chronic right upper quadrant pain    Dysplastic  nevus 09/16/2015   left infra axillary   Dysplastic nevus 10/27/2017   left side   History of kidney stones    Hyperemesis gravidarum 1999   [place] intestinal bacterial overgrowth 02/09/2018   Past Surgical History:  Procedure Laterality Date   ABDOMINAL HYSTERECTOMY     CHOLECYSTECTOMY  06/1994   COLONOSCOPY WITH PROPOFOL N/A 07/12/2017   Procedure: COLONOSCOPY WITH PROPOFOL;  Surgeon: Lin Landsman, MD;  Location: [facility] ENDOSCOPY;  Service: Gastroenterology;  Laterality: N/A;   CYSTOSCOPY N/A 01/14/2017   Procedure: CYSTOSCOPY;  Surgeon: Malachy Mood, MD;  Location: [facility] ORS;  Service: Gynecology;  Laterality: N/A;    ESOPHAGOGASTRODUODENOSCOPY (EGD) WITH PROPOFOL N/A 07/12/2017   Procedure: ESOPHAGOGASTRODUODENOSCOPY (EGD) WITH PROPOFOL;  Surgeon: Lin Landsman, MD;  Location: [facility] ENDOSCOPY;  Service: Gastroenterology;  Laterality: N/A;   KIDNEY STONE SURGERY     LITHOTRIPSY  03/2006   TONSILECTOMY, ADENOIDECTOMY, BILATERAL MYRINGOTOMY AND TUBES     myringotomy tubes   TOTAL LAPAROSCOPIC HYSTERECTOMY WITH SALPINGECTOMY Bilateral 01/14/2017   Procedure: HYSTERECTOMY TOTAL LAPAROSCOPIC WITH SALPINGECTOMY;  Surgeon: Malachy Mood, MD;  Location: [facility] ORS;  Service: Gynecology;  Laterality: Bilateral;   Social History   Tobacco Use   Smoking status: Never   Smokeless tobacco: Never  Vaping Use   Vaping Use: Never used  Substance Use Topics   Alcohol use: No    Alcohol/week: 0.0 standard drinks   Drug use: No   Family History  Problem Relation Age of Onset   Migraines Mother    Breast cancer Paternal Aunt        Contact   Cancer Maternal Grandmother 20       brain tumor/breast cancer   Breast cancer Maternal Grandmother    Heart disease Maternal Grandfather        heart problems   Colon cancer Neg Hx    Esophageal cancer Neg Hx    Liver disease Neg Hx    Pancreatic cancer Neg Hx    Stomach cancer Neg Hx    Inflammatory bowel disease Neg Hx    Allergies  Allergen Reactions   Amoxicillin Anaphylaxis   Bee Venom Anaphylaxis   Sulfa Antibiotics Anaphylaxis   Sulfonamide Derivatives Anaphylaxis    Lungs close up   Penicillins Rash    Has patient had a PCN reaction causing immediate rash, facial/tongue/throat swelling, SOB or lightheadedness with hypotension: No Has patient had a PCN reaction causing severe rash involving mucus membranes or skin necrosis: No Has patient had a PCN reaction that required hospitalization: No Has patient had a PCN reaction occurring within the last 10 years: No If all of the above answers are "NO", then may proceed with Cephalosporin use.    Current  Outpatient Medications on File Prior to Visit  Medication Sig Dispense Refill   pantoprazole (PROTONIX) 40 MG tablet TAKE 1 TABLET (40 MG TOTAL) BY MOUTH DAILY. TAKE 30-60 MIN BEFORE FIRST MEAL OF THE DAY. 90 tablet 0   No current facility-administered medications on file prior to visit.     Review of Systems  Constitutional:  Negative for activity change, appetite change, fatigue, fever and unexpected weight change.  HENT:  Negative for congestion, ear pain, rhinorrhea, sinus pressure and sore throat.   Eyes:  Negative for pain, redness and visual disturbance.  Respiratory:  Positive for cough. Negative for shortness of breath and wheezing.        Chronic cough  Cardiovascular:  Negative for chest pain and palpitations.  Gastrointestinal:  Positive for abdominal pain. Negative for abdominal distention, anal bleeding, blood in stool, constipation, diarrhea, nausea, rectal pain and vomiting.  Endocrine: Negative for polydipsia and polyuria.  Genitourinary:  Negative for dysuria, frequency and urgency.  Musculoskeletal:  Negative for arthralgias, back pain and myalgias.  Skin:  Negative for pallor and rash.  Allergic/Immunologic: Negative for environmental allergies.  Neurological:  Negative for dizziness, syncope and headaches.  Hematological:  Negative for adenopathy. Does not bruise/bleed easily.  Psychiatric/Behavioral:  Negative for decreased concentration and dysphoric mood. The patient is not nervous/anxious.        Stressors      Objective:   Physical Exam Constitutional:      General: She is not in acute distress.    Appearance: Normal appearance. She is well-developed and normal weight. She is not ill-appearing.  HENT:     Head: Normocephalic and atraumatic.  Eyes:     General: No scleral icterus.    Conjunctiva/sclera: Conjunctivae normal.     Pupils: Pupils are equal, round, and reactive to light.  Cardiovascular:     Rate and Rhythm: Normal rate and regular rhythm.      Heart sounds: Normal heart sounds.  Pulmonary:     Effort: Pulmonary effort is normal. No respiratory distress.     Breath sounds: Normal breath sounds. No wheezing or rales.  Abdominal:     General: Abdomen is flat. Bowel sounds are normal. There is no distension.     Palpations: Abdomen is soft. There is no fluid wave, hepatomegaly, splenomegaly, mass or pulsatile mass.     Tenderness: There is abdominal tenderness in the right upper quadrant. There is right CVA tenderness. There is no left CVA tenderness, guarding or rebound. Negative signs include Murphy's sign and McBurney's sign.     Hernia: No hernia is present.     Comments: Mild to moderate tenderness RUQ w/o mass or rebound or guarding  Some R flank area tenderness (more lateral than posterior)  Musculoskeletal:     Cervical back: Normal range of motion and neck supple.  Lymphadenopathy:     Cervical: No cervical adenopathy.  Skin:    General: Skin is warm and dry.     Coloration: Skin is not jaundiced or pale.     Findings: No erythema or rash.  Neurological:     Mental Status: She is alert.     Sensory: No sensory deficit.     Coordination: Coordination normal.     Deep Tendon Reflexes: Reflexes normal.  Psychiatric:        Mood and Affect: Mood normal.          Assessment & Plan:   Problem List Items Addressed This Visit       Other   Elevated transaminase level    In setting of acute on chronic R upper abd and flank pain  Labs today  Intentional wt loss noted        Relevant Orders   Hepatic function panel (Completed)   RUQ abdominal pain - Primary    Acute on chronic  Past extensive w/u (GI and urol) with imaging and endoscopy  Reviewed last notes from pain clinic and US guided tap block was suggested (? Irritation of nerves from gyn surgery are poss cause)  Pt is open to that  Pending labs from today will refer back       Relevant Orders   Hepatic function panel (Completed)

## 2020-10-18 NOTE — Patient Instructions (Addendum)
Get voltaren gel over the counter  Apply to affected area up to 4 times daily  This may help some of the pain  Heat or ice if it helps Watch for rash or any other symptoms   If labs look ok - would consider the nerve block that the pain clinic recommended   We will get back to you   If symptoms suddenly worsen let us know (if severe go to the ER)

## 2020-10-19 LAB — HEPATIC FUNCTION PANEL
AG Ratio: 1.9 (calc) (ref 1.0–2.5)
ALT: 13 U/L (ref 6–29)
AST: 20 U/L (ref 10–35)
Albumin: 4.1 g/dL (ref 3.6–5.1)
Alkaline phosphatase (APISO): 68 U/L (ref 31–125)
Bilirubin, Direct: 0.3 mg/dL — ABNORMAL HIGH (ref 0.0–0.2)
Globulin: 2.2 g/dL (calc) (ref 1.9–3.7)
Indirect Bilirubin: 0.9 mg/dL (calc) (ref 0.2–1.2)
Total Bilirubin: 1.2 mg/dL (ref 0.2–1.2)
Total Protein: 6.3 g/dL (ref 6.1–8.1)

## 2020-10-19 NOTE — Assessment & Plan Note (Signed)
In setting of acute on chronic R upper abd and flank pain  Labs today  Intentional wt loss noted

## 2020-10-19 NOTE — Assessment & Plan Note (Signed)
Acute on chronic  Past extensive w/u (GI and urol) with imaging and endoscopy  Reviewed last notes from pain clinic and US guided tap block was suggested (? Irritation of nerves from gyn surgery are poss cause)  Pt is open to that  Pending labs from today will refer back

## 2020-11-04 ENCOUNTER — Ambulatory Visit: Payer: BC Managed Care – PPO | Admitting: Internal Medicine

## 2020-11-06 ENCOUNTER — Ambulatory Visit (INDEPENDENT_AMBULATORY_CARE_PROVIDER_SITE_OTHER): Payer: BC Managed Care – PPO | Admitting: Dermatology

## 2020-11-06 ENCOUNTER — Other Ambulatory Visit: Payer: Self-pay

## 2020-11-06 DIAGNOSIS — Z1283 Encounter for screening for malignant neoplasm of skin: Secondary | ICD-10-CM | POA: Diagnosis not present

## 2020-11-06 DIAGNOSIS — L719 Rosacea, unspecified: Secondary | ICD-10-CM | POA: Diagnosis not present

## 2020-11-06 DIAGNOSIS — L821 Other seborrheic keratosis: Secondary | ICD-10-CM

## 2020-11-06 DIAGNOSIS — L578 Other skin changes due to chronic exposure to nonionizing radiation: Secondary | ICD-10-CM | POA: Diagnosis not present

## 2020-11-06 DIAGNOSIS — D229 Melanocytic nevi, unspecified: Secondary | ICD-10-CM

## 2020-11-06 DIAGNOSIS — Z86018 Personal history of other benign neoplasm: Secondary | ICD-10-CM

## 2020-11-06 DIAGNOSIS — L814 Other melanin hyperpigmentation: Secondary | ICD-10-CM

## 2020-11-06 DIAGNOSIS — D1800 Hemangioma unspecified site: Secondary | ICD-10-CM

## 2020-11-06 NOTE — Patient Instructions (Addendum)
Instructions for [facility] Medications  One or more of your medications was sent to the [facility] mail order compounding pharmacy. You will receive an email from them and can purchase the medicine through that link. It will then be mailed to your home at the address you confirmed. If for any reason you do not receive an email from them, please check your spam folder. If you still do not find the email, please let us know. [facility] phone number is [phone].   If you have any questions or concerns for your doctor, please call our main line at [phone] and press option 4 to reach your doctor's medical assistant. If no one answers, please leave a voicemail as directed and we will return your call as soon as possible. Messages left after 4 pm will be answered the following business day.   You may also send us a message via MyChart. We typically respond to MyChart messages within 1-2 business days.  For prescription refills, please ask your pharmacy to contact our office. Our fax number is [phone].  If you have an urgent issue when the clinic is closed that cannot wait until the next business day, you can page your doctor at the number below.    Please note that while we do our best to be available for urgent issues outside of office hours, we are not available 24/7.   If you have an urgent issue and are unable to reach us, you may choose to seek medical care at your doctor's office, retail clinic, urgent care center, or emergency room.  If you have a medical emergency, please immediately call 911 or go to the emergency department.  Pager Numbers  - Dr. Kowalski: [phone]  - Dr. Moye: [phone]  - Dr. Stewart: [phone]  In the event of inclement weather, please call our main line at [phone] for an update on the status of any delays or closures.  Dermatology Medication Tips: Please keep the boxes that topical medications come in in order to help  keep track of the instructions about where and how to use these. Pharmacies typically print the medication instructions only on the boxes and not directly on the medication tubes.   If your medication is too expensive, please contact our office at [phone] option 4 or send us a message through MyChart.   We are unable to tell what your co-pay for medications will be in advance as this is different depending on your insurance coverage. However, we may be able to find a substitute medication at lower cost or fill out paperwork to get insurance to cover a needed medication.   If a prior authorization is required to get your medication covered by your insurance company, please allow us 1-2 business days to complete this process.  Drug prices often vary depending on where the prescription is filled and some pharmacies may offer cheaper prices.  The website www.goodrx.com contains coupons for medications through different pharmacies. The prices here do not account for what the cost may be with help from insurance (it may be cheaper with your insurance), but the website can give you the price if you did not use any insurance.  - You can print the associated coupon and take it with your prescription to the pharmacy.  - You may also stop by our office during regular business hours and pick up a GoodRx coupon card.  - If you need your prescription sent electronically to a different pharmacy, notify our office   through [facility] MyChart or by phone at [phone] option 4.  

## 2020-11-06 NOTE — Progress Notes (Signed)
   Follow-Up Visit   Subjective  Courtney Pineda is a 50 y.o. female who presents for the following: Annual Exam (History of dysplastic nevi - TBSE today). The patient presents for Total-Body Skin Exam (TBSE) for skin cancer screening and mole check.  The following portions of the chart were reviewed this encounter and updated as appropriate:   Tobacco  Allergies  Meds  Problems  Med Hx  Surg Hx  Fam Hx     Review of Systems:  No other skin or systemic complaints except as noted in HPI or Assessment and Plan.  Objective  Well appearing patient in no apparent distress; mood and affect are within normal limits.  A full examination was performed including scalp, head, eyes, ears, nose, lips, neck, chest, axillae, abdomen, back, buttocks, bilateral upper extremities, bilateral lower extremities, hands, feet, fingers, toes, fingernails, and toenails. All findings within normal limits unless otherwise noted below.  Head - Anterior (Face) Pinkness and pink macules   Assessment & Plan   History of Dysplastic Nevi - No evidence of recurrence today - Recommend regular full body skin exams - Recommend daily broad spectrum sunscreen SPF 30+ to sun-exposed areas, reapply every 2 hours as needed.  - Call if any new or changing lesions are noted between office visits  Lentigines - Scattered tan macules - Due to sun exposure - Benign-appering, observe - Recommend daily broad spectrum sunscreen SPF 30+ to sun-exposed areas, reapply every 2 hours as needed. - Call for any changes  Seborrheic Keratoses - Stuck-on, waxy, tan-brown papules and/or plaques  - Benign-appearing - Discussed benign etiology and prognosis. - Observe - Call for any changes  Melanocytic Nevi - Tan-brown and/or pink-flesh-colored symmetric macules and papules - Benign appearing on exam today - Observation - Call clinic for new or changing moles - Recommend daily use of broad spectrum spf 30+ sunscreen  to sun-exposed areas.   Hemangiomas - Red papules - Discussed benign nature - Observe - Call for any changes  Actinic Damage - Chronic condition, secondary to cumulative UV/sun exposure - diffuse scaly erythematous macules with underlying dyspigmentation - Recommend daily broad spectrum sunscreen SPF 30+ to sun-exposed areas, reapply every 2 hours as needed.  - Staying in the shade or wearing long sleeves, sun glasses (UVA+UVB protection) and wide brim hats (4-inch brim around the entire circumference of the hat) are also recommended for sun protection.  - Call for new or changing lesions.  Skin cancer screening performed today.  Rosacea Head - Anterior (Face)  Rosacea is a chronic progressive skin condition usually affecting the face of adults, causing redness and/or acne bumps. It is treatable but not curable. It sometimes affects the eyes (ocular rosacea) as well. It may respond to topical and/or systemic medication and can flare with stress, sun exposure, alcohol, exercise and some foods.  Daily application of broad spectrum spf 30+ sunscreen to face is recommended to reduce flares.  Restart [facility] metronidazole/ivermectin/azelaic acid twice daily as needed to affected areas on the face.   Skin cancer screening  Return in about 1 year (around 11/06/2021) for TBSE.  I, Ashok Cordia, CMA, am acting as scribe for Sarina Ser, MD .  Documentation: I have reviewed the above documentation for accuracy and completeness, and I agree with the above.  Sarina Ser, MD

## 2020-11-07 ENCOUNTER — Encounter: Payer: Self-pay | Admitting: Dermatology

## 2020-11-11 ENCOUNTER — Encounter: Payer: Self-pay | Admitting: Family Medicine

## 2020-11-11 ENCOUNTER — Ambulatory Visit: Payer: BC Managed Care – PPO | Admitting: Internal Medicine

## 2020-11-11 NOTE — Telephone Encounter (Signed)
Spoke with pt and virtual visit scheduled with Dr. Einar Pheasant tomorrow at 12:20pm

## 2020-11-12 ENCOUNTER — Encounter: Payer: Self-pay | Admitting: Family Medicine

## 2020-11-12 ENCOUNTER — Telehealth (INDEPENDENT_AMBULATORY_CARE_PROVIDER_SITE_OTHER): Payer: BC Managed Care – PPO | Admitting: Family Medicine

## 2020-11-12 VITALS — BP 118/88 | HR 67 | Temp 99.0°F | Ht 64.0 in | Wt 136.0 lb

## 2020-11-12 DIAGNOSIS — U071 COVID-19: Secondary | ICD-10-CM | POA: Diagnosis not present

## 2020-11-12 DIAGNOSIS — J4520 Mild intermittent asthma, uncomplicated: Secondary | ICD-10-CM

## 2020-11-12 DIAGNOSIS — J45909 Unspecified asthma, uncomplicated: Secondary | ICD-10-CM | POA: Insufficient documentation

## 2020-11-12 MED ORDER — NIRMATRELVIR/RITONAVIR (PAXLOVID)TABLET
3.0000 | ORAL_TABLET | Freq: Two times a day (BID) | ORAL | 0 refills | Status: AC
Start: 2020-11-12 — End: 2020-11-17

## 2020-11-12 MED ORDER — ONDANSETRON HCL 4 MG PO TABS: 4.0000 mg | ORAL_TABLET | Freq: Three times a day (TID) | ORAL | 0 refills | Status: DC | PRN

## 2020-11-12 NOTE — Progress Notes (Signed)
Virtual Visit via Telephone Note  I connected with Courtney Pineda on 11/12/20 at 12:20 PM EDT by telephone and verified that I am speaking with the correct person using two identifiers.   I discussed the limitations, risks, security and privacy concerns of performing an evaluation and management service by telephone and the availability of in person appointments. I also discussed with the patient that there may be a patient responsible charge related to this service. The patient expressed understanding and agreed to proceed.  Patient location: Home Provider Location: [place] Participants: Lesleigh Noe and Lonia Farber Chandler   History of Present Illness: Chief Complaint  Patient presents with   Headache   Fatigue   Covid Exposure   Covid Positive   Nausea   Emesis    Headache  Associated symptoms include coughing, a fever, nausea and vomiting.  Emesis  Associated symptoms include chills, coughing, a fever and headaches. Pertinent negatives include no chest pain, diarrhea or myalgias.   #Covid positive - 11/10/2020 - started with a pounding HA  - thought it was due to sleep - but son started getting sick last weekend and he was positive  - has been caring for him - has not be able to work (works from home) - husband left Friday and came back Saturday and was also sick - temperature 100.9  Getting worked up for chronic cough - possible asthma  Review of Systems  Constitutional:  Positive for chills and fever.  HENT:  Positive for congestion.   Respiratory:  Positive for cough. Negative for shortness of breath and wheezing.   Cardiovascular:  Negative for chest pain.  Gastrointestinal:  Positive for nausea and vomiting. Negative for diarrhea.  Musculoskeletal:  Negative for myalgias.  Neurological:  Positive for headaches.     Observations/Objective: BP 118/88   Pulse 67   Temp 99 F (37.2 C) (Oral)   Ht '5\' 4"'$  (1.626 m)   Wt 136 lb (61.7 kg)    LMP 01/08/2017   BMI 23.34 kg/m   Phone visit:  Patient speaking in complete sentences No distress Alert and oriented Normal mood  Assessment and Plan: Problem List Items Addressed This Visit       Respiratory   Asthma   Other Visit Diagnoses     COVID-19 virus infection    -  Primary   Relevant Medications   nirmatrelvir/ritonavir EUA (PAXLOVID) TABS   ondansetron (ZOFRAN) 4 MG tablet      Recently diagnosed with asthma which places her in higher risk for severe covid.   Discussed paxlovid and medication prescribed ER precautions  Isolation precautions  Zofran for N/V  Follow Up Instructions:  Return if symptoms worsen or fail to improve.   I discussed the assessment and treatment plan with the patient. The patient was provided an opportunity to ask questions and all were answered. The patient agreed with the plan and demonstrated an understanding of the instructions.   The patient was advised to call back or seek an in-person evaluation if the symptoms worsen or if the condition fails to improve as anticipated.  I provided 12 minutes of non-face-to-face time during this encounter.   Lesleigh Noe, MD

## 2020-11-13 ENCOUNTER — Encounter: Payer: Self-pay | Admitting: Family Medicine

## 2020-11-13 DIAGNOSIS — J019 Acute sinusitis, unspecified: Secondary | ICD-10-CM | POA: Diagnosis not present

## 2020-11-13 DIAGNOSIS — J209 Acute bronchitis, unspecified: Secondary | ICD-10-CM | POA: Diagnosis not present

## 2020-11-13 DIAGNOSIS — B9689 Other specified bacterial agents as the cause of diseases classified elsewhere: Secondary | ICD-10-CM | POA: Diagnosis not present

## 2020-11-13 DIAGNOSIS — U071 COVID-19: Secondary | ICD-10-CM | POA: Diagnosis not present

## 2020-11-13 NOTE — Telephone Encounter (Signed)
I spoke with pt; pt started with CP on lt side of chest that was a sharpe initial pain which turned into dull continuous pain in lt side of chest. When pt coughs makes the lt sided CP hurts worse. This morning pt said the CP has worsened to pain level 5-6. Pt vomited several times last night and zofran did not help. Last vomited around 11 PM last night; pt has not eaten or drank this morning. Pt has dry mouth, rt side of upper abd  is hurting on and off but this is not a new symptoms. No lightheadedness. Pt said is not in distress with breathing but concerned about chest pain. Pt will go to [facility] ED via husband taking by car. Sending note to Dr Glori Bickers as PCP and Dr Einar Pheasant.

## 2020-11-13 NOTE — Telephone Encounter (Signed)
Appreciate Triage support and agree with plan for in-person evaluation for CP and persistent vomiting

## 2020-11-15 NOTE — Telephone Encounter (Signed)
Called pt to check on her she went to [facility] in Clinic instead of the ER. They gave her a 5 day course of prednisone, z-pak, and Tessalon. She said she is feeling better but still sick she is out of work this week so she is resting and drinking fluids. Dr. Einar Pheasant gave her zofran and that has been helping her and she has been able to eat and drink and "keep it down". Pt had no SOB or trouble breathing. She said the provider listened to her lungs and said they sound good so they didn't think she needed a chest xray. Pt advise if she doesn't continue to improve or if she develops any new sxs to let us know. Pt thanked PCP for checking on her and said for now she is doing ok but will keep [place] posted

## 2020-12-10 ENCOUNTER — Ambulatory Visit: Payer: BC Managed Care – PPO | Admitting: Primary Care

## 2020-12-31 ENCOUNTER — Ambulatory Visit
Payer: BC Managed Care – PPO | Attending: Student in an Organized Health Care Education/Training Program | Admitting: Student in an Organized Health Care Education/Training Program

## 2020-12-31 ENCOUNTER — Other Ambulatory Visit: Payer: Self-pay

## 2020-12-31 ENCOUNTER — Encounter: Payer: Self-pay | Admitting: Student in an Organized Health Care Education/Training Program

## 2020-12-31 VITALS — BP 113/77 | HR 68 | Temp 97.2°F | Resp 16 | Ht 64.0 in | Wt 134.3 lb

## 2020-12-31 DIAGNOSIS — R1011 Right upper quadrant pain: Secondary | ICD-10-CM | POA: Diagnosis not present

## 2020-12-31 DIAGNOSIS — J309 Allergic rhinitis, unspecified: Secondary | ICD-10-CM | POA: Diagnosis not present

## 2020-12-31 DIAGNOSIS — G894 Chronic pain syndrome: Secondary | ICD-10-CM | POA: Diagnosis not present

## 2020-12-31 DIAGNOSIS — J301 Allergic rhinitis due to pollen: Secondary | ICD-10-CM | POA: Diagnosis not present

## 2020-12-31 DIAGNOSIS — J3089 Other allergic rhinitis: Secondary | ICD-10-CM | POA: Diagnosis not present

## 2020-12-31 DIAGNOSIS — R059 Cough, unspecified: Secondary | ICD-10-CM | POA: Diagnosis not present

## 2020-12-31 NOTE — Patient Instructions (Signed)
GENERAL RISKS AND COMPLICATIONS ° °What are the risk, side effects and possible complications? °Generally speaking, most procedures are safe.  However, with any procedure there are risks, side effects, and the possibility of complications.  The risks and complications are dependent upon the sites that are lesioned, or the type of nerve block to be performed.  The closer the procedure is to the spine, the more serious the risks are.  Great care is taken when placing the radio frequency needles, block needles or lesioning probes, but sometimes complications can occur. °Infection: Any time there is an injection through the skin, there is a risk of infection.  This is why sterile conditions are used for these blocks.  There are four possible types of infection. °Localized skin infection. °Central Nervous System Infection-This can be in the form of Meningitis, which can be deadly. °Epidural Infections-This can be in the form of an epidural abscess, which can cause pressure inside of the spine, causing compression of the spinal cord with subsequent paralysis. This would require an emergency surgery to decompress, and there are no guarantees that the patient would recover from the paralysis. °Discitis-This is an infection of the intervertebral discs.  It occurs in about 1% of discography procedures.  It is difficult to treat and it may lead to surgery. ° °      2. Pain: the needles have to go through skin and soft tissues, will cause soreness. °      3. Damage to internal structures:  The nerves to be lesioned may be near blood vessels or   ° other nerves which can be potentially damaged. °      4. Bleeding: Bleeding is more common if the patient is taking blood thinners such as  aspirin, Coumadin, Ticiid, Plavix, etc., or if he/she have some genetic predisposition  such as hemophilia. Bleeding into the spinal canal can cause compression of the spinal  cord with subsequent paralysis.  This would require an emergency  surgery to  decompress and there are no guarantees that the patient would recover from the  paralysis. °      5. Pneumothorax:  Puncturing of a lung is a possibility, every time a needle is introduced in  the area of the chest or upper back.  Pneumothorax refers to free air around the  collapsed lung(s), inside of the thoracic cavity (chest cavity).  Another two possible  complications related to a similar event would include: Hemothorax and Chylothorax.   These are variations of the Pneumothorax, where instead of air around the collapsed  lung(s), you may have blood or chyle, respectively. °      6. Spinal headaches: They may occur with any procedures in the area of the spine. °      7. Persistent CSF (Cerebro-Spinal Fluid) leakage: This is a rare problem, but may occur  with prolonged intrathecal or epidural catheters either due to the formation of a fistulous  track or a dural tear. °      8. Nerve damage: By working so close to the spinal cord, there is always a possibility of  nerve damage, which could be as serious as a permanent spinal cord injury with  paralysis. °      9. Death:  Although rare, severe deadly allergic reactions known as "Anaphylactic  reaction" can occur to any of the medications used. °     10. Worsening of the symptoms:  We can always make thing worse. ° °What are the chances   of something like this happening? °Chances of any of this occuring are extremely low.  By statistics, you have more of a chance of getting killed in a motor vehicle accident: while driving to the hospital than any of the above occurring .  Nevertheless, you should be aware that they are possibilities.  In general, it is similar to taking a shower.  Everybody knows that you can slip, hit your head and get killed.  Does that mean that you should not shower again?  Nevertheless always keep in mind that statistics do not mean anything if you happen to be on the wrong side of them.  Even if a procedure has a 1 (one) in a  1,000,000 (million) chance of going wrong, it you happen to be that one..Also, keep in mind that by statistics, you have more of a chance of having something go wrong when taking medications. ° °Who should not have this procedure? °If you are on a blood thinning medication (e.g. Coumadin, Plavix, see list of "Blood Thinners"), or if you have an active infection going on, you should not have the procedure.  If you are taking any blood thinners, please inform your physician. ° °How should I prepare for this procedure? °Do not eat or drink anything at least six hours prior to the procedure. °Bring a driver with you .  It cannot be a taxi. °Come accompanied by an adult that can drive you back, and that is strong enough to help you if your legs get weak or numb from the local anesthetic. °Take all of your medicines the morning of the procedure with just enough water to swallow them. °If you have diabetes, make sure that you are scheduled to have your procedure done first thing in the morning, whenever possible. °If you have diabetes, take only half of your insulin dose and notify our nurse that you have done so as soon as you arrive at the clinic. °If you are diabetic, but only take blood sugar pills (oral hypoglycemic), then do not take them on the morning of your procedure.  You may take them after you have had the procedure. °Do not take aspirin or any aspirin-containing medications, at least eleven (11) days prior to the procedure.  They may prolong bleeding. °Wear loose fitting clothing that may be easy to take off and that you would not mind if it got stained with Betadine or blood. °Do not wear any jewelry or perfume °Remove any nail coloring.  It will interfere with some of our monitoring equipment. ° °NOTE: Remember that this is not meant to be interpreted as a complete list of all possible complications.  Unforeseen problems may occur. ° °BLOOD THINNERS °The following drugs contain aspirin or other products,  which can cause increased bleeding during surgery and should not be taken for 2 weeks prior to and 1 week after surgery.  If you should need take something for relief of minor pain, you may take acetaminophen which is found in Tylenol,m Datril, Anacin-3 and Panadol. It is not blood thinner. The products listed below are.  Do not take any of the products listed below in addition to any listed on your instruction sheet. ° °A.P.C or A.P.C with Codeine Codeine Phosphate Capsules #3 Ibuprofen Ridaura  °ABC compound Congesprin Imuran rimadil  °Advil Cope Indocin Robaxisal  °Alka-Seltzer Effervescent Pain Reliever and Antacid Coricidin or Coricidin-D ° Indomethacin Rufen  °Alka-Seltzer plus Cold Medicine Cosprin Ketoprofen S-A-C Tablets  °Anacin Analgesic Tablets or Capsules Coumadin   Korlgesic Salflex  Anacin Extra Strength Analgesic tablets or capsules CP-2 Tablets Lanoril Salicylate  Anaprox Cuprimine Capsules Levenox Salocol  Anexsia-D Dalteparin Magan Salsalate  Anodynos Darvon compound Magnesium Salicylate Sine-off  Ansaid Dasin Capsules Magsal Sodium Salicylate  Anturane Depen Capsules Marnal Soma  APF Arthritis pain formula Dewitt's Pills Measurin Stanback  Argesic Dia-Gesic Meclofenamic Sulfinpyrazone  Arthritis Bayer Timed Release Aspirin Diclofenac Meclomen Sulindac  Arthritis pain formula Anacin Dicumarol Medipren Supac  Analgesic (Safety coated) Arthralgen Diffunasal Mefanamic Suprofen  Arthritis Strength Bufferin Dihydrocodeine Mepro Compound Suprol  Arthropan liquid Dopirydamole Methcarbomol with Aspirin Synalgos  ASA tablets/Enseals Disalcid Micrainin Tagament  Ascriptin Doan's Midol Talwin  Ascriptin A/D Dolene Mobidin Tanderil  Ascriptin Extra Strength Dolobid Moblgesic Ticlid  Ascriptin with Codeine Doloprin or Doloprin with Codeine Momentum Tolectin  Asperbuf Duoprin Mono-gesic Trendar  Aspergum Duradyne Motrin or Motrin IB Triminicin  Aspirin plain, buffered or enteric coated  Durasal Myochrisine Trigesic  Aspirin Suppositories Easprin Nalfon Trillsate  Aspirin with Codeine Ecotrin Regular or Extra Strength Naprosyn Uracel  Atromid-S Efficin Naproxen Ursinus  Auranofin Capsules Elmiron Neocylate Vanquish  Axotal Emagrin Norgesic Verin  Azathioprine Empirin or Empirin with Codeine Normiflo Vitamin E  Azolid Emprazil Nuprin Voltaren  Bayer Aspirin plain, buffered or children's or timed BC Tablets or powders Encaprin Orgaran Warfarin Sodium  Buff-a-Comp Enoxaparin Orudis Zorpin  Buff-a-Comp with Codeine Equegesic Os-Cal-Gesic   Buffaprin Excedrin plain, buffered or Extra Strength Oxalid   Bufferin Arthritis Strength Feldene Oxphenbutazone   Bufferin plain or Extra Strength Feldene Capsules Oxycodone with Aspirin   Bufferin with Codeine Fenoprofen Fenoprofen Pabalate or Pabalate-SF   Buffets II Flogesic Panagesic   Buffinol plain or Extra Strength Florinal or Florinal with Codeine Panwarfarin   Buf-Tabs Flurbiprofen Penicillamine   Butalbital Compound Four-way cold tablets Penicillin   Butazolidin Fragmin Pepto-Bismol   Carbenicillin Geminisyn Percodan   Carna Arthritis Reliever Geopen Persantine   Carprofen Gold's salt Persistin   Chloramphenicol Goody's Phenylbutazone   Chloromycetin Haltrain Piroxlcam   Clmetidine heparin Plaquenil   Cllnoril Hyco-pap Ponstel   Clofibrate Hydroxy chloroquine Propoxyphen         Before stopping any of these medications, be sure to consult the physician who ordered them.  Some, such as Coumadin (Warfarin) are ordered to prevent or treat serious conditions such as "deep thrombosis", "pumonary embolisms", and other heart problems.  The amount of time that you may need off of the medication may also vary with the medication and the reason for which you were taking it.  If you are taking any of these medications, please make sure you notify your pain physician before you undergo any procedures.         Selective Nerve Root  Block Patient Information  Description: Specific nerve roots exit the spinal canal and these nerves can be compressed and inflamed by a bulging disc and bone spurs.  By injecting steroids on the nerve root, we can potentially decrease the inflammation surrounding these nerves, which often leads to decreased pain.  Also, by injecting local anesthesia on the nerve root, this can provide [place] helpful information to give to your referring doctor if it decreases your pain.  Selective nerve root blocks can be done along the spine from the neck to the low back depending on the location of your pain.   After numbing the skin with local anesthesia, a Leavell needle is passed to the nerve root and the position of the needle is verified using x-ray pictures.  After the needle is  in correct position, we then deposit the medication.  You may experience a pressure sensation while this is being done.  The entire block usually lasts less than 15 minutes.  Conditions that may be treated with selective nerve root blocks: Low back and leg pain Spinal stenosis Diagnostic block prior to potential surgery Neck and arm pain Post laminectomy syndrome  Preparation for the injection:  Do not eat any solid food or dairy products within 8 hours of your appointment. You may drink clear liquids up to 3 hours before an appointment.  Clear liquids include water, black coffee, juice or soda.  No milk or cream please. You may take your regular medications, including pain medications, with a sip of water before your appointment.  Diabetics should hold regular insulin (if taken separately) and take 1/2 normal NPH dose the morning of the procedure.  Carry some sugar containing items with you to your appointment. A driver must accompany you and be prepared to drive you home after your procedure. Bring all your current medications with you. An IV may be inserted and sedation may be given at the discretion of the physician. A blood  pressure cuff, EKG, and other monitors will often be applied during the procedure.  Some patients may need to have extra oxygen administered for a short period. You will be asked to provide medical information, including allergies, prior to the procedure.  We must know immediately if you are taking blood  Thinners (like Coumadin) or if you are allergic to IV iodine contrast (dye).  Possible side-effects: All are usually temporary Bleeding from needle site Light headedness Numbness and tingling Decreased blood pressure Weakness in arms/legs Pressure sensation in back/neck Pain at injection site (several days)  Possible complications: All are extremely rare Infection Nerve injury Spinal headache (a headache wore with upright position)  Call if you experience: Fever/chills associated with headache or increased back/neck pain Headache worsened by an upright position New onset weakness or numbness of an extremity below the injection site Hives or difficulty breathing (go to the emergency room) Inflammation or drainage at the injection site(s) Severe back/neck pain greater than usual New symptoms which are concerning to you  Please note:  Although the local anesthetic injected can often make your back or neck feel good for several hours after the injection the pain will likely return.  It takes 3-5 days for steroids to work on the nerve root. You may not notice any pain relief for at least one week.  If effective, we will often do a series of 3 injections spaced 3-6 weeks apart to maximally decrease your pain.    If you have any questions, please call [phone] [facility]

## 2020-12-31 NOTE — Progress Notes (Signed)
Safety precautions to be maintained throughout the outpatient stay will include: orient to surroundings, keep bed in low position, maintain call bell within reach at all times, provide assistance with transfer out of bed and ambulation.  

## 2020-12-31 NOTE — Progress Notes (Signed)
Patient: Courtney Pineda  Service Category: E/M  Provider: Gillis Santa, MD  DOB: 1971-02-25  DOS: 12/31/2020  Referring Provider: Abner Greenspan, MD  MRN: [id number]  Setting: Ambulatory outpatient  PCP: Abner Greenspan, MD  Type: New Patient  Specialty: Interventional Pain Management    Location: Office  Delivery: Face-to-face     Primary Reason(s) for Visit: Encounter for initial evaluation of one or more chronic problems (new to examiner) potentially causing chronic pain, and posing a threat to normal musculoskeletal function. (Level of risk: High) CC: Abdominal Pain (Right, mid)  HPI  [name] is a 50 y.o. year old, female patient, who comes for the first time to our practice referred by Abner Greenspan, MD for our initial evaluation of her chronic pain. She has STRESS REACTION, ACUTE, WITH EMOTIONAL DISTURBANCE; HEADACHE; Atypical chest pain; KIDNEY STONE; Routine general medical examination at a health care facility; Elevated transaminase level; Burning sensation of mouth; Encounter for routine gynecological examination; Daily headache; Chronic cough; Abdominal pain; S/P laparoscopic hysterectomy; Elevated glucose level; RUQ abdominal pain; Cyst of right kidney; [place] intestinal bacterial overgrowth; Perimenopause; Low HDL (under 40); Vaccine reaction; Allergic rhinitis; DOE (dyspnea on exertion); and Asthma on their problem list. Today she comes in for evaluation of her Abdominal Pain (Right, mid)  Pain Assessment: Location: Right, Mid Abdomen Radiating: denies Onset: More than a month ago Duration: Chronic pain Quality: Larence Penning Severity: 2 /10 (subjective, self-reported pain score)  Effect on ADL: none Timing: Intermittent (Pt. states pain may come and then not return for weeks or months - states no exacerbating factors relate to onset) Modifying factors: tylenol BP: 113/77  HR: 68  Onset and Duration: Date of onset: early 2019 Cause of pain: Unknown Severity: Getting  better, Getting worse, NAS-11 at its worse: 8/10, NAS-11 at its best: 0/10, NAS-11 now: 0/10, and NAS-11 on the average: 5/10 Timing: Not influenced by the time of the day Aggravating Factors:  none Alleviating Factors:  none Associated Problems: Sweating Quality of Pain: Aching, Annoying, Intermittent, Cramping, Dull, Nagging, and Sharp Previous Examinations or Tests: CT scan, Endoscopy, MRI scan, and X-rays Previous Treatments: The patient denies na  Patient initially saw me November 2020.  This clinic policy that is a patient does not follow-up within 18 months of their initial visit but they have to be reprocessed as a new patient.  Overall, patient's pain complaint is unchanged from 2020. Courtney Pineda is a pleasant 50 year old female who presents with a chief complaint of right upper quadrant pain.  Of note, patient had a hysterectomy in October 2018.  She notes start of her pain in February 2019 localized to her right upper quadrant.  She also has a history of a distant cholecystectomy in 1996.  Her pain is fairly vague.  Not related to position changes or food intake.  She describes it as burning and occasional throbbing pain along her right subcostal margin.  She does have spasms along this area and does occasionally feel a knot at times.  She feels that massaging this region does help out with her pain.  She has had an extensive GI work-up performed including upper endoscopy, lower endoscopy, colonic biopsy which showed mild colitis.  She was provided with assurance at her last GI visit. She has tried various anti-inflammatory agents including meloxicam, topical diclofenac.  CBC and LFT studies have been normal.  Patient would like to try the right tap block that we had discussed at her last clinic visit.  Meds   Current Outpatient Medications:    acetaminophen (TYLENOL) 500 MG tablet, Take 500 mg by mouth every 6 (six) hours as needed., Disp: , Rfl:    Cholecalciferol (D3-1000 PO), Take by  mouth., Disp: , Rfl:    ondansetron (ZOFRAN) 4 MG tablet, Take 1 tablet (4 mg total) by mouth every 8 (eight) hours as needed for nausea or vomiting. (Patient not taking: Reported on 12/31/2020), Disp: 20 tablet, Rfl: 0   pantoprazole (PROTONIX) 40 MG tablet, TAKE 1 TABLET (40 MG TOTAL) BY MOUTH DAILY. TAKE 30-60 MIN BEFORE FIRST MEAL OF THE DAY. (Patient not taking: Reported on 12/31/2020), Disp: 90 tablet, Rfl: 0  Imaging Review   ROS  Cardiovascular: No reported cardiovascular signs or symptoms such as High blood pressure, coronary artery disease, abnormal heart rate or rhythm, heart attack, blood thinner therapy or heart weakness and/or failure Pulmonary or Respiratory: Wheezing and difficulty taking a deep full breath (Asthma) Neurological: No reported neurological signs or symptoms such as seizures, abnormal skin sensations, urinary and/or fecal incontinence, being born with an abnormal open spine and/or a tethered spinal cord Psychological-Psychiatric: No reported psychological or psychiatric signs or symptoms such as difficulty sleeping, anxiety, depression, delusions or hallucinations (schizophrenial), mood swings (bipolar disorders) or suicidal ideations or attempts Gastrointestinal: Reflux or heatburn Genitourinary: Passing kidney stones Hematological: No reported hematological signs or symptoms such as prolonged bleeding, low or poor functioning platelets, bruising or bleeding easily, hereditary bleeding problems, low energy levels due to low hemoglobin or being anemic Endocrine: No reported endocrine signs or symptoms such as high or low blood sugar, rapid heart rate due to high thyroid levels, obesity or weight gain due to slow thyroid or thyroid disease Rheumatologic: No reported rheumatological signs and symptoms such as fatigue, joint pain, tenderness, swelling, redness, heat, stiffness, decreased range of motion, with or without associated rash Musculoskeletal: Negative for myasthenia  gravis, muscular dystrophy, multiple sclerosis or malignant hyperthermia Work History: Working full time  Allergies  Ms. Ricci is allergic to amoxicillin, bee venom, sulfa antibiotics, sulfonamide derivatives, and penicillins.  Laboratory Chemistry Profile   Renal Lab Results  Component Value Date   BUN 12 06/03/2020   CREATININE 0.89 06/03/2020   GFR 77.34 06/03/2020   GFRAA >60 01/15/2017   GFRNONAA >60 01/15/2017   SPECGRAV >=1.030 (A) 05/31/2017   PHUR 6.0 05/31/2017   PROTEINUR Negative 05/31/2017     Electrolytes Lab Results  Component Value Date   NA 141 06/03/2020   K 4.5 06/03/2020   CL 104 06/03/2020   CALCIUM 9.8 06/03/2020     Hepatic Lab Results  Component Value Date   AST 20 10/18/2020   ALT 13 10/18/2020   ALBUMIN 4.2 06/03/2020   ALKPHOS 111 06/03/2020   AMYLASE 59 05/31/2017   LIPASE 48.0 05/31/2017     ID Lab Results  Component Value Date   [place] NEGATIVE 09/18/2020   PREGTESTUR NEGATIVE 01/14/2017     Bone No results found for: VD25OH, AX094MH6KGS, UP1031RX4, VO5929WK4, 25OHVITD1, 25OHVITD2, 25OHVITD3, TESTOFREE, TESTOSTERONE   Endocrine Lab Results  Component Value Date   GLUCOSE 79 06/03/2020   GLUCOSEU NEGATIVE 08/24/2016   HGBA1C 5.1 06/03/2020   TSH 1.75 06/03/2020     Neuropathy Lab Results  Component Value Date   VITAMINB12 414 11/17/2018   FOLATE 12.2 11/17/2018   HGBA1C 5.1 06/03/2020     CNS No results found for: COLORCSF, APPEARCSF, RBCCOUNTCSF, WBCCSF, POLYSCSF, LYMPHSCSF, EOSCSF, PROTEINCSF, GLUCCSF, JCVIRUS, CSFOLI, IGGCSF, LABACHR, ACETBL, LABACHR, ACETBL  Inflammation (CRP: Acute  ESR: Chronic) Lab Results  Component Value Date   ESRSEDRATE 5 04/18/2018     Rheumatology No results found for: RF, ANA, LABURIC, URICUR, LYMEIGGIGMAB, LYMEABIGMQN, HLAB27   Coagulation Lab Results  Component Value Date   PLT 276.0 06/03/2020     Cardiovascular Lab Results  Component Value Date   HGB 14.9  06/03/2020   HCT 44.1 06/03/2020     Screening Lab Results  Component Value Date   SARSCOV2NAA NEGATIVE 09/18/2020   PREGTESTUR NEGATIVE 01/14/2017     Cancer No results found for: CEA, CA125, LABCA2   Allergens No results found for: ALMOND, APPLE, ASPARAGUS, AVOCADO, BANANA, BARLEY, BASIL, BAYLEAF, GREENBEAN, [place], WHITEBEAN, BEEFIGE, REDBEET, BLUEBERRY, BROCCOLI, CABBAGE, MELON, CARROT, CASEIN, CASHEWNUT, CAULIFLOWER, CELERY     Note: Lab results reviewed.  PFSH  Drug: Ms. Christenberry  reports no history of drug use. Alcohol:  reports no history of alcohol use. Tobacco:  reports that she has never smoked. She has never used smokeless tobacco. Medical:  has a past medical history of Anemia, Chronic right upper quadrant pain, Dysplastic nevus (09/16/2015), Dysplastic nevus (10/27/2017), History of kidney stones, Hyperemesis gravidarum (1999), and [place] intestinal bacterial overgrowth (02/09/2018). Family: family history includes Breast cancer in her maternal grandmother and paternal aunt; Cancer (age of onset: 48) in her maternal grandmother; Heart disease in her maternal grandfather; Migraines in her mother.  Past Surgical History:  Procedure Laterality Date   ABDOMINAL HYSTERECTOMY     CHOLECYSTECTOMY  06/1994   COLONOSCOPY WITH PROPOFOL N/A 07/12/2017   Procedure: COLONOSCOPY WITH PROPOFOL;  Surgeon: Lin Landsman, MD;  Location: [facility] ENDOSCOPY;  Service: Gastroenterology;  Laterality: N/A;   CYSTOSCOPY N/A 01/14/2017   Procedure: CYSTOSCOPY;  Surgeon: Malachy Mood, MD;  Location: [facility] ORS;  Service: Gynecology;  Laterality: N/A;   ESOPHAGOGASTRODUODENOSCOPY (EGD) WITH PROPOFOL N/A 07/12/2017   Procedure: ESOPHAGOGASTRODUODENOSCOPY (EGD) WITH PROPOFOL;  Surgeon: Lin Landsman, MD;  Location: [facility] ENDOSCOPY;  Service: Gastroenterology;  Laterality: N/A;   KIDNEY STONE SURGERY     LITHOTRIPSY  03/2006   TONSILECTOMY, ADENOIDECTOMY, BILATERAL MYRINGOTOMY AND TUBES      myringotomy tubes   TOTAL LAPAROSCOPIC HYSTERECTOMY WITH SALPINGECTOMY Bilateral 01/14/2017   Procedure: HYSTERECTOMY TOTAL LAPAROSCOPIC WITH SALPINGECTOMY;  Surgeon: Malachy Mood, MD;  Location: [facility] ORS;  Service: Gynecology;  Laterality: Bilateral;   Active Ambulatory Problems    Diagnosis Date Noted   STRESS REACTION, ACUTE, WITH EMOTIONAL DISTURBANCE 09/13/2007   HEADACHE 09/13/2007   Atypical chest pain 07/18/2008   KIDNEY STONE 03/27/2010   Routine general medical examination at a health care facility 01/05/2011   Elevated transaminase level 01/13/2011   Burning sensation of mouth 09/12/2013   Encounter for routine gynecological examination 05/23/2014   Daily headache 06/01/2016   Chronic cough 06/01/2016   Abdominal pain 07/27/2016   S/P laparoscopic hysterectomy 01/14/2017   Elevated glucose level 05/22/2017   RUQ abdominal pain 05/31/2017   Cyst of right kidney 06/04/2017   [place] intestinal bacterial overgrowth 02/09/2018   Perimenopause 06/08/2018   Low HDL (under 40) 06/08/2018   Vaccine reaction 07/26/2019   Allergic rhinitis 12/25/2019   DOE (dyspnea on exertion) 07/09/2020   Asthma 11/12/2020   Resolved Ambulatory Problems    Diagnosis Date Noted   NEVUS, ATYPICAL 08/20/2006   Neoplasm of uncertain behavior of skin 09/13/2007   OTITIS EXTERNA 07/18/2008   Acute sinusitis, unspecified 02/05/2007   URI 05/01/2009   BREAST PAIN, LEFT 05/31/2008   VAGINITIS 04/05/2007  MAMMOGRAM, ABNORMAL, LEFT 01/14/2009   Nonspecific abnormal electrocardiogram (ECG) (EKG) 05/31/2008   LOW BACK PAIN, ACUTE 03/27/2010   Left foot pain 67/20/9470   Lice infested hair 96/28/3662   Routine gynecological examination 01/13/2011   Anemia, mild 01/13/2011   Left knee pain 02/19/2011   URI (upper respiratory infection) 05/05/2012   Strep pharyngitis 08/23/2013   Acute pharyngitis 09/08/2013   Thrush, oral 09/08/2013   Dyspareunia 05/23/2014   Irregular menses 94/76/5465    Head lice 03/54/6568   Vaginal discharge 01/15/2016   Pelvic pain in female 01/15/2016   Low back pain 01/15/2016   Right wrist pain 06/01/2016   Heavy menses 06/01/2016   Frequent urination 07/27/2016   Dyspepsia 07/27/2016   Hematuria 05/31/2017   Viral URI with cough 06/10/2017   Abdominal bloating    Colitis 11/16/2017   Otitis externa 11/29/2017   Upper respiratory infection 11/29/2017   Rash and nonspecific skin eruption 08/25/2019   Past Medical History:  Diagnosis Date   Anemia    Chronic right upper quadrant pain    Dysplastic nevus 09/16/2015   Dysplastic nevus 10/27/2017   History of kidney stones    Hyperemesis gravidarum 1999   Constitutional Exam  General appearance: Well nourished, well developed, and well hydrated. In no apparent acute distress Vitals:   12/31/20 1316  BP: 113/77  Pulse: 68  Resp: 16  Temp: (!) 97.2 F (36.2 C)  TempSrc: Temporal  SpO2: 97%  Weight: 134 lb 4.8 oz (60.9 kg)  Height: _0  (1.626 m)   BMI Assessment: Estimated body mass index is 23.05 kg/m as calculated from the following:   Height as of this encounter: _1  (1.626 m).   Weight as of this encounter: 134 lb 4.8 oz (60.9 kg).  BMI interpretation table: BMI level Category Range association with higher incidence of chronic pain  <18 kg/m2 Underweight   18.5-24.9 kg/m2 Ideal body weight   25-29.9 kg/m2 Overweight Increased incidence by 20%  30-34.9 kg/m2 Obese (Class I) Increased incidence by 68%  35-39.9 kg/m2 Severe obesity (Class II) Increased incidence by 136%  >40 kg/m2 Extreme obesity (Class III) Increased incidence by 254%   Patient's current BMI Ideal Body weight  Body mass index is 23.05 kg/m. Ideal body weight: 54.7 kg (120 lb 9.5 oz) Adjusted ideal body weight: 57.2 kg (126 lb 1.2 oz)   BMI Readings from Last 4 Encounters:  12/31/20 23.05 kg/m  11/12/20 23.34 kg/m  10/18/20 24.07 kg/m  08/05/20 25.34 kg/m   Wt Readings from Last 4 Encounters:   12/31/20 134 lb 4.8 oz (60.9 kg)  11/12/20 136 lb (61.7 kg)  10/18/20 140 lb 4 oz (63.6 kg)  08/05/20 147 lb 9.6 oz (67 kg)    Psych/Mental status: Alert, oriented x 3 (person, place, & time)       Eyes: PERLA Respiratory: No evidence of acute respiratory distress  Right upper quadrant pain, tender to palpation, no rebound or guarding  5 out of 5 strength bilateral lower extremity: Plantar flexion, dorsiflexion, knee flexion, knee extension.   Assessment  Primary Diagnosis & Pertinent Problem List: The primary encounter diagnosis was RUQ abdominal pain. A diagnosis of Chronic pain syndrome was also pertinent to this visit.  Visit Diagnosis (New problems to examiner): 1. RUQ abdominal pain   2. Chronic pain syndrome    Plan of Care (Initial workup plan)    We discussed a diagnostic right-sided ultrasound-guided tap block, transverse abdominis plane block, to see if it helps  with her pain. The goal of the TAP block is to inject local anesthetic in the plane between the internal oblique and transversus abdominis muscles. This will interrupt innervation to the abdominal skin, muscles, and parietal peritoneum; however, it will not block visceral pain.    Risks and benefits of this block were reviewed in great detail.  Patient states that she will think about this further and will contact us if she would like to give it a try.    Procedure Orders         ILIONINGUINAL NERVE BLOCK         Provider-requested follow-up: Return in about 2 weeks (around 01/14/2021) for RIGHT TAP block , without sedation ultrasound guidance.  Future Appointments  Date Time Provider [place]  06/10/2021  8:30 AM [facility]  06/17/2021  3:00 PM Tower, Wynelle Fanny, MD [facility]  11/06/2021  3:30 PM Ralene Bathe, MD [facility] None    Note by: Gillis Santa, MD Date: 12/31/2020; Time: 1:44 PM

## 2021-01-17 ENCOUNTER — Ambulatory Visit (INDEPENDENT_AMBULATORY_CARE_PROVIDER_SITE_OTHER): Payer: BC Managed Care – PPO

## 2021-01-17 ENCOUNTER — Other Ambulatory Visit: Payer: Self-pay

## 2021-01-17 DIAGNOSIS — Z23 Encounter for immunization: Secondary | ICD-10-CM

## 2021-01-30 ENCOUNTER — Other Ambulatory Visit: Payer: Self-pay

## 2021-01-30 ENCOUNTER — Encounter: Payer: Self-pay | Admitting: Pulmonary Disease

## 2021-01-30 ENCOUNTER — Ambulatory Visit: Payer: BC Managed Care – PPO | Admitting: Pulmonary Disease

## 2021-01-30 VITALS — BP 118/60 | HR 55 | Temp 97.5°F | Ht 64.0 in | Wt 136.4 lb

## 2021-01-30 DIAGNOSIS — R0602 Shortness of breath: Secondary | ICD-10-CM | POA: Diagnosis not present

## 2021-01-30 DIAGNOSIS — R053 Chronic cough: Secondary | ICD-10-CM | POA: Diagnosis not present

## 2021-01-30 DIAGNOSIS — J45991 Cough variant asthma: Secondary | ICD-10-CM | POA: Diagnosis not present

## 2021-01-30 MED ORDER — FLUTICASONE FUROATE-VILANTEROL 200-25 MCG/ACT IN AEPB: 1.0000 | INHALATION_SPRAY | Freq: Every day | RESPIRATORY_TRACT | 0 refills | Status: DC

## 2021-01-30 NOTE — Patient Instructions (Addendum)
We are giving you a trial of Breo Ellipta, this trial will last 14 days, please let us know how you are doing with it.  If you are doing well we can call it into your pharmacy.  We are scheduling a high-resolution CT of your chest to evaluate [place] changes seen on the chest x-ray previously.  We will see you in follow-up in 4 to 6 weeks time you will see me or the nurse practitioner.

## 2021-01-30 NOTE — Progress Notes (Signed)
Subjective:    Patient ID: Courtney Pineda, female    DOB: 12-Oct-1970, 50 y.o.   MRN: [id number] Chief Complaint  Patient presents with   Second Opinion Re: Cough   HPI Courtney Pineda is a 50 year old lifelong never smoker who presents for second opinion evaluation of a chronic cough.  Has been present for 2 to 3 years but worse over the last year.  Previously evaluated here by Dr. Christinia Gully initially on 19 May 2019 and subsequently by Derl Barrow, NP.  She had pulmonary function testing on 20 September 2020 showing some airways reactivity particularly Mackel airways and reactivity to methacholine.  She had COVID-19 late July/early August and has not noticed worsening of the cough since then.  Her COVID-19 illness was mild and did not require hospitalization.  She does note that her cough is worse in the mornings and upon going outside and being exposed to cold or humid air.  Taking a hot shower also worsens the cough.  Otherwise she does not note any other specific triggers.  No fevers, chills or sweats.  No gastroesophageal reflux symptoms per se.  She took PPI religiously for 1 week without improvement however, this is not an adequate trial for GERD induced cough.  She has not noted whether albuterol helps or not as she has not used this.  Her PFTs did show airways reactivity and she did have significant change with methacholine challenge.  She has a dog as a pet no other exotic pets no birds in the home.  No unusual hobbies.  No significant occupational exposure.    Review of Systems A 10 point review of systems was performed and it is as noted above otherwise negative.  Past Medical History:  Diagnosis Date   Anemia    Chronic right upper quadrant pain    Dysplastic nevus 09/16/2015   left infra axillary   Dysplastic nevus 10/27/2017   left side   History of kidney stones    Hyperemesis gravidarum 1999   [place] intestinal bacterial overgrowth 02/09/2018   Past Surgical History:   Procedure Laterality Date   ABDOMINAL HYSTERECTOMY     CHOLECYSTECTOMY  06/1994   COLONOSCOPY WITH PROPOFOL N/A 07/12/2017   Procedure: COLONOSCOPY WITH PROPOFOL;  Surgeon: Lin Landsman, MD;  Location: [facility] ENDOSCOPY;  Service: Gastroenterology;  Laterality: N/A;   CYSTOSCOPY N/A 01/14/2017   Procedure: CYSTOSCOPY;  Surgeon: Malachy Mood, MD;  Location: [facility] ORS;  Service: Gynecology;  Laterality: N/A;   ESOPHAGOGASTRODUODENOSCOPY (EGD) WITH PROPOFOL N/A 07/12/2017   Procedure: ESOPHAGOGASTRODUODENOSCOPY (EGD) WITH PROPOFOL;  Surgeon: Lin Landsman, MD;  Location: [facility] ENDOSCOPY;  Service: Gastroenterology;  Laterality: N/A;   KIDNEY STONE SURGERY     LITHOTRIPSY  03/2006   TONSILECTOMY, ADENOIDECTOMY, BILATERAL MYRINGOTOMY AND TUBES     myringotomy tubes   TOTAL LAPAROSCOPIC HYSTERECTOMY WITH SALPINGECTOMY Bilateral 01/14/2017   Procedure: HYSTERECTOMY TOTAL LAPAROSCOPIC WITH SALPINGECTOMY;  Surgeon: Malachy Mood, MD;  Location: [facility] ORS;  Service: Gynecology;  Laterality: Bilateral;   Family History  Problem Relation Age of Onset   Migraines Mother    Breast cancer Paternal Aunt        Contact   Cancer Maternal Grandmother 71       brain tumor/breast cancer   Breast cancer Maternal Grandmother    Heart disease Maternal Grandfather        heart problems   Colon cancer Neg Hx    Esophageal cancer Neg Hx    Liver disease Neg Hx  Pancreatic cancer Neg Hx    Stomach cancer Neg Hx    Inflammatory bowel disease Neg Hx    Social History   Tobacco Use   Smoking status: Never   Smokeless tobacco: Never  Substance Use Topics   Alcohol use: No    Alcohol/week: 0.0 standard drinks   Allergies  Allergen Reactions   Amoxicillin Anaphylaxis   Bee Venom Anaphylaxis   Sulfa Antibiotics Anaphylaxis   Sulfonamide Derivatives Anaphylaxis    Lungs close up   Penicillins Rash    Has patient had a PCN reaction causing immediate rash, facial/tongue/throat swelling, SOB  or lightheadedness with hypotension: No Has patient had a PCN reaction causing severe rash involving mucus membranes or skin necrosis: No Has patient had a PCN reaction that required hospitalization: No Has patient had a PCN reaction occurring within the last 10 years: No If all of the above answers are "NO", then may proceed with Cephalosporin use.    Current Meds  Medication Sig   acetaminophen (TYLENOL) 500 MG tablet Take 500 mg by mouth every 6 (six) hours as needed.   Cholecalciferol (D3-1000 PO) Take by mouth.   Immunization History  Administered Date(s) Administered   Influenza Split 01/13/2011, 02/02/2012   Influenza Whole 03/22/2002, 12/27/2009   Influenza,inj,Quad PF,6+ Mos 01/20/2013, 01/26/2014, 01/25/2015, 02/12/2016, 01/27/2017, 01/06/2018, 01/05/2019, 01/17/2020, 01/17/2021   Janssen (J&J) SARS-COV-2 Vaccination 07/19/2019   Td 06/15/2002   Tdap 03/14/2013       Objective:   Physical Exam BP 118/60 (BP Location: Left Arm, Patient Position: Sitting, Cuff Size: Normal)   Pulse (!) 55   Temp (!) 97.5 F (36.4 C) (Oral)   Ht 5\' 4"  (1.626 m)   Wt 136 lb 6.4 oz (61.9 kg)   LMP 01/08/2017   SpO2 97%   BMI 23.41 kg/m  GENERAL: Well nourished, no acute distress. HEAD: Normocephalic, atraumatic.  EYES: Pupils equal, round, reactive to light.  No scleral icterus.  MOUTH: Nose/mouth/throat not examined due to masking requirements for COVID 19. NECK: Supple. No thyromegaly. Trachea midline. No JVD.  No adenopathy. PULMONARY: Good air entry bilaterally.  No adventitious sounds. CARDIOVASCULAR: S1 and S2. Regular rate and rhythm.  No rubs, murmurs or gallops heard. ABDOMEN: Benign. MUSCULOSKELETAL: No joint deformity, no clubbing, no edema.  NEUROLOGIC: No focal deficit, no gait disturbance, speech is fluent. SKIN: Intact,warm,dry. PSYCH: Mood and behavior normal.     Assessment & Plan:     ICD-10-CM   1. Chronic cough  R05.3    Likely secondary to cough variant  asthma as below Cannot exclude early ILD Combined restrictive/obstructive physiology on PFTs     2. SOB (shortness of breath)  R06.02 CT Chest High Resolution   High resolution chest CT as above Prior chest x-ray with mild increase in interstitial prominence    3. Cough variant asthma  J45.991    Trial of Breo Ellipta 200/25, 1 inhalation daily Patient instructed to rinse mouth well after use     Orders Placed This Encounter  Procedures   CT Chest High Resolution    Standing Status:   Future    Standing Expiration Date:   01/30/2022    Order Specific Question:   Is patient pregnant?    Answer:   No    Order Specific Question:   Preferred imaging location?    Answer:   [facility] ordered this encounter  Medications   fluticasone furoate-vilanterol (BREO ELLIPTA) 200-25 MCG/ACT AEPB    Sig:  Inhale 1 puff into the lungs daily.    Dispense:  28 each    Refill:  0    Order Specific Question:   Lot Number?    Answer:   [id number]    Order Specific Question:   Expiration Date?    Answer:   03/12/2021    Order Specific Question:   Quantity    Answer:   1     Suspect patient has cough variant asthma.  I did review her prior films and these show some mild increase in interstitial prominence.  Given that she has had significant chronicity on her symptoms and the changes noted on chest x-ray will proceed with high-resolution chest CT, in particular since she did have a restrictive physiology component on PFTs.  In addition, we will give her a trial of Breo Ellipta as noted above.  She is to contact us if this is effective for her.  We will see her in follow-up in 4 to 6 weeks time she is to contact us prior to that time should any new difficulties arise.     Renold Don, MD Advanced Bronchoscopy P[facility]    *This note was dictated using voice recognition software/Dragon.  Despite best efforts to proofread, errors can occur which can change  the meaning.  Any change was purely unintentional.

## 2021-02-14 ENCOUNTER — Other Ambulatory Visit: Payer: Self-pay

## 2021-02-14 ENCOUNTER — Ambulatory Visit
Admission: RE | Admit: 2021-02-14 | Discharge: 2021-02-14 | Disposition: A | Discharge status: Home / Self Care | Payer: BC Managed Care – PPO | Source: Ambulatory Visit | Attending: Pulmonary Disease | Admitting: Pulmonary Disease

## 2021-02-14 DIAGNOSIS — R0602 Shortness of breath: Secondary | ICD-10-CM | POA: Insufficient documentation

## 2021-02-14 DIAGNOSIS — R059 Cough, unspecified: Secondary | ICD-10-CM | POA: Diagnosis not present

## 2021-03-18 ENCOUNTER — Encounter: Payer: Self-pay | Admitting: Pulmonary Disease

## 2021-03-18 ENCOUNTER — Other Ambulatory Visit
Admission: RE | Admit: 2021-03-18 | Discharge: 2021-03-18 | Disposition: A | Discharge status: Home / Self Care | Payer: BC Managed Care – PPO | Attending: Pulmonary Disease | Admitting: Pulmonary Disease

## 2021-03-18 ENCOUNTER — Ambulatory Visit: Payer: BC Managed Care – PPO | Admitting: Pulmonary Disease

## 2021-03-18 ENCOUNTER — Other Ambulatory Visit: Payer: Self-pay

## 2021-03-18 VITALS — BP 112/64 | HR 60 | Temp 98.1°F | Ht 64.0 in | Wt 136.4 lb

## 2021-03-18 DIAGNOSIS — R053 Chronic cough: Secondary | ICD-10-CM | POA: Insufficient documentation

## 2021-03-18 DIAGNOSIS — R0602 Shortness of breath: Secondary | ICD-10-CM

## 2021-03-18 DIAGNOSIS — J45991 Cough variant asthma: Secondary | ICD-10-CM

## 2021-03-18 LAB — CBC WITH DIFFERENTIAL/PLATELET
Abs Immature Granulocytes: 0.03 10*3/uL (ref 0.00–0.07)
Basophils Absolute: 0.1 10*3/uL (ref 0.0–0.1)
Basophils Relative: 1 %
Eosinophils Absolute: 0.1 10*3/uL (ref 0.0–0.5)
Eosinophils Relative: 2 %
HCT: 46.7 % — ABNORMAL HIGH (ref 36.0–46.0)
Hemoglobin: 15.3 g/dL — ABNORMAL HIGH (ref 12.0–15.0)
Immature Granulocytes: 1 %
Lymphocytes Relative: 32 %
Lymphs Abs: 1.8 10*3/uL (ref 0.7–4.0)
MCH: 30.6 pg (ref 26.0–34.0)
MCHC: 32.8 g/dL (ref 30.0–36.0)
MCV: 93.4 fL (ref 80.0–100.0)
Monocytes Absolute: 0.5 10*3/uL (ref 0.1–1.0)
Monocytes Relative: 8 %
Neutro Abs: 3.3 10*3/uL (ref 1.7–7.7)
Neutrophils Relative %: 56 %
Platelets: 240 10*3/uL (ref 150–400)
RBC: 5 MIL/uL (ref 3.87–5.11)
RDW: 12.3 % (ref 11.5–15.5)
WBC: 5.8 10*3/uL (ref 4.0–10.5)
nRBC: 0 % (ref 0.0–0.2)

## 2021-03-18 MED ORDER — SPIRIVA RESPIMAT 1.25 MCG/ACT IN AERS: 2.0000 | INHALATION_SPRAY | Freq: Every day | RESPIRATORY_TRACT | 0 refills | Status: DC

## 2021-03-18 MED ORDER — BUDESONIDE-FORMOTEROL FUMARATE 160-4.5 MCG/ACT IN AERO: 2.0000 | INHALATION_SPRAY | Freq: Two times a day (BID) | RESPIRATORY_TRACT | 6 refills | Status: DC

## 2021-03-18 NOTE — Progress Notes (Signed)
Subjective:    Patient ID: Courtney Pineda, female    DOB: [birth year], 50 y.o.   MRN: [id number] Chief Complaint  Patient presents with   Follow-up    Dry cough.     HPI Courtney Pineda is a 50 year old lifelong never smoker who presents for follow-up on the issue of chronic cough.  I first saw her on 30 January 2021 at that time she presented for a second opinion.  She had been previously evaluated by Dr. Christinia Gully and had also seen our nurse practitioner Derl Barrow.  Pulmonary function testing on 20 September 2020 was consistent with asthma.  She did have methacholine challenge with significant airways reactivity.  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  At her prior visit we had given her a trial of Breo Ellipta she states it really did not help her much but she did not report back to [place] as requested she has been out of this medication for close to a month now.  She continues to have issues with the dry nonproductive cough.  She does not note any "rhyme or reason to the cough sometimes feels that when she is in the shower and may get worse.  Has not had issues with reflux.  Has had extensive GI evaluation.  Previous allergy testing at [facility] which showed allergies to some grasses and dust mites but no other major allergies.  Today she presents with almost constant dry cough in the office.  Her conjunctiva appear injected.  She has had no fevers, chills or sweats.  No sputum production.  No hemoptysis.  Review of Systems A 10 point review of systems was performed and it is as noted above otherwise negative.  Patient Active Problem List   Diagnosis Date Noted   Asthma 11/12/2020   DOE (dyspnea on exertion) 07/09/2020   Allergic rhinitis 12/25/2019   Vaccine reaction 07/26/2019   Perimenopause 06/08/2018   Low HDL (under  40) 06/08/2018   [place] intestinal bacterial overgrowth 02/09/2018   Cyst of right kidney 06/04/2017   RUQ abdominal pain 05/31/2017   Elevated glucose level 05/22/2017   S/P laparoscopic hysterectomy 01/14/2017   Abdominal pain 07/27/2016   Daily headache 06/01/2016   Chronic cough 06/01/2016   Encounter for routine gynecological examination 05/23/2014   Burning sensation of mouth 09/12/2013   Elevated transaminase level 01/13/2011   Routine general medical examination at a health care facility 01/05/2011   KIDNEY STONE 03/27/2010   Atypical chest pain 07/18/2008   STRESS REACTION, ACUTE, WITH EMOTIONAL DISTURBANCE 09/13/2007   HEADACHE 09/13/2007   Social History   Tobacco Use   Smoking status: Never   Smokeless tobacco: Never  Substance Use Topics   Alcohol use: No    Alcohol/week: 0.0 standard drinks   Allergies  Allergen Reactions   Amoxicillin Anaphylaxis   Bee Venom Anaphylaxis   Sulfa Antibiotics Anaphylaxis   Sulfonamide Derivatives Anaphylaxis    Lungs close up   Penicillins Rash    Has patient had a PCN reaction causing immediate rash, facial/tongue/throat swelling, SOB or lightheadedness with hypotension: No Has patient had a PCN reaction causing severe rash involving mucus membranes or skin necrosis: No Has patient had a PCN reaction that required hospitalization: No Has patient had a PCN reaction occurring within the last 10 years: No If all of the above answers  are "NO", then may proceed with Cephalosporin use.    Current Meds  Medication Sig   acetaminophen (TYLENOL) 500 MG tablet Take 500 mg by mouth every 6 (six) hours as needed.   Cholecalciferol (D3-1000 PO) Take by mouth.   [DISCONTINUED] fluticasone furoate-vilanterol (BREO ELLIPTA) 200-25 MCG/ACT AEPB Inhale 1 puff into the lungs daily.   Immunization History  Administered Date(s) Administered   Influenza Split 01/13/2011, 02/02/2012   Influenza Whole 03/22/2002, 12/27/2009    Influenza,inj,Quad PF,6+ Mos 01/20/2013, 01/26/2014, 01/25/2015, 02/12/2016, 01/27/2017, 01/06/2018, 01/05/2019, 01/17/2020, 01/17/2021   Janssen (J&J) SARS-COV-2 Vaccination 07/19/2019   Td 06/15/2002   Tdap 03/14/2013       Objective:   Physical Exam BP 112/64 (BP Location: Left Arm, Cuff Size: Normal)   Pulse 60   Temp 98.1 F (36.7 C) (Temporal)   Ht 5\' 4"  (1.626 m)   Wt 136 lb 6.4 oz (61.9 kg)   LMP 01/08/2017   SpO2 96%   BMI 23.41 kg/m  GENERAL: Well nourished, no acute distress.   HEAD: Normocephalic, atraumatic.  EYES: Pupils equal, round, reactive to light.  No scleral icterus.  Injected conjunctiva. MOUTH: Nose/mouth/throat not examined due to masking requirements for COVID 19. NECK: Supple. No thyromegaly. Trachea midline. No JVD.  No adenopathy. PULMONARY: Good air entry bilaterally.  No adventitious sounds. CARDIOVASCULAR: S1 and S2. Regular rate and rhythm.  No rubs, murmurs or gallops heard. ABDOMEN: Benign. MUSCULOSKELETAL: No joint deformity, no clubbing, no edema.  NEUROLOGIC: No focal deficit, no gait disturbance, speech is fluent. SKIN: Intact,warm,dry. PSYCH: Mood and behavior normal.    Representative image of the CT performed for November 2022:       Assessment & Plan:     ICD-10-CM   1. Chronic cough  R05.3 Alpha-1 antitrypsin phenotype    CBC w/Diff    Allergen Panel (27) + IGE   Suspect that this is due to cough variant asthma We will check CBC with differential and allergen profile Check alpha-1    2. Cough variant asthma  J45.991    Extend LABA/ICS trial Symbicort 160/4.5, 2 inhalations twice a day Add Spiriva 1.25 mcg 2 puffs once daily     3. SOB (shortness of breath)  R06.02    Related to poorly controlled asthma likely     Orders Placed This Encounter  Procedures   Alpha-1 antitrypsin phenotype    Standing Status:   Future    Number of Occurrences:   1    Standing Expiration Date:   03/18/2022   CBC w/Diff    Standing  Status:   Future    Number of Occurrences:   1    Standing Expiration Date:   03/18/2022   Allergen Panel (27) + IGE    Standing Status:   Future    Number of Occurrences:   1    Standing Expiration Date:   03/18/2022   Meds ordered this encounter  Medications   budesonide-formoterol (SYMBICORT) 160-4.5 MCG/ACT inhaler    Sig: Inhale 2 puffs into the lungs 2 (two) times daily.    Dispense:  1 each    Refill:  6   Tiotropium Bromide Monohydrate (SPIRIVA RESPIMAT) 1.25 MCG/ACT AERS    Sig: Inhale 2 puffs into the lungs daily.    Dispense:  4 g    Refill:  0    Order Specific Question:   Lot Number?    Answer:   [id number]    Order Specific Question:   Expiration Date?  Answer:   11/11/2021    Order Specific Question:   Quantity    Answer:   2   Patient in follow-up in 4 to 6 weeks time she is to call sooner should any new problems arise.  She can see either me or the nurse practitioner at that time.   Renold Don, MD [facility]ch[facility]  [facility]    *This note was dictated using voice recognition software/Dragon.  Despite best efforts to proofread, errors can occur which can change the meaning.  Any change was purely unintentional.

## 2021-03-18 NOTE — Patient Instructions (Signed)
We are getting a blood test to check for hereditary forms of emphysema.  Checking also for allergies.  We are giving her a trial of an inhaler that is goal Symbicort it is 2 puffs twice a day sent to your pharmacy.  Also add Spiriva 1.25 mcg 2 puffs daily you should do the Spiriva after your morning dose of Symbicort approximately 10 to 15 minutes after.  Sure you rinse your mouth well after use of inhalers.  We will see him in follow-up in 4 to 6 weeks time call sooner should any new problems arise.

## 2021-03-18 NOTE — Progress Notes (Deleted)
   Subjective:    Patient ID: Courtney Pineda, female    DOB: 1970-11-02, 50 y.o.   MRN: [id number]  HPI    Review of Systems     Objective:   Physical Exam        Assessment & Plan:

## 2021-03-21 LAB — ALLERGEN PANEL (27) + IGE
Alternaria Alternata IgE: <0.1 kU/L
Aspergillus Fumigatus IgE: <0.1 kU/L
Bahia Grass IgE: <0.1 kU/L
Bermuda Grass IgE: <0.1 kU/L
Cat Dander IgE: <0.1 kU/L
Cedar, Mountain IgE: <0.1 kU/L
Cladosporium Herbarum IgE: <0.1 kU/L
Cockroach, American IgE: <0.1 kU/L
D Farinae IgE: 1.12 kU/L — AB
D Pteronyssinus IgE: 0.92 kU/L — AB
Dog Dander IgE: <0.1 kU/L
Elm, American IgE: <0.1 kU/L
IgE (Immunoglobulin E), Serum: 17 IU/mL (ref 6–495)
Johnson Grass IgE: <0.1 kU/L
Kentucky Bluegrass IgE: 0.36 kU/L — AB
Maple/Box Elder IgE: <0.1 kU/L
Oak, White IgE: <0.1 kU/L
Penicillium Chrysogen IgE: <0.1 kU/L
Pigweed, Rough IgE: <0.1 kU/L
Plantain, English IgE: <0.1 kU/L
Ragweed, Short IgE: <0.1 kU/L
Timothy Grass IgE: 0.34 kU/L — AB

## 2021-03-24 ENCOUNTER — Telehealth: Payer: Self-pay

## 2021-03-24 DIAGNOSIS — R0602 Shortness of breath: Secondary | ICD-10-CM

## 2021-03-24 LAB — MISC LABCORP TEST (SEND OUT): Labcorp test code: 505375

## 2021-03-24 NOTE — Telephone Encounter (Signed)
Alpha 1 was not drawn. Miscellaneous lab for NMDAR Serum was noted in labs but was not ordered by our office.   Spoke to [place] with [facility] lab. Lovey Newcomer stated that it appears that miscellaneous lab was scanned into the incorrect chart. lab was not collected for Ms. Bors.  Alpha-1 was canceled by the system.  Enough blood was not drawn to run complete allergen panel. Patient can come back for alpha 1 and repeat allergen panel.  Dr. Patsey Berthold, how would you like to proceed?

## 2021-03-24 NOTE — Telephone Encounter (Signed)
Alpha-1 has been ordered.  Patient is aware of need for redraw. She voiced her understanding.  She will come by next week.   Dr. Patsey Berthold, so you want to repeat allergen panel as well?

## 2021-03-24 NOTE — Telephone Encounter (Signed)
Lets have her come back for repeat.

## 2021-03-25 NOTE — Telephone Encounter (Signed)
Allergen panel has been ordered. Patient is aware and voiced his understanding. Nothing further needed.

## 2021-03-25 NOTE — Telephone Encounter (Signed)
Yes lets do it to make sure results are accurate

## 2021-04-13 ENCOUNTER — Encounter: Payer: Self-pay | Admitting: Emergency Medicine

## 2021-04-13 ENCOUNTER — Telehealth: Payer: Self-pay | Admitting: Emergency Medicine

## 2021-04-13 ENCOUNTER — Ambulatory Visit
Admission: EM | Admit: 2021-04-13 | Discharge: 2021-04-13 | Disposition: A | Discharge status: Home / Self Care | Payer: BC Managed Care – PPO | Attending: Emergency Medicine | Admitting: Emergency Medicine

## 2021-04-13 DIAGNOSIS — R6889 Other general symptoms and signs: Secondary | ICD-10-CM

## 2021-04-13 DIAGNOSIS — Z20828 Contact with and (suspected) exposure to other viral communicable diseases: Secondary | ICD-10-CM

## 2021-04-13 LAB — POCT INFLUENZA A/B
Influenza A, POC: NEGATIVE
Influenza B, POC: NEGATIVE

## 2021-04-13 MED ORDER — OSELTAMIVIR PHOSPHATE 75 MG PO CAPS: 75.0000 mg | ORAL_CAPSULE | Freq: Two times a day (BID) | ORAL | 0 refills | Status: DC

## 2021-04-13 MED ORDER — ONDANSETRON 4 MG PO TBDP: 4.0000 mg | ORAL_TABLET | Freq: Three times a day (TID) | ORAL | 0 refills | Status: DC | PRN

## 2021-04-13 NOTE — ED Provider Notes (Signed)
[facility]    CSN: [id number] Arrival date & time: 04/13/21  1031      History   Chief Complaint Chief Complaint  Patient presents with   URI   Fever    HPI Courtney Pineda is a 51 y.o. female.  Patient presents with fever, chills, body aches, and sore throat since last night.   She denies rash, wheezing, shortness of breath, vomiting, diarrhea, or other symptoms.  No OTC medications taken today.  Patient has history of chronic cough and cough variant asthma; she is followed by pulmonology.  She was exposed to someone with influenza 2 days ago.   The history is provided by the patient and medical records.   Past Medical History:  Diagnosis Date   Anemia    Chronic right upper quadrant pain    Dysplastic nevus 09/16/2015   left infra axillary   Dysplastic nevus 10/27/2017   left side   History of kidney stones    Hyperemesis gravidarum 1999   [place] intestinal bacterial overgrowth 02/09/2018    Patient Active Problem List   Diagnosis Date Noted   Asthma 11/12/2020   DOE (dyspnea on exertion) 07/09/2020   Allergic rhinitis 12/25/2019   Vaccine reaction 07/26/2019   Perimenopause 06/08/2018   Low HDL (under 40) 06/08/2018   [place] intestinal bacterial overgrowth 02/09/2018   Cyst of right kidney 06/04/2017   RUQ abdominal pain 05/31/2017   Elevated glucose level 05/22/2017   S/P laparoscopic hysterectomy 01/14/2017   Abdominal pain 07/27/2016   Daily headache 06/01/2016   Chronic cough 06/01/2016   Encounter for routine gynecological examination 05/23/2014   Burning sensation of mouth 09/12/2013   Elevated transaminase level 01/13/2011   Routine general medical examination at a health care facility 01/05/2011   KIDNEY STONE 03/27/2010   Atypical chest pain 07/18/2008   STRESS REACTION, ACUTE, WITH EMOTIONAL DISTURBANCE 09/13/2007   HEADACHE 09/13/2007    Past Surgical History:  Procedure Laterality Date   ABDOMINAL HYSTERECTOMY      CHOLECYSTECTOMY  06/1994   COLONOSCOPY WITH PROPOFOL N/A 07/12/2017   Procedure: COLONOSCOPY WITH PROPOFOL;  Surgeon: Lin Landsman, MD;  Location: [facility];  Service: Gastroenterology;  Laterality: N/A;   CYSTOSCOPY N/A 01/14/2017   Procedure: CYSTOSCOPY;  Surgeon: Malachy Mood, MD;  Location: [facility];  Service: Gynecology;  Laterality: N/A;   ESOPHAGOGASTRODUODENOSCOPY (EGD) WITH PROPOFOL N/A 07/12/2017   Procedure: ESOPHAGOGASTRODUODENOSCOPY (EGD) WITH PROPOFOL;  Surgeon: Lin Landsman, MD;  Location: [facility];  Service: Gastroenterology;  Laterality: N/A;   KIDNEY STONE SURGERY     LITHOTRIPSY  03/2006   TONSILECTOMY, ADENOIDECTOMY, BILATERAL MYRINGOTOMY AND TUBES     myringotomy tubes   TOTAL LAPAROSCOPIC HYSTERECTOMY WITH SALPINGECTOMY Bilateral 01/14/2017   Procedure: HYSTERECTOMY TOTAL LAPAROSCOPIC WITH SALPINGECTOMY;  Surgeon: Malachy Mood, MD;  Location: [facility];  Service: Gynecology;  Laterality: Bilateral;    OB History     Gravida  2   Para  2   Term  2   Preterm  0   AB  0   Living  2      SAB  0   IAB  0   Ectopic  0   Multiple  0   Live Births  2            Home Medications    Prior to Admission medications   Medication Sig Start Date End Date Taking? Authorizing Provider  oseltamivir (TAMIFLU) 75 MG capsule Take 1 capsule (75 mg  total) by mouth every 12 (twelve) hours. 04/13/21  Yes Sharion Balloon, NP  acetaminophen (TYLENOL) 500 MG tablet Take 500 mg by mouth every 6 (six) hours as needed.    [provider]  budesonide-formoterol (SYMBICORT) 160-4.5 MCG/ACT inhaler Inhale 2 puffs into the lungs 2 (two) times daily. 03/18/21   Tyler Pita, MD  Cholecalciferol (D3-1000 PO) Take by mouth.    [provider]  Tiotropium Bromide Monohydrate (SPIRIVA RESPIMAT) 1.25 MCG/ACT AERS Inhale 2 puffs into the lungs daily. 03/18/21   Tyler Pita, MD    Family History Family History  Problem  Relation Age of Onset   Migraines Mother    Breast cancer Paternal Aunt        Contact   Cancer Maternal Grandmother 63       brain tumor/breast cancer   Breast cancer Maternal Grandmother    Heart disease Maternal Grandfather        heart problems   Colon cancer Neg Hx    Esophageal cancer Neg Hx    Liver disease Neg Hx    Pancreatic cancer Neg Hx    Stomach cancer Neg Hx    Inflammatory bowel disease Neg Hx     Social History Social History   Tobacco Use   Smoking status: Never   Smokeless tobacco: Never  Vaping Use   Vaping Use: Never used  Substance Use Topics   Alcohol use: No    Alcohol/week: 0.0 standard drinks   Drug use: No     Allergies   Amoxicillin, Bee venom, Sulfa antibiotics, Sulfonamide derivatives, and Penicillins   Review of Systems Review of Systems  Constitutional:  Positive for chills and fever.  HENT:  Positive for sore throat. Negative for ear pain.   Respiratory:  Positive for cough. Negative for shortness of breath and wheezing.   Cardiovascular:  Negative for chest pain and palpitations.  Gastrointestinal:  Negative for diarrhea and vomiting.  Musculoskeletal:  Negative for back pain.  Skin:  Negative for color change and rash.  All other systems reviewed and are negative.   Physical Exam Triage Vital Signs ED Triage Vitals  Enc Vitals Group     BP      Pulse      Resp      Temp      Temp src      SpO2      Weight      Height      Head Circumference      Peak Flow      Pain Score      Pain Loc      Pain Edu?      Excl. in [place]?    No data found.  Updated Vital Signs BP 118/72    Pulse 80    Temp 100 F (37.8 C) (Oral)    Resp 18    LMP 01/08/2017    SpO2 96%   Visual Acuity Right Eye Distance:   Left Eye Distance:   Bilateral Distance:    Right Eye Near:   Left Eye Near:    Bilateral Near:     Physical Exam Vitals and nursing note reviewed.  Constitutional:      General: She is not in acute distress.     Appearance: She is well-developed.  HENT:     Right Ear: Tympanic membrane normal.     Left Ear: Tympanic membrane normal.     Nose: Nose normal.  Mouth/Throat:     Mouth: Mucous membranes are moist.     Pharynx: Oropharynx is clear.  Cardiovascular:     Rate and Rhythm: Normal rate and regular rhythm.     Heart sounds: Normal heart sounds.  Pulmonary:     Effort: Pulmonary effort is normal. No respiratory distress.     Breath sounds: Normal breath sounds. No wheezing.  Abdominal:     Palpations: Abdomen is soft.     Tenderness: There is no abdominal tenderness.  Musculoskeletal:     Cervical back: Neck supple.  Skin:    General: Skin is warm and dry.  Neurological:     Mental Status: She is alert.  Psychiatric:        Mood and Affect: Mood normal.        Behavior: Behavior normal.     UC Treatments / Results  Labs (all labs ordered are listed, but only abnormal results are displayed) Labs Reviewed  COVID-19, FLU A+B NAA  POCT INFLUENZA A/B    EKG   Radiology No results found.  Procedures Procedures (including critical care time)  Medications Ordered in UC Medications - No data to display  Initial Impression / Assessment and Plan / UC Course  I have reviewed the triage vital signs and the nursing notes.  Pertinent labs & imaging results that were available during my care of the patient were reviewed by me and considered in my medical decision making (see chart for details).   Exposure to the flu; Flu-like symptoms.  Rapid flu is negative but patient has only been symptomatic for about 12 hours.  PCR COVID and Flu pending.  Starting patient on Tamiflu today based on her symptoms and exposure: Instructed her to stop taking the Tamiflu if her flu test comes back negative.  Instructed patient to self quarantine per CDC guidelines.  Discussed symptomatic treatment including Tylenol or ibuprofen, rest, hydration.  Instructed patient to follow up with PCP if symptoms  are not improving.  Patient agrees to plan of care.   Final Clinical Impressions(s) / UC Diagnoses   Final diagnoses:  Exposure to the flu  Flu-like symptoms     Discharge Instructions      Take the Tamiflu as directed.  Your COVID and Flu tests are pending.  If the flu test comes back negative, stop the Tamiflu.    Take Tylenol or ibuprofen as needed for fever or discomfort.  Rest and keep yourself hydrated.    Follow-up with your primary care provider if your symptoms are not improving.         ED Prescriptions     Medication Sig Dispense Auth. Provider   oseltamivir (TAMIFLU) 75 MG capsule Take 1 capsule (75 mg total) by mouth every 12 (twelve) hours. 10 capsule Sharion Balloon, NP      PDMP not reviewed this encounter.   Sharion Balloon, NP 04/13/21 [phone]

## 2021-04-13 NOTE — Telephone Encounter (Signed)
Patient called UC and reported nausea and vomiting after taking Tamiflu.  Treating with Zofran.  Instructed patient to follow up with her PCP tomorrow.

## 2021-04-13 NOTE — Discharge Instructions (Addendum)
Take the Tamiflu as directed.  Your COVID and Flu tests are pending.  If the flu test comes back negative, stop the Tamiflu.    Take Tylenol or ibuprofen as needed for fever or discomfort.  Rest and keep yourself hydrated.    Follow-up with your primary care provider if your symptoms are not improving.

## 2021-04-13 NOTE — ED Triage Notes (Signed)
Pt here with direct exposure to flu over on Friday with sx and fever starting last night.

## 2021-04-17 LAB — COVID-19, FLU A+B NAA
Influenza A, NAA: DETECTED — AB
Influenza B, NAA: NOT DETECTED
SARS-CoV-2, NAA: NOT DETECTED

## 2021-04-23 ENCOUNTER — Other Ambulatory Visit
Admission: RE | Admit: 2021-04-23 | Discharge: 2021-04-23 | Disposition: A | Discharge status: Home / Self Care | Payer: BC Managed Care – PPO | Attending: Pulmonary Disease | Admitting: Pulmonary Disease

## 2021-04-23 ENCOUNTER — Ambulatory Visit: Payer: BC Managed Care – PPO | Admitting: Pulmonary Disease

## 2021-04-23 ENCOUNTER — Encounter: Payer: Self-pay | Admitting: Pulmonary Disease

## 2021-04-23 ENCOUNTER — Other Ambulatory Visit: Payer: Self-pay

## 2021-04-23 VITALS — BP 110/80 | HR 55 | Temp 97.3°F | Ht 64.0 in | Wt 134.2 lb

## 2021-04-23 DIAGNOSIS — J4541 Moderate persistent asthma with (acute) exacerbation: Secondary | ICD-10-CM

## 2021-04-23 DIAGNOSIS — R053 Chronic cough: Secondary | ICD-10-CM | POA: Diagnosis not present

## 2021-04-23 DIAGNOSIS — R0602 Shortness of breath: Secondary | ICD-10-CM | POA: Insufficient documentation

## 2021-04-23 MED ORDER — METHYLPREDNISOLONE 4 MG PO TBPK: ORAL_TABLET | ORAL | 0 refills | Status: DC

## 2021-04-23 MED ORDER — MONTELUKAST SODIUM 10 MG PO TABS: 10.0000 mg | ORAL_TABLET | Freq: Every day | ORAL | 3 refills | Status: DC

## 2021-04-23 MED ORDER — SPIRIVA RESPIMAT 2.5 MCG/ACT IN AERS: 2.0000 | INHALATION_SPRAY | Freq: Every day | RESPIRATORY_TRACT | 0 refills | Status: DC

## 2021-04-23 NOTE — Progress Notes (Signed)
Subjective:    Patient ID: Courtney Pineda, female    DOB: 1971/01/10, 51 y.o.   MRN: [id number] Chief Complaint  Patient presents with   Follow-up    Chronic Cough   HPI Courtney Pineda is a 51 year old lifelong never smoker who presents for follow-up on the issue of chronic cough.  She does have moderate persistent asthma with cough variant.  Last evaluated here on 18 March 2021.  At that time she was placed on Symbicort 160/4.5, 2 inhalations twice a day and Spiriva 1.25 mcg, 2 puffs daily.  She states that actually she did very well with that regimen until 1 January when she had flu A.  Her cough since then has gotten worse.  She notes that taking a shower or cold weather trigger her cough.  Since her bout with the flu, she has not had any fevers, chills or sweats.  Cough continues to be dry and nonproductive.  No hemoptysis.  She does not endorse any other symptomatology.  She is in no distress.   Review of Systems A 10 point review of systems was performed and it is as noted above otherwise negative.  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   Cyst of right kidney 06/04/2017   RUQ abdominal pain 05/31/2017   Elevated glucose level 05/22/2017   S/P laparoscopic hysterectomy 01/14/2017   Abdominal pain 07/27/2016   Daily headache 06/01/2016   Chronic cough 06/01/2016   Encounter for routine gynecological examination 05/23/2014   Burning sensation of mouth 09/12/2013   Elevated transaminase level 01/13/2011   Routine general medical examination at a health care facility 01/05/2011   KIDNEY STONE 03/27/2010   Atypical chest pain 07/18/2008   STRESS REACTION, ACUTE, WITH EMOTIONAL DISTURBANCE 09/13/2007   HEADACHE 09/13/2007   Social  History   Tobacco Use   Smoking status: Never   Smokeless tobacco: Never  Substance Use Topics   Alcohol use: No    Alcohol/week: 0.0 standard drinks   Allergies  Allergen Reactions   Amoxicillin Anaphylaxis   Bee Venom Anaphylaxis   Sulfa Antibiotics Anaphylaxis   Sulfonamide Derivatives Anaphylaxis    Lungs close up   Penicillins Rash    Has patient had a PCN reaction causing immediate rash, facial/tongue/throat swelling, SOB or lightheadedness with hypotension: No Has patient had a PCN reaction causing severe rash involving mucus membranes or skin necrosis: No Has patient had a PCN reaction that required hospitalization: No Has patient had a PCN reaction occurring within the last 10 years: No If all of the above answers are "NO", then may proceed with Cephalosporin use.    Current Meds  Medication Sig   acetaminophen (TYLENOL) 500 MG tablet Take 500 mg by mouth every 6 (six) hours as needed.   budesonide-formoterol (SYMBICORT) 160-4.5 MCG/ACT inhaler Inhale 2 puffs into the lungs 2 (two) times daily.   Cholecalciferol (D3-1000 PO) Take by mouth.   Tiotropium Bromide Monohydrate (SPIRIVA RESPIMAT) 1.25 MCG/ACT AERS Inhale 2 puffs into the lungs daily.   [DISCONTINUED] ondansetron (ZOFRAN-ODT) 4 MG disintegrating tablet Take 1 tablet (4 mg total) by mouth every 8 (eight) hours as needed for nausea or vomiting.   [DISCONTINUED] oseltamivir (TAMIFLU) 75 MG capsule Take 1 capsule (75 mg total) by mouth every  12 (twelve) hours.   Immunization History  Administered Date(s) Administered   Influenza Split 01/13/2011, 02/02/2012   Influenza Whole 03/22/2002, 12/27/2009   Influenza,inj,Quad PF,6+ Mos 01/20/2013, 01/26/2014, 01/25/2015, 02/12/2016, 01/27/2017, 01/06/2018, 01/05/2019, 01/17/2020, 01/17/2021   Janssen (J&J) SARS-COV-2 Vaccination 07/19/2019   Td 06/15/2002   Tdap 03/14/2013       Objective:   Physical Exam BP 110/80 (BP Location: Left Arm, Patient Position:  Sitting, Cuff Size: Normal)    Pulse (!) 55    Temp (!) 97.3 F (36.3 C) (Oral)    Ht 5\' 4"  (1.626 m)    Wt 134 lb 3.2 oz (60.9 kg)    LMP 01/08/2017    SpO2 98%    BMI 23.04 kg/m  GENERAL: Well-developed, well-nourished woman, no acute distress fully ambulatory.  No conversational dyspnea.  Intermittent dry cough. HEAD: Normocephalic, atraumatic.  EYES: Pupils equal, round, reactive to light.  No scleral icterus.  Slightly injected conjunctiva. MOUTH: Nose/mouth/throat not examined due to masking requirements for COVID 19. NECK: Supple. No thyromegaly. Trachea midline. No JVD.  No adenopathy. PULMONARY: Slightly diminished bilaterally.  No adventitious sounds. CARDIOVASCULAR: S1 and S2. Regular rate and rhythm.  No rubs, murmurs or gallops heard. ABDOMEN: Benign. MUSCULOSKELETAL: No joint deformity, no clubbing, no edema.  NEUROLOGIC: No focal deficit, no gait disturbance, speech is fluent. SKIN: Intact,warm,dry. PSYCH: Mood and behavior normal.     Assessment & Plan:     ICD-10-CM   1. Moderate persistent asthma with (acute) exacerbation  J45.41    Continue Symbicort 160/4.5, 2 inhalations twice a day Increased Spiriva to 2.5 mcg dose 2 puffs daily Added montelukast 10 mg daily Medrol Dosepak    2. Chronic cough  R05.3    Exacerbated due to recent influenza A Increased Spiriva to help with postinfectious cough Added Singulair     Meds ordered this encounter  Medications   methylPREDNISolone (MEDROL DOSEPAK) 4 MG TBPK tablet    Sig: As directed in the package    Dispense:  21 tablet    Refill:  0   Tiotropium Bromide Monohydrate (SPIRIVA RESPIMAT) 2.5 MCG/ACT AERS    Sig: Inhale 2 puffs into the lungs daily.    Dispense:  4 g    Refill:  0    Order Specific Question:   Lot Number?    Answer:   [id number]    Order Specific Question:   Expiration Date?    Answer:   06/10/2022    Order Specific Question:   NDC    Answer:   0263-7858-85 [[id number]]    Order Specific Question:    Quantity    Answer:   2   montelukast (SINGULAIR) 10 MG tablet    Sig: Take 1 tablet (10 mg total) by mouth daily.    Dispense:  90 tablet    Refill:  3   Patient had actually been doing better on Symbicort and Spiriva.  However after influenza A her cough has really exacerbated.  She has exacerbation of asthma and postinfectious cough combination.  Management will be as above.  We will see the patient in follow-up in 3 to 4 weeks time she is to either see me or the nurse practitioner at the time.  If her symptoms persist consider adding Dupixent.  Renold Don, MD Advanced Bronchoscopy PCCM [facility]    *This note was dictated using voice recognition software/Dragon.  Despite best efforts to proofread, errors can occur which can change the meaning. Any transcriptional errors that  result from this process are unintentional and may not be fully corrected at the time of dictation.

## 2021-04-23 NOTE — Patient Instructions (Addendum)
We sent a prescription to your pharmacy for a Medrol Dosepak to see if this helps you with your cough flare.  Increased the dose of Spiriva to 2.5 mcg, 2 puffs twice daily.  Let us know if this dose works better for you.  We have also started Singulair.  1 tablet daily.   We will see him in follow-up in 3 to 4 weeks time with either me or the nurse practitioner at that time.  We will consider at that time referral to get to a medication called Dupixent for control of your asthma.

## 2021-04-29 LAB — ALPHA-1-ANTITRYPSIN PHENOTYP: A-1 Antitrypsin, Ser: 152 mg/dL (ref 101–187)

## 2021-04-30 ENCOUNTER — Other Ambulatory Visit: Payer: Self-pay | Admitting: Family Medicine

## 2021-04-30 ENCOUNTER — Telehealth: Payer: Self-pay | Admitting: Family Medicine

## 2021-04-30 DIAGNOSIS — Z1231 Encounter for screening mammogram for malignant neoplasm of breast: Secondary | ICD-10-CM | POA: Insufficient documentation

## 2021-04-30 NOTE — Addendum Note (Signed)
Addended by: Loura Pardon A on: 04/30/2021 11:02 AM   Modules accepted: Orders

## 2021-04-30 NOTE — Telephone Encounter (Signed)
Patient notified as instructed by telephone that order has been put in and she verbalized understanding.

## 2021-04-30 NOTE — Telephone Encounter (Signed)
I put the order in  Now she should be able to schedule it

## 2021-04-30 NOTE — Telephone Encounter (Signed)
Mrs. Hone called in and stated that she is needing to schedule her mammogram but they told her that due to she didn't have her CPE she cant get her mammogram. She was going to [facility]  . And she wanted to go on 3/27

## 2021-04-30 NOTE — Telephone Encounter (Signed)
Pt notified order in and she will call to schedule appt

## 2021-05-04 ENCOUNTER — Encounter: Payer: Self-pay | Admitting: Family Medicine

## 2021-05-05 NOTE — Telephone Encounter (Signed)
Labs printed and placed in your inbox for review

## 2021-05-29 ENCOUNTER — Ambulatory Visit: Payer: BC Managed Care – PPO | Admitting: Pulmonary Disease

## 2021-05-29 ENCOUNTER — Encounter: Payer: Self-pay | Admitting: Pulmonary Disease

## 2021-05-29 VITALS — BP 128/80 | HR 55 | Temp 98.2°F | Ht 64.0 in | Wt 137.8 lb

## 2021-05-29 DIAGNOSIS — J45991 Cough variant asthma: Secondary | ICD-10-CM | POA: Diagnosis not present

## 2021-05-29 DIAGNOSIS — R053 Chronic cough: Secondary | ICD-10-CM | POA: Diagnosis not present

## 2021-05-29 MED ORDER — SPIRIVA RESPIMAT 2.5 MCG/ACT IN AERS: 2.0000 | INHALATION_SPRAY | Freq: Every day | RESPIRATORY_TRACT | 0 refills | Status: DC

## 2021-05-29 MED ORDER — SPIRIVA RESPIMAT 2.5 MCG/ACT IN AERS: 2.0000 | INHALATION_SPRAY | Freq: Every day | RESPIRATORY_TRACT | 11 refills | Status: DC

## 2021-05-29 NOTE — Progress Notes (Signed)
Subjective:    Patient ID: Courtney Pineda, female    DOB: March 03, 1971, 51 y.o.   MRN: [id number] Patient Care Team: Abner Greenspan, MD as PCP - General  Chief Complaint  Patient presents with   Follow-up    HPI Courtney Pineda is a 51 year old lifelong never smoker who presents for follow-up on the issue of chronic cough.  She does have moderate persistent asthma with cough variant.  Last evaluated here on 23 April 2021 at that time she was placed on Symbicort 160/4.5, 2 inhalations twice a day and Spiriva 1.25 mcg, 2 puffs daily.  She states that actually she did very well with that regimen until 1 January when she had flu A.  Her cough then, got worse.  Her last visit she was treated with a methylprednisolone Dosepak, Singulair was added and her Spiriva was increased to the 2.5 mcg strength.  Since then she notes that the cough has improved again.  She notes that taking a shower or cold weather trigger her cough but this is better since Spiriva was increased.  She has not had any fevers, chills or sweats.  Cough continues to be dry and nonproductive.  No hemoptysis.  She does not endorse any other symptomatology.  Overall she feels well and looks well.   Review of Systems A 10 point review of systems was performed and it is as noted above otherwise negative.  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   Cyst of right kidney 06/04/2017   RUQ abdominal pain 05/31/2017   Elevated glucose level 05/22/2017   S/P laparoscopic hysterectomy 01/14/2017   Abdominal pain 07/27/2016   Daily headache 06/01/2016   Chronic cough 06/01/2016   Encounter for routine gynecological examination  05/23/2014   Burning sensation of mouth 09/12/2013   Elevated transaminase level 01/13/2011   Routine general medical examination at a health care facility 01/05/2011   KIDNEY STONE 03/27/2010   Atypical chest pain 07/18/2008   STRESS REACTION, ACUTE, WITH EMOTIONAL DISTURBANCE 09/13/2007   HEADACHE 09/13/2007   BV (bacterial vaginosis) 04/05/2007   Acute sinusitis 02/05/2007   Social History   Tobacco Use   Smoking status: Never   Smokeless tobacco: Never  Substance Use Topics   Alcohol use: No    Alcohol/week: 0.0 standard drinks of alcohol   Allergies  Allergen Reactions   Amoxicillin Anaphylaxis   Bee Venom Anaphylaxis   Sulfa Antibiotics Anaphylaxis   Sulfonamide Derivatives Anaphylaxis    Lungs close up   Covid-19 (Mrna) Vaccine     J and J Local reaction    Penicillins Rash    Has patient had a PCN reaction causing immediate rash, facial/tongue/throat swelling, SOB or lightheadedness with hypotension: No Has patient had a PCN reaction causing severe rash involving mucus membranes or skin necrosis: No Has patient had a PCN reaction that required hospitalization: No Has patient had a PCN reaction occurring within the last 10 years: No If all of the above answers are "NO", then may proceed with Cephalosporin use.    Current Meds  Medication Sig   acetaminophen (TYLENOL) 500 MG tablet Take 500 mg by mouth every 6 (six) hours as needed.   budesonide-formoterol (SYMBICORT)  160-4.5 MCG/ACT inhaler Inhale 2 puffs into the lungs 2 (two) times daily.   Cholecalciferol (D3-1000 PO) Take by mouth.   montelukast (SINGULAIR) 10 MG tablet Take 1 tablet (10 mg total) by mouth daily.   Tiotropium Bromide Monohydrate (SPIRIVA RESPIMAT) 1.25 MCG/ACT AERS Inhale 2 puffs into the lungs daily.   Immunization History  Administered Date(s) Administered   Influenza Split 01/13/2011, 02/02/2012   Influenza Whole 03/22/2002, 12/27/2009   Influenza,inj,Quad PF,6+ Mos 01/20/2013,  01/26/2014, 01/25/2015, 02/12/2016, 01/27/2017, 01/06/2018, 01/05/2019, 01/17/2020, 01/17/2021   Janssen (J&J) SARS-COV-2 Vaccination 07/19/2019   Td 06/15/2002   Tdap 03/14/2013      Objective:   Physical Exam BP 128/80 (BP Location: Left Arm, Patient Position: Sitting, Cuff Size: Normal)   Pulse (!) 55   Temp 98.2 F (36.8 C) (Oral)   Ht 5' 4"$  (1.626 m)   Wt 137 lb 12.8 oz (62.5 kg)   LMP 01/08/2017 (Exact Date)   SpO2 98%   BMI 23.65 kg/m   GENERAL: Well-developed, well-nourished woman, no acute distress fully ambulatory.  No conversational dyspnea.  Intermittent dry cough. HEAD: Normocephalic, atraumatic.  EYES: Pupils equal, round, reactive to light.  No scleral icterus.  Slightly injected conjunctiva. MOUTH: Nose/mouth/throat not examined due to masking requirements for COVID 19. NECK: Supple. No thyromegaly. Trachea midline. No JVD.  No adenopathy. PULMONARY: Slightly diminished bilaterally.  No adventitious sounds. CARDIOVASCULAR: S1 and S2. Regular rate and rhythm.  No rubs, murmurs or gallops heard. ABDOMEN: Benign. MUSCULOSKELETAL: No joint deformity, no clubbing, no edema.  NEUROLOGIC: No focal deficit, no gait disturbance, speech is fluent. SKIN: Intact,warm,dry. PSYCH: Mood and behavior normal.       Assessment & Plan:     ICD-10-CM   1. Chronic cough  R05.3    Notes improvement with the addition of Spiriva Continue Spiriva for now    2. Cough variant asthma  J45.991    Moderate persistent asthma with cough variant Continue Symbicort 160/4.5, 2 inhalations twice a day may Continue Spiriva 2.5 mcg 2 puffs/d Continue Singulair     Meds ordered this encounter  Medications   Tiotropium Bromide Monohydrate (SPIRIVA RESPIMAT) 2.5 MCG/ACT AERS    Sig: Inhale 2 puffs into the lungs daily.    Dispense:  4 g    Refill:  11   Tiotropium Bromide Monohydrate (SPIRIVA RESPIMAT) 2.5 MCG/ACT AERS    Sig: Inhale 2 puffs into the lungs daily.    Dispense:  4 g     Refill:  0    Order Specific Question:   Lot Number?    Answer:   [id number]    [id number]    Order Specific Question:   Expiration Date?    Answer:   10/10/2021    Comments:   06/10/2022    Order Specific Question:   [place]    Answer:   [id number]    Order Specific Question:   Quantity    Answer:   2   Will see the patient in follow-up in 4 months time she is to contact us prior to that time should any new difficulties arise.  Renold Don, MD Advanced Bronchoscopy PCCM [facility]    *This note was dictated using voice recognition software/Dragon.  Despite best efforts to proofread, errors can occur which can change the meaning. Any transcriptional errors that result from this process are unintentional and may not be fully corrected at the time of dictation.

## 2021-05-29 NOTE — Patient Instructions (Signed)
Continue the Symbicort and Spiriva  We are giving you some more samples of the Spiriva and will send the prescription to your pharmacy that will be available in the future.  We will see you in follow-up in 4 months time call sooner should any new problems arise.

## 2021-06-09 ENCOUNTER — Telehealth: Payer: Self-pay | Admitting: Family Medicine

## 2021-06-09 DIAGNOSIS — R7309 Other abnormal glucose: Secondary | ICD-10-CM

## 2021-06-09 DIAGNOSIS — Z0000 Encounter for general adult medical examination without abnormal findings: Secondary | ICD-10-CM

## 2021-06-09 NOTE — Telephone Encounter (Signed)
-----   Message from Ellamae Sia sent at 05/30/2021  2:10 PM EST ----- Regarding: Lab orders for Tuesday, 2.28.23 Patient is scheduled for CPX labs, please order future labs, Thanks , Karna Christmas

## 2021-06-10 ENCOUNTER — Other Ambulatory Visit: Payer: Self-pay

## 2021-06-10 ENCOUNTER — Other Ambulatory Visit (INDEPENDENT_AMBULATORY_CARE_PROVIDER_SITE_OTHER): Payer: BC Managed Care – PPO

## 2021-06-10 DIAGNOSIS — R7309 Other abnormal glucose: Secondary | ICD-10-CM

## 2021-06-10 DIAGNOSIS — Z0000 Encounter for general adult medical examination without abnormal findings: Secondary | ICD-10-CM | POA: Diagnosis not present

## 2021-06-10 LAB — CBC WITH DIFFERENTIAL/PLATELET
Basophils Absolute: 0 10*3/uL (ref 0.0–0.1)
Basophils Relative: 0.8 % (ref 0.0–3.0)
Eosinophils Absolute: 0.1 10*3/uL (ref 0.0–0.7)
Eosinophils Relative: 1.1 % (ref 0.0–5.0)
HCT: 41.3 % (ref 36.0–46.0)
Hemoglobin: 13.9 g/dL (ref 12.0–15.0)
Lymphocytes Relative: 26.3 % (ref 12.0–46.0)
Lymphs Abs: 1.5 10*3/uL (ref 0.7–4.0)
MCHC: 33.6 g/dL (ref 30.0–36.0)
MCV: 91.9 fl (ref 78.0–100.0)
Monocytes Absolute: 0.4 10*3/uL (ref 0.1–1.0)
Monocytes Relative: 6.5 % (ref 3.0–12.0)
Neutro Abs: 3.8 10*3/uL (ref 1.4–7.7)
Neutrophils Relative %: 65.3 % (ref 43.0–77.0)
Platelets: 240 10*3/uL (ref 150.0–400.0)
RBC: 4.49 Mil/uL (ref 3.87–5.11)
RDW: 14.1 % (ref 11.5–15.5)
WBC: 5.8 10*3/uL (ref 4.0–10.5)

## 2021-06-10 LAB — COMPREHENSIVE METABOLIC PANEL
ALT: 24 U/L (ref 0–35)
AST: 25 U/L (ref 0–37)
Albumin: 4.3 g/dL (ref 3.5–5.2)
Alkaline Phosphatase: 83 U/L (ref 39–117)
BUN: 13 mg/dL (ref 6–23)
CO2: 28 mEq/L (ref 19–32)
Calcium: 9.7 mg/dL (ref 8.4–10.5)
Chloride: 104 mEq/L (ref 96–112)
Creatinine, Ser: 0.91 mg/dL (ref 0.40–1.20)
GFR: 74.76 mL/min (ref >60.00)
Glucose, Bld: 86 mg/dL (ref 70–99)
Potassium: 4 mEq/L (ref 3.5–5.1)
Sodium: 140 mEq/L (ref 135–145)
Total Bilirubin: 1.3 mg/dL — ABNORMAL HIGH (ref 0.2–1.2)
Total Protein: 6.7 g/dL (ref 6.0–8.3)

## 2021-06-10 LAB — HEMOGLOBIN A1C: Hgb A1c MFr Bld: 5.1 % (ref 4.6–6.5)

## 2021-06-10 LAB — LIPID PANEL
Cholesterol: 168 mg/dL (ref 0–200)
HDL: 44.3 mg/dL (ref >39.00)
LDL Cholesterol: 113 mg/dL — ABNORMAL HIGH (ref 0–99)
NonHDL: 123.24
Total CHOL/HDL Ratio: 4
Triglycerides: 52 mg/dL (ref 0.0–149.0)
VLDL: 10.4 mg/dL (ref 0.0–40.0)

## 2021-06-10 LAB — TSH: TSH: 1.25 u[IU]/mL (ref 0.35–5.50)

## 2021-06-17 ENCOUNTER — Encounter: Payer: Self-pay | Admitting: Family Medicine

## 2021-06-17 ENCOUNTER — Other Ambulatory Visit: Payer: Self-pay

## 2021-06-17 ENCOUNTER — Ambulatory Visit (INDEPENDENT_AMBULATORY_CARE_PROVIDER_SITE_OTHER): Payer: BC Managed Care – PPO | Admitting: Family Medicine

## 2021-06-17 VITALS — BP 108/66 | HR 59 | Temp 98.0°F | Ht 64.25 in | Wt 135.2 lb

## 2021-06-17 DIAGNOSIS — E786 Lipoprotein deficiency: Secondary | ICD-10-CM

## 2021-06-17 DIAGNOSIS — R7309 Other abnormal glucose: Secondary | ICD-10-CM

## 2021-06-17 DIAGNOSIS — Z0000 Encounter for general adult medical examination without abnormal findings: Secondary | ICD-10-CM | POA: Diagnosis not present

## 2021-06-17 DIAGNOSIS — R053 Chronic cough: Secondary | ICD-10-CM | POA: Diagnosis not present

## 2021-06-17 NOTE — Patient Instructions (Addendum)
For cholesterol ? ?Avoid red meat/ fried foods/ egg yolks/ fatty breakfast meats/ butter, cheese and high fat dairy/ and shellfish   ? ?Take care of yourself  ? ?Keep up good fluid intake ?Wear sun protection  ?Take vitamin D for bone health  ? ?Labs look stable  ? ? ? ? ? ? ?

## 2021-06-17 NOTE — Assessment & Plan Note (Signed)
Cough variant asthma  ?Sees pulmonary ?singulair ?symbicort  ?spiriva ?

## 2021-06-17 NOTE — Assessment & Plan Note (Signed)
Reviewed health habits including diet and exercise and skin cancer prevention ?Reviewed appropriate screening tests for age  ?Also reviewed health mt list, fam hx and immunization status , as well as social and family history   ?See HPI ?Labs reviewed  ?Mammogram is scheduled 07/07/21 ?Sees gyn also  ?Colonoscopy utd ?imms utd (cannot take the covid imm)  ?Good health habits  ?

## 2021-06-17 NOTE — Progress Notes (Signed)
Subjective:    Patient ID: Courtney Pineda, female    DOB: 04-14-1970, 51 y.o.   MRN: [id number]  This visit occurred during the SARS-CoV-2 public health emergency.  Safety protocols were in place, including screening questions prior to the visit, additional usage of staff PPE, and extensive cleaning of exam room while observing appropriate contact time as indicated for disinfecting solutions.   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  Went on a 3 d cruise with husband   Lost a great aunt she was close to   Mammogram 06/2020, it is scheduled 3/27 Self breast exam -no lumps  Had a hysterectomy  Plans to f/u with gyn Alecia Copland    Colonoscopy 07/2017    Tdap 03/2013 Flu shot 01/2021 Covid shot-reaction   Seeing pulmonary for her chronic cough  Dx with cough variant asthma  Inhaler helps some , symbicort , spiriva  Also singulair  Worse when she goes outside     BP Readings from Last 3 Encounters:  06/17/21 108/66  05/29/21 128/80  04/23/21 110/80   Pulse Readings from Last 3 Encounters:  06/17/21 (!) 59  05/29/21 (!) 55  04/23/21 (!) 55   Past elevated glucose Lab Results  Component Value Date   HGBA1C 5.1 06/10/2021     Lab Results  Component Value Date   CREATININE 0.91 06/10/2021   BUN 13 06/10/2021   NA 140 06/10/2021   K 4.0 06/10/2021   CL 104 06/10/2021   CO2 28 06/10/2021   Lab Results  Component Value Date   ALT 24 06/10/2021   AST 25 06/10/2021   ALKPHOS 83 06/10/2021   BILITOT 1.3 (H) 06/10/2021   Cholesterol Lab Results  Component Value Date   CHOL 168 06/10/2021   CHOL 162 06/03/2020   CHOL 172 06/05/2019   Lab Results  Component Value Date   HDL  44.30 06/10/2021   HDL 35.80 (L) 06/03/2020   HDL 41.00 06/05/2019   Lab Results  Component Value Date   LDLCALC 113 (H) 06/10/2021   LDLCALC 101 (H) 06/03/2020   LDLCALC 107 (H) 06/05/2019   Lab Results  Component Value Date   TRIG 52.0 06/10/2021   TRIG 126.0 06/03/2020   TRIG 120.0 06/05/2019   Lab Results  Component Value Date   CHOLHDL 4 06/10/2021   CHOLHDL 5 06/03/2020   CHOLHDL 4 06/05/2019   No results found for: LDLDIRECT H/o low HDL  Exercise = going to the gym for a year  Following a healthy diet plan  In a program called the "year of change" Does classes -cardio and muscle (likes the burn class)   Does try to avoid fried food and red meat  Eats some eggs  Some bacon= [place] bacon at home    Lab Results  Component Value Date   TSH 1.25 06/10/2021   Lab Results  Component Value Date   WBC 5.8 06/10/2021   HGB 13.9 06/10/2021   HCT 41.3 06/10/2021   MCV 91.9 06/10/2021   PLT 240.0 06/10/2021     Patient Active Problem List   Diagnosis Date Noted   Encounter for screening  mammogram for breast cancer 04/30/2021   Asthma 11/12/2020   DOE (dyspnea on exertion) 07/09/2020   Allergic rhinitis 12/25/2019   Vaccine reaction 07/26/2019   Perimenopause 06/08/2018   Low HDL (under 40) 06/08/2018   [place] intestinal bacterial overgrowth 02/09/2018   Cyst of right kidney 06/04/2017   RUQ abdominal pain 05/31/2017   Elevated glucose level 05/22/2017   S/P laparoscopic hysterectomy 01/14/2017   Abdominal pain 07/27/2016   Daily headache 06/01/2016   Chronic cough 06/01/2016   Encounter for routine gynecological examination 05/23/2014   Burning sensation of mouth 09/12/2013   Elevated transaminase level 01/13/2011   Routine general medical examination at a health care facility 01/05/2011   KIDNEY STONE 03/27/2010   Atypical chest pain 07/18/2008   STRESS REACTION, ACUTE, WITH EMOTIONAL DISTURBANCE 09/13/2007   HEADACHE 09/13/2007   Past Medical  History:  Diagnosis Date   Anemia    Chronic right upper quadrant pain    Dysplastic nevus 09/16/2015   left infra axillary   Dysplastic nevus 10/27/2017   left side   History of kidney stones    Hyperemesis gravidarum 1999   [place] intestinal bacterial overgrowth 02/09/2018   Past Surgical History:  Procedure Laterality Date   ABDOMINAL HYSTERECTOMY     CHOLECYSTECTOMY  06/1994   COLONOSCOPY WITH PROPOFOL N/A 07/12/2017   Procedure: COLONOSCOPY WITH PROPOFOL;  Surgeon: Lin Landsman, MD;  Location: [facility] ENDOSCOPY;  Service: Gastroenterology;  Laterality: N/A;   CYSTOSCOPY N/A 01/14/2017   Procedure: CYSTOSCOPY;  Surgeon: Malachy Mood, MD;  Location: [facility] ORS;  Service: Gynecology;  Laterality: N/A;   ESOPHAGOGASTRODUODENOSCOPY (EGD) WITH PROPOFOL N/A 07/12/2017   Procedure: ESOPHAGOGASTRODUODENOSCOPY (EGD) WITH PROPOFOL;  Surgeon: Lin Landsman, MD;  Location: [facility] ENDOSCOPY;  Service: Gastroenterology;  Laterality: N/A;   KIDNEY STONE SURGERY     LITHOTRIPSY  03/2006   TONSILECTOMY, ADENOIDECTOMY, BILATERAL MYRINGOTOMY AND TUBES     myringotomy tubes   TOTAL LAPAROSCOPIC HYSTERECTOMY WITH SALPINGECTOMY Bilateral 01/14/2017   Procedure: HYSTERECTOMY TOTAL LAPAROSCOPIC WITH SALPINGECTOMY;  Surgeon: Malachy Mood, MD;  Location: [facility] ORS;  Service: Gynecology;  Laterality: Bilateral;   Social History   Tobacco Use   Smoking status: Never   Smokeless tobacco: Never  Vaping Use   Vaping Use: Never used  Substance Use Topics   Alcohol use: No    Alcohol/week: 0.0 standard drinks   Drug use: No   Family History  Problem Relation Age of Onset   Migraines Mother    Breast cancer Paternal Aunt        Contact   Cancer Maternal Grandmother 50       brain tumor/breast cancer   Breast cancer Maternal Grandmother    Heart disease Maternal Grandfather        heart problems   Colon cancer Neg Hx    Esophageal cancer Neg Hx    Liver disease Neg Hx    Pancreatic  cancer Neg Hx    Stomach cancer Neg Hx    Inflammatory bowel disease Neg Hx    Allergies  Allergen Reactions   Amoxicillin Anaphylaxis   Bee Venom Anaphylaxis   Sulfa Antibiotics Anaphylaxis   Sulfonamide Derivatives Anaphylaxis    Lungs close up   Penicillins Rash    Has patient had a PCN reaction causing immediate rash, facial/tongue/throat swelling, SOB or lightheadedness with hypotension: No Has patient had a PCN reaction causing severe rash involving mucus membranes or skin necrosis: No Has patient had a PCN reaction that required hospitalization:  No Has patient had a PCN reaction occurring within the last 10 years: No If all of the above answers are "NO", then may proceed with Cephalosporin use.    Current Outpatient Medications on File Prior to Visit  Medication Sig Dispense Refill   acetaminophen (TYLENOL) 500 MG tablet Take 500 mg by mouth every 6 (six) hours as needed.     budesonide-formoterol (SYMBICORT) 160-4.5 MCG/ACT inhaler Inhale 2 puffs into the lungs 2 (two) times daily. 1 each 6   Cholecalciferol (D3-1000 PO) Take by mouth.     montelukast (SINGULAIR) 10 MG tablet Take 1 tablet (10 mg total) by mouth daily. 90 tablet 3   [START ON 06/23/2021] Tiotropium Bromide Monohydrate (SPIRIVA RESPIMAT) 2.5 MCG/ACT AERS Inhale 2 puffs into the lungs daily. 4 g 11   No current facility-administered medications on file prior to visit.    Review of Systems  Constitutional:  Negative for activity change, appetite change, fatigue, fever and unexpected weight change.  HENT:  Negative for congestion, ear pain, rhinorrhea, sinus pressure and sore throat.   Eyes:  Negative for pain, redness and visual disturbance.  Respiratory:  Positive for cough. Negative for shortness of breath and wheezing.   Cardiovascular:  Negative for chest pain and palpitations.  Gastrointestinal:  Negative for abdominal pain, blood in stool, constipation and diarrhea.       Still occ has RUQ pain    Endocrine: Negative for polydipsia and polyuria.  Genitourinary:  Negative for dysuria, frequency and urgency.  Musculoskeletal:  Negative for arthralgias, back pain and myalgias.  Skin:  Negative for pallor and rash.  Allergic/Immunologic: Negative for environmental allergies.  Neurological:  Negative for dizziness, syncope and headaches.  Hematological:  Negative for adenopathy. Does not bruise/bleed easily.  Psychiatric/Behavioral:  Negative for decreased concentration and dysphoric mood. The patient is not nervous/anxious.       Objective:   Physical Exam Constitutional:      General: She is not in acute distress.    Appearance: Normal appearance. She is well-developed and normal weight. She is not ill-appearing or diaphoretic.  HENT:     Head: Normocephalic and atraumatic.     Right Ear: Tympanic membrane, ear canal and external ear normal.     Left Ear: Tympanic membrane, ear canal and external ear normal.     Nose: Nose normal. No congestion.     Mouth/Throat:     Mouth: Mucous membranes are moist.     Pharynx: Oropharynx is clear. No posterior oropharyngeal erythema.  Eyes:     General: No scleral icterus.    Extraocular Movements: Extraocular movements intact.     Conjunctiva/sclera: Conjunctivae normal.     Pupils: Pupils are equal, round, and reactive to light.  Neck:     Thyroid: No thyromegaly.     Vascular: No carotid bruit or JVD.  Cardiovascular:     Rate and Rhythm: Normal rate and regular rhythm.     Pulses: Normal pulses.     Heart sounds: Normal heart sounds.    No gallop.  Pulmonary:     Effort: Pulmonary effort is normal. No respiratory distress.     Breath sounds: Normal breath sounds. No wheezing.     Comments: Good air exch Chest:     Chest wall: No tenderness.  Abdominal:     General: Bowel sounds are normal. There is no distension or abdominal bruit.     Palpations: Abdomen is soft. There is no mass.     Tenderness:  There is no abdominal  tenderness.     Hernia: No hernia is present.  Genitourinary:    Comments: Breast and pelvic exam are done by gyn provider Musculoskeletal:        General: No tenderness. Normal range of motion.     Cervical back: Normal range of motion and neck supple. No rigidity. No muscular tenderness.     Right lower leg: No edema.     Left lower leg: No edema.  Lymphadenopathy:     Cervical: No cervical adenopathy.  Skin:    General: Skin is warm and dry.     Coloration: Skin is not pale.     Findings: No erythema or rash.     Comments: Solar lentigines diffusely Fair   Neurological:     Mental Status: She is alert. Mental status is at baseline.     Cranial Nerves: No cranial nerve deficit.     Motor: No abnormal muscle tone.     Coordination: Coordination normal.     Gait: Gait normal.     Deep Tendon Reflexes: Reflexes are normal and symmetric. Reflexes normal.  Psychiatric:        Mood and Affect: Mood normal.        Cognition and Memory: Cognition and memory normal.          Assessment & Plan:   Problem List Items Addressed This Visit       Other   Chronic cough    Cough variant asthma  Sees pulmonary singulair symbicort  spiriva      Elevated glucose level    Lab Results  Component Value Date   HGBA1C 5.1 06/10/2021  Good habits and diet  disc imp of low glycemic diet and wt loss to prevent DM2       Low HDL (under 40)    Disc goals for lipids and reasons to control them Rev last labs with pt Rev low sat fat diet in detail Improved Enc to keep up the good diet and exercise       Routine general medical examination at a health care facility - Primary    Reviewed health habits including diet and exercise and skin cancer prevention Reviewed appropriate screening tests for age  Also reviewed health mt list, fam hx and immunization status , as well as social and family history   See HPI Labs reviewed  Mammogram is scheduled 07/07/21 Sees gyn also   Colonoscopy utd imms utd (cannot take the covid imm)  Good health habits

## 2021-06-17 NOTE — Assessment & Plan Note (Signed)
Lab Results  ?Component Value Date  ? HGBA1C 5.1 06/10/2021  ? ?Good habits and diet  ?disc imp of low glycemic diet and wt loss to prevent DM2  ?

## 2021-06-17 NOTE — Assessment & Plan Note (Signed)
Disc goals for lipids and reasons to control them ?Rev last labs with pt ?Rev low sat fat diet in detail ?Improved ?Enc to keep up the good diet and exercise  ?

## 2021-07-07 ENCOUNTER — Ambulatory Visit (INDEPENDENT_AMBULATORY_CARE_PROVIDER_SITE_OTHER): Payer: BC Managed Care – PPO | Admitting: Obstetrics and Gynecology

## 2021-07-07 ENCOUNTER — Ambulatory Visit
Admission: RE | Admit: 2021-07-07 | Discharge: 2021-07-07 | Disposition: A | Discharge status: Home / Self Care | Payer: BC Managed Care – PPO | Source: Ambulatory Visit | Attending: Family Medicine | Admitting: Family Medicine

## 2021-07-07 ENCOUNTER — Other Ambulatory Visit: Payer: Self-pay

## 2021-07-07 ENCOUNTER — Encounter: Payer: Self-pay | Admitting: Obstetrics and Gynecology

## 2021-07-07 VITALS — BP 90/60 | Ht 64.0 in | Wt 137.0 lb

## 2021-07-07 DIAGNOSIS — Z1231 Encounter for screening mammogram for malignant neoplasm of breast: Secondary | ICD-10-CM

## 2021-07-07 DIAGNOSIS — N941 Unspecified dyspareunia: Secondary | ICD-10-CM

## 2021-07-07 DIAGNOSIS — Z01419 Encounter for gynecological examination (general) (routine) without abnormal findings: Secondary | ICD-10-CM | POA: Diagnosis not present

## 2021-07-07 DIAGNOSIS — N393 Stress incontinence (female) (male): Secondary | ICD-10-CM

## 2021-07-07 DIAGNOSIS — N8189 Other female genital prolapse: Secondary | ICD-10-CM | POA: Diagnosis not present

## 2021-07-07 DIAGNOSIS — N898 Other specified noninflammatory disorders of vagina: Secondary | ICD-10-CM

## 2021-07-07 DIAGNOSIS — Z803 Family history of malignant neoplasm of breast: Secondary | ICD-10-CM | POA: Diagnosis not present

## 2021-07-07 NOTE — Progress Notes (Signed)
? ?PCP: Tower, Wynelle Fanny, MD ? ? ?Chief Complaint  ?Patient presents with  ? Gynecologic Exam  ?  Vaginal burning and itching   ? ? ?HPI: ?     [name] is a 51 y.o. [id number] whose LMP was Patient's last menstrual period was 01/08/2017., presents today for her annual examination.  Her menses are absent due to total lap hyst with salpingectomy 2018 with Dr. Georgianne Fick for AUB/leio. No PMB. Doing well. She does have vasomotor sx.  ? ?Sex activity: single partner, contraception - status post hysterectomy. She does have vaginal dryness and pain, hasn't used lubricants. ? ?Pt also with chronic vaginal itching, sometimes with increased d/c. Using dove sens skin soap, uses dryer sheets, wearing pads daily for SUI, using synthetic underwear. Has had neg yeast cultures in past, no response with meds given to pt including nystatin and terconazole (per chart, pt didn't remember names). ? ?Pt also with SUI, has chronic mild cough. Wears pantyliners. Hasn't tried kegel exercises.  ? ?Last Pap: 11/25/16  Results were: no abnormalities /neg HPV DNA.  ?Hx of STDs: none ? ?Last mammogram: today; awaiting results; 07/05/20  Results were: normal--routine follow-up in 12 months ?There is a FH of breast cancer in her MGM and pat aunt, genetic testing not indicated for pt. There is no FH of ovarian cancer. The patient does do self-breast exams. ? ?Colonoscopy: 2019 with Dr. Marius Ditch; Repeat due after 10 years.  ? ?Tobacco use: The patient denies current or previous tobacco use. ?Alcohol use: none ?No drug use. ?Exercise: moderately active ? ?She does get adequate calcium and Vitamin D in her diet. ? ?Labs with PCP. ? ? ?Patient Active Problem List  ? Diagnosis Date Noted  ? SUI (stress urinary incontinence, female) 07/07/2021  ? Encounter for screening mammogram for breast cancer 04/30/2021  ? Asthma 11/12/2020  ? DOE (dyspnea on exertion) 07/09/2020  ? Allergic rhinitis 12/25/2019  ? Vaccine reaction 07/26/2019  ? Perimenopause  06/08/2018  ? Low HDL (under 40) 06/08/2018  ? [place] intestinal bacterial overgrowth 02/09/2018  ? Cyst of right kidney 06/04/2017  ? RUQ abdominal pain 05/31/2017  ? Elevated glucose level 05/22/2017  ? S/P laparoscopic hysterectomy 01/14/2017  ? Abdominal pain 07/27/2016  ? Daily headache 06/01/2016  ? Chronic cough 06/01/2016  ? Encounter for routine gynecological examination 05/23/2014  ? Burning sensation of mouth 09/12/2013  ? Elevated transaminase level 01/13/2011  ? Routine general medical examination at a health care facility 01/05/2011  ? KIDNEY STONE 03/27/2010  ? Atypical chest pain 07/18/2008  ? STRESS REACTION, ACUTE, WITH EMOTIONAL DISTURBANCE 09/13/2007  ? HEADACHE 09/13/2007  ? ? ?Past Surgical History:  ?Procedure Laterality Date  ? ABDOMINAL HYSTERECTOMY    ? CHOLECYSTECTOMY  06/1994  ? COLONOSCOPY WITH PROPOFOL N/A 07/12/2017  ? Procedure: COLONOSCOPY WITH PROPOFOL;  Surgeon: Lin Landsman, MD;  Location: [facility] ENDOSCOPY;  Service: Gastroenterology;  Laterality: N/A;  ? CYSTOSCOPY N/A 01/14/2017  ? Procedure: CYSTOSCOPY;  Surgeon: Malachy Mood, MD;  Location: [facility] ORS;  Service: Gynecology;  Laterality: N/A;  ? ESOPHAGOGASTRODUODENOSCOPY (EGD) WITH PROPOFOL N/A 07/12/2017  ? Procedure: ESOPHAGOGASTRODUODENOSCOPY (EGD) WITH PROPOFOL;  Surgeon: Lin Landsman, MD;  Location: [facility] ENDOSCOPY;  Service: Gastroenterology;  Laterality: N/A;  ? KIDNEY STONE SURGERY    ? LITHOTRIPSY  03/2006  ? TONSILECTOMY, ADENOIDECTOMY, BILATERAL MYRINGOTOMY AND TUBES    ? myringotomy tubes  ? TOTAL LAPAROSCOPIC HYSTERECTOMY WITH SALPINGECTOMY Bilateral 01/14/2017  ? Procedure: HYSTERECTOMY TOTAL LAPAROSCOPIC WITH SALPINGECTOMY;  Surgeon: Malachy Mood, MD;  Location: [facility] ORS;  Service: Gynecology;  Laterality: Bilateral;  ? ? ?Family History  ?Problem Relation Age of Onset  ? Migraines Mother   ? Breast cancer Paternal Aunt   ?     Contact  ? Cancer Maternal Grandmother 46  ?     brain tumor/breast  cancer  ? Breast cancer Maternal Grandmother   ? Heart disease Maternal Grandfather   ?     heart problems  ? Colon cancer Neg Hx   ? Esophageal cancer Neg Hx   ? Liver disease Neg Hx   ? Pancreatic cancer Neg Hx   ? Stomach cancer Neg Hx   ? Inflammatory bowel disease Neg Hx   ? ? ?Social History  ? ?Socioeconomic History  ? Marital status: Married  ?  Spouse name: Not on file  ? Number of children: 2  ? Years of education: Not on file  ? Highest education level: Not on file  ?Occupational History  ? Occupation: insurance  ?  Employer: [employer]  ?Tobacco Use  ? Smoking status: Never  ? Smokeless tobacco: Never  ?Vaping Use  ? Vaping Use: Never used  ?Substance and Sexual Activity  ? Alcohol use: No  ?  Alcohol/week: 0.0 standard drinks  ? Drug use: No  ? Sexual activity: Yes  ?  Birth control/protection: Surgical  ?  Comment: Hysterectomy  ?Other Topics Concern  ? Not on file  ?Social History Narrative  ? 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, she is an Theatre manager  ? Rare caffeine and never smoker no alcohol no drug or substance use  ? ?Social Determinants of Health  ? ?Financial Resource Strain: Not on file  ?Food Insecurity: Not on file  ?Transportation Needs: Not on file  ?Physical Activity: Not on file  ?Stress: Not on file  ?Social Connections: Not on file  ?Intimate Partner Violence: Not on file  ? ? ? ?Current Outpatient Medications:  ?  acetaminophen (TYLENOL) 500 MG tablet, Take 500 mg by mouth every 6 (six) hours as needed., Disp: , Rfl:  ?  budesonide-formoterol (SYMBICORT) 160-4.5 MCG/ACT inhaler, Inhale 2 puffs into the lungs 2 (two) times daily., Disp: 1 each, Rfl: 6 ?  Cholecalciferol (D3-1000 PO), Take by mouth., Disp: , Rfl:  ?  montelukast (SINGULAIR) 10 MG tablet, Take 1 tablet (10 mg total) by mouth daily., Disp: 90 tablet, Rfl: 3 ?  Tiotropium Bromide Monohydrate (SPIRIVA RESPIMAT) 2.5 MCG/ACT AERS, Inhale 2 puffs into the lungs daily., Disp: 4 g, Rfl:  11 ? ? ? ? ?ROS: ? ?Review of Systems  ?Constitutional:  Negative for fatigue, fever and unexpected weight change.  ?Respiratory:  Positive for cough. Negative for shortness of breath and wheezing.   ?Cardiovascular:  Negative for chest pain, palpitations and leg swelling.  ?Gastrointestinal:  Negative for blood in stool, constipation, diarrhea, nausea and vomiting.  ?Endocrine: Negative for cold intolerance, heat intolerance and polyuria.  ?Genitourinary:  Positive for dyspareunia. Negative for dysuria, flank pain, frequency, genital sores, hematuria, menstrual problem, pelvic pain, urgency, vaginal bleeding, vaginal discharge and vaginal pain.  ?Musculoskeletal:  Negative for back pain, joint swelling and myalgias.  ?Skin:  Negative for rash.  ?Neurological:  Negative for dizziness, syncope, light-headedness, numbness and headaches.  ?Hematological:  Negative for adenopathy.  ?Psychiatric/Behavioral:  Negative for agitation, confusion, sleep disturbance and suicidal ideas. The patient is not nervous/anxious.   ?BREAST: No symptoms ? ? ? ?Objective: ?  BP 90/60   Ht '5\' 4"'$  (1.626 m)   Wt 137 lb (62.1 kg)   LMP 01/08/2017   BMI 23.52 kg/m?  ? ? ?Physical Exam ?Constitutional:   ?   Appearance: She is well-developed.  ?Genitourinary:  ?   Vulva normal.  ?   Genitourinary Comments: UTERUS/CX SURG REM; MILD CYSTOCELE AND RECTOCELE ON EXAM  ?   Right Labia: No rash, tenderness or lesions. ?   Left Labia: No tenderness, lesions or rash. ?   Vaginal cuff intact. ?   No vaginal discharge, erythema or tenderness.  ?   Anterior and posterior vaginal prolapse present. ?   No vaginal atrophy present. ? ?   Right Adnexa: not tender and no mass present. ?   Left Adnexa: not tender and no mass present. ?   Cervix is absent.  ?   Uterus is absent.  ?Breasts: ?   Right: No mass, nipple discharge, skin change or tenderness.  ?   Left: No mass, nipple discharge, skin change or tenderness.  ?Neck:  ?   Thyroid: No thyromegaly.   ?Cardiovascular:  ?   Rate and Rhythm: Normal rate and regular rhythm.  ?   Heart sounds: Normal heart sounds. No murmur heard. ?Pulmonary:  ?   Effort: Pulmonary effort is normal.  ?   Breath sounds: Normal breath soun

## 2021-07-07 NOTE — Patient Instructions (Signed)
I value your feedback and you entrusting us with your care. If you get a [facility] patient survey, I would appreciate you taking the time to let us know about your experience today. Thank you! ? ? ?

## 2021-07-08 ENCOUNTER — Other Ambulatory Visit: Payer: Self-pay | Admitting: Family Medicine

## 2021-07-08 DIAGNOSIS — R928 Other abnormal and inconclusive findings on diagnostic imaging of breast: Secondary | ICD-10-CM

## 2021-07-08 DIAGNOSIS — N6489 Other specified disorders of breast: Secondary | ICD-10-CM

## 2021-07-11 ENCOUNTER — Encounter: Payer: Self-pay | Admitting: Obstetrics and Gynecology

## 2021-07-11 ENCOUNTER — Encounter: Payer: Self-pay | Admitting: Family Medicine

## 2021-07-15 ENCOUNTER — Encounter: Payer: Self-pay | Admitting: Family Medicine

## 2021-07-15 ENCOUNTER — Ambulatory Visit: Payer: BC Managed Care – PPO | Admitting: Family Medicine

## 2021-07-15 DIAGNOSIS — S46911A Strain of unspecified muscle, fascia and tendon at shoulder and upper arm level, right arm, initial encounter: Secondary | ICD-10-CM

## 2021-07-15 DIAGNOSIS — S46919A Strain of unspecified muscle, fascia and tendon at shoulder and upper arm level, unspecified arm, initial encounter: Secondary | ICD-10-CM | POA: Insufficient documentation

## 2021-07-15 MED ORDER — MELOXICAM 15 MG PO TABS: 15.0000 mg | ORAL_TABLET | Freq: Every day | ORAL | 1 refills | Status: DC | PRN

## 2021-07-15 NOTE — Progress Notes (Signed)
? ?Subjective:  ? ? Patient ID: Courtney Pineda, female    DOB: 07-28-1970, 51 y.o.   MRN: [id number] ? ?HPI ?Pt presents for R shoulder pain  ? ?Wt Readings from Last 3 Encounters:  ?07/15/21 135 lb 4 oz (61.3 kg)  ?07/07/21 137 lb (62.1 kg)  ?06/17/21 135 lb 4 oz (61.3 kg)  ? ?23.22 kg/m? ? ?Thursday was lifting weights ?Felt a pop in R shoulder  ?Hurts to the touch  ?Hurts to lie on it  ?No warmth or swelling  ?Stiff  ?Can still lift arm over her head  ? ?Top of shoulder is most sore  ? ?No ice/heat or analgesics  ? ?Patient Active Problem List  ? Diagnosis Date Noted  ? Shoulder strain 07/15/2021  ? SUI (stress urinary incontinence, female) 07/07/2021  ? Encounter for screening mammogram for breast cancer 04/30/2021  ? Asthma 11/12/2020  ? DOE (dyspnea on exertion) 07/09/2020  ? Allergic rhinitis 12/25/2019  ? Vaccine reaction 07/26/2019  ? Perimenopause 06/08/2018  ? Low HDL (under 40) 06/08/2018  ? [place] intestinal bacterial overgrowth 02/09/2018  ? Cyst of right kidney 06/04/2017  ? RUQ abdominal pain 05/31/2017  ? Elevated glucose level 05/22/2017  ? S/P laparoscopic hysterectomy 01/14/2017  ? Abdominal pain 07/27/2016  ? Daily headache 06/01/2016  ? Chronic cough 06/01/2016  ? Encounter for routine gynecological examination 05/23/2014  ? Burning sensation of mouth 09/12/2013  ? Elevated transaminase level 01/13/2011  ? Routine general medical examination at a health care facility 01/05/2011  ? KIDNEY STONE 03/27/2010  ? Atypical chest pain 07/18/2008  ? STRESS REACTION, ACUTE, WITH EMOTIONAL DISTURBANCE 09/13/2007  ? HEADACHE 09/13/2007  ? ?Past Medical History:  ?Diagnosis Date  ? Anemia   ? Chronic right upper quadrant pain   ? Dysplastic nevus 09/16/2015  ? left infra axillary  ? Dysplastic nevus 10/27/2017  ? left side  ? History of kidney stones   ? Hyperemesis gravidarum 1999  ? [place] intestinal bacterial overgrowth 02/09/2018  ? ?Past Surgical History:  ?Procedure Laterality Date  ? ABDOMINAL  HYSTERECTOMY    ? CHOLECYSTECTOMY  06/1994  ? COLONOSCOPY WITH PROPOFOL N/A 07/12/2017  ? Procedure: COLONOSCOPY WITH PROPOFOL;  Surgeon: Lin Landsman, MD;  Location: [facility] ENDOSCOPY;  Service: Gastroenterology;  Laterality: N/A;  ? CYSTOSCOPY N/A 01/14/2017  ? Procedure: CYSTOSCOPY;  Surgeon: Malachy Mood, MD;  Location: [facility] ORS;  Service: Gynecology;  Laterality: N/A;  ? ESOPHAGOGASTRODUODENOSCOPY (EGD) WITH PROPOFOL N/A 07/12/2017  ? Procedure: ESOPHAGOGASTRODUODENOSCOPY (EGD) WITH PROPOFOL;  Surgeon: Lin Landsman, MD;  Location: [facility] ENDOSCOPY;  Service: Gastroenterology;  Laterality: N/A;  ? KIDNEY STONE SURGERY    ? LITHOTRIPSY  03/2006  ? TONSILECTOMY, ADENOIDECTOMY, BILATERAL MYRINGOTOMY AND TUBES    ? myringotomy tubes  ? TOTAL LAPAROSCOPIC HYSTERECTOMY WITH SALPINGECTOMY Bilateral 01/14/2017  ? Procedure: HYSTERECTOMY TOTAL LAPAROSCOPIC WITH SALPINGECTOMY;  Surgeon: Malachy Mood, MD;  Location: [facility] ORS;  Service: Gynecology;  Laterality: Bilateral;  ? ?Social History  ? ?Tobacco Use  ? Smoking status: Never  ? Smokeless tobacco: Never  ?Vaping Use  ? Vaping Use: Never used  ?Substance Use Topics  ? Alcohol use: No  ?  Alcohol/week: 0.0 standard drinks  ? Drug use: No  ? ?Family History  ?Problem Relation Age of Onset  ? Migraines Mother   ? Cancer Maternal Grandmother 62  ?     brain tumor/breast cancer  ? Breast cancer Maternal Grandmother   ? Heart disease Maternal Grandfather   ?  heart problems  ? Breast cancer Paternal Aunt 53  ?     Contact  ? Colon cancer Neg Hx   ? Esophageal cancer Neg Hx   ? Liver disease Neg Hx   ? Pancreatic cancer Neg Hx   ? Stomach cancer Neg Hx   ? Inflammatory bowel disease Neg Hx   ? ?Allergies  ?Allergen Reactions  ? Amoxicillin Anaphylaxis  ? Bee Venom Anaphylaxis  ? Sulfa Antibiotics Anaphylaxis  ? Sulfonamide Derivatives Anaphylaxis  ?  Lungs close up  ? Covid-19 (Mrna) Vaccine   ?  J and J ?Local reaction   ? Penicillins Rash  ?  Has patient had  a PCN reaction causing immediate rash, facial/tongue/throat swelling, SOB or lightheadedness with hypotension: No ?Has patient had a PCN reaction causing severe rash involving mucus membranes or skin necrosis: No ?Has patient had a PCN reaction that required hospitalization: No ?Has patient had a PCN reaction occurring within the last 10 years: No ?If all of the above answers are "NO", then may proceed with Cephalosporin use. ?  ? ?Current Outpatient Medications on File Prior to Visit  ?Medication Sig Dispense Refill  ? acetaminophen (TYLENOL) 500 MG tablet Take 500 mg by mouth every 6 (six) hours as needed.    ? budesonide-formoterol (SYMBICORT) 160-4.5 MCG/ACT inhaler Inhale 2 puffs into the lungs 2 (two) times daily. 1 each 6  ? Cholecalciferol (D3-1000 PO) Take by mouth.    ? montelukast (SINGULAIR) 10 MG tablet Take 1 tablet (10 mg total) by mouth daily. 90 tablet 3  ? Tiotropium Bromide Monohydrate (SPIRIVA RESPIMAT) 2.5 MCG/ACT AERS Inhale 2 puffs into the lungs daily. 4 g 11  ? ?No current facility-administered medications on file prior to visit.  ?  ?Review of Systems  ?Constitutional:  Negative for activity change, appetite change, fatigue, fever and unexpected weight change.  ?HENT:  Negative for congestion, ear pain, rhinorrhea, sinus pressure and sore throat.   ?Eyes:  Negative for pain, redness and visual disturbance.  ?Respiratory:  Negative for cough, shortness of breath and wheezing.   ?Cardiovascular:  Negative for chest pain and palpitations.  ?Gastrointestinal:  Negative for abdominal pain, blood in stool, constipation and diarrhea.  ?     Chronic right abd pain   ?Endocrine: Negative for polydipsia and polyuria.  ?Genitourinary:  Negative for dysuria, frequency and urgency.  ?Musculoskeletal:  Negative for arthralgias, back pain and myalgias.  ?     Right shoulder pain   ?Skin:  Negative for pallor and rash.  ?Allergic/Immunologic: Negative for environmental allergies.  ?Neurological:  Negative  for dizziness, syncope and headaches.  ?Hematological:  Negative for adenopathy. Does not bruise/bleed easily.  ?Psychiatric/Behavioral:  Negative for decreased concentration and dysphoric mood. The patient is not nervous/anxious.   ? ?   ?Objective:  ? Physical Exam ?Constitutional:   ?   General: She is not in acute distress. ?   Appearance: Normal appearance. She is normal weight. She is not ill-appearing.  ?Musculoskeletal:  ?   Comments: Shoulder (right) ?No deformity/swelling/warmth or erythema  ?No crepitus  ?No obvious effusion  ?Not unstable ?Abduction -full with some discomort  ?Hawking test -causes discomfort  ?Neer test -causes discomfort  ?Internal rotation -limited by pain  ?External rotation -full with some discomfort  ?Tenderness : some tenderness over AC joint area  ?Normal grip and hand dexterity ?  ?  ?Skin: ?   Findings: No bruising, erythema or rash.  ?Neurological:  ?   Mental  Status: She is alert.  ?   Sensory: No sensory deficit.  ?   Motor: No weakness.  ?Psychiatric:     ?   Mood and Affect: Mood normal.  ? ? ? ? ? ?   ?Assessment & Plan:  ? ?Problem List Items Addressed This Visit   ? ?  ? Musculoskeletal and Integument  ? Shoulder strain  ?  Occurred with overhead resistance work  ?Reassuring exam/ with some rotator cuff signs  ?Suspect tendon injury  ?Adv use of ice and relative rest  ?meloxicam 15 mg daily prn  ?Reviewed shoulder rom exercises  ?F/u if not imp in 2 wk/consider sport med ?Handouts given  ?  ?  ? ? ?

## 2021-07-15 NOTE — Patient Instructions (Addendum)
Use ice on your shoulder any chance you get 10 minutes at a time  ? ?Take meloxicam 15 mg daily with food  ? ?Try the passive range of motion exercises  ? ?Update [place] if not much improved in 2 weeks  ? ? ?

## 2021-07-15 NOTE — Assessment & Plan Note (Signed)
Occurred with overhead resistance work  ?Reassuring exam/ with some rotator cuff signs  ?Suspect tendon injury  ?Adv use of ice and relative rest  ?meloxicam 15 mg daily prn  ?Reviewed shoulder rom exercises  ?F/u if not imp in 2 wk/consider sport med ?Handouts given  ?

## 2021-07-25 ENCOUNTER — Telehealth: Payer: Self-pay

## 2021-07-25 ENCOUNTER — Encounter: Payer: Self-pay | Admitting: Family Medicine

## 2021-07-25 NOTE — Telephone Encounter (Signed)
Vitamin D3

## 2021-07-25 NOTE — Telephone Encounter (Signed)
Patient sent mychart message with the information below. I did respond to the patient to let her know Dr Glori Bickers is out of the office and it may be next week before we can respond. ? ?"Dr. Glori Bickers, ?  ?In regards to the Vitamin D, should I take Vitamin D or Vitamin D3?  I have been taking Calcium+D3 since last year but I know you said to take Vitamin D so I wanted to check and see what I should be taking." ?

## 2021-07-25 NOTE — Telephone Encounter (Signed)
Sent mychart message letting pt know to take Vit D3 ?

## 2021-07-30 ENCOUNTER — Ambulatory Visit
Admission: RE | Admit: 2021-07-30 | Discharge: 2021-07-30 | Disposition: A | Discharge status: Home / Self Care | Payer: BC Managed Care – PPO | Source: Ambulatory Visit | Attending: Family Medicine | Admitting: Family Medicine

## 2021-07-30 DIAGNOSIS — R928 Other abnormal and inconclusive findings on diagnostic imaging of breast: Secondary | ICD-10-CM

## 2021-07-30 DIAGNOSIS — N6489 Other specified disorders of breast: Secondary | ICD-10-CM

## 2021-09-12 ENCOUNTER — Ambulatory Visit: Payer: BC Managed Care – PPO | Admitting: Pulmonary Disease

## 2021-11-01 ENCOUNTER — Encounter: Payer: Self-pay | Admitting: Family Medicine

## 2021-11-03 ENCOUNTER — Encounter: Payer: Self-pay | Admitting: Family Medicine

## 2021-11-03 ENCOUNTER — Telehealth (INDEPENDENT_AMBULATORY_CARE_PROVIDER_SITE_OTHER): Payer: BC Managed Care – PPO | Admitting: Family Medicine

## 2021-11-03 VITALS — BP 103/60 | HR 51 | Temp 98.2°F | Wt 134.0 lb

## 2021-11-03 DIAGNOSIS — J069 Acute upper respiratory infection, unspecified: Secondary | ICD-10-CM

## 2021-11-03 NOTE — Progress Notes (Signed)
Virtual Visit via Telephone Note  I connected with Courtney Pineda on 11/03/21 at 12:00 PM EDT by telephone and verified that I am speaking with the correct person using two identifiers.  Location: Patient: home Provider: office    I discussed the limitations, risks, security and privacy concerns of performing an evaluation and management service by telephone and the availability of in person appointments. I also discussed with the patient that there may be a patient responsible charge related to this service. The patient expressed understanding and agreed to proceed.  Parties involved in encounter  Patient: Courtney Pineda   Provider:  Loura Pardon MD   History of Present Illness: Pt presents for c/o uri symptoms  Wonders if she has a sinus infection   Thursday night/ Friday am    Runny nose and congestion  Clear mucous   Pain in face behind eyes  A little tender to self palp below eyes   Eyes feel heavy  Cough - bad taste to the mucous  Clear mucous  No wheezing at this point but she did have some chest tightness  Uses the symbicort as needed (she has not needed it regularly recent)   No fever at all  No aches or chills   ST from pnd / scratchy   Covid test at home is negative   Has a h/o asthma  (cough variant) and has seen pulmonary  Singulair- takes daily  Symbicort-prn Spiriva-has not been using    Otc  Wants to know if she can take mucinex - has [employer] generic of mucinex DM  Has not used saline ns in a long time    Patient Active Problem List   Diagnosis Date Noted   Shoulder strain 07/15/2021   SUI (stress urinary incontinence, female) 07/07/2021   Encounter for screening mammogram for breast cancer 04/30/2021   Asthma 11/12/2020   DOE (dyspnea on exertion) 07/09/2020   Allergic rhinitis 12/25/2019   Vaccine reaction 07/26/2019   Perimenopause 06/08/2018   Low HDL (under 40) 06/08/2018   [place] intestinal bacterial overgrowth 02/09/2018    Viral URI with cough 06/10/2017   Cyst of right kidney 06/04/2017   RUQ abdominal pain 05/31/2017   Elevated glucose level 05/22/2017   S/P laparoscopic hysterectomy 01/14/2017   Abdominal pain 07/27/2016   Daily headache 06/01/2016   Chronic cough 06/01/2016   Encounter for routine gynecological examination 05/23/2014   Burning sensation of mouth 09/12/2013   Elevated transaminase level 01/13/2011   Routine general medical examination at a health care facility 01/05/2011   KIDNEY STONE 03/27/2010   Atypical chest pain 07/18/2008   STRESS REACTION, ACUTE, WITH EMOTIONAL DISTURBANCE 09/13/2007   HEADACHE 09/13/2007   Past Medical History:  Diagnosis Date   Anemia    Chronic right upper quadrant pain    Dysplastic nevus 09/16/2015   left infra axillary   Dysplastic nevus 10/27/2017   left side   History of kidney stones    Hyperemesis gravidarum 1999   [place] intestinal bacterial overgrowth 02/09/2018   Past Surgical History:  Procedure Laterality Date   ABDOMINAL HYSTERECTOMY     CHOLECYSTECTOMY  06/1994   COLONOSCOPY WITH PROPOFOL N/A 07/12/2017   Procedure: COLONOSCOPY WITH PROPOFOL;  Surgeon: Lin Landsman, MD;  Location: [facility] ENDOSCOPY;  Service: Gastroenterology;  Laterality: N/A;   CYSTOSCOPY N/A 01/14/2017   Procedure: CYSTOSCOPY;  Surgeon: Malachy Mood, MD;  Location: [facility] ORS;  Service: Gynecology;  Laterality: N/A;   ESOPHAGOGASTRODUODENOSCOPY (EGD) WITH PROPOFOL N/A  07/12/2017   Procedure: ESOPHAGOGASTRODUODENOSCOPY (EGD) WITH PROPOFOL;  Surgeon: Lin Landsman, MD;  Location: [facility] ENDOSCOPY;  Service: Gastroenterology;  Laterality: N/A;   KIDNEY STONE SURGERY     LITHOTRIPSY  03/2006   TONSILECTOMY, ADENOIDECTOMY, BILATERAL MYRINGOTOMY AND TUBES     myringotomy tubes   TOTAL LAPAROSCOPIC HYSTERECTOMY WITH SALPINGECTOMY Bilateral 01/14/2017   Procedure: HYSTERECTOMY TOTAL LAPAROSCOPIC WITH SALPINGECTOMY;  Surgeon: Malachy Mood, MD;  Location:  [facility] ORS;  Service: Gynecology;  Laterality: Bilateral;   Social History   Tobacco Use   Smoking status: Never   Smokeless tobacco: Never  Vaping Use   Vaping Use: Never used  Substance Use Topics   Alcohol use: No    Alcohol/week: 0.0 standard drinks of alcohol   Drug use: No   Family History  Problem Relation Age of Onset   Migraines Mother    Cancer Maternal Grandmother 68       brain tumor/breast cancer   Breast cancer Maternal Grandmother    Heart disease Maternal Grandfather        heart problems   Breast cancer Paternal Aunt 77       Contact   Colon cancer Neg Hx    Esophageal cancer Neg Hx    Liver disease Neg Hx    Pancreatic cancer Neg Hx    Stomach cancer Neg Hx    Inflammatory bowel disease Neg Hx    Allergies  Allergen Reactions   Amoxicillin Anaphylaxis   Bee Venom Anaphylaxis   Sulfa Antibiotics Anaphylaxis   Sulfonamide Derivatives Anaphylaxis    Lungs close up   Covid-19 (Mrna) Vaccine     J and J Local reaction    Penicillins Rash    Has patient had a PCN reaction causing immediate rash, facial/tongue/throat swelling, SOB or lightheadedness with hypotension: No Has patient had a PCN reaction causing severe rash involving mucus membranes or skin necrosis: No Has patient had a PCN reaction that required hospitalization: No Has patient had a PCN reaction occurring within the last 10 years: No If all of the above answers are "NO", then may proceed with Cephalosporin use.    Current Outpatient Medications on File Prior to Visit  Medication Sig Dispense Refill   acetaminophen (TYLENOL) 500 MG tablet Take 500 mg by mouth every 6 (six) hours as needed.     budesonide-formoterol (SYMBICORT) 160-4.5 MCG/ACT inhaler Inhale 2 puffs into the lungs 2 (two) times daily. 1 each 6   Cholecalciferol (D3-1000 PO) Take by mouth.     meloxicam (MOBIC) 15 MG tablet Take 1 tablet (15 mg total) by mouth daily as needed for pain. With food 30 tablet 1   montelukast  (SINGULAIR) 10 MG tablet Take 1 tablet (10 mg total) by mouth daily. 90 tablet 3   Tiotropium Bromide Monohydrate (SPIRIVA RESPIMAT) 2.5 MCG/ACT AERS Inhale 2 puffs into the lungs daily. 4 g 11   No current facility-administered medications on file prior to visit.   Review of Systems  Constitutional:  Negative for chills, fever and malaise/fatigue.  HENT:  Positive for congestion, sinus pain and sore throat. Negative for ear pain.   Eyes:  Negative for blurred vision, discharge and redness.  Respiratory:  Positive for cough and sputum production. Negative for shortness of breath, wheezing and stridor.   Cardiovascular:  Negative for chest pain, palpitations and leg swelling.  Gastrointestinal:  Negative for abdominal pain, diarrhea, nausea and vomiting.  Musculoskeletal:  Negative for myalgias.  Skin:  Negative for rash.  Neurological:  Negative for dizziness and headaches.    Observations/Objective: Pt sounds well  Not distressed  Voice is slightly hoarse Occ clears her throat  No cough or wheezing audible  Nl mood Good historian/nl cognition     Assessment and Plan: Problem List Items Addressed This Visit       Respiratory   Viral URI with cough - Primary    Day 3-4 with congestion/cough/rhinorrhea Uses symbicort for chest tightness /history of cough variant asthma  Neg home covid testing  Reviewed symptom control Rev s/s of bacterial inf to watch for  Update if not starting to improve in a week or if worsening  ER precautions rev        Follow Up Instructions: Drink fluids and rest  Drink fluids and rest  mucinex DM is good for cough and congestion  Nasal saline for congestion as needed  Tylenol for fever or pain or headache  Please alert [place] if symptoms worsen (if severe or short of breath please go to the ER)   Watch for green nasal mucous or worse facial pain , fever or wheezing and let us know   If any severe symptoms please go to the ER   I discussed the  assessment and treatment plan with the patient. The patient was provided an opportunity to ask questions and all were answered. The patient agreed with the plan and demonstrated an understanding of the instructions.   The patient was advised to call back or seek an in-person evaluation if the symptoms worsen or if the condition fails to improve as anticipated.  I provided 17 minutes of non-face-to-face time during this encounter.   Loura Pardon, MD

## 2021-11-03 NOTE — Assessment & Plan Note (Signed)
Day 3-4 with congestion/cough/rhinorrhea Uses symbicort for chest tightness /history of cough variant asthma  Neg home covid testing  Reviewed symptom control Rev s/s of bacterial inf to watch for  Update if not starting to improve in a week or if worsening  ER precautions rev

## 2021-11-03 NOTE — Patient Instructions (Addendum)
Drink fluids and rest  Drink fluids and rest  mucinex DM is good for cough and congestion  Nasal saline for congestion as needed  Tylenol for fever or pain or headache  Please alert [place] if symptoms worsen (if severe or short of breath please go to the ER)   Watch for green nasal mucous or worse facial pain , fever or wheezing and let us know   If any severe symptoms please go to the ER

## 2021-11-03 NOTE — Telephone Encounter (Signed)
Spoke to patient by telephone and was advised that she thinks that she may have an infection and need an antibiotic. Mychart appointment scheduled today 11/03/21 with Dr. Glori Bickers 11/03/21 at 12:00

## 2021-11-06 ENCOUNTER — Ambulatory Visit: Payer: BC Managed Care – PPO | Admitting: Dermatology

## 2021-11-06 DIAGNOSIS — L720 Epidermal cyst: Secondary | ICD-10-CM

## 2021-11-06 DIAGNOSIS — Z1283 Encounter for screening for malignant neoplasm of skin: Secondary | ICD-10-CM

## 2021-11-06 DIAGNOSIS — Z86018 Personal history of other benign neoplasm: Secondary | ICD-10-CM

## 2021-11-06 DIAGNOSIS — L578 Other skin changes due to chronic exposure to nonionizing radiation: Secondary | ICD-10-CM | POA: Diagnosis not present

## 2021-11-06 DIAGNOSIS — D239 Other benign neoplasm of skin, unspecified: Secondary | ICD-10-CM | POA: Diagnosis not present

## 2021-11-06 DIAGNOSIS — L719 Rosacea, unspecified: Secondary | ICD-10-CM

## 2021-11-06 DIAGNOSIS — L821 Other seborrheic keratosis: Secondary | ICD-10-CM

## 2021-11-06 DIAGNOSIS — D1800 Hemangioma unspecified site: Secondary | ICD-10-CM

## 2021-11-06 DIAGNOSIS — L738 Other specified follicular disorders: Secondary | ICD-10-CM

## 2021-11-06 DIAGNOSIS — L814 Other melanin hyperpigmentation: Secondary | ICD-10-CM

## 2021-11-06 DIAGNOSIS — D229 Melanocytic nevi, unspecified: Secondary | ICD-10-CM

## 2021-11-06 NOTE — Progress Notes (Signed)
Follow-Up Visit   Subjective  Courtney Pineda is a 51 y.o. female who presents for the following: Annual Exam (1 year tbse, hx of rosacea ). The patient presents for Total-Body Skin Exam (TBSE) for skin cancer screening and mole check.  The patient has spots, moles and lesions to be evaluated, some may be new or changing and the patient has concerns that these could be cancer.  The following portions of the chart were reviewed this encounter and updated as appropriate:  Tobacco  Allergies  Meds  Problems  Med Hx  Surg Hx  Fam Hx     Review of Systems: No other skin or systemic complaints except as noted in HPI or Assessment and Plan.  Objective  Well appearing patient in no apparent distress; mood and affect are within normal limits.  A full examination was performed including scalp, head, eyes, ears, nose, lips, neck, chest, axillae, abdomen, back, buttocks, bilateral upper extremities, bilateral lower extremities, hands, feet, fingers, toes, fingernails, and toenails. All findings within normal limits unless otherwise noted below.  face Erythema at mid face and nose      Assessment & Plan  Rosacea Face See photos Rosacea is a chronic progressive skin condition usually affecting the face of adults, causing redness and/or acne bumps. It is treatable but not curable. It sometimes affects the eyes (ocular rosacea) as well. It may respond to topical and/or systemic medication and can flare with stress, sun exposure, alcohol, exercise and some foods.  Daily application of broad spectrum spf 30+ sunscreen to face is recommended to reduce flares. Discussed the treatment option of BBL/laser.  Typically we recommend 1-3 treatment sessions about 5-8 weeks apart for best results.  The patient's condition may require "maintenance treatments" in the future.  The fee for BBL / laser treatments is $350 per treatment session for the whole face.  A fee can be quoted for other parts of the  body. Insurance typically does not pay for BBL/laser treatments and therefore the fee is an out-of-pocket cost.  Patient deferred treatment at this time.  Lentigines - Scattered tan macules - Due to sun exposure - Benign-appearing, observe - Recommend daily broad spectrum sunscreen SPF 30+ to sun-exposed areas, reapply every 2 hours as needed. - Call for any changes  Seborrheic Keratoses - Stuck-on, waxy, tan-brown papules and/or plaques  - Benign-appearing - Discussed benign etiology and prognosis. - Observe - Call for any changes  Sebaceous Hyperplasia With milia at chin  - Hollenbach yellow papules with a central dell Benign-appearing.  Observation.  Call clinic for new or changing lesions.  Recommend daily use of broad spectrum spf 30+ sunscreen to sun-exposed areas.   Xerosis - diffuse xerotic patches - recommend gentle, hydrating skin care - gentle skin care handout given  Melanocytic Nevi - Tan-brown and/or pink-flesh-colored symmetric macules and papules - Benign appearing on exam today - Observation - Call clinic for new or changing moles - Recommend daily use of broad spectrum spf 30+ sunscreen to sun-exposed areas.   Hemangiomas - Red papules - Discussed benign nature - Observe - Call for any changes  Actinic Damage - Chronic condition, secondary to cumulative UV/sun exposure - diffuse scaly erythematous macules with underlying dyspigmentation - Recommend daily broad spectrum sunscreen SPF 30+ to sun-exposed areas, reapply every 2 hours as needed.  - Staying in the shade or wearing long sleeves, sun glasses (UVA+UVB protection) and wide brim hats (4-inch brim around the entire circumference of the hat) are also  recommended for sun protection.  - Call for new or changing lesions.  History of Dysplastic Nevi At multiple locations see history  And problem list - No evidence of recurrence today - Recommend regular full body skin exams - Recommend daily broad  spectrum sunscreen SPF 30+ to sun-exposed areas, reapply every 2 hours as needed.  - Call if any new or changing lesions are noted between office visits  Skin cancer screening performed today. Return in about 1 year (around 11/07/2022) for TBSE.  IRuthell Rummage, CMA, am acting as scribe for Sarina Ser, MD. Documentation: I have reviewed the above documentation for accuracy and completeness, and I agree with the above.  Sarina Ser, MD

## 2021-11-06 NOTE — Patient Instructions (Addendum)
Gentle Skin Care Guide  1. Bathe no more than once a day.  2. Avoid bathing in hot water  3. Use a mild soap like Dove, Vanicream, Cetaphil, CeraVe. Can use Lever 2000 or Cetaphil antibacterial soap  4. Use soap only where you need it. On most days, use it under your arms, between your legs, and on your feet. Let the water rinse other areas unless visibly dirty.  5. When you get out of the bath/shower, use a towel to gently blot your skin dry, don't rub it.  6. While your skin is still a little damp, apply a moisturizing cream such as Vanicream, CeraVe, Cetaphil, Eucerin, Sarna lotion or plain Vaseline Jelly. For hands apply Neutrogena [place] Hand Cream or Excipial Hand Cream.  7. Reapply moisturizer any time you start to itch or feel dry.  8. Sometimes using free and clear laundry detergents can be helpful. Fabric softener sheets should be avoided. Downy Free & Gentle liquid, or any liquid fabric softener that is free of dyes and perfumes, it acceptable to use  9. If your doctor has given you prescription creams you may apply moisturizers over them     Melanoma ABCDEs  Melanoma is the most dangerous type of skin cancer, and is the leading cause of death from skin disease.  You are more likely to develop melanoma if you: Have light-colored skin, light-colored eyes, or red or blond hair Spend a lot of time in the sun Tan regularly, either outdoors or in a tanning bed Have had blistering sunburns, especially during childhood Have a close family member who has had a melanoma Have atypical moles or large birthmarks  Early detection of melanoma is key since treatment is typically straightforward and cure rates are extremely high if we catch it early.   The first sign of melanoma is often a change in a mole or a new dark spot.  The ABCDE system is a way of remembering the signs of melanoma.  A for asymmetry:  The two halves do not match. B for border:  The edges of the growth are  irregular. C for color:  A mixture of colors are present instead of an even brown color. D for diameter:  Melanomas are usually (but not always) greater than 64m - the size of a pencil eraser. E for evolution:  The spot keeps changing in size, shape, and color.  Please check your skin once per month between visits. You can use a [place] mirror in front and a large mirror behind you to keep an eye on the back side or your body.   If you see any new or changing lesions before your next follow-up, please call to schedule a visit.  Please continue daily skin protection including broad spectrum sunscreen SPF 30+ to sun-exposed areas, reapplying every 2 hours as needed when you're outdoors.   Staying in the shade or wearing long sleeves, sun glasses (UVA+UVB protection) and wide brim hats (4-inch brim around the entire circumference of the hat) are also recommended for sun protection.  Melanoma ABCDEs  Melanoma is the most dangerous type of skin cancer, and is the leading cause of death from skin disease.  You are more likely to develop melanoma if you: Have light-colored skin, light-colored eyes, or red or blond hair Spend a lot of time in the sun Tan regularly, either outdoors or in a tanning bed Have had blistering sunburns, especially during childhood Have a close family member who has had a melanoma  Have atypical moles or large birthmarks  Early detection of melanoma is key since treatment is typically straightforward and cure rates are extremely high if we catch it early.   The first sign of melanoma is often a change in a mole or a new dark spot.  The ABCDE system is a way of remembering the signs of melanoma.  A for asymmetry:  The two halves do not match. B for border:  The edges of the growth are irregular. C for color:  A mixture of colors are present instead of an even brown color. D for diameter:  Melanomas are usually (but not always) greater than 70m - the size of a pencil  eraser. E for evolution:  The spot keeps changing in size, shape, and color.  Please check your skin once per month between visits. You can use a [place] mirror in front and a large mirror behind you to keep an eye on the back side or your body.   If you see any new or changing lesions before your next follow-up, please call to schedule a visit.  Please continue daily skin protection including broad spectrum sunscreen SPF 30+ to sun-exposed areas, reapplying every 2 hours as needed when you're outdoors.   Staying in the shade or wearing long sleeves, sun glasses (UVA+UVB protection) and wide brim hats (4-inch brim around the entire circumference of the hat) are also recommended for sun protection.    Due to recent changes in healthcare laws, you may see results of your pathology and/or laboratory studies on MyChart before the doctors have had a chance to review them. We understand that in some cases there may be results that are confusing or concerning to you. Please understand that not all results are received at the same time and often the doctors may need to interpret multiple results in order to provide you with the best plan of care or course of treatment. Therefore, we ask that you please give u[place]2 business days to thoroughly review all your results before contacting the office for clarification. Should we see a critical lab result, you will be contacted sooner.   If You Need Anything After Your Visit  If you have any questions or concerns for your doctor, please call our main line at [phone] press option 4 to reach your doctor's medical assistant. If no one answers, please leave a voicemail as directed and we will return your call as soon as possible. Messages left after 4 pm will be answered the following business day.   You may also send u[place] message via [place] We typically respond to MyChart messages within 1-2 business days.  For prescription refills, please ask your pharmacy  to contact our office. Our fax number is [phone]  If you have an urgent issue when the clinic is closed that cannot wait until the next business day, you can page your doctor at the number below.    Please note that while we do our best to be available for urgent issues outside of office hours, we are not available 24/7.   If you have an urgent issue and are unable to reach u[place] you may choose to seek medical care at your doctor's office, retail clinic, urgent care center, or emergency room.  If you have a medical emergency, please immediately call 911 or go to the emergency department.  Pager Numbers  - Dr. KNehemiah Massed [phone] - Dr. MLaurence Ferrari [phone] - Dr. SNicole Kindred [phone] In the  event of inclement weather, please call our main line at [phone] for an update on the status of any delays or closures.  Dermatology Medication Tips: Please keep the boxes that topical medications come in in order to help keep track of the instructions about where and how to use these. Pharmacies typically print the medication instructions only on the boxes and not directly on the medication tubes.   If your medication is too expensive, please contact our office at [phone] option 4 or send [place] a message through [place].   We are unable to tell what your co-pay for medications will be in advance as this is different depending on your insurance coverage. However, we may be able to find a substitute medication at lower cost or fill out paperwork to get insurance to cover a needed medication.   If a prior authorization is required to get your medication covered by your insurance company, please allow [place] 1-2 business days to complete this process.  Drug prices often vary depending on where the prescription is filled and some pharmacies may offer cheaper prices.  The website www.goodrx.com contains coupons for medications through different pharmacies. The prices here do not account for  what the cost may be with help from insurance (it may be cheaper with your insurance), but the website can give you the price if you did not use any insurance.  - You can print the associated coupon and take it with your prescription to the pharmacy.  - You may also stop by our office during regular business hours and pick up a GoodRx coupon card.  - If you need your prescription sent electronically to a different pharmacy, notify our office through [facility] or by phone at [phone] option 4.     Si Usted Necesita Algo Despus de Su Visita  Tambin puede enviarnos un mensaje a travs de Pharmacist, community. Por lo general respondemos a los mensajes de MyChart en el transcurso de 1 a 2 das hbiles.  Para renovar recetas, por favor pida a su farmacia que se ponga en contacto con nuestra oficina. Harland Dingwall de fax es [place] [phone].  Si tiene un asunto urgente cuando la clnica est cerrada y que no puede esperar hasta el siguiente da hbil, puede llamar/localizar a su doctor(a) al nmero que aparece a continuacin.   Por favor, tenga en cuenta que aunque hacemos todo lo posible para estar disponibles para asuntos urgentes fuera del horario de [place], no estamos disponibles las 24 horas del da, los 7 das de la [place].   Si tiene un problema urgente y no puede comunicarse con nosotros, puede optar por buscar atencin mdica  en el consultorio de su doctor(a), en una clnica privada, en un centro de atencin urgente o en una sala de emergencias.  Si tiene Engineering geologist, por favor llame inmediatamente al 911 o vaya a la sala de emergencias.  Nmeros de bper  - Dr. Nehemiah Massed: [phone]  - Dra. Moye: [phone]  - Dra. Nicole Kindred: [phone]  En caso de inclemencias del [place], por favor llame a Johnsie Kindred principal al [phone] para una actualizacin sobre el [place] de cualquier retraso o cierre.  Consejos para la medicacin en dermatologa: Por favor, guarde  las cajas en las que vienen los medicamentos de uso tpico para ayudarle a seguir las instrucciones sobre dnde y cmo usarlos. Las farmacias generalmente imprimen las instrucciones del medicamento slo en las cajas y no directamente en los tubos del [place].   Si su  medicamento es [place] caro, por favor, pngase en contacto con Zigmund Daniel llamando al [phone] y presione la opcin 4 o envenos un mensaje a travs de Pharmacist, community.   No podemos decirle cul ser su copago por los medicamentos por adelantado ya que esto es diferente dependiendo de la cobertura de su seguro. Sin embargo, es posible que podamos encontrar un medicamento sustituto a Electrical engineer un formulario para que el seguro cubra el medicamento que se considera necesario.   Si se requiere una autorizacin previa para que su compaa de seguros [place] su medicamento, por favor permtanos de 1 a 2 das hbiles para completar este proceso.  Los precios de los medicamentos varan con frecuencia dependiendo del Environmental consultant de dnde se surte la receta y alguna farmacias pueden ofrecer precios ms baratos.  El sitio web www.goodrx.com tiene cupones para medicamentos de Airline pilot. Los precios aqu no tienen en cuenta lo que podra costar con la ayuda del seguro (puede ser ms barato con su seguro), pero el sitio web puede darle el precio si no utiliz Research scientist (physical sciences).  - Puede imprimir el cupn correspondiente y llevarlo con su receta a la farmacia.  - Tambin puede pasar por nuestra oficina durante el horario de atencin regular y Charity fundraiser una tarjeta de cupones de GoodRx.  - Si necesita que su receta se enve electrnicamente a una farmacia diferente, informe a nuestra oficina a travs de MyChart de [place] o por telfono llamando al [phone] y presione la opcin 4.

## 2021-11-09 ENCOUNTER — Encounter: Payer: Self-pay | Admitting: Dermatology

## 2021-11-14 ENCOUNTER — Encounter: Payer: Self-pay | Admitting: Family Medicine

## 2021-11-14 ENCOUNTER — Ambulatory Visit: Payer: BC Managed Care – PPO | Admitting: Family Medicine

## 2021-11-14 VITALS — BP 110/68 | HR 67 | Ht 64.0 in | Wt 136.4 lb

## 2021-11-14 DIAGNOSIS — J0100 Acute maxillary sinusitis, unspecified: Secondary | ICD-10-CM | POA: Diagnosis not present

## 2021-11-14 DIAGNOSIS — J069 Acute upper respiratory infection, unspecified: Secondary | ICD-10-CM

## 2021-11-14 LAB — POC COVID19 BINAXNOW: SARS Coronavirus 2 Ag: NEGATIVE

## 2021-11-14 MED ORDER — AZITHROMYCIN 250 MG PO TABS: ORAL_TABLET | ORAL | 0 refills | Status: AC

## 2021-11-14 NOTE — Assessment & Plan Note (Signed)
2 wk into uri-not getting better Maxillary sinus tenderness Neg covid test   symptm care reviewed  zpak (is amox allergic)  Nasal saline Continue asthma medicines  Reassuring exam Update if not starting to improve in a week or if worsening

## 2021-11-14 NOTE — Patient Instructions (Signed)
Take care of yourself  Nasal saline spray is helpful for congestion   Continue your asthma medicines   Take zpak as directed for sinus infection   Update if not starting to improve in a week or if worsening

## 2021-11-14 NOTE — Progress Notes (Signed)
Subjective:    Patient ID: Courtney Pineda, female    DOB: 1970/08/30, 51 y.o.   MRN: [id number]  HPI Pt presents for uri symptoms   Wt Readings from Last 3 Encounters:  11/14/21 136 lb 6.4 oz (61.9 kg)  11/03/21 134 lb (60.8 kg)  07/15/21 135 lb 4 oz (61.3 kg)   23.41 kg/m    Had virtual visit on 7/24 with congestion /rhinorrhea and neg home covid test  Recommended sympt care  Took mucinex and initially it helped and then wore off Make her feel weird also   2 weeks  Pn drip/drainage St from drainage  Runny nose - almost always clear A lot of pressure behind eyes  Both sides equal  No fever / no body aches  Is leaving for trip in a week    Results for orders placed or performed in visit on 11/14/21  POC COVID-19  Result Value Ref Range   SARS Coronavirus 2 Ag Negative Negative    Patient Active Problem List   Diagnosis Date Noted   Shoulder strain 07/15/2021   SUI (stress urinary incontinence, female) 07/07/2021   Encounter for screening mammogram for breast cancer 04/30/2021   Asthma 11/12/2020   DOE (dyspnea on exertion) 07/09/2020   Allergic rhinitis 12/25/2019   Vaccine reaction 07/26/2019   Perimenopause 06/08/2018   Low HDL (under 40) 06/08/2018   [place] intestinal bacterial overgrowth 02/09/2018   Viral URI with cough 06/10/2017   Cyst of right kidney 06/04/2017   RUQ abdominal pain 05/31/2017   Elevated glucose level 05/22/2017   S/P laparoscopic hysterectomy 01/14/2017   Abdominal pain 07/27/2016   Daily headache 06/01/2016   Chronic cough 06/01/2016   Encounter for routine gynecological examination 05/23/2014   Burning sensation of mouth 09/12/2013   Elevated transaminase level 01/13/2011   Routine general medical examination at a health care facility 01/05/2011   KIDNEY STONE 03/27/2010   Atypical chest pain 07/18/2008   STRESS REACTION, ACUTE, WITH EMOTIONAL DISTURBANCE 09/13/2007   HEADACHE 09/13/2007   Acute sinusitis 02/05/2007    Past Medical History:  Diagnosis Date   Anemia    Chronic right upper quadrant pain    Dysplastic nevus 09/16/2015   left infra axillary   Dysplastic nevus 10/27/2017   left side   History of kidney stones    Hyperemesis gravidarum 1999   [place] intestinal bacterial overgrowth 02/09/2018   Past Surgical History:  Procedure Laterality Date   ABDOMINAL HYSTERECTOMY     CHOLECYSTECTOMY  06/1994   COLONOSCOPY WITH PROPOFOL N/A 07/12/2017   Procedure: COLONOSCOPY WITH PROPOFOL;  Surgeon: Lin Landsman, MD;  Location[facility]COPY;  Service: Gastroenterology;  Laterality: N/A;   CYSTOSCOPY N/A 01/14/2017   Procedure: CYSTOSCOPY;  Surgeon: Malachy Mood, MD;  Location: [facility] ORS;  Service: Gynecology;  Laterality: N/A;   ESOPHAGOGASTRODUODENOSCOPY (EGD) WITH PROPOFOL N/A 07/12/2017   Procedure: ESOPHAGOGASTRODUODENOSCOPY (EGD) WITH PROPOFOL;  Surgeon: Lin Landsman, MD;  Location: [facility] ENDOSCOPY;  Service: Gastroenterology;  Laterality: N/A;   KIDNEY STONE SURGERY     LITHOTRIPSY  03/2006   TONSILECTOMY, ADENOIDECTOMY, BILATERAL MYRINGOTOMY AND TUBES     myringotomy tubes   TOTAL LAPAROSCOPIC HYSTERECTOMY WITH SALPINGECTOMY Bilateral 01/14/2017   Procedure: HYSTERECTOMY TOTAL LAPAROSCOPIC WITH SALPINGECTOMY;  Surgeon: Malachy Mood, MD;  Location: [facility] ORS;  Service: Gynecology;  Laterality: Bilateral;   Social History   Tobacco Use   Smoking status: Never   Smokeless tobacco: Never  Vaping Use   Vaping Use: Never  used  Substance Use Topics   Alcohol use: No    Alcohol/week: 0.0 standard drinks of alcohol   Drug use: No   Family History  Problem Relation Age of Onset   Migraines Mother    Cancer Maternal Grandmother 51       brain tumor/breast cancer   Breast cancer Maternal Grandmother    Heart disease Maternal Grandfather        heart problems   Breast cancer Paternal Aunt 83       Contact   Colon cancer Neg Hx    Esophageal cancer Neg Hx    Liver  disease Neg Hx    Pancreatic cancer Neg Hx    Stomach cancer Neg Hx    Inflammatory bowel disease Neg Hx    Allergies  Allergen Reactions   Amoxicillin Anaphylaxis   Bee Venom Anaphylaxis   Sulfa Antibiotics Anaphylaxis   Sulfonamide Derivatives Anaphylaxis    Lungs close up   Covid-19 (Mrna) Vaccine     J and J Local reaction    Penicillins Rash    Has patient had a PCN reaction causing immediate rash, facial/tongue/throat swelling, SOB or lightheadedness with hypotension: No Has patient had a PCN reaction causing severe rash involving mucus membranes or skin necrosis: No Has patient had a PCN reaction that required hospitalization: No Has patient had a PCN reaction occurring within the last 10 years: No If all of the above answers are "NO", then may proceed with Cephalosporin use.    Current Outpatient Medications on File Prior to Visit  Medication Sig Dispense Refill   acetaminophen (TYLENOL) 500 MG tablet Take 500 mg by mouth every 6 (six) hours as needed.     budesonide-formoterol (SYMBICORT) 160-4.5 MCG/ACT inhaler Inhale 2 puffs into the lungs 2 (two) times daily. 1 each 6   Cholecalciferol (D3-1000 PO) Take by mouth.     meloxicam (MOBIC) 15 MG tablet Take 1 tablet (15 mg total) by mouth daily as needed for pain. With food 30 tablet 1   montelukast (SINGULAIR) 10 MG tablet Take 1 tablet (10 mg total) by mouth daily. 90 tablet 3   Tiotropium Bromide Monohydrate (SPIRIVA RESPIMAT) 2.5 MCG/ACT AERS Inhale 2 puffs into the lungs daily. 4 g 11   No current facility-administered medications on file prior to visit.    Review of Systems  Constitutional:  Negative for activity change, appetite change, fatigue, fever and unexpected weight change.  HENT:  Positive for congestion, rhinorrhea, sinus pressure and sinus pain. Negative for ear pain and sore throat.   Eyes:  Negative for pain, redness and visual disturbance.  Respiratory:  Positive for cough. Negative for shortness of  breath and wheezing.   Cardiovascular:  Negative for chest pain and palpitations.  Gastrointestinal:  Negative for abdominal pain, blood in stool, constipation and diarrhea.  Endocrine: Negative for polydipsia and polyuria.  Genitourinary:  Negative for dysuria, frequency and urgency.  Musculoskeletal:  Negative for arthralgias, back pain and myalgias.  Skin:  Negative for pallor and rash.  Allergic/Immunologic: Negative for environmental allergies.  Neurological:  Negative for dizziness, syncope and headaches.  Hematological:  Negative for adenopathy. Does not bruise/bleed easily.  Psychiatric/Behavioral:  Negative for decreased concentration and dysphoric mood. The patient is not nervous/anxious.        Objective:   Physical Exam Constitutional:      General: She is not in acute distress.    Appearance: Normal appearance. She is well-developed and normal weight. She is not  ill-appearing.  HENT:     Head: Normocephalic and atraumatic.     Comments: Nares are injected and congested  Clear pnd   Bilateral maxillary sinus tenderness     Right Ear: Tympanic membrane, ear canal and external ear normal.     Left Ear: Tympanic membrane, ear canal and external ear normal.     Nose: Congestion and rhinorrhea present.     Mouth/Throat:     Mouth: Mucous membranes are moist.     Pharynx: No oropharyngeal exudate or posterior oropharyngeal erythema.     Comments: Clear pnd Eyes:     General:        Right eye: No discharge.        Left eye: No discharge.     Conjunctiva/sclera: Conjunctivae normal.     Pupils: Pupils are equal, round, and reactive to light.  Cardiovascular:     Rate and Rhythm: Normal rate and regular rhythm.  Pulmonary:     Effort: Pulmonary effort is normal. No respiratory distress.     Breath sounds: Normal breath sounds. No stridor. No wheezing, rhonchi or rales.  Musculoskeletal:     Cervical back: Normal range of motion and neck supple.  Lymphadenopathy:      Cervical: No cervical adenopathy.  Skin:    General: Skin is warm and dry.     Findings: No rash.  Neurological:     Mental Status: She is alert.     Cranial Nerves: No cranial nerve deficit.  Psychiatric:        Mood and Affect: Mood normal.           Assessment & Plan:   Problem List Items Addressed This Visit       Respiratory   Acute sinusitis    2 wk into uri-not getting better Maxillary sinus tenderness Neg covid test   symptm care reviewed  zpak (is amox allergic)  Nasal saline Continue asthma medicines  Reassuring exam Update if not starting to improve in a week or if worsening        Relevant Medications   azithromycin (ZITHROMAX) 250 MG tablet   Viral URI with cough - Primary   Relevant Medications   azithromycin (ZITHROMAX) 250 MG tablet   Other Relevant Orders   POC COVID-19 (Completed)

## 2021-11-20 ENCOUNTER — Ambulatory Visit: Payer: BC Managed Care – PPO | Admitting: Pulmonary Disease

## 2021-11-20 ENCOUNTER — Other Ambulatory Visit: Payer: Self-pay | Admitting: Obstetrics & Gynecology

## 2021-11-20 ENCOUNTER — Encounter: Payer: Self-pay | Admitting: Obstetrics & Gynecology

## 2021-11-20 ENCOUNTER — Encounter: Payer: Self-pay | Admitting: Pulmonary Disease

## 2021-11-20 ENCOUNTER — Ambulatory Visit: Payer: BC Managed Care – PPO | Admitting: Obstetrics & Gynecology

## 2021-11-20 VITALS — BP 134/78 | HR 65 | Temp 98.0°F | Ht 64.0 in | Wt 135.2 lb

## 2021-11-20 VITALS — BP 120/80 | Ht 64.0 in | Wt 135.0 lb

## 2021-11-20 DIAGNOSIS — N6322 Unspecified lump in the left breast, upper inner quadrant: Secondary | ICD-10-CM | POA: Diagnosis not present

## 2021-11-20 DIAGNOSIS — J4530 Mild persistent asthma, uncomplicated: Secondary | ICD-10-CM | POA: Diagnosis not present

## 2021-11-20 DIAGNOSIS — N6320 Unspecified lump in the left breast, unspecified quadrant: Secondary | ICD-10-CM | POA: Insufficient documentation

## 2021-11-20 DIAGNOSIS — J9801 Acute bronchospasm: Secondary | ICD-10-CM

## 2021-11-20 MED ORDER — ALBUTEROL SULFATE HFA 108 (90 BASE) MCG/ACT IN AERS: 2.0000 | INHALATION_SPRAY | Freq: Four times a day (QID) | RESPIRATORY_TRACT | 2 refills | Status: AC | PRN

## 2021-11-20 NOTE — Patient Instructions (Signed)
You may stop the Spiriva.  We are sending in a prescription for an inhaler called albuterol (ProAir, Ventolin, Proventil) which is a rescue inhaler and you may use 2 puffs up to 4 times a day as needed for shortness of breath, wheezing or cough.  Continue taking your montelukast.  If you notice that you have to use your albuterol very frequently start taking your Symbicort again 2 puffs twice a day regularly and let us know.  We will see you in follow-up in 6 months time call sooner should any new problems arise.

## 2021-11-20 NOTE — Progress Notes (Signed)
Subjective:    Patient ID: Courtney Pineda, female    DOB: 1970-05-22, 51 y.o.   MRN: [id number] Patient Care Team: Abner Greenspan, MD as PCP - General  Chief Complaint  Patient presents with   Follow-up    Recently completed course of zpak. C/o occ prod cough with clear sputum.    HPI Courtney Pineda is a 51 year old lifelong never smoker who presents for follow-up on the issue of chronic cough.  She does have moderate persistent asthma with cough variant.  Last evaluated here on 29 May 2021 at that time she was advised to continue Symbicort 160/4.5, 2 inhalations twice a day and Spiriva 1.25 mcg, 2 puffs daily, reintroduced to her to her regimen because of persistent symptoms.  He also was instructed to continue montelukast.  She had been doing well with the regimen.  He did have acute sinusitis noted on 14 November 2021 and treated with Azithromycin per Dr. Glori Bickers.  With regards to her cough she has been doing well but she would like to try getting off of Spiriva and Symbicort.  She has not had any fevers, chills or sweats.  Cough has resolved.  No hemoptysis.  She does not endorse any other symptomatology.  Overall she feels well and looks well.   Review of Systems A 10 point review of systems was performed and it is as noted above otherwise negative.  Patient Active Problem List   Diagnosis Date Noted   Shoulder strain 07/15/2021   SUI (stress urinary incontinence, female) 07/07/2021   Encounter for screening mammogram for breast cancer 04/30/2021   Asthma 11/12/2020   DOE (dyspnea on exertion) 07/09/2020   Allergic rhinitis 12/25/2019   Vaccine reaction 07/26/2019   Perimenopause 06/08/2018   Low HDL (under 40) 06/08/2018   [place] intestinal bacterial overgrowth 02/09/2018   Viral URI with cough 06/10/2017   Cyst of right kidney 06/04/2017   RUQ abdominal pain 05/31/2017   Elevated glucose level 05/22/2017   S/P laparoscopic hysterectomy 01/14/2017   Abdominal pain  07/27/2016   Daily headache 06/01/2016   Chronic cough 06/01/2016   Encounter for routine gynecological examination 05/23/2014   Burning sensation of mouth 09/12/2013   Elevated transaminase level 01/13/2011   Routine general medical examination at a health care facility 01/05/2011   KIDNEY STONE 03/27/2010   Atypical chest pain 07/18/2008   STRESS REACTION, ACUTE, WITH EMOTIONAL DISTURBANCE 09/13/2007   HEADACHE 09/13/2007   Acute sinusitis 02/05/2007   Social History   Tobacco Use   Smoking status: Never   Smokeless tobacco: Never  Substance Use Topics   Alcohol use: No    Alcohol/week: 0.0 standard drinks of alcohol   Allergies  Allergen Reactions   Amoxicillin Anaphylaxis   Bee Venom Anaphylaxis   Sulfa Antibiotics Anaphylaxis   Sulfonamide Derivatives Anaphylaxis    Lungs close up   Covid-19 (Mrna) Vaccine     J and J Local reaction    Penicillins Rash    Has patient had a PCN reaction causing immediate rash, facial/tongue/throat swelling, SOB or lightheadedness with hypotension: No Has patient had a PCN reaction causing severe rash involving mucus membranes or skin necrosis: No Has patient had a PCN reaction that required hospitalization: No Has patient had a PCN reaction occurring within the last 10 years: No If all of the above answers are "NO", then may proceed with Cephalosporin use.    Current Meds  Medication Sig   acetaminophen (TYLENOL) 500 MG tablet Take 500 mg  by mouth every 6 (six) hours as needed.   budesonide-formoterol (SYMBICORT) 160-4.5 MCG/ACT inhaler Inhale 2 puffs into the lungs 2 (two) times daily.   Cholecalciferol (D3-1000 PO) Take by mouth.   meloxicam (MOBIC) 15 MG tablet Take 1 tablet (15 mg total) by mouth daily as needed for pain. With food   montelukast (SINGULAIR) 10 MG tablet Take 1 tablet (10 mg total) by mouth daily.   Tiotropium Bromide Monohydrate (SPIRIVA RESPIMAT) 2.5 MCG/ACT AERS Inhale 2 puffs into the lungs daily.    Immunization History  Administered Date(s) Administered   Influenza Split 01/13/2011, 02/02/2012   Influenza Whole 03/22/2002, 12/27/2009   Influenza,inj,Quad PF,6+ Mos 01/20/2013, 01/26/2014, 01/25/2015, 02/12/2016, 01/27/2017, 01/06/2018, 01/05/2019, 01/17/2020, 01/17/2021   Janssen (J&J) SARS-COV-2 Vaccination 07/19/2019   Td 06/15/2002   Tdap 03/14/2013       Objective:   Physical Exam BP 134/78 (BP Location: Left Arm, Cuff Size: Normal)   Pulse 65   Temp 98 F (36.7 C) (Temporal)   Ht 5' 4"$  (1.626 m)   Wt 135 lb 3.2 oz (61.3 kg)   LMP 01/08/2017 (Exact Date)   SpO2 97%   BMI 23.21 kg/m  GENERAL: Well-developed, well-nourished woman, no acute distress fully ambulatory.  No conversational dyspnea.  Intermittent dry cough. HEAD: Normocephalic, atraumatic.  EYES: Pupils equal, round, reactive to light.  No scleral icterus.  Slightly injected conjunctiva. MOUTH: Nose/mouth/throat not examined due to masking requirements for COVID 19. NECK: Supple. No thyromegaly. Trachea midline. No JVD.  No adenopathy. PULMONARY: Slightly diminished bilaterally.  No adventitious sounds. CARDIOVASCULAR: S1 and S2. Regular rate and rhythm.  No rubs, murmurs or gallops heard. ABDOMEN: Benign. MUSCULOSKELETAL: No joint deformity, no clubbing, no edema.  NEUROLOGIC: No focal deficit, no gait disturbance, speech is fluent. SKIN: Intact,warm,dry. PSYCH: Mood and behavior normal.       Assessment & Plan:     ICD-10-CM   1. Mild persistent asthma without complication  A999333    Albuterol for rescue sent to the patient's pharmacy Okay to discontinue Spiriva Trial off of Symbicort but resume if symptoms recur Continue montelukast    2. Cough due to bronchospasm - resolved  J98.01    This has resolved completely Continue to follow expectantly      Meds ordered this encounter  Medications   albuterol (VENTOLIN HFA) 108 (90 Base) MCG/ACT inhaler    Sig: Inhale 2 puffs into the lungs  every 6 (six) hours as needed for wheezing or shortness of breath.    Dispense:  8 g    Refill:  2   We discussed strategies for treatment of her asthma.  She would like to try being off inhalers.  I advised her against this however she wants to give this a try.  She has been advised to resume taking Symbicort if her symptoms resume.  She will have albuterol as needed.  She is to continue montelukast as well.  Will see her in follow-up in 6 months time she is to contact us prior to that time should any new difficulties arise.  Renold Don, MD Advanced [facility]ch[facility]    *This note was dictated using voice recognition software/Dragon.  Despite best efforts to proofread, errors can occur which can change the meaning. Any transcriptional errors that result from this process are unintentional and may not be fully corrected at the time of dictation.

## 2021-11-20 NOTE — Progress Notes (Signed)
Pt for mammo

## 2021-11-20 NOTE — Patient Instructions (Signed)
Get mammo as sched

## 2021-12-09 ENCOUNTER — Ambulatory Visit
Admission: RE | Admit: 2021-12-09 | Discharge: 2021-12-09 | Disposition: A | Discharge status: Home / Self Care | Payer: BC Managed Care – PPO | Source: Ambulatory Visit | Attending: Obstetrics & Gynecology | Admitting: Obstetrics & Gynecology

## 2021-12-09 DIAGNOSIS — N6322 Unspecified lump in the left breast, upper inner quadrant: Secondary | ICD-10-CM

## 2021-12-09 DIAGNOSIS — R922 Inconclusive mammogram: Secondary | ICD-10-CM | POA: Diagnosis not present

## 2021-12-09 DIAGNOSIS — N6489 Other specified disorders of breast: Secondary | ICD-10-CM | POA: Diagnosis not present

## 2022-01-14 ENCOUNTER — Telehealth: Payer: Self-pay | Admitting: Family Medicine

## 2022-01-14 NOTE — Telephone Encounter (Signed)
Patient called in and stated she would like a call back. She stated that the pain in her right side has came back and Dr. Glori Bickers is aware of this. Thank you!

## 2022-01-14 NOTE — Telephone Encounter (Signed)
Appt scheduled. Pt said she doesn't remember the name of the pain doc she saw him once and he recommended a "shot" she wasn't sure what shot and she didn't feel comfortable getting the shot so she never went back. Pt said it's been a while since she saw him.  Appt scheduled for 01/16/22 with PCP FYI to PCP

## 2022-01-14 NOTE — Telephone Encounter (Signed)
Please have her follow up.  I know she has had a big work up and has been to pain management-please ask when her last visit there was and send for the note if possible

## 2022-01-15 ENCOUNTER — Ambulatory Visit: Payer: BC Managed Care – PPO

## 2022-01-16 ENCOUNTER — Ambulatory Visit: Payer: BC Managed Care – PPO

## 2022-01-16 ENCOUNTER — Encounter: Payer: Self-pay | Admitting: Family Medicine

## 2022-01-16 ENCOUNTER — Ambulatory Visit: Payer: BC Managed Care – PPO | Admitting: Family Medicine

## 2022-01-16 VITALS — BP 110/62 | HR 61 | Temp 98.1°F | Ht 64.0 in | Wt 135.4 lb

## 2022-01-16 DIAGNOSIS — R1011 Right upper quadrant pain: Secondary | ICD-10-CM

## 2022-01-16 LAB — CBC WITH DIFFERENTIAL/PLATELET
Basophils Absolute: 0 10*3/uL (ref 0.0–0.1)
Basophils Relative: 0.8 % (ref 0.0–3.0)
Eosinophils Absolute: 0.1 10*3/uL (ref 0.0–0.7)
Eosinophils Relative: 1.5 % (ref 0.0–5.0)
HCT: 42.6 % (ref 36.0–46.0)
Hemoglobin: 14.3 g/dL (ref 12.0–15.0)
Lymphocytes Relative: 35.4 % (ref 12.0–46.0)
Lymphs Abs: 1.9 10*3/uL (ref 0.7–4.0)
MCHC: 33.6 g/dL (ref 30.0–36.0)
MCV: 91.5 fl (ref 78.0–100.0)
Monocytes Absolute: 0.4 10*3/uL (ref 0.1–1.0)
Monocytes Relative: 7.7 % (ref 3.0–12.0)
Neutro Abs: 2.9 10*3/uL (ref 1.4–7.7)
Neutrophils Relative %: 54.6 % (ref 43.0–77.0)
Platelets: 215 10*3/uL (ref 150.0–400.0)
RBC: 4.65 Mil/uL (ref 3.87–5.11)
RDW: 13.2 % (ref 11.5–15.5)
WBC: 5.4 10*3/uL (ref 4.0–10.5)

## 2022-01-16 LAB — BASIC METABOLIC PANEL
BUN: 16 mg/dL (ref 6–23)
CO2: 32 mEq/L (ref 19–32)
Calcium: 9.8 mg/dL (ref 8.4–10.5)
Chloride: 105 mEq/L (ref 96–112)
Creatinine, Ser: 0.96 mg/dL (ref 0.40–1.20)
GFR: 69.82 mL/min (ref >60.00)
Glucose, Bld: 94 mg/dL (ref 70–99)
Potassium: 4.4 mEq/L (ref 3.5–5.1)
Sodium: 141 mEq/L (ref 135–145)

## 2022-01-16 LAB — HEPATIC FUNCTION PANEL
ALT: 22 U/L (ref 0–35)
AST: 27 U/L (ref 0–37)
Albumin: 4.4 g/dL (ref 3.5–5.2)
Alkaline Phosphatase: 80 U/L (ref 39–117)
Bilirubin, Direct: 0.2 mg/dL (ref 0.0–0.3)
Total Bilirubin: 1.1 mg/dL (ref 0.2–1.2)
Total Protein: 6.9 g/dL (ref 6.0–8.3)

## 2022-01-16 NOTE — Patient Instructions (Addendum)
For constipation  Drink at least 64 oz of fluids daily   Prunes and prune juice are helpful  Lots of veggies Try miralax - takes 2-3 days to work   Take care of yourself   See if the pain improves with exercise and improved bowel movements   Consider going back to the pain clinic if symptoms worsen or do not improve  Labs today

## 2022-01-16 NOTE — Progress Notes (Signed)
Subjective:    Patient ID: Courtney Pineda, female    DOB: 26-Dec-1970, 52 y.o.   MRN: [id number]  HPI Pt presents for f/u of R abdominal pain   Wt Readings from Last 3 Encounters:  01/16/22 135 lb 6.4 oz (61.4 kg)  11/20/21 135 lb (61.2 kg)  11/20/21 135 lb 3.2 oz (61.3 kg)   23.24 kg/m Right sided abd pain started back Was working at her desk Nothing new   Goes to the gym- burn class, weights , does not remember straining  In good shape Trying to get some more weight off  (had a cruise in august and then gained a bit after)  Frustrated by 1-2 lb   Noted more belly fat with insurance test-this concerns her  More hot flashes from menopause   Right upper quadrant of abd  4/10 on pain scale Feels like there is a knot there  The other day it was sharp  Some bloating Some more constipation and straining     Made worse by some positional change  Worse when lying on R side   Made better by leaning to the Right   Not radiating Not burning  No rash   No n/v Is tired  A little heartburn -not taking any acid lowering med (did not help in the past)      Has had GI and urologic w/u incl imaging and endoscopy  Went to the pain clinic as well -had discussed a guided tap block  She was hesitant about that and did not do it    Past h/o ccy and hysterectomy   Last CT 2019  IMPRESSION: 1. No acute abnormality. No evidence of bowel obstruction or acute bowel inflammation. Normal appendix. 2. Nonobstructing left nephrolithiasis.  No hydronephrosis. 3. Indeterminate hypodense 1.4 cm inferior right renal cortical mass, minimally increased in size. MRI abdomen without and with IV contrast is recommended for further characterization  Renal cyst was imaged by MRI as well IMPRESSION: [place] benign Bosniak category 2 cyst in lower pole of right kidney, which corresponds with lesion seen on recent CT. No evidence of renal neoplasm or other significant  abnormality.  Was dx with sm bowel intestinal overgrowth ahd had a trial of xifaxan from GI -did not respond      Lab Results  Component Value Date   WBC 5.8 06/10/2021   HGB 13.9 06/10/2021   HCT 41.3 06/10/2021   MCV 91.9 06/10/2021   PLT 240.0 06/10/2021   Lab Results  Component Value Date   HGBA1C 5.1 06/10/2021   Lab Results  Component Value Date   CREATININE 0.91 06/10/2021   BUN 13 06/10/2021   NA 140 06/10/2021   K 4.0 06/10/2021   CL 104 06/10/2021   CO2 28 06/10/2021   Lab Results  Component Value Date   ALT 24 06/10/2021   AST 25 06/10/2021   ALKPHOS 83 06/10/2021   BILITOT 1.3 (H) 06/10/2021   Diet Good veggies Not a lot of fruits  Avoiding sugars   Patient Active Problem List   Diagnosis Date Noted   Left breast mass 11/20/2021   Shoulder strain 07/15/2021   SUI (stress urinary incontinence, female) 07/07/2021   Encounter for screening mammogram for breast cancer 04/30/2021   Asthma 11/12/2020   DOE (dyspnea on exertion) 07/09/2020   Allergic rhinitis 12/25/2019   Vaccine reaction 07/26/2019   Perimenopause 06/08/2018   Low HDL (under 40) 06/08/2018   [place] intestinal bacterial overgrowth 02/09/2018  Cyst of right kidney 06/04/2017   RUQ abdominal pain 05/31/2017   Elevated glucose level 05/22/2017   S/P laparoscopic hysterectomy 01/14/2017   Abdominal pain 07/27/2016   Daily headache 06/01/2016   Chronic cough 06/01/2016   Encounter for routine gynecological examination 05/23/2014   Burning sensation of mouth 09/12/2013   Elevated transaminase level 01/13/2011   Routine general medical examination at a health care facility 01/05/2011   KIDNEY STONE 03/27/2010   Atypical chest pain 07/18/2008   STRESS REACTION, ACUTE, WITH EMOTIONAL DISTURBANCE 09/13/2007   HEADACHE 09/13/2007   Acute sinusitis 02/05/2007   Past Medical History:  Diagnosis Date   Anemia    Chronic right upper quadrant pain    Dysplastic nevus 09/16/2015   left  infra axillary   Dysplastic nevus 10/27/2017   left side   History of kidney stones    Hyperemesis gravidarum 1999   [place] intestinal bacterial overgrowth 02/09/2018   Past Surgical History:  Procedure Laterality Date   ABDOMINAL HYSTERECTOMY     CHOLECYSTECTOMY  06/1994   COLONOSCOPY WITH PROPOFOL N/A 07/12/2017   Procedure: COLONOSCOPY WITH PROPOFOL;  Surgeon: Lin Landsman, MD;  Location: [place];  Service: Gastroenterology;  Laterality: N/A;   CYSTOSCOPY N/A 01/14/2017   Procedure: CYSTOSCOPY;  Surgeon: Malachy Mood, MD;  Location: [facility];  Service: Gynecology;  Laterality: N/A;   ESOPHAGOGASTRODUODENOSCOPY (EGD) WITH PROPOFOL N/A 07/12/2017   Procedure: ESOPHAGOGASTRODUODENOSCOPY (EGD) WITH PROPOFOL;  Surgeon: Lin Landsman, MD;  Location: [facility];  Service: Gastroenterology;  Laterality: N/A;   KIDNEY STONE SURGERY     LITHOTRIPSY  03/2006   TONSILECTOMY, ADENOIDECTOMY, BILATERAL MYRINGOTOMY AND TUBES     myringotomy tubes   TOTAL LAPAROSCOPIC HYSTERECTOMY WITH SALPINGECTOMY Bilateral 01/14/2017   Procedure: HYSTERECTOMY TOTAL LAPAROSCOPIC WITH SALPINGECTOMY;  Surgeon: Malachy Mood, MD;  Location: [facility];  Service: Gynecology;  Laterality: Bilateral;   Social History   Tobacco Use   Smoking status: Never   Smokeless tobacco: Never  Vaping Use   Vaping Use: Never used  Substance Use Topics   Alcohol use: No    Alcohol/week: 0.0 standard drinks of alcohol   Drug use: No   Family History  Problem Relation Age of Onset   Migraines Mother    Cancer Maternal Grandmother 64       brain tumor/breast cancer   Breast cancer Maternal Grandmother    Heart disease Maternal Grandfather        heart problems   Breast cancer Paternal Aunt 83       Contact   Colon cancer Neg Hx    Esophageal cancer Neg Hx    Liver disease Neg Hx    Pancreatic cancer Neg Hx    Stomach cancer Neg Hx    Inflammatory bowel disease Neg Hx    Allergies  Allergen  Reactions   Amoxicillin Anaphylaxis   Bee Venom Anaphylaxis   Sulfa Antibiotics Anaphylaxis   Sulfonamide Derivatives Anaphylaxis    Lungs close up   Covid-19 (Mrna) Vaccine     J and J Local reaction    Penicillins Rash    Has patient had a PCN reaction causing immediate rash, facial/tongue/throat swelling, SOB or lightheadedness with hypotension: No Has patient had a PCN reaction causing severe rash involving mucus membranes or skin necrosis: No Has patient had a PCN reaction that required hospitalization: No Has patient had a PCN reaction occurring within the last 10 years: No If all of the above answers are "NO", then may  proceed with Cephalosporin use.    Current Outpatient Medications on File Prior to Visit  Medication Sig Dispense Refill   acetaminophen (TYLENOL) 500 MG tablet Take 500 mg by mouth every 6 (six) hours as needed.     albuterol (VENTOLIN HFA) 108 (90 Base) MCG/ACT inhaler Inhale 2 puffs into the lungs every 6 (six) hours as needed for wheezing or shortness of breath. 8 g 2   budesonide-formoterol (SYMBICORT) 160-4.5 MCG/ACT inhaler Inhale 2 puffs into the lungs 2 (two) times daily. 1 each 6   Cholecalciferol (D3-1000 PO) Take by mouth.     meloxicam (MOBIC) 15 MG tablet Take 1 tablet (15 mg total) by mouth daily as needed for pain. With food 30 tablet 1   montelukast (SINGULAIR) 10 MG tablet Take 1 tablet (10 mg total) by mouth daily. 90 tablet 3   Tiotropium Bromide Monohydrate (SPIRIVA RESPIMAT) 2.5 MCG/ACT AERS Inhale 2 puffs into the lungs daily. 4 g 11   No current facility-administered medications on file prior to visit.       Review of Systems  Constitutional:  Negative for activity change, appetite change, fatigue, fever and unexpected weight change.  HENT:  Negative for congestion, ear pain, rhinorrhea, sinus pressure and sore throat.   Eyes:  Negative for pain, redness and visual disturbance.  Respiratory:  Negative for cough, shortness of breath and  wheezing.   Cardiovascular:  Negative for chest pain and palpitations.  Gastrointestinal:  Positive for abdominal distention and constipation. Negative for anal bleeding, blood in stool, diarrhea, nausea and rectal pain.  Endocrine: Negative for polydipsia and polyuria.  Genitourinary:  Negative for dysuria, frequency and urgency.  Musculoskeletal:  Negative for arthralgias, back pain and myalgias.  Skin:  Negative for pallor and rash.  Allergic/Immunologic: Negative for environmental allergies.  Neurological:  Negative for dizziness, syncope and headaches.  Hematological:  Negative for adenopathy. Does not bruise/bleed easily.  Psychiatric/Behavioral:  Negative for decreased concentration and dysphoric mood. The patient is not nervous/anxious.        Objective:   Physical Exam Constitutional:      General: She is not in acute distress.    Appearance: Normal appearance. She is well-developed and normal weight. She is not ill-appearing or diaphoretic.  HENT:     Head: Normocephalic and atraumatic.     Mouth/Throat:     Mouth: Mucous membranes are moist.  Eyes:     General: No scleral icterus.    Conjunctiva/sclera: Conjunctivae normal.     Pupils: Pupils are equal, round, and reactive to light.  Neck:     Thyroid: No thyromegaly.     Vascular: No carotid bruit or JVD.  Cardiovascular:     Rate and Rhythm: Normal rate and regular rhythm.     Heart sounds: Normal heart sounds.     No gallop.  Pulmonary:     Effort: Pulmonary effort is normal. No respiratory distress.     Breath sounds: Normal breath sounds. No stridor. No wheezing, rhonchi or rales.  Chest:     Chest wall: No tenderness.  Abdominal:     General: Abdomen is flat. Bowel sounds are normal. There is no distension or abdominal bruit.     Palpations: Abdomen is soft. There is no fluid wave, hepatomegaly, splenomegaly, mass or pulsatile mass.     Tenderness: There is abdominal tenderness in the right upper quadrant.  There is no right CVA tenderness, left CVA tenderness, guarding or rebound. Negative signs include Murphy's sign and McBurney's  sign.  Musculoskeletal:     Cervical back: Normal range of motion and neck supple.     Right lower leg: No edema.     Left lower leg: No edema.  Lymphadenopathy:     Cervical: No cervical adenopathy.  Skin:    General: Skin is warm and dry.     Coloration: Skin is not pale.     Findings: No rash.  Neurological:     Mental Status: She is alert.     Coordination: Coordination normal.     Deep Tendon Reflexes: Reflexes are normal and symmetric. Reflexes normal.  Psychiatric:        Mood and Affect: Mood normal.           Assessment & Plan:   Problem List Items Addressed This Visit       Other   RUQ abdominal pain - Primary    After being pain free for a while , this has returned  No known trigger  Rev last office notes, GI /urology and pain clinic Rev endoscopies, CT and MRI  Pt was apprehensive to pursue nerve block but may re consider  Some constipation-discussed use of miralax, prunes, fiber and fluid Symptoms have improved a little  Fitness efforts may help  Disc topical tx (voltaren or salon pas or heat) and stretching  Lab today incl LFTs (normal last time)  Update if not starting to improve in a week or if worsening         Relevant Orders   Basic metabolic panel   Hepatic function panel   CBC with Differential/Platelet

## 2022-01-16 NOTE — Assessment & Plan Note (Signed)
After being pain free for a while , this has returned  No known trigger  Rev last office notes, GI /urology and pain clinic Rev endoscopies, CT and MRI  Pt was apprehensive to pursue nerve block but may re consider  Some constipation-discussed use of miralax, prunes, fiber and fluid Symptoms have improved a little  Fitness efforts may help  Disc topical tx (voltaren or salon pas or heat) and stretching  Lab today incl LFTs (normal last time)  Update if not starting to improve in a week or if worsening

## 2022-01-22 ENCOUNTER — Ambulatory Visit: Payer: BC Managed Care – PPO

## 2022-01-30 ENCOUNTER — Ambulatory Visit: Payer: BC Managed Care – PPO

## 2022-01-30 ENCOUNTER — Ambulatory Visit (INDEPENDENT_AMBULATORY_CARE_PROVIDER_SITE_OTHER): Payer: BC Managed Care – PPO

## 2022-01-30 DIAGNOSIS — Z23 Encounter for immunization: Secondary | ICD-10-CM | POA: Diagnosis not present

## 2022-03-25 ENCOUNTER — Ambulatory Visit: Payer: BC Managed Care – PPO | Admitting: Dermatology

## 2022-04-01 NOTE — Progress Notes (Unsigned)
Tower, Courtney Fanny, MD   No chief complaint on file.   HPI:      [name] is a 51 y.o. [id number] whose LMP was Patient's last menstrual period was 01/08/2017 (exact date)., presents today for ***  menses are absent due to total lap hyst with salpingectomy 2018 with Dr. Georgianne Fick for AUB/leio. No PMB. Doing well. She does have vasomotor sx.    Sex activity: single partner, contraception - status post hysterectomy. She does have vaginal dryness and pain, hasn't used lubricants.  Patient Active Problem List   Diagnosis Date Noted   Left breast mass 11/20/2021   Shoulder strain 07/15/2021   SUI (stress urinary incontinence, female) 07/07/2021   Encounter for screening mammogram for breast cancer 04/30/2021   Asthma 11/12/2020   DOE (dyspnea on exertion) 07/09/2020   Allergic rhinitis 12/25/2019   Vaccine reaction 07/26/2019   Perimenopause 06/08/2018   Low HDL (under 40) 06/08/2018   [place] intestinal bacterial overgrowth 02/09/2018   Cyst of right kidney 06/04/2017   RUQ abdominal pain 05/31/2017   Elevated glucose level 05/22/2017   S/P laparoscopic hysterectomy 01/14/2017   Abdominal pain 07/27/2016   Daily headache 06/01/2016   Chronic cough 06/01/2016   Encounter for routine gynecological examination 05/23/2014   Burning sensation of mouth 09/12/2013   Elevated transaminase level 01/13/2011   Routine general medical examination at a health care facility 01/05/2011   KIDNEY STONE 03/27/2010   Atypical chest pain 07/18/2008   STRESS REACTION, ACUTE, WITH EMOTIONAL DISTURBANCE 09/13/2007   HEADACHE 09/13/2007   Acute sinusitis 02/05/2007    Past Surgical History:  Procedure Laterality Date   ABDOMINAL HYSTERECTOMY     CHOLECYSTECTOMY  06/1994   COLONOSCOPY WITH PROPOFOL N/A 07/12/2017   Procedure: COLONOSCOPY WITH PROPOFOL;  Surgeon: Lin Landsman, MD;  Location: [facility] ENDOSCOPY;  Service: Gastroenterology;  Laterality: N/A;   CYSTOSCOPY N/A 01/14/2017    Procedure: CYSTOSCOPY;  Surgeon: Malachy Mood, MD;  Location: [facility] ORS;  Service: Gynecology;  Laterality: N/A;   ESOPHAGOGASTRODUODENOSCOPY (EGD) WITH PROPOFOL N/A 07/12/2017   Procedure: ESOPHAGOGASTRODUODENOSCOPY (EGD) WITH PROPOFOL;  Surgeon: Lin Landsman, MD;  Location: [facility] ENDOSCOPY;  Service: Gastroenterology;  Laterality: N/A;   KIDNEY STONE SURGERY     LITHOTRIPSY  03/2006   TONSILECTOMY, ADENOIDECTOMY, BILATERAL MYRINGOTOMY AND TUBES     myringotomy tubes   TOTAL LAPAROSCOPIC HYSTERECTOMY WITH SALPINGECTOMY Bilateral 01/14/2017   Procedure: HYSTERECTOMY TOTAL LAPAROSCOPIC WITH SALPINGECTOMY;  Surgeon: Malachy Mood, MD;  Location: [facility] ORS;  Service: Gynecology;  Laterality: Bilateral;    Family History  Problem Relation Age of Onset   Migraines Mother    Cancer Maternal Grandmother 32       brain tumor/breast cancer   Breast cancer Maternal Grandmother    Heart disease Maternal Grandfather        heart problems   Breast cancer Paternal Aunt 39       Contact   Colon cancer Neg Hx    Esophageal cancer Neg Hx    Liver disease Neg Hx    Pancreatic cancer Neg Hx    Stomach cancer Neg Hx    Inflammatory bowel disease Neg Hx     Social History   Socioeconomic History   Marital status: Married    Spouse name: Not on file   Number of children: 2   Years of education: Not on file   Highest education level: Not on file  Occupational History   Occupation: insurance  Employer: [employer]  Tobacco Use   Smoking status: Never   Smokeless tobacco: Never  Vaping Use   Vaping Use: Never used  Substance and Sexual Activity   Alcohol use: No    Alcohol/week: 0.0 standard drinks of alcohol   Drug use: No   Sexual activity: Yes    Birth control/protection: Surgical    Comment: Hysterectomy  Other Topics Concern   Not on file  Social History Narrative   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, she is an Theatre manager   Rare  caffeine and never smoker no alcohol no drug or substance use   Social Determinants of Radio broadcast assistant Strain: Not on file  Food Insecurity: Not on file  Transportation Needs: Not on file  Physical Activity: Insufficiently Active (03/02/2017)   Exercise Vital Sign    Days of Exercise per Week: 3 days    Minutes of Exercise per Session: 30 min  Stress: No Stress Concern Present (03/02/2017)   [place]    Feeling of Stress : Only a little  Social Connections: Moderately Integrated (03/02/2017)   Social Connection and Isolation Panel [NHANES]    Frequency of Communication with Friends and Family: More than three times a week    Frequency of Social Gatherings with Friends and Family: More than three times a week    Attends Religious Services: More than 4 times per year    Active Member of [employer] or Organizations: No    Attends Archivist Meetings: Never    Marital Status: Married  Human resources officer Violence: Not At Risk (03/02/2017)   Humiliation, Afraid, Rape, and Kick questionnaire    Fear of Current or Ex-Partner: No    Emotionally Abused: No    Physically Abused: No    Sexually Abused: No    Outpatient Medications Prior to Visit  Medication Sig Dispense Refill   acetaminophen (TYLENOL) 500 MG tablet Take 500 mg by mouth every 6 (six) hours as needed.     albuterol (VENTOLIN HFA) 108 (90 Base) MCG/ACT inhaler Inhale 2 puffs into the lungs every 6 (six) hours as needed for wheezing or shortness of breath. 8 g 2   budesonide-formoterol (SYMBICORT) 160-4.5 MCG/ACT inhaler Inhale 2 puffs into the lungs 2 (two) times daily. 1 each 6   Cholecalciferol (D3-1000 PO) Take by mouth.     meloxicam (MOBIC) 15 MG tablet Take 1 tablet (15 mg total) by mouth daily as needed for pain. With food 30 tablet 1   montelukast (SINGULAIR) 10 MG tablet Take 1 tablet (10 mg total) by mouth daily. 90 tablet 3    Tiotropium Bromide Monohydrate (SPIRIVA RESPIMAT) 2.5 MCG/ACT AERS Inhale 2 puffs into the lungs daily. 4 g 11   No facility-administered medications prior to visit.      ROS:  Review of Systems BREAST: No symptoms   OBJECTIVE:   Vitals:  LMP 01/08/2017 (Exact Date)   Physical Exam  Results: No results found for this or any previous visit (from the past 24 hour(s)).   Assessment/Plan: No diagnosis found.    No orders of the defined types were placed in this encounter.     No follow-ups on file.  Mirren Gest B. Sherilynn Dieu, PA-C 04/01/2022 8:14 PM

## 2022-04-02 ENCOUNTER — Encounter: Payer: Self-pay | Admitting: Obstetrics and Gynecology

## 2022-04-02 ENCOUNTER — Ambulatory Visit: Payer: BC Managed Care – PPO | Admitting: Dermatology

## 2022-04-02 ENCOUNTER — Ambulatory Visit: Payer: BC Managed Care – PPO | Admitting: Obstetrics and Gynecology

## 2022-04-02 VITALS — BP 100/62 | Ht 64.0 in | Wt 139.0 lb

## 2022-04-02 DIAGNOSIS — N941 Unspecified dyspareunia: Secondary | ICD-10-CM

## 2022-04-02 DIAGNOSIS — B9689 Other specified bacterial agents as the cause of diseases classified elsewhere: Secondary | ICD-10-CM | POA: Diagnosis not present

## 2022-04-02 DIAGNOSIS — N760 Acute vaginitis: Secondary | ICD-10-CM | POA: Diagnosis not present

## 2022-04-02 LAB — POCT WET PREP WITH KOH
Clue Cells Wet Prep HPF POC: POSITIVE
KOH Prep POC: POSITIVE — AB
Trichomonas, UA: NEGATIVE
Yeast Wet Prep HPF POC: NEGATIVE

## 2022-04-02 MED ORDER — METRONIDAZOLE 500 MG PO TABS: ORAL_TABLET | ORAL | 0 refills | Status: DC

## 2022-04-14 ENCOUNTER — Encounter: Payer: Self-pay | Admitting: Family Medicine

## 2022-05-21 ENCOUNTER — Other Ambulatory Visit: Payer: Self-pay | Admitting: Family Medicine

## 2022-05-21 DIAGNOSIS — Z1231 Encounter for screening mammogram for malignant neoplasm of breast: Secondary | ICD-10-CM

## 2022-05-22 ENCOUNTER — Encounter: Payer: Self-pay | Admitting: Pulmonary Disease

## 2022-05-29 ENCOUNTER — Encounter: Payer: Self-pay | Admitting: Pulmonary Disease

## 2022-06-10 ENCOUNTER — Telehealth: Payer: Self-pay | Admitting: Family Medicine

## 2022-06-10 DIAGNOSIS — Z0000 Encounter for general adult medical examination without abnormal findings: Secondary | ICD-10-CM

## 2022-06-10 DIAGNOSIS — R7309 Other abnormal glucose: Secondary | ICD-10-CM

## 2022-06-10 DIAGNOSIS — E786 Lipoprotein deficiency: Secondary | ICD-10-CM

## 2022-06-10 DIAGNOSIS — N951 Menopausal and female climacteric states: Secondary | ICD-10-CM

## 2022-06-10 NOTE — Telephone Encounter (Signed)
-----   Message from Velna Hatchet, RT sent at 05/28/2022 10:36 AM EST ----- Regarding: Thu 2/29 lab Patient is scheduled for cpx, please order future labs.  Thanks, Anda Kraft

## 2022-06-11 ENCOUNTER — Other Ambulatory Visit: Payer: BC Managed Care – PPO

## 2022-06-11 ENCOUNTER — Other Ambulatory Visit (INDEPENDENT_AMBULATORY_CARE_PROVIDER_SITE_OTHER): Payer: No Typology Code available for payment source

## 2022-06-11 DIAGNOSIS — R7309 Other abnormal glucose: Secondary | ICD-10-CM | POA: Diagnosis not present

## 2022-06-11 DIAGNOSIS — Z0000 Encounter for general adult medical examination without abnormal findings: Secondary | ICD-10-CM

## 2022-06-11 DIAGNOSIS — E786 Lipoprotein deficiency: Secondary | ICD-10-CM

## 2022-06-11 LAB — COMPREHENSIVE METABOLIC PANEL
ALT: 78 U/L — ABNORMAL HIGH (ref 0–35)
AST: 57 U/L — ABNORMAL HIGH (ref 0–37)
Albumin: 4.1 g/dL (ref 3.5–5.2)
Alkaline Phosphatase: 87 U/L (ref 39–117)
BUN: 13 mg/dL (ref 6–23)
CO2: 30 mEq/L (ref 19–32)
Calcium: 10 mg/dL (ref 8.4–10.5)
Chloride: 103 mEq/L (ref 96–112)
Creatinine, Ser: 0.87 mg/dL (ref 0.40–1.20)
GFR: 78.35 mL/min (ref >60.00)
Glucose, Bld: 83 mg/dL (ref 70–99)
Potassium: 4.6 mEq/L (ref 3.5–5.1)
Sodium: 140 mEq/L (ref 135–145)
Total Bilirubin: 1.3 mg/dL — ABNORMAL HIGH (ref 0.2–1.2)
Total Protein: 6.6 g/dL (ref 6.0–8.3)

## 2022-06-11 LAB — LIPID PANEL
Cholesterol: 179 mg/dL (ref 0–200)
HDL: 48.2 mg/dL (ref >39.00)
LDL Cholesterol: 113 mg/dL — ABNORMAL HIGH (ref 0–99)
NonHDL: 130.5
Total CHOL/HDL Ratio: 4
Triglycerides: 90 mg/dL (ref 0.0–149.0)
VLDL: 18 mg/dL (ref 0.0–40.0)

## 2022-06-11 LAB — CBC WITH DIFFERENTIAL/PLATELET
Basophils Absolute: 0 10*3/uL (ref 0.0–0.1)
Basophils Relative: 0.8 % (ref 0.0–3.0)
Eosinophils Absolute: 0.1 10*3/uL (ref 0.0–0.7)
Eosinophils Relative: 3.1 % (ref 0.0–5.0)
HCT: 43.1 % (ref 36.0–46.0)
Hemoglobin: 14.3 g/dL (ref 12.0–15.0)
Lymphocytes Relative: 40 % (ref 12.0–46.0)
Lymphs Abs: 1.8 10*3/uL (ref 0.7–4.0)
MCHC: 33.2 g/dL (ref 30.0–36.0)
MCV: 91.8 fl (ref 78.0–100.0)
Monocytes Absolute: 0.4 10*3/uL (ref 0.1–1.0)
Monocytes Relative: 8.6 % (ref 3.0–12.0)
Neutro Abs: 2.1 10*3/uL (ref 1.4–7.7)
Neutrophils Relative %: 47.5 % (ref 43.0–77.0)
Platelets: 243 10*3/uL (ref 150.0–400.0)
RBC: 4.7 Mil/uL (ref 3.87–5.11)
RDW: 13.5 % (ref 11.5–15.5)
WBC: 4.5 10*3/uL (ref 4.0–10.5)

## 2022-06-11 LAB — TSH: TSH: 1.54 u[IU]/mL (ref 0.35–5.50)

## 2022-06-12 LAB — HEMOGLOBIN A1C: Hgb A1c MFr Bld: 5.3 % (ref 4.6–6.5)

## 2022-06-15 ENCOUNTER — Encounter: Payer: Self-pay | Admitting: Family Medicine

## 2022-06-19 ENCOUNTER — Ambulatory Visit (INDEPENDENT_AMBULATORY_CARE_PROVIDER_SITE_OTHER): Payer: No Typology Code available for payment source | Admitting: Family Medicine

## 2022-06-19 ENCOUNTER — Encounter: Payer: Self-pay | Admitting: Family Medicine

## 2022-06-19 VITALS — BP 124/68 | HR 57 | Temp 97.6°F | Ht 64.0 in | Wt 138.4 lb

## 2022-06-19 DIAGNOSIS — Z0000 Encounter for general adult medical examination without abnormal findings: Secondary | ICD-10-CM

## 2022-06-19 DIAGNOSIS — H6120 Impacted cerumen, unspecified ear: Secondary | ICD-10-CM | POA: Insufficient documentation

## 2022-06-19 DIAGNOSIS — Z0001 Encounter for general adult medical examination with abnormal findings: Secondary | ICD-10-CM

## 2022-06-19 DIAGNOSIS — H6122 Impacted cerumen, left ear: Secondary | ICD-10-CM

## 2022-06-19 DIAGNOSIS — R7309 Other abnormal glucose: Secondary | ICD-10-CM

## 2022-06-19 DIAGNOSIS — R1011 Right upper quadrant pain: Secondary | ICD-10-CM

## 2022-06-19 DIAGNOSIS — R7401 Elevation of levels of liver transaminase levels: Secondary | ICD-10-CM | POA: Diagnosis not present

## 2022-06-19 NOTE — Progress Notes (Unsigned)
Subjective:    Patient ID: Courtney Pineda, female    DOB: 11-23-1970, 52 y.o.   MRN: [id number]  HPI Here for health maintenance exam and to review chronic medical problems    Wt Readings from Last 3 Encounters:  06/19/22 138 lb 6 oz (62.8 kg)  04/02/22 139 lb (63 kg)  01/16/22 135 lb 6.4 oz (61.4 kg)   23.75 kg/m  Vitals:   06/19/22 1435  BP: 124/68  Pulse: (!) 57  Temp: 97.6 F (36.4 C)  SpO2: 99%   R side is bothering her again  Started again this past week   Has not been consistently to gym for 6 weeks  Went on cruise and then grand child was born   Randel Books is doing well  Holds her on the L  Not very heavy     Immunization History  Administered Date(s) Administered   Influenza Split 01/13/2011, 02/02/2012   Influenza Whole 03/22/2002, 12/27/2009   Influenza,inj,Quad PF,6+ Mos 01/20/2013, 01/26/2014, 01/25/2015, 02/12/2016, 01/27/2017, 01/06/2018, 01/05/2019, 01/17/2020, 01/17/2021, 01/30/2022   Janssen (J&J) SARS-COV-2 Vaccination 07/19/2019   Td 06/15/2002   Tdap 03/14/2013   Health Maintenance Due  Topic Date Due   Hepatitis C Screening  Never done   Mammogram 06/2021 (r breast asymmetry) - repeat views were normal  Had another mam in aug 2023 for L breast lumpiness that was normal  Has annual mammogram scheduled on 3/28 at gyn  Self breast exam : no new lumps   Sees gyn yearly - Alecia  Some vaginal discomfort  Hysterectomy in the past  Has appt this month    Colonoscopy 07/2017   BP Readings from Last 3 Encounters:  06/19/22 124/68  04/02/22 100/62  01/16/22 110/62   Pulse Readings from Last 3 Encounters:  06/19/22 (!) 57  01/16/22 61  11/20/21 65   Elevated glucose  Lab Results  Component Value Date   HGBA1C 5.3 06/11/2022   Up from 5.1 last time   Went on cruise-did not eat as healthy   Trying to get back on track  Lately more snacking with the new baby   H/o elevated transaminases Up from last time  Lab Results   Component Value Date   ALT 78 (H) 06/11/2022   AST 57 (H) 06/11/2022   ALKPHOS 87 06/11/2022   BILITOT 1.3 (H) 06/11/2022   She has had this in the past  Past h/o RUQ and flank pain  Has had GB out in past   Pt asked if singulair could raise liver levels  Avoids tylenol and alcohol  Is holding that   No h/o hepatitis     In past CT and MRI in 2019 showed nl liver   Cholesterol Lab Results  Component Value Date   CHOL 179 06/11/2022   CHOL 168 06/10/2021   CHOL 162 06/03/2020   Lab Results  Component Value Date   HDL 48.20 06/11/2022   HDL 44.30 06/10/2021   HDL 35.80 (L) 06/03/2020   Lab Results  Component Value Date   LDLCALC 113 (H) 06/11/2022   LDLCALC 113 (H) 06/10/2021   LDLCALC 101 (H) 06/03/2020   Lab Results  Component Value Date   TRIG 90.0 06/11/2022   TRIG 52.0 06/10/2021   TRIG 126.0 06/03/2020   Lab Results  Component Value Date   CHOLHDL 4 06/11/2022   CHOLHDL 4 06/10/2021   CHOLHDL 5 06/03/2020   No results found for: "LDLDIRECT"  Review of Systems  Objective:   Physical Exam        Assessment & Plan:

## 2022-06-19 NOTE — Patient Instructions (Addendum)
Let's re check liver tests in 10-14 days  off the singulair  I will do a hepatitis screen then also   I will order an abdominal ultrasound to check on liver  I put the referral in  Please let us know if you don't hear in 1-2 weeks    Try to get back on track with healthy diet and exercise Try to get most of your carbohydrates from produce (with the exception of white potatoes)  Eat less bread/pasta/rice/snack foods/cereals/sweets and other items from the middle of the grocery store (processed carbs)  Use debrox on ears - left ear is full of wax  Give it 2-3 weeks  If not improving we can flush your ear   Follow up with gyn and get your mammogram  Continue vitamin D for bone health

## 2022-06-20 NOTE — Assessment & Plan Note (Signed)
Reviewed health habits including diet and exercise and skin cancer prevention Reviewed appropriate screening tests for age  Also reviewed health mt list, fam hx and immunization status , as well as social and family history   See HPI Labs reviewed Mammogram is planned on 3/28 along with gyn visit  Colonoscopy utd 07/2017  Plans to work on diet /exercise  Working up [employer]

## 2022-06-20 NOTE — Assessment & Plan Note (Signed)
Mild  Has been up and down in past Poss fatty liver  ? If poss worse with new singulair med  Holding this -will plan re check  Also ordered abd [place] to image liver

## 2022-06-20 NOTE — Assessment & Plan Note (Signed)
Worse on left Will try debrox F/u for irrigation if needed

## 2022-06-20 NOTE — Assessment & Plan Note (Signed)
With inc liver enz  Has had complete w/u in past  Symptoms returned Planning abd [place]

## 2022-06-20 NOTE — Assessment & Plan Note (Signed)
Lab Results  Component Value Date   HGBA1C 5.3 06/11/2022   disc imp of low glycemic diet and wt loss to prevent DM2

## 2022-06-22 ENCOUNTER — Encounter: Payer: Self-pay | Admitting: *Deleted

## 2022-06-29 ENCOUNTER — Other Ambulatory Visit: Payer: Self-pay | Admitting: Pulmonary Disease

## 2022-07-03 ENCOUNTER — Other Ambulatory Visit (INDEPENDENT_AMBULATORY_CARE_PROVIDER_SITE_OTHER): Payer: No Typology Code available for payment source

## 2022-07-03 ENCOUNTER — Other Ambulatory Visit: Payer: No Typology Code available for payment source

## 2022-07-03 ENCOUNTER — Ambulatory Visit
Admission: RE | Admit: 2022-07-03 | Discharge: 2022-07-03 | Disposition: A | Discharge status: Home / Self Care | Payer: No Typology Code available for payment source | Source: Ambulatory Visit | Attending: Family Medicine | Admitting: Family Medicine

## 2022-07-03 DIAGNOSIS — R7401 Elevation of levels of liver transaminase levels: Secondary | ICD-10-CM | POA: Diagnosis present

## 2022-07-03 DIAGNOSIS — R1011 Right upper quadrant pain: Secondary | ICD-10-CM | POA: Diagnosis present

## 2022-07-03 NOTE — Addendum Note (Signed)
Addended by: Ellamae Sia on: 07/03/2022 03:07 PM   Modules accepted: Orders

## 2022-07-03 NOTE — Addendum Note (Signed)
Addended by: Ellamae Sia on: 07/03/2022 03:08 PM   Modules accepted: Orders

## 2022-07-06 ENCOUNTER — Encounter: Payer: Self-pay | Admitting: Pulmonary Disease

## 2022-07-07 LAB — HEPATIC FUNCTION PANEL
AG Ratio: 1.6 (calc) (ref 1.0–2.5)
ALT: 17 U/L (ref 6–29)
AST: 23 U/L (ref 10–35)
Albumin: 4.7 g/dL (ref 3.6–5.1)
Alkaline phosphatase (APISO): 82 U/L (ref 31–125)
Bilirubin, Direct: 0.2 mg/dL (ref 0.0–0.2)
Globulin: 2.9 g/dL (calc) (ref 1.9–3.7)
Indirect Bilirubin: 1 mg/dL (calc) (ref 0.2–1.2)
Total Bilirubin: 1.2 mg/dL (ref 0.2–1.2)
Total Protein: 7.6 g/dL (ref 6.1–8.1)

## 2022-07-07 LAB — REFLEX TIQ

## 2022-07-07 LAB — ACUTE HEP PANEL AND HEP B SURFACE AB
HEPATITIS C ANTIBODY REFILL$(REFL): NONREACTIVE
Hep A IgM: NONREACTIVE
Hep B C IgM: NONREACTIVE
Hepatitis B Surface Ag: NONREACTIVE

## 2022-07-07 LAB — FERRITIN: Ferritin: 121 ng/mL (ref 16–232)

## 2022-07-07 NOTE — Telephone Encounter (Signed)
Noted  

## 2022-07-07 NOTE — Telephone Encounter (Signed)
Routing to Dr. Gonzalez to make aware.  

## 2022-07-08 NOTE — Progress Notes (Unsigned)
PCP: Abner Greenspan, MD   No chief complaint on file.   HPI:      [name] is a 52 y.o. [id number] whose LMP was Patient's last menstrual period was 01/08/2017 (exact date)., presents today for her annual examination.  Her menses are absent due to total lap hyst with salpingectomy 2018 with Dr. Georgianne Fick for AUB/leio. No PMB. Doing well. She does have vasomotor sx.   Sex activity: single partner, contraception - status post hysterectomy. She does have vaginal dryness and pain, hasn't used lubricants.  Pt also with chronic vaginal itching, sometimes with increased d/c. Using dove sens skin soap, uses dryer sheets, wearing pads daily for SUI, using synthetic underwear. Has had neg yeast cultures in past, no response with meds given to pt including nystatin and terconazole (per chart, pt didn't remember names).  Pt also with SUI, has chronic mild cough. Wears pantyliners. Hasn't tried kegel exercises.   Last Pap: 11/25/16  Results were: no abnormalities /neg HPV DNA.  Hx of STDs: none  Last mammogram: today; awaiting results; 07/30/21  Results were: normal--routine follow-up in 12 months; neg mammo and u/s LT breast for overall lumpiness 8/23 There is a FH of breast cancer in her MGM and pat aunt, genetic testing not indicated for pt. There is no FH of ovarian cancer. The patient does do self-breast exams.  Colonoscopy: 2019 with Dr. Marius Ditch; Repeat due after 10 years.   Tobacco use: The patient denies current or previous tobacco use. Alcohol use: none No drug use. Exercise: moderately active  She does get adequate calcium and Vitamin D in her diet.  Labs with PCP.   Patient Active Problem List   Diagnosis Date Noted   Cerumen impaction 06/19/2022   SUI (stress urinary incontinence, female) 07/07/2021   Encounter for screening mammogram for breast cancer 04/30/2021   Asthma 11/12/2020   DOE (dyspnea on exertion) 07/09/2020   Allergic rhinitis 12/25/2019   Vaccine  reaction 07/26/2019   Perimenopause 06/08/2018   Low HDL (under 40) 06/08/2018   [place] intestinal bacterial overgrowth 02/09/2018   Cyst of right kidney 06/04/2017   RUQ abdominal pain 05/31/2017   Elevated glucose level 05/22/2017   S/P laparoscopic hysterectomy 01/14/2017   Daily headache 06/01/2016   Chronic cough 06/01/2016   Encounter for routine gynecological examination 05/23/2014   Burning sensation of mouth 09/12/2013   Elevated transaminase level 01/13/2011   Encounter for routine adult medical exam with abnormal findings 01/05/2011   KIDNEY STONE 03/27/2010   STRESS REACTION, ACUTE, WITH EMOTIONAL DISTURBANCE 09/13/2007   HEADACHE 09/13/2007    Past Surgical History:  Procedure Laterality Date   ABDOMINAL HYSTERECTOMY     CHOLECYSTECTOMY  06/1994   COLONOSCOPY WITH PROPOFOL N/A 07/12/2017   Procedure: COLONOSCOPY WITH PROPOFOL;  Surgeon: Lin Landsman, MD;  Location: [place];  Service: Gastroenterology;  Laterality: N/A;   CYSTOSCOPY N/A 01/14/2017   Procedure: CYSTOSCOPY;  Surgeon: Malachy Mood, MD;  Location: [facility] ORS;  Service: Gynecology;  Laterality: N/A;   ESOPHAGOGASTRODUODENOSCOPY (EGD) WITH PROPOFOL N/A 07/12/2017   Procedure: ESOPHAGOGASTRODUODENOSCOPY (EGD) WITH PROPOFOL;  Surgeon: Lin Landsman, MD;  Location: [facility] ENDOSCOPY;  Service: Gastroenterology;  Laterality: N/A;   KIDNEY STONE SURGERY     LITHOTRIPSY  03/2006   TONSILECTOMY, ADENOIDECTOMY, BILATERAL MYRINGOTOMY AND TUBES     myringotomy tubes   TOTAL LAPAROSCOPIC HYSTERECTOMY WITH SALPINGECTOMY Bilateral 01/14/2017   Procedure: HYSTERECTOMY TOTAL LAPAROSCOPIC WITH SALPINGECTOMY;  Surgeon: Malachy Mood, MD;  Location: [facility]  ORS;  Service: Gynecology;  Laterality: Bilateral;    Family History  Problem Relation Age of Onset   Migraines Mother    Cancer Maternal Grandmother 37       brain tumor/breast cancer   Breast cancer Maternal Grandmother    Heart disease Maternal  Grandfather        heart problems   Breast cancer Paternal Aunt 39       Contact   Colon cancer Neg Hx    Esophageal cancer Neg Hx    Liver disease Neg Hx    Pancreatic cancer Neg Hx    Stomach cancer Neg Hx    Inflammatory bowel disease Neg Hx     Social History   Socioeconomic History   Marital status: Married    Spouse name: Not on file   Number of children: 2   Years of education: Not on file   Highest education level: Not on file  Occupational History   Occupation: insurance    Employer: [employer]  Tobacco Use   Smoking status: Never   Smokeless tobacco: Never  Vaping Use   Vaping Use: Never used  Substance and Sexual Activity   Alcohol use: No    Alcohol/week: 0.0 standard drinks of alcohol   Drug use: No   Sexual activity: Yes    Birth control/protection: Surgical    Comment: Hysterectomy  Other Topics Concern   Not on file  Social History Narrative   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, she is an Theatre manager   Rare caffeine and never smoker no alcohol no drug or substance use   Social Determinants of Radio broadcast assistant Strain: Not on file  Food Insecurity: Not on file  Transportation Needs: Not on file  Physical Activity: Insufficiently Active (03/02/2017)   Exercise Vital Sign    Days of Exercise per Week: 3 days    Minutes of Exercise per Session: 30 min  Stress: No Stress Concern Present (03/02/2017)   [employer] [place]    Feeling of Stress : Only a little  Social Connections: Moderately Integrated (03/02/2017)   Social Connection and Isolation Panel [NHANES]    Frequency of Communication with Friends and Family: More than three times a week    Frequency of Social Gatherings with Friends and Family: More than three times a week    Attends Religious Services: More than 4 times per year    Active Member of [employer] or Organizations: No    Attends  Archivist Meetings: Never    Marital Status: Married  Human resources officer Violence: Not At Risk (03/02/2017)   Humiliation, Afraid, Rape, and Kick questionnaire    Fear of Current or Ex-Partner: No    Emotionally Abused: No    Physically Abused: No    Sexually Abused: No     Current Outpatient Medications:    acetaminophen (TYLENOL) 500 MG tablet, Take 500 mg by mouth every 6 (six) hours as needed., Disp: , Rfl:    albuterol (VENTOLIN HFA) 108 (90 Base) MCG/ACT inhaler, Inhale 2 puffs into the lungs every 6 (six) hours as needed for wheezing or shortness of breath., Disp: 8 g, Rfl: 2   budesonide-formoterol (SYMBICORT) 160-4.5 MCG/ACT inhaler, Inhale 2 puffs into the lungs 2 (two) times daily., Disp: 1 each, Rfl: 6   Cholecalciferol (D3-1000 PO), Take by mouth., Disp: , Rfl:    montelukast (SINGULAIR) 10 MG tablet, Take  1 tablet (10 mg total) by mouth daily., Disp: 90 tablet, Rfl: 3     ROS:  Review of Systems  Constitutional:  Negative for fatigue, fever and unexpected weight change.  Respiratory:  Positive for cough. Negative for shortness of breath and wheezing.   Cardiovascular:  Negative for chest pain, palpitations and leg swelling.  Gastrointestinal:  Negative for blood in stool, constipation, diarrhea, nausea and vomiting.  Endocrine: Negative for cold intolerance, heat intolerance and polyuria.  Genitourinary:  Positive for dyspareunia. Negative for dysuria, flank pain, frequency, genital sores, hematuria, menstrual problem, pelvic pain, urgency, vaginal bleeding, vaginal discharge and vaginal pain.  Musculoskeletal:  Negative for back pain, joint swelling and myalgias.  Skin:  Negative for rash.  Neurological:  Negative for dizziness, syncope, light-headedness, numbness and headaches.  Hematological:  Negative for adenopathy.  Psychiatric/Behavioral:  Negative for agitation, confusion, sleep disturbance and suicidal ideas. The patient is not nervous/anxious.     BREAST: No symptoms    Objective: LMP 01/08/2017 (Exact Date)    Physical Exam Constitutional:      Appearance: She is well-developed.  Genitourinary:     Vulva normal.     Genitourinary Comments: UTERUS/CX SURG REM; MILD CYSTOCELE AND RECTOCELE ON EXAM     Right Labia: No rash, tenderness or lesions.    Left Labia: No tenderness, lesions or rash.    Vaginal cuff intact.    No vaginal discharge, erythema or tenderness.     Anterior and posterior vaginal prolapse present.    No vaginal atrophy present.     Right Adnexa: not tender and no mass present.    Left Adnexa: not tender and no mass present.    Cervix is absent.     Uterus is absent.  Breasts:    Right: No mass, nipple discharge, skin change or tenderness.     Left: No mass, nipple discharge, skin change or tenderness.  Neck:     Thyroid: No thyromegaly.  Cardiovascular:     Rate and Rhythm: Normal rate and regular rhythm.     Heart sounds: Normal heart sounds. No murmur heard. Pulmonary:     Effort: Pulmonary effort is normal.     Breath sounds: Normal breath sounds.  Abdominal:     Palpations: Abdomen is soft.     Tenderness: There is no abdominal tenderness. There is no guarding.  Musculoskeletal:        General: Normal range of motion.     Cervical back: Normal range of motion.  Neurological:     General: No focal deficit present.     Mental Status: She is alert and oriented to person, place, and time.     Cranial Nerves: No cranial nerve deficit.  Skin:    General: Skin is warm and dry.  Psychiatric:        Mood and Affect: Mood normal.        Behavior: Behavior normal.        Thought Content: Thought content normal.        Judgment: Judgment normal.  Vitals reviewed.     Assessment/Plan:  Encounter for annual routine gynecological examination  Encounter for screening mammogram for malignant neoplasm of breast; pt had mammo today  Family history of breast cancer--pt doesn't qualify for  cancer genetic testing.   Pelvic relaxation--try kegels.   SUI (stress urinary incontinence, female)--start kegels, f/u prn. Minimize pad use due to vag sx.  Dyspareunia in female--due to decreased lubrication. Add lubricants/coconut oil. Treat vaginal  itching to see if sx improve.   Vaginal itching--neg exam. Most likely chem derm. Line dry underwear, cotton underwear, minimize pad use; OTC hydrocortisone crm. F/u prn.           GYN counsel breast self exam, mammography screening, menopause, adequate intake of calcium and vitamin D, diet and exercise    F/U  No follow-ups on file.  Courtney Deans B. Leverett Camplin, PA-C 07/08/2022 5:50 PM

## 2022-07-09 ENCOUNTER — Ambulatory Visit
Admission: RE | Admit: 2022-07-09 | Discharge: 2022-07-09 | Disposition: A | Discharge status: Home / Self Care | Payer: No Typology Code available for payment source | Source: Ambulatory Visit | Attending: Family Medicine | Admitting: Family Medicine

## 2022-07-09 ENCOUNTER — Encounter: Payer: Self-pay | Admitting: Obstetrics and Gynecology

## 2022-07-09 ENCOUNTER — Ambulatory Visit: Payer: No Typology Code available for payment source | Admitting: Obstetrics and Gynecology

## 2022-07-09 ENCOUNTER — Other Ambulatory Visit: Payer: Self-pay | Admitting: Obstetrics and Gynecology

## 2022-07-09 VITALS — BP 110/70 | Ht 64.0 in | Wt 140.0 lb

## 2022-07-09 DIAGNOSIS — N393 Stress incontinence (female) (male): Secondary | ICD-10-CM

## 2022-07-09 DIAGNOSIS — Z1231 Encounter for screening mammogram for malignant neoplasm of breast: Secondary | ICD-10-CM | POA: Diagnosis not present

## 2022-07-09 DIAGNOSIS — Z01419 Encounter for gynecological examination (general) (routine) without abnormal findings: Secondary | ICD-10-CM | POA: Diagnosis not present

## 2022-07-09 DIAGNOSIS — Z803 Family history of malignant neoplasm of breast: Secondary | ICD-10-CM

## 2022-07-09 DIAGNOSIS — N898 Other specified noninflammatory disorders of vagina: Secondary | ICD-10-CM

## 2022-07-09 DIAGNOSIS — N941 Unspecified dyspareunia: Secondary | ICD-10-CM

## 2022-07-09 MED ORDER — PREMARIN 0.625 MG/GM VA CREA: TOPICAL_CREAM | VAGINAL | 1 refills | Status: DC

## 2022-07-09 MED ORDER — ESTRADIOL 0.1 MG/GM VA CREA: TOPICAL_CREAM | VAGINAL | 1 refills | Status: DC

## 2022-07-09 NOTE — Addendum Note (Signed)
Addended by: Ardeth Perfect B on: [id number] 04:03 PM   Modules accepted: Orders

## 2022-07-09 NOTE — Patient Instructions (Signed)
I value your feedback and you entrusting us with your care. If you get a [facility] patient survey, I would appreciate you taking the time to let us know about your experience today. Thank you! ? ? ?

## 2022-08-02 ENCOUNTER — Ambulatory Visit
Admission: RE | Admit: 2022-08-02 | Discharge: 2022-08-02 | Disposition: A | Discharge status: Home / Self Care | Payer: No Typology Code available for payment source | Source: Ambulatory Visit | Attending: Emergency Medicine | Admitting: Emergency Medicine

## 2022-08-02 VITALS — BP 101/68 | HR 73 | Temp 97.8°F | Resp 18

## 2022-08-02 DIAGNOSIS — J0100 Acute maxillary sinusitis, unspecified: Secondary | ICD-10-CM | POA: Diagnosis not present

## 2022-08-02 MED ORDER — AZITHROMYCIN 250 MG PO TABS: 250.0000 mg | ORAL_TABLET | Freq: Every day | ORAL | 0 refills | Status: DC

## 2022-08-02 NOTE — ED Provider Notes (Signed)
Renaldo Fiddler    CSN: [id number] Arrival date & time: 08/02/22  1158      History   Chief Complaint Chief Complaint  Patient presents with   Nasal Congestion    Entered by patient    HPI Courtney Pineda is a 52 y.o. female.  Patient presents with 5 to 6 day history of congestion, sinus pressure, postnasal drip, runny nose, mild cough.  She denies fever, chills, rash, sore throat, wheezing, shortness of breath, or other symptoms.  Several OTC medications attempted.  She has not needed to use her rescue inhaler.  Her medical history includes asthma and allergies.  The history is provided by the patient and medical records.    Past Medical History:  Diagnosis Date   Anemia    Asthma    Chronic right upper quadrant pain    Dysplastic nevus 09/16/2015   left infra axillary   Dysplastic nevus 10/27/2017   left side   History of kidney stones    Hyperemesis gravidarum 1999   [place] intestinal bacterial overgrowth 02/09/2018    Patient Active Problem List   Diagnosis Date Noted   Cerumen impaction 06/19/2022   SUI (stress urinary incontinence, female) 07/07/2021   Encounter for screening mammogram for breast cancer 04/30/2021   Asthma 11/12/2020   DOE (dyspnea on exertion) 07/09/2020   Allergic rhinitis 12/25/2019   Vaccine reaction 07/26/2019   Perimenopause 06/08/2018   Low HDL (under 40) 06/08/2018   [place] intestinal bacterial overgrowth 02/09/2018   Cyst of right kidney 06/04/2017   RUQ abdominal pain 05/31/2017   Elevated glucose level 05/22/2017   S/P laparoscopic hysterectomy 01/14/2017   Daily headache 06/01/2016   Chronic cough 06/01/2016   Encounter for routine gynecological examination 05/23/2014   Burning sensation of mouth 09/12/2013   Elevated transaminase level 01/13/2011   Encounter for routine adult medical exam with abnormal findings 01/05/2011   KIDNEY STONE 03/27/2010   STRESS REACTION, ACUTE, WITH EMOTIONAL DISTURBANCE 09/13/2007    HEADACHE 09/13/2007    Past Surgical History:  Procedure Laterality Date   ABDOMINAL HYSTERECTOMY     CHOLECYSTECTOMY  06/1994   COLONOSCOPY WITH PROPOFOL N/A 07/12/2017   Procedure: COLONOSCOPY WITH PROPOFOL;  Surgeon: Toney Reil, MD;  Location: [facility] ENDOSCOPY;  Service: Gastroenterology;  Laterality: N/A;   CYSTOSCOPY N/A 01/14/2017   Procedure: CYSTOSCOPY;  Surgeon: Vena Austria, MD;  Location: [facility] ORS;  Service: Gynecology;  Laterality: N/A;   ESOPHAGOGASTRODUODENOSCOPY (EGD) WITH PROPOFOL N/A 07/12/2017   Procedure: ESOPHAGOGASTRODUODENOSCOPY (EGD) WITH PROPOFOL;  Surgeon: Toney Reil, MD;  Location: [facility] ENDOSCOPY;  Service: Gastroenterology;  Laterality: N/A;   KIDNEY STONE SURGERY     LITHOTRIPSY  03/2006   TONSILECTOMY, ADENOIDECTOMY, BILATERAL MYRINGOTOMY AND TUBES     myringotomy tubes   TOTAL LAPAROSCOPIC HYSTERECTOMY WITH SALPINGECTOMY Bilateral 01/14/2017   Procedure: HYSTERECTOMY TOTAL LAPAROSCOPIC WITH SALPINGECTOMY;  Surgeon: Vena Austria, MD;  Location: [facility] ORS;  Service: Gynecology;  Laterality: Bilateral;    OB History     Gravida  2   Para  2   Term  2   Preterm  0   AB  0   Living  2      SAB  0   IAB  0   Ectopic  0   Multiple  0   Live Births  2            Home Medications    Prior to Admission medications   Medication Sig Start Date  End Date Taking? Authorizing Provider  azithromycin (ZITHROMAX) 250 MG tablet Take 1 tablet (250 mg total) by mouth daily. Take first 2 tablets together, then 1 every day until finished. 08/02/22  Yes Mickie Bail, NP  acetaminophen (TYLENOL) 500 MG tablet Take 500 mg by mouth every 6 (six) hours as needed.    [provider]  albuterol (VENTOLIN HFA) 108 (90 Base) MCG/ACT inhaler Inhale 2 puffs into the lungs every 6 (six) hours as needed for wheezing or shortness of breath. 11/20/21   Salena Saner, MD  budesonide-formoterol [facility]) 160-4.5 MCG/ACT inhaler  Inhale 2 puffs into the lungs 2 (two) times daily. Patient not taking: Reported on 07/09/2022 03/18/21   Salena Saner, MD  Cholecalciferol (D3-1000 PO) Take by mouth.    [provider]  estradiol (ESTRACE VAGINAL) 0.1 MG/GM vaginal cream Insert 1 g vaginally once weekly as maintenance. 07/09/22   Copland, Helmut Muster B, PA-C  montelukast (SINGULAIR) 10 MG tablet Take 1 tablet (10 mg total) by mouth daily. Patient not taking: Reported on 07/09/2022 04/23/21 06/18/25  Salena Saner, MD    Family History Family History  Problem Relation Age of Onset   Migraines Mother    Cancer Maternal Grandmother 7       brain tumor/breast cancer   Breast cancer Maternal Grandmother    Heart disease Maternal Grandfather        heart problems   Breast cancer Paternal Aunt 14       Contact   Colon cancer Neg Hx    Esophageal cancer Neg Hx    Liver disease Neg Hx    Pancreatic cancer Neg Hx    Stomach cancer Neg Hx    Inflammatory bowel disease Neg Hx     Social History Social History   Tobacco Use   Smoking status: Never   Smokeless tobacco: Never  Vaping Use   Vaping Use: Never used  Substance Use Topics   Alcohol use: No    Alcohol/week: 0.0 standard drinks of alcohol   Drug use: No     Allergies   Amoxicillin, Bee venom, Sulfa antibiotics, Sulfonamide derivatives, Covid-19 (mrna) vaccine, and Penicillins   Review of Systems Review of Systems  Constitutional:  Negative for chills and fever.  HENT:  Positive for congestion, postnasal drip, rhinorrhea and sinus pressure. Negative for ear pain and sore throat.   Respiratory:  Positive for cough. Negative for shortness of breath and wheezing.   Cardiovascular:  Negative for chest pain and palpitations.  Gastrointestinal:  Negative for diarrhea and vomiting.  Skin:  Negative for rash.  All other systems reviewed and are negative.    Physical Exam Triage Vital Signs ED Triage Vitals  Enc Vitals Group     BP 08/02/22  1215 101/68     Pulse Rate 08/02/22 1206 73     Resp 08/02/22 1206 18     Temp 08/02/22 1206 97.8 F (36.6 C)     Temp src --      SpO2 08/02/22 1206 96 %     Weight --      Height --      Head Circumference --      Peak Flow --      Pain Score 08/02/22 1209 10     Pain Loc --      Pain Edu? --      Excl. in GC? --    No data found.  Updated Vital Signs BP 101/68  Pulse 73   Temp 97.8 F (36.6 C)   Resp 18   LMP 01/08/2017 (Exact Date)   SpO2 96%   Visual Acuity Right Eye Distance:   Left Eye Distance:   Bilateral Distance:    Right Eye Near:   Left Eye Near:    Bilateral Near:     Physical Exam Vitals and nursing note reviewed.  Constitutional:      General: She is not in acute distress.    Appearance: Normal appearance. She is well-developed. She is not ill-appearing.  HENT:     Right Ear: Tympanic membrane normal.     Left Ear: Tympanic membrane normal.     Nose: Congestion and rhinorrhea present.     Mouth/Throat:     Mouth: Mucous membranes are moist.     Pharynx: Oropharynx is clear.  Cardiovascular:     Rate and Rhythm: Normal rate and regular rhythm.     Heart sounds: Normal heart sounds.  Pulmonary:     Effort: Pulmonary effort is normal. No respiratory distress.     Breath sounds: Normal breath sounds. No wheezing.  Musculoskeletal:     Cervical back: Neck supple.  Skin:    General: Skin is warm and dry.  Neurological:     Mental Status: She is alert.  Psychiatric:        Mood and Affect: Mood normal.        Behavior: Behavior normal.      UC Treatments / Results  Labs (all labs ordered are listed, but only abnormal results are displayed) Labs Reviewed - No data to display  EKG   Radiology No results found.  Procedures Procedures (including critical care time)  Medications Ordered in UC Medications - No data to display  Initial Impression / Assessment and Plan / UC Course  I have reviewed the triage vital signs and the  nursing notes.  Pertinent labs & imaging results that were available during my care of the patient were reviewed by me and considered in my medical decision making (see chart for details).   Acute sinusitis.  Afebrile and vital signs are stable.  Lungs are clear, no wheezing.  Per patient preference, treating today with Zithromax as she reports this is worked well for her in the past.  Education provided on sinus infection.  Instructed patient to follow up with her PCP if her symptoms are not improving.  She agrees to plan of care.    Final Clinical Impressions(s) / UC Diagnoses   Final diagnoses:  Acute non-recurrent maxillary sinusitis     Discharge Instructions      Take the Zithromax as directed.  Follow up with your primary care provider if your symptoms are not improving.        ED Prescriptions     Medication Sig Dispense Auth. Provider   azithromycin (ZITHROMAX) 250 MG tablet Take 1 tablet (250 mg total) by mouth daily. Take first 2 tablets together, then 1 every day until finished. 6 tablet Mickie Bail, NP      PDMP not reviewed this encounter.   Mickie Bail, NP 08/02/22 1246

## 2022-08-02 NOTE — ED Triage Notes (Signed)
Patient to Urgent Care with complaints of nasal congestion/ sinus pain that started Tuesday after walking outside. Nasal and eye drainage. Hx of seasonal allergies. Has been taking otc allergy medications/ sudafed. Denies any fevers.

## 2022-08-02 NOTE — Discharge Instructions (Addendum)
Take the Zithromax as directed.  Follow up with your primary care provider if your symptoms are not improving.   ° ° °

## 2022-08-20 ENCOUNTER — Ambulatory Visit (INDEPENDENT_AMBULATORY_CARE_PROVIDER_SITE_OTHER): Payer: 59 | Admitting: Pulmonary Disease

## 2022-08-20 ENCOUNTER — Encounter: Payer: Self-pay | Admitting: Pulmonary Disease

## 2022-08-20 VITALS — BP 120/70 | HR 54 | Temp 97.9°F | Ht 64.0 in | Wt 139.4 lb

## 2022-08-20 DIAGNOSIS — J4530 Mild persistent asthma, uncomplicated: Secondary | ICD-10-CM

## 2022-08-20 DIAGNOSIS — J301 Allergic rhinitis due to pollen: Secondary | ICD-10-CM | POA: Diagnosis not present

## 2022-08-20 DIAGNOSIS — R053 Chronic cough: Secondary | ICD-10-CM | POA: Diagnosis not present

## 2022-08-20 DIAGNOSIS — J45991 Cough variant asthma: Secondary | ICD-10-CM

## 2022-08-20 LAB — NITRIC OXIDE: Nitric Oxide: 7

## 2022-08-20 MED ORDER — BREO ELLIPTA 100-25 MCG/ACT IN AEPB: 1.0000 | INHALATION_SPRAY | Freq: Every day | RESPIRATORY_TRACT | 11 refills | Status: DC

## 2022-08-20 NOTE — Patient Instructions (Addendum)
For allergies I recommend either Allegra or Zyrtec over-the-counter.  Claritin is also another choice.  The Zyrtec may be a little bit more sedating so I would recommend taking it at nighttime if you choose this one.  We are restarting inhalers.   A prescription for Courtney Pineda was sent to your pharmacy.  This is 1 puff daily.  Make sure you rinse your mouth well after you use it.  It does not matter what time of the day you take it as long as you take it pretty much at the same time every day.  Use your rescue inhaler if you notice chest tightness or cough.  We are scheduling for breathing test.  We will see you in follow-up in 3 to 4 months time call sooner should any new problems arise.

## 2022-08-20 NOTE — Progress Notes (Signed)
Subjective:    Patient ID: Courtney Pineda, female    DOB: [birth year], 52 y.o.   MRN: [id number] Patient Care Team: Judy Pimple, MD as PCP - General  Chief Complaint  Patient presents with   Follow-up    Occasional SOB and chest tightness. No wheezing. Cough with clear sputum.    HPI Courtney Pineda is a 52 year old lifelong never smoker who presents for follow-up on the issue of chronic cough.  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  Last evaluated here on 20 November 2021 at that time she wanted to discontinue her inhaler therapy and remain just on albuterol as needed.  She was instructed to continue montelukast.  However, she was noted to have some elevation of liver function on routine follow-up with her primary physician and her montelukast was discontinued.  Her liver function returned to normal.  She has only been on as needed albuterol but not using it at all.  She had been instructed to resume taking Symbicort if her cough returned.  She however has not been using either the rescue nor Symbicort and her cough has returned.  She also notes occasional episodes of chest tightness and shortness of breath.  She had been doing well with the regimen.  She was seen at urgent care on 21 April with an acute sinusitis.  She has been treated with azithromycin and this has helped her sinus symptoms.  Her cough however lingers.  Cough is nonproductive. She has not had any fevers, chills or sweats.  Cough has persisted despite treatment of sinusitis. No hemoptysis.  She does not endorse any other symptomatology today.  When I asked her what the issue was with Symbicort she states that she just forgets to take it twice a day.  Review of Systems A 10 point review of systems was performed and it is as noted above otherwise negative.  Patient Active Problem List   Diagnosis Date Noted   Cerumen impaction 06/19/2022   SUI (stress urinary incontinence, female) 07/07/2021    Encounter for screening mammogram for breast cancer 04/30/2021   Asthma 11/12/2020   DOE (dyspnea on exertion) 07/09/2020   Allergic rhinitis 12/25/2019   Vaccine reaction 07/26/2019   Perimenopause 06/08/2018   Low HDL (under 40) 06/08/2018   [place] intestinal bacterial overgrowth 02/09/2018   Cyst of right kidney 06/04/2017   RUQ abdominal pain 05/31/2017   Elevated glucose level 05/22/2017   S/P laparoscopic hysterectomy 01/14/2017   Daily headache 06/01/2016   Chronic cough 06/01/2016   Encounter for routine gynecological examination 05/23/2014   Burning sensation of mouth 09/12/2013   Elevated transaminase level 01/13/2011   Encounter for routine adult medical exam with abnormal findings 01/05/2011   KIDNEY STONE 03/27/2010   STRESS REACTION, ACUTE, WITH EMOTIONAL DISTURBANCE 09/13/2007   HEADACHE 09/13/2007   Social History   Tobacco Use   Smoking status: Never   Smokeless tobacco: Never  Substance Use Topics   Alcohol use: No    Alcohol/week: 0.0 standard drinks of alcohol   Allergies  Allergen Reactions   Amoxicillin Anaphylaxis   Bee Venom Anaphylaxis   Sulfa Antibiotics Anaphylaxis   Sulfonamide Derivatives Anaphylaxis    Lungs close up   Covid-19 (Mrna) Vaccine     J and J Local reaction    Penicillins Rash    Has patient had a PCN reaction causing immediate rash, facial/tongue/throat swelling, SOB or lightheadedness with hypotension: No Has patient had a PCN reaction causing severe  rash involving mucus membranes or skin necrosis: No Has patient had a PCN reaction that required hospitalization: No Has patient had a PCN reaction occurring within the last 10 years: No If all of the above answers are "NO", then may proceed with Cephalosporin use.    Current Meds  Medication Sig   acetaminophen (TYLENOL) 500 MG tablet Take 500 mg by mouth every 6 (six) hours as needed.   albuterol (VENTOLIN HFA) 108 (90 Base) MCG/ACT inhaler Inhale 2 puffs into the lungs  every 6 (six) hours as needed for wheezing or shortness of breath.   Cholecalciferol (D3-1000 PO) Take by mouth.   estradiol (ESTRACE VAGINAL) 0.1 MG/GM vaginal cream Insert 1 g vaginally once weekly as maintenance.   Immunization History  Administered Date(s) Administered   Influenza Split 01/13/2011, 02/02/2012   Influenza Whole 03/22/2002, 12/27/2009   Influenza,inj,Quad PF,6+ Mos 01/20/2013, 01/26/2014, 01/25/2015, 02/12/2016, 01/27/2017, 01/06/2018, 01/05/2019, 01/17/2020, 01/17/2021, 01/30/2022   Janssen (J&J) SARS-COV-2 Vaccination 07/19/2019   Td 06/15/2002   Tdap 03/14/2013       Objective:   Physical Exam BP 120/70 (BP Location: Left Arm, Cuff Size: Normal)   Pulse (!) 54   Temp 97.9 F (36.6 C)   Ht 5\' 4"  (1.626 m)   Wt 139 lb 6.4 oz (63.2 kg)   LMP 01/08/2017 (Exact Date)   SpO2 98%   BMI 23.93 kg/m   SpO2: 98 % O2 Device: None (Room air)  GENERAL: Well-developed, well-nourished woman, no acute distress fully ambulatory.  No conversational dyspnea. Intermittent dry cough.  Almost constant clearing of the throat.  Nasal quality to the speech. HEAD: Normocephalic, atraumatic.  EYES: Pupils equal, round, reactive to light.  No scleral icterus.  Conjunctiva clear. MOUTH: Dentition intact, oral mucosa moist. NECK: Supple. No thyromegaly. Trachea midline. No JVD.  No adenopathy. PULMONARY: Slightly diminished bilaterally.  No adventitious sounds. CARDIOVASCULAR: S1 and S2. Regular rate and rhythm.  No rubs, murmurs or gallops heard. ABDOMEN: Benign. MUSCULOSKELETAL: No joint deformity, no clubbing, no edema.  NEUROLOGIC: No focal deficit, no gait disturbance, speech is fluent. SKIN: Intact,warm,dry. PSYCH: Mood and behavior normal.  Lab Results  Component Value Date   NITRICOXIDE 7 08/20/2022      Assessment & Plan:     ICD-10-CM   1. Cough variant asthma  J45.991 Nitric oxide    Pulmonary Function Test [facility] Only   Resume LABA/ICS Breo 100, 1 inhalation  daily Use albuterol as needed for shortness of breath and/or cough    2. Mild persistent asthma without complication  J45.30 Pulmonary Function Test [facility] Only   Noneosinophilic (non type II) asthma Reassess with PFTs Management as above    3. Seasonal allergic rhinitis due to pollen  J30.1    Nasal hygiene Allegra or Zyrtec OTC     Orders Placed This Encounter  Procedures   Nitric oxide   Pulmonary Function Test [facility] Only    Standing Status:   Future    Standing Expiration Date:   08/20/2023    Order Specific Question:   Full PFT: includes the following: basic spirometry, spirometry pre & post bronchodilator, diffusion capacity (DLCO), lung volumes    Answer:   Full PFT    Order Specific Question:   This test can only be performed at    Answer:   [facility]   Meds ordered this encounter  Medications   BREO ELLIPTA 100-25 MCG/ACT AEPB    Sig: Inhale 1 puff into the lungs daily.  Dispense:  28 each    Refill:  11   Will see the patient in follow-up in 3 to 4 months time call sooner should any new problems arise.  Gailen Shelter, MD Advanced Bronchoscopy [facility]-    *This note was dictated using voice recognition software/Dragon.  Despite best efforts to proofread, errors can occur which can change the meaning. Any transcriptional errors that result from this process are unintentional and may not be fully corrected at the time of dictation.

## 2022-10-01 NOTE — Telephone Encounter (Signed)
Called patient. She is unable to get into her calendar at this time and will call back to reschedule. aw

## 2022-10-15 ENCOUNTER — Encounter: Payer: Self-pay | Admitting: Obstetrics and Gynecology

## 2022-10-29 ENCOUNTER — Ambulatory Visit: Payer: BC Managed Care – PPO | Admitting: Dermatology

## 2022-10-29 NOTE — Progress Notes (Unsigned)
Tower, Audrie Gallus, MD   No chief complaint on file.   HPI:      [name] is a 52 y.o. [id number] whose LMP was Patient's last menstrual period was 01/08/2017 (exact date)., presents today for PMB after hyst    Patient Active Problem List   Diagnosis Date Noted   Cerumen impaction 06/19/2022   SUI (stress urinary incontinence, female) 07/07/2021   Encounter for screening mammogram for breast cancer 04/30/2021   Asthma 11/12/2020   DOE (dyspnea on exertion) 07/09/2020   Allergic rhinitis 12/25/2019   Vaccine reaction 07/26/2019   Perimenopause 06/08/2018   Low HDL (under 40) 06/08/2018   [place] intestinal bacterial overgrowth 02/09/2018   Cyst of right kidney 06/04/2017   RUQ abdominal pain 05/31/2017   Elevated glucose level 05/22/2017   S/P laparoscopic hysterectomy 01/14/2017   Daily headache 06/01/2016   Chronic cough 06/01/2016   Encounter for routine gynecological examination 05/23/2014   Burning sensation of mouth 09/12/2013   Elevated transaminase level 01/13/2011   Encounter for routine adult medical exam with abnormal findings 01/05/2011   KIDNEY STONE 03/27/2010   STRESS REACTION, ACUTE, WITH EMOTIONAL DISTURBANCE 09/13/2007   HEADACHE 09/13/2007    Past Surgical History:  Procedure Laterality Date   ABDOMINAL HYSTERECTOMY     CHOLECYSTECTOMY  06/1994   COLONOSCOPY WITH PROPOFOL N/A 07/12/2017   Procedure: COLONOSCOPY WITH PROPOFOL;  Surgeon: Toney Reil, MD;  Location: [facility] ENDOSCOPY;  Service: Gastroenterology;  Laterality: N/A;   CYSTOSCOPY N/A 01/14/2017   Procedure: CYSTOSCOPY;  Surgeon: Vena Austria, MD;  Location: [facility] ORS;  Service: Gynecology;  Laterality: N/A;   ESOPHAGOGASTRODUODENOSCOPY (EGD) WITH PROPOFOL N/A 07/12/2017   Procedure: ESOPHAGOGASTRODUODENOSCOPY (EGD) WITH PROPOFOL;  Surgeon: Toney Reil, MD;  Location: [facility] ENDOSCOPY;  Service: Gastroenterology;  Laterality: N/A;   KIDNEY STONE SURGERY      LITHOTRIPSY  03/2006   TONSILECTOMY, ADENOIDECTOMY, BILATERAL MYRINGOTOMY AND TUBES     myringotomy tubes   TOTAL LAPAROSCOPIC HYSTERECTOMY WITH SALPINGECTOMY Bilateral 01/14/2017   Procedure: HYSTERECTOMY TOTAL LAPAROSCOPIC WITH SALPINGECTOMY;  Surgeon: Vena Austria, MD;  Location: [facility] ORS;  Service: Gynecology;  Laterality: Bilateral;    Family History  Problem Relation Age of Onset   Migraines Mother    Cancer Maternal Grandmother 26       brain tumor/breast cancer   Breast cancer Maternal Grandmother    Heart disease Maternal Grandfather        heart problems   Breast cancer Paternal Aunt 58       Contact   Colon cancer Neg Hx    Esophageal cancer Neg Hx    Liver disease Neg Hx    Pancreatic cancer Neg Hx    Stomach cancer Neg Hx    Inflammatory bowel disease Neg Hx     Social History   Socioeconomic History   Marital status: Married    Spouse name: Not on file   Number of children: 2   Years of education: Not on file   Highest education level: Not on file  Occupational History   Occupation: insurance    Employer: [employer]  Tobacco Use   Smoking status: Never   Smokeless tobacco: Never  Vaping Use   Vaping status: Never Used  Substance and Sexual Activity   Alcohol use: No    Alcohol/week: 0.0 standard drinks of alcohol   Drug use: No   Sexual activity: Yes    Birth control/protection: Surgical    Comment: Hysterectomy  Other Topics Concern   Not on file  Social History Narrative   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, she is an Marine scientist   Rare caffeine and never smoker no alcohol no drug or substance use   Social Determinants of Corporate investment banker Strain: Low Risk  (03/23/2019)   Received from [facility]   Overall Financial Resource Strain (CARDIA)    Difficulty of Paying Living Expenses: Not hard at all  Food Insecurity: Not on file  Transportation Needs: No Transportation Needs (03/23/2019)    [facility] from [facility] - Transportation    Lack of Transportation (Medical): No    Lack of Transportation (Non-Medical): No  Physical Activity: Insufficiently Active (03/02/2017)   Exercise Vital Sign    Days of Exercise per Week: 3 days    Minutes of Exercise per Session: 30 min  Stress: No Stress Concern Present (03/02/2017)   [employer] of Occupational Health - Occupational Stress Questionnaire    Feeling of Stress : Only a little  Social Connections: Moderately Integrated (03/02/2017)   Social Connection and Isolation Panel [NHANES]    Frequency of Communication with Friends and Family: More than three times a week    Frequency of Social Gatherings with Friends and Family: More than three times a week    Attends Religious Services: More than 4 times per year    Active Member of [employer] or Organizations: No    Attends Banker Meetings: Never    Marital Status: Married  Catering manager Violence: Not At Risk (03/02/2017)   Humiliation, Afraid, Rape, and Kick questionnaire    Fear of Current or Ex-Partner: No    Emotionally Abused: No    Physically Abused: No    Sexually Abused: No    Outpatient Medications Prior to Visit  Medication Sig Dispense Refill   acetaminophen (TYLENOL) 500 MG tablet Take 500 mg by mouth every 6 (six) hours as needed.     albuterol (VENTOLIN HFA) 108 (90 Base) MCG/ACT inhaler Inhale 2 puffs into the lungs every 6 (six) hours as needed for wheezing or shortness of breath. 8 g 2   azithromycin (ZITHROMAX) 250 MG tablet Take 1 tablet (250 mg total) by mouth daily. Take first 2 tablets together, then 1 every day until finished. (Patient not taking: Reported on 08/20/2022) 6 tablet 0   BREO ELLIPTA 100-25 MCG/ACT AEPB Inhale 1 puff into the lungs daily. 28 each 11   Cholecalciferol (D3-1000 PO) Take by mouth.     estradiol (ESTRACE VAGINAL) 0.1 MG/GM vaginal cream Insert 1 g vaginally once weekly as maintenance. 42.5 g 1    montelukast (SINGULAIR) 10 MG tablet Take 1 tablet (10 mg total) by mouth daily. (Patient not taking: Reported on 07/09/2022) 90 tablet 3   No facility-administered medications prior to visit.      ROS:  Review of Systems BREAST: No symptoms   OBJECTIVE:   Vitals:  LMP 01/08/2017 (Exact Date)   Physical Exam  Results: No results found for this or any previous visit (from the past 24 hour(s)).   Assessment/Plan: No diagnosis found.    No orders of the defined types were placed in this encounter.     No follow-ups on file.  Jearl Soto B. Elizibeth Breau, PA-C 10/29/2022 5:07 PM

## 2022-11-02 ENCOUNTER — Encounter: Payer: Self-pay | Admitting: Obstetrics and Gynecology

## 2022-11-02 ENCOUNTER — Other Ambulatory Visit (HOSPITAL_COMMUNITY)
Admission: RE | Admit: 2022-11-02 | Discharge: 2022-11-02 | Disposition: A | Discharge status: Home / Self Care | Payer: No Typology Code available for payment source | Source: Ambulatory Visit | Attending: Obstetrics and Gynecology | Admitting: Obstetrics and Gynecology

## 2022-11-02 ENCOUNTER — Ambulatory Visit: Payer: 59 | Admitting: Obstetrics and Gynecology

## 2022-11-02 VITALS — BP 112/70 | Ht 64.0 in | Wt 145.0 lb

## 2022-11-02 DIAGNOSIS — Z1272 Encounter for screening for malignant neoplasm of vagina: Secondary | ICD-10-CM

## 2022-11-02 DIAGNOSIS — N950 Postmenopausal bleeding: Secondary | ICD-10-CM

## 2022-11-02 DIAGNOSIS — N898 Other specified noninflammatory disorders of vagina: Secondary | ICD-10-CM

## 2022-11-05 ENCOUNTER — Ambulatory Visit: Payer: 59

## 2022-11-05 DIAGNOSIS — J4530 Mild persistent asthma, uncomplicated: Secondary | ICD-10-CM | POA: Insufficient documentation

## 2022-11-05 DIAGNOSIS — Z79899 Other long term (current) drug therapy: Secondary | ICD-10-CM | POA: Diagnosis not present

## 2022-11-05 DIAGNOSIS — J45991 Cough variant asthma: Secondary | ICD-10-CM | POA: Diagnosis present

## 2022-11-05 LAB — PULMONARY FUNCTION TEST ARMC ONLY: FEV6-Pre: 2.23 L

## 2022-11-08 ENCOUNTER — Encounter: Payer: Self-pay | Admitting: Obstetrics and Gynecology

## 2022-11-17 ENCOUNTER — Encounter: Payer: Self-pay | Admitting: Pulmonary Disease

## 2022-11-17 ENCOUNTER — Ambulatory Visit: Payer: No Typology Code available for payment source | Admitting: Pulmonary Disease

## 2022-11-17 VITALS — BP 110/64 | HR 56 | Temp 97.8°F | Ht 64.0 in | Wt 143.8 lb

## 2022-11-17 DIAGNOSIS — J019 Acute sinusitis, unspecified: Secondary | ICD-10-CM

## 2022-11-17 DIAGNOSIS — J45991 Cough variant asthma: Secondary | ICD-10-CM | POA: Diagnosis not present

## 2022-11-17 DIAGNOSIS — J301 Allergic rhinitis due to pollen: Secondary | ICD-10-CM | POA: Diagnosis not present

## 2022-11-17 LAB — NITRIC OXIDE: Nitric Oxide: 14

## 2022-11-17 MED ORDER — AZITHROMYCIN 250 MG PO TABS: ORAL_TABLET | ORAL | 0 refills | Status: AC

## 2022-11-17 MED ORDER — MOMETASONE FUROATE 50 MCG/ACT NA SUSP: 1.0000 | Freq: Two times a day (BID) | NASAL | 6 refills | Status: DC

## 2022-11-17 NOTE — Patient Instructions (Signed)
Your level of inflammation in the airway was low today.  This is good and means that the Virgel Bouquet is working.  We sent a prescription for a Z-Pak and a nasal spray to your pharmacy this should help with your sinus issues.  We will see you in follow-up in 3 months time but do call us prior to that time should any new difficulties arise.

## 2022-11-17 NOTE — Progress Notes (Signed)
Subjective:    Patient ID: Courtney Pineda, female    DOB: 1970-08-23, 52 y.o.   MRN: [id number]  Patient Care Team: Judy Pimple, MD as PCP - General  Chief Complaint  Patient presents with   Follow-up    Congestion for 1 week. Cough with clear sputum. Drainage. No wheezing or SOB. "Chest feels tight"    HPI Courtney Pineda is a 52 year old lifelong never smoker who presents for follow-up on the issue of chronic cough.  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  Last evaluated here on 20 Aug 2022 at that time LABA/ICS was reinstituted.  Previously she had been on montelukast however this had to be discontinued due to elevation on liver function, this returned to normal after discontinuation of montelukast.  At her prior visit she was started on Breo Ellipta 100/25 and she notes that this medication helps her.  She uses her albuterol rarely but does admit that she only uses it.  Over the last 7 days she has had increasing nasal congestion with postnasal drip, she notes that her cough has returned with clear sputum production.  No wheezing or shortness of breath but does feel some chest tightness.  This is her usual yearly recurrence of sinusitis symptoms which happens around this time of the year.  Overall she feels that the [facility] and the as needed albuterol to help her very much.  We discussed her PFTs which were performed on 25 July and showed no significant changes from prior.  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 She has not had any fevers, chills or sweats.  She has had no hemoptysis.  She does not endorse any other symptomatology today.   DATA 07/22/2020 PFTs: FEV1 1.96 L or 68% predicted, FVC 2.40 L or 66% predicted, FEV1/FVC 82%, mild decrease on lung volumes, diffusion capacity not studied, minimal obstructive airways disease. 09/20/2020 PFTs: FEV1 1.90 L or 66% predicted, FVC 2.40 L or 66% predicted, FEV1/FVC 79%,  patient had difficulties with the study.  Subsequent methacholine challenge showed airways reactivity consistent with asthma.  There is concomitant restriction as well however patient had difficulty following directions and this may affect lung volumes. 02/14/2021 CT chest high-resolution: Mild band like scarring on the bilateral lung bases, no evidence of interstitial lung disease.  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 11/05/2022 PFTs: FEV1 1.87 L or 66% predicted FVC 2.23 L or 62% predicted, there is bronchodilator response with regards to airways resistance.  Mildly reduced lung volumes, normal diffusion capacity.  Consistent with mild obstructive airways disease, reversible and probable mild restriction.  Review of Systems A 10 point review of systems was performed and it is as noted above otherwise negative.   Patient Active Problem List   Diagnosis Date Noted   Cerumen impaction 06/19/2022   SUI (stress urinary incontinence, female) 07/07/2021   Encounter for screening mammogram for breast cancer 04/30/2021   Asthma 11/12/2020   DOE (dyspnea on exertion) 07/09/2020   Allergic rhinitis 12/25/2019   Vaccine reaction 07/26/2019   Perimenopause 06/08/2018   Low HDL (under 40) 06/08/2018   [place] intestinal bacterial overgrowth 02/09/2018   Cyst of right kidney 06/04/2017   RUQ abdominal pain 05/31/2017   Elevated glucose level 05/22/2017   S/P laparoscopic hysterectomy 01/14/2017   Daily headache 06/01/2016   Chronic cough 06/01/2016   Encounter for routine gynecological examination 05/23/2014   Burning sensation of mouth 09/12/2013  Elevated transaminase level 01/13/2011   Encounter for routine adult medical exam with abnormal findings 01/05/2011   KIDNEY STONE 03/27/2010   STRESS REACTION, ACUTE, WITH EMOTIONAL DISTURBANCE 09/13/2007   HEADACHE 09/13/2007    Social History   Tobacco Use   Smoking status: Never   Smokeless tobacco:  Never  Substance Use Topics   Alcohol use: No    Alcohol/week: 0.0 standard drinks of alcohol    Allergies  Allergen Reactions   Amoxicillin Anaphylaxis   Bee Venom Anaphylaxis   Sulfa Antibiotics Anaphylaxis   Sulfonamide Derivatives Anaphylaxis    Lungs close up   Covid-19 (Mrna) Vaccine     J and J Local reaction    Penicillins Rash    Has patient had a PCN reaction causing immediate rash, facial/tongue/throat swelling, SOB or lightheadedness with hypotension: No Has patient had a PCN reaction causing severe rash involving mucus membranes or skin necrosis: No Has patient had a PCN reaction that required hospitalization: No Has patient had a PCN reaction occurring within the last 10 years: No If all of the above answers are "NO", then may proceed with Cephalosporin use.     Current Meds  Medication Sig   acetaminophen (TYLENOL) 500 MG tablet Take 500 mg by mouth every 6 (six) hours as needed.   albuterol (VENTOLIN HFA) 108 (90 Base) MCG/ACT inhaler Inhale 2 puffs into the lungs every 6 (six) hours as needed for wheezing or shortness of breath.   BREO ELLIPTA 100-25 MCG/ACT AEPB Inhale 1 puff into the lungs daily.   Cholecalciferol (D3-1000 PO) Take by mouth.   estradiol (ESTRACE VAGINAL) 0.1 MG/GM vaginal cream Insert 1 g vaginally once weekly as maintenance.    Immunization History  Administered Date(s) Administered   Influenza Split 01/13/2011, 02/02/2012   Influenza Whole 03/22/2002, 12/27/2009   Influenza,inj,Quad PF,6+ Mos 01/20/2013, 01/26/2014, 01/25/2015, 02/12/2016, 01/27/2017, 01/06/2018, 01/05/2019, 01/17/2020, 01/17/2021, 01/30/2022   Janssen (J&J) SARS-COV-2 Vaccination 07/19/2019   Td 06/15/2002   Tdap 03/14/2013      Objective:     BP 110/64 (BP Location: Left Arm, Cuff Size: Normal)   Pulse (!) 56   Temp 97.8 F (36.6 C)   Ht 5\' 4"  (1.626 m)   Wt 143 lb 12.8 oz (65.2 kg)   LMP 01/08/2017 (Exact Date)   SpO2 97%   BMI 24.68 kg/m   SpO2: 97  % O2 Device: None (Room air)  GENERAL: Well-developed, well-nourished woman, no acute distress fully ambulatory.  No conversational dyspnea. Intermittent dry cough. Nasal quality to the speech. HEAD: Normocephalic, atraumatic.  EYES: Pupils equal, round, reactive to light.  No scleral icterus.  Conjunctiva clear. MOUTH: Dentition intact, oral mucosa moist. NECK: Supple. No thyromegaly. Trachea midline. No JVD.  No adenopathy. PULMONARY: Slightly diminished bilaterally.  No adventitious sounds. CARDIOVASCULAR: S1 and S2. Regular rate and rhythm.  No rubs, murmurs or gallops heard. ABDOMEN: Benign. MUSCULOSKELETAL: No joint deformity, no clubbing, no edema.  NEUROLOGIC: No focal deficit, no gait disturbance, speech is fluent. SKIN: Intact,warm,dry. PSYCH: Mood and behavior normal.     Lab Results  Component Value Date   NITRICOXIDE 14 11/17/2022     Assessment & Plan:     ICD-10-CM   1. Cough variant asthma  J45.991 Nitric oxide   Continue Breo 100/25, 1 inhalation daily Continue as needed albuterol    2. Acute rhinosinusitis  J01.90    Azithromycin treatment pack Nasal hygiene    3. Seasonal allergic rhinitis due to pollen  J30.1  Nasonex 1 inhalation to each nostril twice a day     Meds ordered this encounter  Medications   azithromycin (ZITHROMAX) 250 MG tablet    Sig: Take 2 tablets (500 mg) on  Day 1,  followed by 1 tablet (250 mg) once daily on Days 2 through 5.    Dispense:  6 each    Refill:  0   mometasone (NASONEX) 50 MCG/ACT nasal spray    Sig: Place 1 spray into the nose 2 (two) times daily.    Dispense:  17 g    Refill:  6   Will see the patient in follow-up in 3 months time she is to contact us prior to that time should any new difficulties arise.   Gailen Shelter, MD Advanced Bronchoscopy [facility]    *This note was dictated using voice recognition software/Dragon.  Despite best efforts to proofread, errors can occur  which can change the meaning. Any transcriptional errors that result from this process are unintentional and may not be fully corrected at the time of dictation.

## 2022-11-19 ENCOUNTER — Encounter: Payer: Self-pay | Admitting: Obstetrics and Gynecology

## 2022-11-19 ENCOUNTER — Ambulatory Visit: Payer: No Typology Code available for payment source | Admitting: Obstetrics and Gynecology

## 2022-11-19 VITALS — BP 104/71 | HR 58 | Resp 16 | Ht 64.0 in | Wt 145.0 lb

## 2022-11-19 DIAGNOSIS — N941 Unspecified dyspareunia: Secondary | ICD-10-CM | POA: Diagnosis not present

## 2022-11-19 DIAGNOSIS — N9089 Other specified noninflammatory disorders of vulva and perineum: Secondary | ICD-10-CM

## 2022-11-19 DIAGNOSIS — N909 Noninflammatory disorder of vulva and perineum, unspecified: Secondary | ICD-10-CM

## 2022-11-19 NOTE — Progress Notes (Signed)
GYNECOLOGY PROGRESS NOTE  Subjective:    Patient ID: Courtney Pineda, female    DOB: Sep 07, 1970, 52 y.o.   MRN: [id number]  HPI  Patient is a 52 y.o. G62P2002 female who presents for consultation for removal of vaginal cyst. She is a patient of Alicia Copland, PA-C. She has a vaginal cyst located at post fouchette causing dyspareunia.   The following portions of the patient's history were reviewed and updated as appropriate:   She  has a past medical history of Anemia, Asthma, Chronic right upper quadrant pain, Dysplastic nevus (09/16/2015), Dysplastic nevus (10/27/2017), History of kidney stones, Hyperemesis gravidarum (1999), and [place] intestinal bacterial overgrowth (02/09/2018).  She  has a past surgical history that includes Cholecystectomy (06/1994); Tonsilectomy, adenoidectomy, bilateral myringotomy and tubes; Lithotripsy (03/2006); Kidney stone surgery; Total laparoscopic hysterectomy with salpingectomy (Bilateral, 01/14/2017); Cystoscopy (N/A, 01/14/2017); Colonoscopy with propofol (N/A, 07/12/2017); Esophagogastroduodenoscopy (egd) with propofol (N/A, 07/12/2017); and Abdominal hysterectomy.  She  reports that she has never smoked. She has never used smokeless tobacco. She reports that she does not drink alcohol and does not use drugs.  Current Outpatient Medications on File Prior to Visit  Medication Sig Dispense Refill   acetaminophen (TYLENOL) 500 MG tablet Take 500 mg by mouth every 6 (six) hours as needed.     albuterol (VENTOLIN HFA) 108 (90 Base) MCG/ACT inhaler Inhale 2 puffs into the lungs every 6 (six) hours as needed for wheezing or shortness of breath. 8 g 2   azithromycin (ZITHROMAX) 250 MG tablet Take 2 tablets (500 mg) on  Day 1,  followed by 1 tablet (250 mg) once daily on Days 2 through 5. 6 each 0   BREO ELLIPTA 100-25 MCG/ACT AEPB Inhale 1 puff into the lungs daily. 28 each 11   Cholecalciferol (D3-1000 PO) Take by mouth.     estradiol (ESTRACE VAGINAL) 0.1  MG/GM vaginal cream Insert 1 g vaginally once weekly as maintenance. 42.5 g 1   mometasone (NASONEX) 50 MCG/ACT nasal spray Place 1 spray into the nose 2 (two) times daily. 17 g 6   No current facility-administered medications on file prior to visit.   She is allergic to amoxicillin, bee venom, sulfa antibiotics, sulfonamide derivatives, covid-19 (mrna) vaccine, and penicillins..  Review of Systems Pertinent items are noted in HPI.   Objective:   Blood pressure 104/71, pulse (!) 58, resp. rate 16, height 5\' 4"  (1.626 m), weight 145 lb (65.8 kg), last menstrual period 01/08/2017. Body mass index is 24.89 kg/m. General appearance: alert, cooperative, and no distress Abdomen: soft, non-tender; bowel sounds normal; no masses,  no organomegaly Pelvic: external genitalia with Mosqueda vesicular cyst noted at posterior forchette, ~ 2-3 mm in size.  Digital exam noting tight perineal body with pain to palpation. No erythema, skin changes present.   Vagina without discharge.  Cervix normal appearing, no lesions and no motion tenderness.  Uterus mobile, nontender, normal shape and size.  Adnexae non-palpable, nontender bilaterally.  Extremities: extremities normal, atraumatic, no cyanosis or edema Neurologic: Grossly normal   Assessment:   1. 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 Dyspareunia in female     Plan:   1. 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   2. 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  Likely secondary to history of childbirth trauma. Offered referral to  pelvic floor physical therapy.  - Ambulatory referral to Physical Therapy  3. Dyspareunia in female - See above #2.   - Ambulatory referral to Physical Therapy - Can utilize local analgesia with lidocaine gel prior to intercourse to assess if this will help with painful  intercourse until seen by PT.     Diagnosis: Perineal Cyst - Location: Posterior Fourchette Procedure: Incision & drainage Informed consent:  Discussed risks (infection, pain, bleeding, bruising, numbness, and recurrence of the condition) and benefits of the procedure, as well as the alternatives.  Informed consent was obtained. Anesthesia: 2 ml of 1% lidocaine The area was prepped with Betadine swab x 3. The area was injected with 2 ml of 1% lidocaine.  The lesion was incised using a scalpel and a Winship amount of straw-colored fluid was expressed.  Silver nitrate was used to treat the Spikes amount of oozing from the incision site.  The patient tolerated the procedure well. The patient was instructed on post-op  care.   Hildred Laser, MD [facility]

## 2022-11-30 ENCOUNTER — Ambulatory Visit: Payer: BC Managed Care – PPO | Admitting: Dermatology

## 2023-01-18 ENCOUNTER — Telehealth: Payer: Self-pay | Admitting: Family Medicine

## 2023-01-18 NOTE — Telephone Encounter (Signed)
Patient would like to know if she can be placed on the nurse schedule for this Thursday 02/21/2023 at 4pm to get her flu shot?

## 2023-01-19 NOTE — Telephone Encounter (Signed)
Patient called in to follow up on this request.

## 2023-01-19 NOTE — Telephone Encounter (Signed)
Called patient added to nurse schedule for this week patient informed that she will need to be here at or before 4

## 2023-01-21 ENCOUNTER — Ambulatory Visit (INDEPENDENT_AMBULATORY_CARE_PROVIDER_SITE_OTHER): Payer: No Typology Code available for payment source

## 2023-01-21 DIAGNOSIS — Z23 Encounter for immunization: Secondary | ICD-10-CM | POA: Diagnosis not present

## 2023-01-22 ENCOUNTER — Encounter: Payer: Self-pay | Admitting: Obstetrics and Gynecology

## 2023-02-04 ENCOUNTER — Ambulatory Visit: Payer: No Typology Code available for payment source | Admitting: Physical Therapy

## 2023-02-11 ENCOUNTER — Ambulatory Visit: Payer: No Typology Code available for payment source | Admitting: Physical Therapy

## 2023-02-18 ENCOUNTER — Ambulatory Visit
Payer: No Typology Code available for payment source | Attending: Obstetrics and Gynecology | Admitting: Physical Therapy

## 2023-02-18 ENCOUNTER — Encounter: Payer: Self-pay | Admitting: Physical Therapy

## 2023-02-18 DIAGNOSIS — R278 Other lack of coordination: Secondary | ICD-10-CM | POA: Diagnosis present

## 2023-02-18 DIAGNOSIS — N909 Noninflammatory disorder of vulva and perineum, unspecified: Secondary | ICD-10-CM | POA: Insufficient documentation

## 2023-02-18 DIAGNOSIS — N941 Unspecified dyspareunia: Secondary | ICD-10-CM | POA: Insufficient documentation

## 2023-02-18 DIAGNOSIS — G8929 Other chronic pain: Secondary | ICD-10-CM | POA: Diagnosis present

## 2023-02-18 DIAGNOSIS — M25561 Pain in right knee: Secondary | ICD-10-CM | POA: Diagnosis present

## 2023-02-18 DIAGNOSIS — M533 Sacrococcygeal disorders, not elsewhere classified: Secondary | ICD-10-CM

## 2023-02-18 DIAGNOSIS — M25562 Pain in left knee: Secondary | ICD-10-CM | POA: Insufficient documentation

## 2023-02-18 DIAGNOSIS — M25512 Pain in left shoulder: Secondary | ICD-10-CM | POA: Diagnosis present

## 2023-02-18 DIAGNOSIS — R2689 Other abnormalities of gait and mobility: Secondary | ICD-10-CM | POA: Diagnosis present

## 2023-02-18 NOTE — Therapy (Signed)
OUTPATIENT PHYSICAL THERAPY EVALUATION   Patient Name: Courtney Pineda MRN: [id number] DOB:16-Nov-1970, 52 y.o., female Today's Date: 02/18/2023   PT End of Session - 02/18/23 1552     Visit Number 1    Number of Visits 10    Date for PT Re-Evaluation 04/29/23    PT Start Time 1553    PT Stop Time 1635    PT Time Calculation (min) 42 min    Activity Tolerance Patient tolerated treatment well;No increased pain    Behavior During Therapy [facility] for tasks assessed/performed             Past Medical History:  Diagnosis Date   Anemia    Asthma    Chronic right upper quadrant pain    Dysplastic nevus 09/16/2015   left infra axillary   Dysplastic nevus 10/27/2017   left side   History of kidney stones    Hyperemesis gravidarum 1999   [place] intestinal bacterial overgrowth 02/09/2018   Past Surgical History:  Procedure Laterality Date   ABDOMINAL HYSTERECTOMY     CHOLECYSTECTOMY  06/1994   COLONOSCOPY WITH PROPOFOL N/A 07/12/2017   Procedure: COLONOSCOPY WITH PROPOFOL;  Surgeon: Toney Reil, MD;  Location: [facility] ENDOSCOPY;  Service: Gastroenterology;  Laterality: N/A;   CYSTOSCOPY N/A 01/14/2017   Procedure: CYSTOSCOPY;  Surgeon: Vena Austria, MD;  Location: [facility] ORS;  Service: Gynecology;  Laterality: N/A;   ESOPHAGOGASTRODUODENOSCOPY (EGD) WITH PROPOFOL N/A 07/12/2017   Procedure: ESOPHAGOGASTRODUODENOSCOPY (EGD) WITH PROPOFOL;  Surgeon: Toney Reil, MD;  Location: [facility] ENDOSCOPY;  Service: Gastroenterology;  Laterality: N/A;   KIDNEY STONE SURGERY     LITHOTRIPSY  03/2006   TONSILECTOMY, ADENOIDECTOMY, BILATERAL MYRINGOTOMY AND TUBES     myringotomy tubes   TOTAL LAPAROSCOPIC HYSTERECTOMY WITH SALPINGECTOMY Bilateral 01/14/2017   Procedure: HYSTERECTOMY TOTAL LAPAROSCOPIC WITH SALPINGECTOMY;  Surgeon: Vena Austria, MD;  Location: [facility] ORS;  Service: Gynecology;  Laterality: Bilateral;   Patient Active Problem List   Diagnosis Date Noted    Cerumen impaction 06/19/2022   SUI (stress urinary incontinence, female) 07/07/2021   Encounter for screening mammogram for breast cancer 04/30/2021   Asthma 11/12/2020   DOE (dyspnea on exertion) 07/09/2020   Allergic rhinitis 12/25/2019   Vaccine reaction 07/26/2019   Perimenopause 06/08/2018   Low HDL (under 40) 06/08/2018   [place] intestinal bacterial overgrowth 02/09/2018   Cyst of right kidney 06/04/2017   RUQ abdominal pain 05/31/2017   Elevated glucose level 05/22/2017   S/P laparoscopic hysterectomy 01/14/2017   Daily headache 06/01/2016   Chronic cough 06/01/2016   Encounter for routine gynecological examination 05/23/2014   Burning sensation of mouth 09/12/2013   Elevated transaminase level 01/13/2011   Encounter for routine adult medical exam with abnormal findings 01/05/2011   KIDNEY STONE 03/27/2010   STRESS REACTION, ACUTE, WITH EMOTIONAL DISTURBANCE 09/13/2007   Headache 09/13/2007    PCP: Milinda Antis   REFERRING PROVIDER: Valentino Saxon   REFERRING DIAG: N90.9 (ICD-10-CM) - 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  Pt had gym routine with squats and weights, kettle bell starting 2022.Pt has not been to the gym since June. Pt picks up her  27 month old granddtr 18 lb.  Pt is trying to set up treadmill and weights at home.  Pain level 10/10   2) B knee pain,   R knee pain started from a ski accident in  1997-1998 , L knee pain started 15- years ago from a position change .  Both knee improve with PT but now it hurts 5/10 in the inside of her knees. Pt had to modify her gym exercises in the classes due to the knees issues. Pt was not able to do her squats as deep.   3) L shoulder pain when she was lifting weights.   4) urinary leakage with coughing,laughing , sneezing and has to wear a panty liner.  Pt is not able to make it to the bathroom on time without leakage 20% of the time.    PERTINENT HISTORY:  Hysterectomy Sedentary desk with three monitors     PAIN:  Are you having pain? Yes: see above   PRECAUTIONS: None  WEIGHT BEARING RESTRICTIONS: No  FALLS:  Has patient fallen in last 6 months? No  LIVING ENVIRONMENT: Lives with: lives with their family Lives in: house  Stairs: 12-13 stairs from garage, stairs to upstairs    OCCUPATION:  sedentary deskjob   PLOF: Independent  PATIENT GOALS:   Rid of the pain , improve posture    OBJECTIVE:    [facility] PT Assessment - 02/19/23 0001       AROM   Overall AROM Comments L Side flexion less than R due to higher L hip      Strength   Overall Strength Comments L LE 4/5 hip flexion, knee fleixon, ext, RLE 5/5      Palpation   SI assessment  L shoulder and L iliac crest higher      Ambulation/Gait   Gait Comments plan to assess             [facility] Adult PT Treatment/Exercise - 02/19/23 0001       Therapeutic Activites    Therapeutic Activities Other Therapeutic Activities    Other Therapeutic Activities explained dep core system approach , posture and alignment, body mechanics to address pelvic and fitness goals.      Neuro Re-ed    Neuro Re-ed Details  cued for body mechanics               HOME EXERCISE PROGRAM: See pt instruction section    ASSESSMENT:  CLINICAL IMPRESSION:     Pt is a  52   yo  who presents with pelvic pain with sexual intercourse and use of speculum at GYN appts , L shoulder  pain, and B knee pain    which impact QOL, ADL, fitness, and community activities.   Pt's musculoskeletal assessment revealed uneven pelvic girdle and shoulder height, asymmetries to gait pattern, limited spinal /pelvic mobility, dyscoordination and strength of pelvic floor mm, hip weakness, poor body mechanics which places strain on the abdominal/pelvic floor mm. These are deficits that indicate an ineffective intraabdominal pressure system associated with increased risk for pt's Sx.   Pt will benefit from coordination training and education on fitness and functional positions in order to gain a more effective intraabdominal pressure  system to minimize Sx.  Pt was provided education on etiology of Sx with anatomy, physiology explanation with images along with the benefits of customized pelvic PT Tx based on pt's medical conditions and musculoskeletal deficits.  Explained the physiology of deep core mm coordination and roles of pelvic floor function in urination, defecation, sexual function, and postural control with deep core mm system.   Regional interdependent approaches will yield greater benefits.  Following Tx today which pt tolerated without complaints,  pt demo'd proper body mechanics to minimize straining pelvic floor.  Plan to address realignment of spine/ pelvis at next session to help promote optimize IAP system for improved pelvic floor function, trunk stability, gait, balance, stabilization with mobility tasks.  Plan to address pelvic floor issues once pelvis and spine are realigned to yield better outcomes.  Plan to address workstation set up at next session.   Pt benefits from skilled PT.    OBJECTIVE IMPAIRMENTS decreased activity tolerance, decreased coordination, decreased endurance, decreased mobility, difficulty walking, decreased ROM, decreased strength, decreased safety awareness, hypomobility, increased muscle spasms, impaired flexibility, improper body mechanics, postural  dysfunction, and pain. scar restrictions   ACTIVITY LIMITATIONS  self-care,  sleep, home chores, work tasks    PARTICIPATION LIMITATIONS:  community, gym activities    PERSONAL FACTORS        are also affecting patient's functional outcome.    REHAB POTENTIAL: Good   CLINICAL DECISION MAKING: Evolving/moderate complexity   EVALUATION COMPLEXITY: Moderate    PATIENT EDUCATION:    Education details: Showed pt anatomy images. Explained muscles attachments/ connection, physiology of deep core system/ spinal- thoracic-pelvis-lower kinetic chain as they relate to pt's presentation, Sx, and past Hx. Explained what and how these areas of deficits need to be restored to balance and function    See Therapeutic activity / neuromuscular re-education section  Answered pt's questions.   Person educated: Patient Education method: Explanation, Demonstration, Tactile cues, Verbal cues, and Handouts Education comprehension: verbalized understanding, returned demonstration, verbal cues required, tactile cues required, and needs further education     PLAN: PT FREQUENCY: 1x/week   PT DURATION: 10 weeks   PLANNED INTERVENTIONS: Therapeutic exercises, Therapeutic activity, Neuromuscular re-education, Balance training, Gait training, Patient/Family education, Self Care, Joint mobilization, Spinal mobilization, Moist heat, Taping, and Manual therapy, dry needling.   PLAN FOR NEXT SESSION: See clinical impression for plan     GOALS: Goals reviewed with patient? Yes  SHORT TERM GOALS: Target date: 03/18/2023    Pt will demo IND with HEP                    Baseline: Not IND            Goal status: INITIAL   LONG TERM GOALS: Target date: 04/29/2023    1.Pt will demo proper deep core coordination without chest breathing and optimal excursion of diaphragm/pelvic floor in order to promote spinal stability and pelvic floor function  Baseline: dyscoordination Goal status: INITIAL  2.  Pt will  demo > 5 pt change on FOTO  to improve QOL and function  PFDI Urinary baseline -  Lower score = better function  Urinary Problem baseline-   Higher score = better function  Pelvic Pain baseline - Lower score = better function  Bowel  constipation baseline -   Higher score = better function  PFDI Bowel - Higher score = better function  Lumber baseline  -  Higher score = better function   Goal status: INITIAL  3.  Pt will demo proper body mechanics in against gravity tasks and ADLs  work station, fitness  to minimize straining pelvic floor / back    Baseline: not IND, improper form that places strain on pelvic floor  Goal status: INITIAL    4. Pt will demo increased gait speed > 1.3 m/s with reciprocal gait pattern, longer stride length  in order to ambulate safely in community and return to fitness routine  Baseline: plan to assess  Goal status: INITIAL    5. Pt will report no need for panty liner across 1 week and able to make it to the bathroom 100% of the time without leakage   Baseline: urinary leakage with coughing,laughing , sneezing and has to wear a panty liner.  Pt is not able to make it to the bathroom on time without leakage 20% of the time.  Goal status: INITIAL   6. Pt will report no pain with B knee with deep squat and demo proper alignment and no L shoulder pain with overhead lifting 10 lb weights with proper engagement of cervicoscapular and thoracolumbar systems in order to minimize injuries with gym activities  Baseline: B knee pain with squats and L shoulder pain with dumbbell 15 lb overhead  Goal status: INITIAL     Mariane Masters, PT 02/18/2023, 4:08 PM

## 2023-02-18 NOTE — Patient Instructions (Addendum)
Sleep with pillow between knees when sleeping on side    Proper body mechanics with getting out of a chair to decrease strain  on back &pelvic floor   Avoid holding your breath when Getting out of the chair:  Scoot to front part of chair chair Heels behind knees, feet are hip width apart, nose over toes  Inhale like you are smelling roses Exhale to stand    __  Sitting with feet on the gournd 4 points of contact   __  Bring in [place] of work station   __

## 2023-02-25 ENCOUNTER — Ambulatory Visit: Payer: No Typology Code available for payment source | Admitting: Physical Therapy

## 2023-03-04 ENCOUNTER — Ambulatory Visit: Payer: No Typology Code available for payment source | Admitting: Physical Therapy

## 2023-03-17 ENCOUNTER — Ambulatory Visit: Payer: No Typology Code available for payment source | Admitting: Pulmonary Disease

## 2023-03-17 ENCOUNTER — Encounter: Payer: Self-pay | Admitting: Pulmonary Disease

## 2023-03-17 VITALS — BP 110/68 | HR 60 | Temp 97.1°F | Ht 64.0 in | Wt 142.2 lb

## 2023-03-17 DIAGNOSIS — J45991 Cough variant asthma: Secondary | ICD-10-CM | POA: Diagnosis not present

## 2023-03-17 DIAGNOSIS — J329 Chronic sinusitis, unspecified: Secondary | ICD-10-CM | POA: Diagnosis not present

## 2023-03-17 LAB — NITRIC OXIDE: Nitric Oxide: 7

## 2023-03-17 MED ORDER — TRELEGY ELLIPTA 100-62.5-25 MCG/ACT IN AEPB: 1.0000 | INHALATION_SPRAY | Freq: Every day | RESPIRATORY_TRACT | 0 refills | Status: DC

## 2023-03-17 NOTE — Patient Instructions (Signed)
VISIT SUMMARY:  During today's visit, we discussed your ongoing issues with cough variant asthma and the recent onset of dizziness after using Breo. We also reviewed your general health maintenance, including your exercise routine and flu vaccination.  YOUR PLAN:  -COUGH VARIANT ASTHMA: Cough variant asthma is a type of asthma where the main symptom is a dry, non-productive cough. Your current treatment with Virgel Bouquet is partially effective, but you have experienced some dizziness. We are stepping up your treatment to Trelegy, which includes an additional component to better manage your symptoms. Please use the provided samples and coupon for Trelegy, and remember to rinse your mouth after each use.  -GENERAL HEALTH MAINTENANCE: You received your flu shot in October, which is great for preventing influenza. Since you work from home and spend a lot of time in front of a computer, it's important to incorporate regular exercise into your routine to improve your overall health and reduce shortness of breath.  INSTRUCTIONS:  Please follow up in three months to review your progress with the new inhaler, Trelegy.

## 2023-03-17 NOTE — Progress Notes (Signed)
Subjective:    Patient ID: Courtney Pineda, female    DOB: 02/26/1971, 52 y.o.   MRN: [id number]  Patient Care Team: Judy Pimple, MD as PCP - General  Chief Complaint  Patient presents with   Follow-up    Occasional chest tightness and DOE. Wheezing. Occasional cough with clear sputum.    BACKGROUND/INTERVAL:Courtney Pineda is a 52 year old lifelong never smoker who presents for follow-up on the issue of chronic cough.  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  Last evaluated here on August 2024 at that time treated for an acute rhinosinusitis.  Since that visit.  HPI Discussed the use of AI scribe software for clinical note transcription with the patient, who gave verbal consent to proceed.  History of Present Illness   The patient, with a history of cough variant asthma, presents with an intermittent cough that lacks a discernible pattern. She denies any associated reflux. She also reports a sensation of chest fatigue, unrelated to any specific activity. The patient has been using Breo and an Adderall inhaler, which she feels helps to some extent. However, she has noticed a new onset of dizziness or wooziness after using Breo in the past few days.  Despite these symptoms, the patient perceives an overall improvement in her cough, noting a decrease in frequency. She works from home in [employer], spending most of her day in front of a computer. She has recently resumed regular gym exercise after a hiatus since June, which has led to increased breathlessness.  The patient received her flu shot in October and has no new medications.   Does not endorse any fevers, chills or sweats.  Does not use rescue inhaler hardly at all, uses once to twice a month.  Not observed coughing during the visit.     DATA 07/22/2020 PFTs: FEV1 1.96 L or 68% predicted, FVC 2.40 L or 66% predicted, FEV1/FVC 82%, mild decrease on lung volumes, diffusion capacity not studied, minimal  obstructive airways disease. 09/20/2020 PFTs: FEV1 1.90 L or 66% predicted, FVC 2.40 L or 66% predicted, FEV1/FVC 79%, patient had difficulties with the study.  Subsequent methacholine challenge showed airways reactivity consistent with asthma.  There is concomitant restriction as well however patient had difficulty following directions and this may affect lung volumes. 02/14/2021 CT chest high-resolution: Mild band like scarring on the bilateral lung bases, no evidence of interstitial lung disease.  Air trapping on expiratory phase imaging suggestive of L[place]kins airways disease. 11/05/2022 PFTs: FEV1 1.87 L or 66% predicted FVC 2.23 L or 62% predicted, there is bronchodilator response with regards to airways resistance.  Mildly reduced lung volumes, normal diffusion capacity.  Consistent with mild obstructive airways disease, reversible and probable mild restriction.  Review of Systems A 10 point review of systems was performed and it is as noted above otherwise negative.   Patient Active Problem List   Diagnosis Date Noted   Cerumen impaction 06/19/2022   SUI (stress urinary incontinence, female) 07/07/2021   Encounter for screening mammogram for breast cancer 04/30/2021   Asthma 11/12/2020   DOE (dyspnea on exertion) 07/09/2020   Allergic rhinitis 12/25/2019   Vaccine reaction 07/26/2019   Perimenopause 06/08/2018   Low HDL (under 40) 06/08/2018   [place] intestinal bacterial overgrowth 02/09/2018   Cyst of right kidney 06/04/2017   RUQ abdominal pain 05/31/2017   Elevated glucose level 05/22/2017   S/P laparoscopic hysterectomy 01/14/2017   Daily headache 06/01/2016   Chronic cough 06/01/2016   Encounter for  routine gynecological examination 05/23/2014   Burning sensation of mouth 09/12/2013   Elevated transaminase level 01/13/2011   Encounter for routine adult medical exam with abnormal findings 01/05/2011   KIDNEY STONE 03/27/2010   STRESS REACTION, ACUTE, WITH EMOTIONAL DISTURBANCE  09/13/2007   Headache 09/13/2007    Social History   Tobacco Use   Smoking status: Never   Smokeless tobacco: Never  Substance Use Topics   Alcohol use: No    Alcohol/week: 0.0 standard drinks of alcohol    Allergies  Allergen Reactions   Amoxicillin Anaphylaxis   Bee Venom Anaphylaxis   Sulfa Antibiotics Anaphylaxis   Sulfonamide Derivatives Anaphylaxis    Lungs close up   Covid-19 (Mrna) Vaccine     J and J Local reaction    Penicillins Rash    Has patient had a PCN reaction causing immediate rash, facial/tongue/throat swelling, SOB or lightheadedness with hypotension: No Has patient had a PCN reaction causing severe rash involving mucus membranes or skin necrosis: No Has patient had a PCN reaction that required hospitalization: No Has patient had a PCN reaction occurring within the last 10 years: No If all of the above answers are "NO", then may proceed with Cephalosporin use.     Current Meds  Medication Sig   albuterol (VENTOLIN HFA) 108 (90 Base) MCG/ACT inhaler Inhale 2 puffs into the lungs every 6 (six) hours as needed for wheezing or shortness of breath.   BREO ELLIPTA 100-25 MCG/ACT AEPB Inhale 1 puff into the lungs daily.   Cholecalciferol (D3-1000 PO) Take by mouth.    Immunization History  Administered Date(s) Administered   Influenza Split 01/13/2011, 02/02/2012   Influenza Whole 03/22/2002, 12/27/2009   Influenza, Seasonal, Injecte, Preservative Fre 01/21/2023   Influenza,inj,Quad PF,6+ Mos 01/20/2013, 01/26/2014, 01/25/2015, 02/12/2016, 01/27/2017, 01/06/2018, 01/05/2019, 01/17/2020, 01/17/2021, 01/30/2022   Janssen (J&J) SARS-COV-2 Vaccination 07/19/2019   Td 06/15/2002   Tdap 03/14/2013        Objective:     BP 110/68 (BP Location: Right Arm, Cuff Size: Normal)   Pulse 60   Temp (!) 97.1 F (36.2 C)   Ht 5\' 4"  (1.626 m)   Wt 142 lb 3.2 oz (64.5 kg)   LMP 01/08/2017 (Exact Date)   SpO2 96%   BMI 24.41 kg/m   SpO2: 96 % O2 Device:  None (Room air)  GENERAL: Well-developed, well-nourished woman, no acute distress fully ambulatory.  No conversational dyspnea. Intermittent dry cough. Nasal quality to the speech. HEAD: Normocephalic, atraumatic.  EYES: Pupils equal, round, reactive to light.  No scleral icterus.  Conjunctiva clear. MOUTH: Dentition intact, oral mucosa moist. NECK: Supple. No thyromegaly. Trachea midline. No JVD.  No adenopathy. PULMONARY: Slightly diminished bilaterally.  No adventitious sounds. CARDIOVASCULAR: S1 and S2. Regular rate and rhythm.  No rubs, murmurs or gallops heard. ABDOMEN: Benign. MUSCULOSKELETAL: No joint deformity, no clubbing, no edema.  NEUROLOGIC: No focal deficit, no gait disturbance, speech is fluent. SKIN: Intact,warm,dry. PSYCH: Mood and behavior normal.    Lab Results  Component Value Date   NITRICOXIDE 7 03/17/2023  *14>>7   Assessment & Plan:     ICD-10-CM   1. Cough variant asthma  J45.991 Nitric oxide    2. Chronic rhinosinusitis  J32.9      Orders Placed This Encounter  Procedures   Nitric oxide   Meds ordered this encounter  Medications   Fluticasone-Umeclidin-Vilant (TRELEGY ELLIPTA) 100-62.5-25 MCG/ACT AEPB    Sig: Inhale 1 puff into the lungs daily.    Dispense:  14 each    Refill:  0   Assessment and Plan    Cough Variant Asthma   Intermittent cough with no clear trigger. Reports occasional dizziness after using Breo. Inflammation in the airway is low. Current treatment with Virgel Bouquet is partially effective but not fully addressing symptoms due to the specific type of inflammation involved in cough variant asthma. Discussed stepping up treatment to Trelegy, which includes an additional component to better manage symptoms. Explained that Trelegy is similar to [facility] but may provide better control for her condition. Provided samples and a coupon for Trelegy. Instructed to rinse mouth after use.   - Provide samples of Trelegy inhaler   - Instruct to rinse  mouth after use   - Provide a coupon for Trelegy   - Follow up in three months    General Health Maintenance   Received flu shot in October. Works from home in front of a computer all day. Has not been exercising regularly since June and experiences shortness of breath when attempting to exercise.   - Encourage regular exercise to improve overall health and reduce shortness of breath.      Gailen Shelter, MD [facility]    *This note was generated using voice recognition software/Dragon and/or AI transcription program.  Despite best efforts to proofread, errors can occur which can change the meaning. Any transcriptional errors that result from this process are unintentional and may not be fully corrected at the time of dictation.

## 2023-03-18 ENCOUNTER — Ambulatory Visit: Payer: No Typology Code available for payment source | Admitting: Physical Therapy

## 2023-03-25 ENCOUNTER — Ambulatory Visit: Payer: No Typology Code available for payment source | Admitting: Physical Therapy

## 2023-04-01 ENCOUNTER — Ambulatory Visit: Payer: No Typology Code available for payment source | Admitting: Physical Therapy

## 2023-04-15 ENCOUNTER — Ambulatory Visit: Payer: No Typology Code available for payment source | Admitting: Physical Therapy

## 2023-04-22 ENCOUNTER — Telehealth: Payer: Self-pay | Admitting: Pulmonary Disease

## 2023-04-22 MED ORDER — TRELEGY ELLIPTA 100-62.5-25 MCG/ACT IN AEPB: 1.0000 | INHALATION_SPRAY | Freq: Every day | RESPIRATORY_TRACT | 11 refills | Status: AC

## 2023-04-22 NOTE — Telephone Encounter (Signed)
 Pt calling to get switch from the Breo to the Trelegy based off the sample provided.  Pharmacy [facility] on [address]

## 2023-04-22 NOTE — Telephone Encounter (Signed)
 Patient advised that medication has been sent to [facility]. Nothing further needed.

## 2023-05-18 ENCOUNTER — Ambulatory Visit
Admission: EM | Admit: 2023-05-18 | Discharge: 2023-05-18 | Disposition: A | Discharge status: Home / Self Care | Payer: No Typology Code available for payment source | Attending: Emergency Medicine | Admitting: Emergency Medicine

## 2023-05-18 ENCOUNTER — Ambulatory Visit: Payer: Self-pay | Admitting: Family Medicine

## 2023-05-18 ENCOUNTER — Encounter: Payer: Self-pay | Admitting: Emergency Medicine

## 2023-05-18 DIAGNOSIS — J101 Influenza due to other identified influenza virus with other respiratory manifestations: Secondary | ICD-10-CM

## 2023-05-18 DIAGNOSIS — R112 Nausea with vomiting, unspecified: Secondary | ICD-10-CM | POA: Diagnosis not present

## 2023-05-18 LAB — POCT INFLUENZA A/B
Influenza A, POC: POSITIVE — AB
Influenza B, POC: NEGATIVE

## 2023-05-18 MED ORDER — OSELTAMIVIR PHOSPHATE 75 MG PO CAPS: 75.0000 mg | ORAL_CAPSULE | Freq: Two times a day (BID) | ORAL | 0 refills | Status: DC

## 2023-05-18 MED ORDER — ONDANSETRON 4 MG PO TBDP: 4.0000 mg | ORAL_TABLET | Freq: Three times a day (TID) | ORAL | 0 refills | Status: DC | PRN

## 2023-05-18 NOTE — Telephone Encounter (Signed)
 Copied from CRM [id number]. Topic: Clinical - Red Word Triage >> May 18, 2023 12:32 PM Carmell R wrote: Red Word that prompted transfer to Nurse Triage: Fever was 100.9 and went down to 99.7. Coughing and very scratchy voice, almost hoarse. Headache and body aches, chills. 9/10 on pain scale   Chief Complaint: Cough Symptoms: Cough, hoarse voice, scratchy throat, fever, body aches, headache, chest pain with cough Frequency: Constant  Pertinent Negatives: Patient denies shortness of breath  Disposition: [] ED /[x] Urgent Care (no appt availability in office) / [] Appointment(In office/virtual)/ []  [facility]/ [] Home Care/ [] Refused Recommended Disposition /[] [facility]/ []  Follow-up with PCP Additional Notes: Patient reports that yesterday she began to experience a cough and scratchy voice. She states that she recently returned home from [place] and yesterday began to have a cough, hoarse voice, scratchy throat, headache, body aches, and chest pain that is only present with her cough. She states she has also had a fever that she is controlling with Tylenol . No appointment's available today. Patient reports she will go be evaluated at urgent care.      Reason for Disposition  [1] Continuous (nonstop) coughing interferes with work or school AND [2] no improvement using cough treatment per Care Advice  Answer Assessment - Initial Assessment Questions 1. ONSET: When did the cough begin?      Yesterday  2. SEVERITY: How bad is the cough today?      Moderate to severe  3. SPUTUM: Describe the color of your sputum (none, dry cough; clear, white, yellow, green)     Yes, has not checked color 4. HEMOPTYSIS: Are you coughing up any blood? If so ask: How much? (flecks, streaks, tablespoons, etc.)     No 5. DIFFICULTY BREATHING: Are you having difficulty breathing? If Yes, ask: How bad is it? (e.g., mild, moderate, severe)    - MILD: No SOB at rest, mild SOB with walking,  speaks normally in sentences, can lie down, no retractions, pulse < 100.    - MODERATE: SOB at rest, SOB with minimal exertion and prefers to sit, cannot lie down flat, speaks in phrases, mild retractions, audible wheezing, pulse 100-120.    - SEVERE: Very SOB at rest, speaks in single words, struggling to breathe, sitting hunched forward, retractions, pulse > 120      No 6. FEVER: Do you have a fever? If Yes, ask: What is your temperature, how was it measured, and when did it start?     Yes, 100.9 F, improves to 99.7 F with Tylenol   7. CARDIAC HISTORY: Do you have any history of heart disease? (e.g., heart attack, congestive heart failure)      No 8. LUNG HISTORY: Do you have any history of lung disease?  (e.g., pulmonary embolus, asthma, emphysema)     Asthma  9. PE RISK FACTORS: Do you have a history of blood clots? (or: recent major surgery, recent prolonged travel, bedridden)      Recently home from [place]  10. OTHER SYMPTOMS: Do you have any other symptoms? (e.g., runny nose, wheezing, chest pain)       Body aches, hoarse voice, scratchy throat, headache, chest pain with cough  11. PREGNANCY: Is there any chance you are pregnant? When was your last menstrual period?       No 12. TRAVEL: Have you traveled out of the country in the last month? (e.g., travel history, exposures)       No  Protocols used: Cough - Acute  Productive-A-AH

## 2023-05-18 NOTE — Telephone Encounter (Signed)
 Aware, will watch for correspondence

## 2023-05-18 NOTE — ED Provider Notes (Signed)
 [facility]    CSN: [id number] Arrival date & time: 05/18/23  1420      History   Chief Complaint No chief complaint on file.   HPI Courtney Pineda is a 53 y.o. female.  Patient presents with 1 day history of fever, chills, body aches, headache, congestion, runny nose, cough, nausea.  1 episode of emesis today.  No diarrhea.  No wheezing or shortness of breath.  She has not needed to use her rescue inhaler.  Tmax 100.  Tylenol  taken this afternoon.  The history is provided by the patient and medical records.    Past Medical History:  Diagnosis Date   Anemia    Asthma    Chronic right upper quadrant pain    Dysplastic nevus 09/16/2015   left infra axillary   Dysplastic nevus 10/27/2017   left side   History of kidney stones    Hyperemesis gravidarum 1999   [place] intestinal bacterial overgrowth 02/09/2018    Patient Active Problem List   Diagnosis Date Noted   Cerumen impaction 06/19/2022   SUI (stress urinary incontinence, female) 07/07/2021   Encounter for screening mammogram for breast cancer 04/30/2021   Asthma 11/12/2020   DOE (dyspnea on exertion) 07/09/2020   Allergic rhinitis 12/25/2019   Vaccine reaction 07/26/2019   Perimenopause 06/08/2018   Low HDL (under 40) 06/08/2018   [place] intestinal bacterial overgrowth 02/09/2018   Cyst of right kidney 06/04/2017   RUQ abdominal pain 05/31/2017   Elevated glucose level 05/22/2017   S/P laparoscopic hysterectomy 01/14/2017   Daily headache 06/01/2016   Chronic cough 06/01/2016   Encounter for routine gynecological examination 05/23/2014   Burning sensation of mouth 09/12/2013   Elevated transaminase level 01/13/2011   Encounter for routine adult medical exam with abnormal findings 01/05/2011   KIDNEY STONE 03/27/2010   STRESS REACTION, ACUTE, WITH EMOTIONAL DISTURBANCE 09/13/2007   Headache 09/13/2007    Past Surgical History:  Procedure Laterality Date   ABDOMINAL HYSTERECTOMY      CHOLECYSTECTOMY  06/1994   COLONOSCOPY WITH PROPOFOL  N/A 07/12/2017   Procedure: COLONOSCOPY WITH PROPOFOL ;  Surgeon: Unk Corinn Skiff, MD;  [facility] ENDOSCOPY;  Service: Gastroenterology;  Laterality: N/A;   CYSTOSCOPY N/A 01/14/2017   Procedure: CYSTOSCOPY;  Surgeon: Lake Read, MD;  Location: [facility] ORS;  Service: Gynecology;  Laterality: N/A;   ESOPHAGOGASTRODUODENOSCOPY (EGD) WITH PROPOFOL  N/A 07/12/2017   Procedure: ESOPHAGOGASTRODUODENOSCOPY (EGD) WITH PROPOFOL ;  Surgeon: Unk Corinn Skiff, MD;  Location: [facility] ENDOSCOPY;  Service: Gastroenterology;  Laterality: N/A;   KIDNEY STONE SURGERY     LITHOTRIPSY  03/2006   TONSILECTOMY, ADENOIDECTOMY, BILATERAL MYRINGOTOMY AND TUBES     myringotomy tubes   TOTAL LAPAROSCOPIC HYSTERECTOMY WITH SALPINGECTOMY Bilateral 01/14/2017   Procedure: HYSTERECTOMY TOTAL LAPAROSCOPIC WITH SALPINGECTOMY;  Surgeon: Lake Read, MD;  Location: [facility] ORS;  Service: Gynecology;  Laterality: Bilateral;    OB History     Gravida  2   Para  2   Term  2   Preterm  0   AB  0   Living  2      SAB  0   IAB  0   Ectopic  0   Multiple  0   Live Births  2            Home Medications    Prior to Admission medications   Medication Sig Start Date End Date Taking? Authorizing Provider  ondansetron  (ZOFRAN -ODT) 4 MG disintegrating tablet Take 1 tablet (4 mg  total) by mouth every 8 (eight) hours as needed for nausea or vomiting. 05/18/23  Yes Corlis Burnard DEL, NP  oseltamivir  (TAMIFLU ) 75 MG capsule Take 1 capsule (75 mg total) by mouth every 12 (twelve) hours. 05/18/23  Yes Corlis Burnard DEL, NP  acetaminophen  (TYLENOL ) 500 MG tablet Take 500 mg by mouth every 6 (six) hours as needed. Patient not taking: Reported on 02/18/2023    [provider]  albuterol  (VENTOLIN  HFA) 108 (90 Base) MCG/ACT inhaler Inhale 2 puffs into the lungs every 6 (six) hours as needed for wheezing or shortness of breath. 11/20/21   Tamea Dedra CROME, MD   Cholecalciferol (D3-1000 PO) Take by mouth.    [provider]  estradiol  (ESTRACE  VAGINAL) 0.1 MG/GM vaginal cream Insert 1 g vaginally once weekly as maintenance. Patient not taking: Reported on 02/18/2023 07/09/22   Copland, Alicia B, PA-C  Fluticasone -Umeclidin-Vilant (TRELEGY ELLIPTA ) 100-62.5-25 MCG/ACT AEPB Inhale 1 puff into the lungs daily. 04/22/23   Tamea Dedra CROME, MD  mometasone  (NASONEX ) 50 MCG/ACT nasal spray Place 1 spray into the nose 2 (two) times daily. Patient not taking: Reported on 02/18/2023 11/17/22   Tamea Dedra CROME, MD    Family History Family History  Problem Relation Age of Onset   Migraines Mother    Cancer Maternal Grandmother 28       brain tumor/breast cancer   Breast cancer Maternal Grandmother    Heart disease Maternal Grandfather        heart problems   Breast cancer Paternal Aunt 58       Contact   Colon cancer Neg Hx    Esophageal cancer Neg Hx    Liver disease Neg Hx    Pancreatic cancer Neg Hx    Stomach cancer Neg Hx    Inflammatory bowel disease Neg Hx     Social History Social History   Tobacco Use   Smoking status: Never   Smokeless tobacco: Never  Vaping Use   Vaping status: Never Used  Substance Use Topics   Alcohol use: No    Alcohol/week: 0.0 standard drinks of alcohol   Drug use: No     Allergies   Amoxicillin, Bee venom, Sulfa antibiotics, Sulfonamide derivatives, Covid-19 (mrna) vaccine, and Penicillins   Review of Systems Review of Systems  Constitutional:  Positive for chills and fever.  HENT:  Positive for congestion, rhinorrhea and voice change. Negative for ear pain and sore throat.   Respiratory:  Positive for cough. Negative for shortness of breath and wheezing.   Gastrointestinal:  Positive for nausea and vomiting. Negative for abdominal pain and diarrhea.  Neurological:  Positive for headaches.     Physical Exam Triage Vital Signs ED Triage Vitals [05/18/23 1505]  Encounter Vitals Group      BP 114/70     Systolic BP Percentile      Diastolic BP Percentile      Pulse Rate 82     Resp 18     Temp 99.6 F (37.6 C)     Temp src      SpO2 95 %     Weight      Height      Head Circumference      Peak Flow      Pain Score      Pain Loc      Pain Education      Exclude from Growth Chart    No data found.  Updated Vital Signs BP 114/70  Pulse 82   Temp 99.6 F (37.6 C)   Resp 18   LMP 01/08/2017 (Exact Date)   SpO2 95%   Visual Acuity Right Eye Distance:   Left Eye Distance:   Bilateral Distance:    Right Eye Near:   Left Eye Near:    Bilateral Near:     Physical Exam Constitutional:      General: She is not in acute distress. HENT:     Right Ear: Tympanic membrane normal.     Left Ear: Tympanic membrane normal.     Nose: Rhinorrhea present.     Mouth/Throat:     Mouth: Mucous membranes are moist.     Pharynx: Oropharynx is clear.  Cardiovascular:     Rate and Rhythm: Normal rate and regular rhythm.     Heart sounds: Normal heart sounds.  Pulmonary:     Effort: Pulmonary effort is normal. No respiratory distress.     Breath sounds: Normal breath sounds. No wheezing.  Abdominal:     General: Bowel sounds are normal.     Palpations: Abdomen is soft.     Tenderness: There is no abdominal tenderness. There is no guarding or rebound.  Neurological:     Mental Status: She is alert.      UC Treatments / Results  Labs (all labs ordered are listed, but only abnormal results are displayed) Labs Reviewed  POCT INFLUENZA A/B - Abnormal; Notable for the following components:      Result Value   Influenza A, POC Positive (*)    All other components within normal limits    EKG   Radiology No results found.  Procedures Procedures (including critical care time)  Medications Ordered in UC Medications - No data to display  Initial Impression / Assessment and Plan / UC Course  I have reviewed the triage vital signs and the nursing  notes.  Pertinent labs & imaging results that were available during my care of the patient were reviewed by me and considered in my medical decision making (see chart for details).    Influenza A, Nausea and vomiting.  Rapid flu test positive for influenza A.  Treating with Tamiflu .  Treating nausea and vomiting with Zofran .  Discussed symptomatic treatment including Tylenol  as needed, rest, hydration.  Education provided on influenza, nausea and vomiting.  Instructed patient to follow-up with her PCP if symptoms are not improving.  ED precautions given.  Patient agrees to plan of care.   Final Clinical Impressions(s) / UC Diagnoses   Final diagnoses:  Influenza A  Nausea and vomiting, unspecified vomiting type     Discharge Instructions      Take the Tamiflu  as directed.    Take the antinausea medication as directed.  Keep yourself hydrated with clear liquids, such as water.    Go to the emergency department if you have worsening symptoms.    Follow up with your primary care provider.       ED Prescriptions     Medication Sig Dispense Auth. Provider   ondansetron  (ZOFRAN -ODT) 4 MG disintegrating tablet Take 1 tablet (4 mg total) by mouth every 8 (eight) hours as needed for nausea or vomiting. 20 tablet Corlis Burnard DEL, NP   oseltamivir  (TAMIFLU ) 75 MG capsule Take 1 capsule (75 mg total) by mouth every 12 (twelve) hours. 10 capsule Corlis Burnard DEL, NP      PDMP not reviewed this encounter.   Corlis Burnard DEL, NP 05/18/23 [id number]

## 2023-05-18 NOTE — Discharge Instructions (Addendum)
Take the Tamiflu as directed.    Take the antinausea medication as directed.  Keep yourself hydrated with clear liquids, such as water.    Go to the emergency department if you have worsening symptoms.    Follow up with your primary care provider.

## 2023-05-24 ENCOUNTER — Other Ambulatory Visit: Payer: Self-pay | Admitting: Family Medicine

## 2023-05-24 DIAGNOSIS — Z1231 Encounter for screening mammogram for malignant neoplasm of breast: Secondary | ICD-10-CM

## 2023-06-13 ENCOUNTER — Telehealth: Payer: Self-pay | Admitting: Family Medicine

## 2023-06-13 DIAGNOSIS — R7401 Elevation of levels of liver transaminase levels: Secondary | ICD-10-CM

## 2023-06-13 DIAGNOSIS — R7309 Other abnormal glucose: Secondary | ICD-10-CM

## 2023-06-13 DIAGNOSIS — Z0000 Encounter for general adult medical examination without abnormal findings: Secondary | ICD-10-CM | POA: Insufficient documentation

## 2023-06-13 DIAGNOSIS — E786 Lipoprotein deficiency: Secondary | ICD-10-CM

## 2023-06-13 NOTE — Telephone Encounter (Signed)
-----   Message from Alvina Chou sent at 06/07/2023  9:08 AM EST ----- Regarding: Lab orders for TUE, 3.4.25 Patient is scheduled for CPX labs, please order future labs, Thanks , Camelia Eng

## 2023-06-15 ENCOUNTER — Other Ambulatory Visit (INDEPENDENT_AMBULATORY_CARE_PROVIDER_SITE_OTHER): Payer: No Typology Code available for payment source

## 2023-06-15 DIAGNOSIS — R7401 Elevation of levels of liver transaminase levels: Secondary | ICD-10-CM

## 2023-06-15 DIAGNOSIS — E786 Lipoprotein deficiency: Secondary | ICD-10-CM | POA: Diagnosis not present

## 2023-06-15 DIAGNOSIS — Z0000 Encounter for general adult medical examination without abnormal findings: Secondary | ICD-10-CM

## 2023-06-15 DIAGNOSIS — R7309 Other abnormal glucose: Secondary | ICD-10-CM | POA: Diagnosis not present

## 2023-06-15 LAB — CBC WITH DIFFERENTIAL/PLATELET
Basophils Absolute: 0 10*3/uL (ref 0.0–0.1)
Basophils Relative: 0.6 % (ref 0.0–3.0)
Eosinophils Absolute: 0.1 10*3/uL (ref 0.0–0.7)
Eosinophils Relative: 2.3 % (ref 0.0–5.0)
HCT: 42.3 % (ref 36.0–46.0)
Hemoglobin: 14.2 g/dL (ref 12.0–15.0)
Lymphocytes Relative: 32.8 % (ref 12.0–46.0)
Lymphs Abs: 1.5 10*3/uL (ref 0.7–4.0)
MCHC: 33.6 g/dL (ref 30.0–36.0)
MCV: 93.1 fl (ref 78.0–100.0)
Monocytes Absolute: 0.3 10*3/uL (ref 0.1–1.0)
Monocytes Relative: 7.4 % (ref 3.0–12.0)
Neutro Abs: 2.6 10*3/uL (ref 1.4–7.7)
Neutrophils Relative %: 56.9 % (ref 43.0–77.0)
Platelets: 231 10*3/uL (ref 150.0–400.0)
RBC: 4.55 Mil/uL (ref 3.87–5.11)
RDW: 13.1 % (ref 11.5–15.5)
WBC: 4.5 10*3/uL (ref 4.0–10.5)

## 2023-06-15 LAB — LIPID PANEL
Cholesterol: 181 mg/dL (ref 0–200)
HDL: 48.7 mg/dL (ref >39.00)
LDL Cholesterol: 120 mg/dL — ABNORMAL HIGH (ref 0–99)
NonHDL: 131.89
Total CHOL/HDL Ratio: 4
Triglycerides: 57 mg/dL (ref 0.0–149.0)
VLDL: 11.4 mg/dL (ref 0.0–40.0)

## 2023-06-15 LAB — COMPREHENSIVE METABOLIC PANEL
ALT: 54 U/L — ABNORMAL HIGH (ref 0–35)
AST: 40 U/L — ABNORMAL HIGH (ref 0–37)
Albumin: 4.2 g/dL (ref 3.5–5.2)
Alkaline Phosphatase: 91 U/L (ref 39–117)
BUN: 18 mg/dL (ref 6–23)
CO2: 31 meq/L (ref 19–32)
Calcium: 9.5 mg/dL (ref 8.4–10.5)
Chloride: 105 meq/L (ref 96–112)
Creatinine, Ser: 0.83 mg/dL (ref 0.40–1.20)
GFR: 82.32 mL/min (ref >60.00)
Glucose, Bld: 84 mg/dL (ref 70–99)
Potassium: 4.3 meq/L (ref 3.5–5.1)
Sodium: 141 meq/L (ref 135–145)
Total Bilirubin: 0.9 mg/dL (ref 0.2–1.2)
Total Protein: 6.8 g/dL (ref 6.0–8.3)

## 2023-06-15 LAB — TSH: TSH: 1.6 u[IU]/mL (ref 0.35–5.50)

## 2023-06-15 LAB — HEMOGLOBIN A1C: Hgb A1c MFr Bld: 5.2 % (ref 4.6–6.5)

## 2023-06-16 ENCOUNTER — Encounter: Payer: Self-pay | Admitting: Pulmonary Disease

## 2023-06-16 ENCOUNTER — Ambulatory Visit: Payer: No Typology Code available for payment source | Admitting: Pulmonary Disease

## 2023-06-16 VITALS — BP 110/68 | HR 61 | Temp 97.1°F | Ht 64.0 in | Wt 143.0 lb

## 2023-06-16 DIAGNOSIS — J329 Chronic sinusitis, unspecified: Secondary | ICD-10-CM

## 2023-06-16 DIAGNOSIS — J45991 Cough variant asthma: Secondary | ICD-10-CM

## 2023-06-16 LAB — NITRIC OXIDE: Nitric Oxide: 7

## 2023-06-16 NOTE — Progress Notes (Signed)
 Subjective:    Patient ID: Courtney Pineda, female    DOB: Jan 10, 1971, 53 y.o.   MRN: [id number]  Patient Care Team: Judy Pimple, MD as PCP - General  Chief Complaint  Patient presents with   Follow-up    Cough with clear. SOB.     BACKGROUND/INTERVAL: Courtney Pineda is a 53 year old lifelong never smoker who presents for follow-up on the issue of chronic cough.  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  Last evaluated here on 17 March 2023 at that time she was well compensated.  No exacerbations since that visit.   HPI Discussed the use of AI scribe software for clinical note transcription with the patient, who gave verbal consent to proceed.  History of Present Illness   Courtney Pineda is a 53 year old female with asthma who presents with cough and eye symptoms.  She has been experiencing a cough that is exacerbated by current weather conditions. The cough is productive of clear sputum and occurs sporadically, such as when sitting in church. Despite the cough, she does not always feel the need to use her rescue inhaler, although she carries it with her at all times. She sometimes feels a 'little bit of tightness' but does not use the inhaler unless necessary.  Her asthma is managed with daily Trelegy, although she had not taken it on the morning of the visit. She carries her rescue inhaler and uses it as needed. Her cough has improved since a recent bout with the flu, which she contracted after a trip to [place] on January 30th, affecting her family as well.   Prior to the flu episode, her cough has been well-controlled.  Overall she feels that she has good control of her asthma.  She states that the recent exacerbation of cough is actually improving.  She has not had any fevers, chills or sweats, no sputum production and no hemoptysis.  Has had some itchy watery eyes but these are responding to over-the-counter allergy medication   Review of  Systems A 10 point review of systems was performed and it is as noted above otherwise negative.   Patient Active Problem List   Diagnosis Date Noted   Encounter for screening for HIV 06/21/2023   Encounter for hepatitis C screening test for low risk patient 06/21/2023   Routine general medical examination at a health care facility 06/13/2023   Cerumen impaction 06/19/2022   SUI (stress urinary incontinence, female) 07/07/2021   Encounter for screening mammogram for breast cancer 04/30/2021   Asthma 11/12/2020   DOE (dyspnea on exertion) 07/09/2020   Allergic rhinitis 12/25/2019   Vaccine reaction 07/26/2019   Perimenopause 06/08/2018   Low HDL (under 40) 06/08/2018   Hernan intestinal bacterial overgrowth 02/09/2018   Cyst of right kidney 06/04/2017   RUQ abdominal pain 05/31/2017   Elevated glucose level 05/22/2017   S/P laparoscopic hysterectomy 01/14/2017   Cough variant asthma 06/01/2016   Encounter for routine gynecological examination 05/23/2014   Burning sensation of mouth 09/12/2013   Elevated transaminase level 01/13/2011   Encounter for routine adult medical exam with abnormal findings 01/05/2011   KIDNEY STONE 03/27/2010   Atypical chest pain 07/18/2008   STRESS REACTION, ACUTE, WITH EMOTIONAL DISTURBANCE 09/13/2007   Headache 09/13/2007    Social History   Tobacco Use   Smoking status: Never   Smokeless tobacco: Never  Substance Use Topics   Alcohol use: No    Alcohol/week: 0.0 standard drinks of alcohol  Allergies  Allergen Reactions   Amoxicillin Anaphylaxis   Bee Venom Anaphylaxis   Sulfa Antibiotics Anaphylaxis   Sulfonamide Derivatives Anaphylaxis    Lungs close up   Covid-19 (Mrna) Vaccine     J and J Local reaction    Penicillins Rash    Has patient had a PCN reaction causing immediate rash, facial/tongue/throat swelling, SOB or lightheadedness with hypotension: No Has patient had a PCN reaction causing severe rash involving mucus membranes  or skin necrosis: No Has patient had a PCN reaction that required hospitalization: No Has patient had a PCN reaction occurring within the last 10 years: No If all of the above answers are "NO", then may proceed with Cephalosporin use.     Current Meds  Medication Sig   albuterol (VENTOLIN HFA) 108 (90 Base) MCG/ACT inhaler Inhale 2 puffs into the lungs every 6 (six) hours as needed for wheezing or shortness of breath.   Cholecalciferol (D3-1000 PO) Take by mouth.   Fluticasone-Umeclidin-Vilant (TRELEGY ELLIPTA) 100-62.5-25 MCG/ACT AEPB Inhale 1 puff into the lungs daily.   [DISCONTINUED] ondansetron (ZOFRAN-ODT) 4 MG disintegrating tablet Take 1 tablet (4 mg total) by mouth every 8 (eight) hours as needed for nausea or vomiting.    Immunization History  Administered Date(s) Administered   Influenza Split 01/13/2011, 02/02/2012   Influenza Whole 03/22/2002, 12/27/2009   Influenza, Seasonal, Injecte, Preservative Fre 01/21/2023   Influenza,inj,Quad PF,6+ Mos 01/20/2013, 01/26/2014, 01/25/2015, 02/12/2016, 01/27/2017, 01/06/2018, 01/05/2019, 01/17/2020, 01/17/2021, 01/30/2022   Janssen (J&J) SARS-COV-2 Vaccination 07/19/2019   PNEUMOCOCCAL CONJUGATE-20 06/21/2023   Td 06/15/2002   Tdap 03/14/2013, 06/21/2023        Objective:     BP 110/68 (BP Location: Right Arm, Cuff Size: Normal)   Pulse 61   Temp (!) 97.1 F (36.2 C)   Ht 5\' 4"  (1.626 m)   Wt 143 lb (64.9 kg)   LMP 01/08/2017 (Exact Date)   SpO2 96%   BMI 24.55 kg/m   SpO2: 96 % O2 Device: None (Room air)  GENERAL: Well-developed, well-nourished woman, no acute distress fully ambulatory.  No conversational dyspnea. No cough. HEAD: Normocephalic, atraumatic.  EYES: Pupils equal, round, reactive to light.  No scleral icterus.  Conjunctiva clear. MOUTH: Dentition intact, oral mucosa moist. NECK: Supple. No thyromegaly. Trachea midline. No JVD.  No adenopathy. PULMONARY: Slightly diminished bilaterally.  No  adventitious sounds. CARDIOVASCULAR: S1 and S2. Regular rate and rhythm.  No rubs, murmurs or gallops heard. ABDOMEN: Benign. MUSCULOSKELETAL: No joint deformity, no clubbing, no edema.  NEUROLOGIC: No focal deficit, no gait disturbance, speech is fluent. SKIN: Intact,warm,dry. PSYCH: Mood and behavior normal.  Lab Results  Component Value Date   NITRICOXIDE 7 06/16/2023     Assessment & Plan:     ICD-10-CM   1. 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 She reports occasional cough and tightness but infrequent use of the rescue inhaler. Inflammation is well controlled, and lung sounds are clear. Discussed potential switch to a rescue inhaler with additional anti-inflammatory properties if symptoms worsen for enhanced anti-inflammatory effect and symptom control. - Continue Trelegy daily - Continue albuterol as needed - Monitor symptoms and report any worsening, especially during allergy season - Consider switching to a rescue inhaler with additional anti-inflammatory properties (AirSupra) if symptoms  worsen  Chronic Cough Chronic cough is exacerbated by certain conditions such as being in church. It is not currently a major issue but is noted by her and her husband.  Recent flareup may have been related to bout with influenza in January likely some mild postinfectious cough. - Monitor cough and report any significant changes      Advised if symptoms do not improve or worsen, to please contact office for sooner follow up or seek emergency care.    I spent 30 minutes of dedicated to the care of this patient on the date of this encounter to include pre-visit review of records, face-to-face time with the patient discussing conditions above, post visit ordering of testing, clinical documentation with  the electronic health record, making appropriate referrals as documented, and communicating necessary findings to members of the patients care team.     C. Danice Goltz, MD [facility]    *This note was generated using voice recognition software/Dragon and/or AI transcription program.  Despite best efforts to proofread, errors can occur which can change the meaning. Any transcriptional errors that result from this process are unintentional and may not be fully corrected at the time of dictation.

## 2023-06-16 NOTE — Patient Instructions (Signed)
 VISIT SUMMARY:  Today, we discussed your cough variant asthma. Your asthma appears to be well-controlled with your current medications, your cough appears to be very controlled.  You note that it is not currently a major issue.  YOUR PLAN:  -ASTHMA: Asthma is a condition where your airways become inflamed and narrow, making it hard to breathe. Your asthma is well-controlled with your current medications, Trelegy and albuterol. Continue taking Trelegy daily and use albuterol as needed. Monitor your symptoms and report any worsening, especially during allergy season. If your symptoms worsen, we may consider switching to a rescue inhaler with additional anti-inflammatory properties.  -CHRONIC COUGH DUE TO COUGH VARIANT ASTHMA: A chronic cough is a cough that lasts for a long time. Your cough is sometimes triggered by certain conditions, like being in church. It is not a major issue right now, but please monitor it and report any significant changes.  INSTRUCTIONS:  Please continue taking Trelegy daily and use your albuterol inhaler as needed. Monitor your symptoms and report any worsening, especially during allergy season. If your cough or asthma symptoms worsen, please contact us. We may consider switching to a rescue inhaler with additional anti-inflammatory properties if needed.  For more information, you can read your full clinical note, available in your patient portal.

## 2023-06-21 ENCOUNTER — Encounter: Payer: Self-pay | Admitting: Family Medicine

## 2023-06-21 ENCOUNTER — Ambulatory Visit (INDEPENDENT_AMBULATORY_CARE_PROVIDER_SITE_OTHER): Payer: No Typology Code available for payment source | Admitting: Family Medicine

## 2023-06-21 ENCOUNTER — Telehealth: Payer: Self-pay

## 2023-06-21 VITALS — BP 118/74 | HR 53 | Temp 98.2°F | Ht 64.0 in | Wt 141.1 lb

## 2023-06-21 DIAGNOSIS — R7401 Elevation of levels of liver transaminase levels: Secondary | ICD-10-CM

## 2023-06-21 DIAGNOSIS — Z0000 Encounter for general adult medical examination without abnormal findings: Secondary | ICD-10-CM | POA: Diagnosis not present

## 2023-06-21 DIAGNOSIS — Z23 Encounter for immunization: Secondary | ICD-10-CM | POA: Diagnosis not present

## 2023-06-21 DIAGNOSIS — R7309 Other abnormal glucose: Secondary | ICD-10-CM | POA: Diagnosis not present

## 2023-06-21 DIAGNOSIS — R1011 Right upper quadrant pain: Secondary | ICD-10-CM

## 2023-06-21 DIAGNOSIS — G8929 Other chronic pain: Secondary | ICD-10-CM

## 2023-06-21 DIAGNOSIS — E786 Lipoprotein deficiency: Secondary | ICD-10-CM | POA: Diagnosis not present

## 2023-06-21 DIAGNOSIS — Z114 Encounter for screening for human immunodeficiency virus [HIV]: Secondary | ICD-10-CM | POA: Insufficient documentation

## 2023-06-21 DIAGNOSIS — Z1159 Encounter for screening for other viral diseases: Secondary | ICD-10-CM | POA: Insufficient documentation

## 2023-06-21 DIAGNOSIS — R519 Headache, unspecified: Secondary | ICD-10-CM

## 2023-06-21 NOTE — Assessment & Plan Note (Signed)
Hep C screen ordered Low risk

## 2023-06-21 NOTE — Telephone Encounter (Signed)
 Called patient and informed patient at this time we do not have any new patient appointments available but we will call her if anything comes up or put her on the recall list to call her when new patient appointments become available.

## 2023-06-21 NOTE — Progress Notes (Signed)
 Subjective:    Patient ID: Courtney Pineda, female    DOB: May 02, 1970, 53 y.o.   MRN: [id number]  HPI  Here for health maintenance exam and to review chronic medical problems   Wt Readings from Last 3 Encounters:  06/21/23 141 lb 2 oz (64 kg)  06/16/23 143 lb (64.9 kg)  03/17/23 142 lb 3.2 oz (64.5 kg)   24.22 kg/m  Vitals:   06/21/23 0917  BP: 118/74  Pulse: (!) 53  Temp: 98.2 F (36.8 C)  SpO2: 98%    Immunization History  Administered Date(s) Administered   Influenza Split 01/13/2011, 02/02/2012   Influenza Whole 03/22/2002, 12/27/2009   Influenza, Seasonal, Injecte, Preservative Fre 01/21/2023   Influenza,inj,Quad PF,6+ Mos 01/20/2013, 01/26/2014, 01/25/2015, 02/12/2016, 01/27/2017, 01/06/2018, 01/05/2019, 01/17/2020, 01/17/2021, 01/30/2022   Janssen (J&J) SARS-COV-2 Vaccination 07/19/2019   PNEUMOCOCCAL CONJUGATE-20 06/21/2023   Td 06/15/2002   Tdap 03/14/2013, 06/21/2023    Health Maintenance Due  Topic Date Due   HIV Screening  Never done   Hepatitis C Screening  Never done   Interested in hep C / HIV screen   Feeling ok for the most part   Due for tetanus shot - due today  Had the flu last month / did get a flu shot   Pna vaccine -will do today   Shingrix- ? If later     Mammogram is scheduled for 07/12/23- due later this month Self breast exam- no lumps  Menopause symptoms   Gyn health Sees [facility] for gyn care  Pap 10/2022 Ascus with neg HPV / per gyn practice will repeat in a year  Has appointment later this month    Colon cancer screening  Colonoscopy 07/2017  10 y recall   Bone health   Falls-none  Cindee Lame  Supplements - vitamin D   Sister-osteopenia Sister - osteoporosis    Exercise  Grand daughter 13 mo keeping her busy  Menopause/ does not sleep well - too tired to exercise  Has a room set up for exercise     Mood    06/21/2023   10:01 AM 06/19/2022    3:38 PM 07/05/2020    3:28 PM 06/14/2020     4:17 PM 02/27/2020    4:20 PM  Depression screen PHQ 2/9  Decreased Interest 0 0 0 0 1  Down, Depressed, Hopeless 0 0 0  1  PHQ - 2 Score 0 0 0 0 2  Altered sleeping 3 1 1 1 1   Tired, decreased energy 3 1 1 2 1   Change in appetite 1 1 0 0 1  Feeling bad or failure about yourself  0 0 0 0 1  Trouble concentrating 0 0 0 0 1  Moving slowly or fidgety/restless 0 0 0 0 0  Suicidal thoughts 0 0 0 0 0  PHQ-9 Score 7 3 2 3 7   Difficult doing work/chores Not difficult at all Not difficult at all Not difficult at all Not difficult at all Somewhat difficult   Is tried  Menopause New baby in house/ grandchild Makes her irritable  Going through a lot   Does not need treatment currently   Sees pulmonary for cough variant asthma  On trelegy ellipta  Glucose Lab Results  Component Value Date   HGBA1C 5.2 06/15/2023   HGBA1C 5.3 06/11/2022   HGBA1C 5.1 06/10/2021   Below prediabetic range    Liver Lab Results  Component Value Date   ALT 54 (H) 06/15/2023   AST  40 (H) 06/15/2023   ALKPHOS 91 06/15/2023   BILITOT 0.9 06/15/2023  These are up  Had had ccy  Liver appeared normal on [place] a year ago  Right side continues to bother her   Avoiding tylenol and alcohol     History of low HDL Lab Results  Component Value Date   CHOL 181 06/15/2023   CHOL 179 06/11/2022   CHOL 168 06/10/2021   Lab Results  Component Value Date   HDL 48.70 06/15/2023   HDL 48.20 06/11/2022   HDL 44.30 06/10/2021   Lab Results  Component Value Date   LDLCALC 120 (H) 06/15/2023   LDLCALC 113 (H) 06/11/2022   LDLCALC 113 (H) 06/10/2021   Lab Results  Component Value Date   TRIG 57.0 06/15/2023   TRIG 90.0 06/11/2022   TRIG 52.0 06/10/2021   Lab Results  Component Value Date   CHOLHDL 4 06/15/2023   CHOLHDL 4 06/11/2022   CHOLHDL 4 06/10/2021   No results found for: "LDLDIRECT" Struggles with this  Had CT chest 2022 - normal   Diet needs to be better  Air fryer     Lab Results   Component Value Date   NA 141 06/15/2023   K 4.3 06/15/2023   CO2 31 06/15/2023   GLUCOSE 84 06/15/2023   BUN 18 06/15/2023   CREATININE 0.83 06/15/2023   CALCIUM 9.5 06/15/2023   GFR 82.32 06/15/2023   GFRNONAA >60 01/15/2017   Lab Results  Component Value Date   WBC 4.5 06/15/2023   HGB 14.2 06/15/2023   HCT 42.3 06/15/2023   MCV 93.1 06/15/2023   PLT 231.0 06/15/2023   Lab Results  Component Value Date   TSH 1.60 06/15/2023   Headaches -still happening  Arms go numb sometimes    Patient Active Problem List   Diagnosis Date Noted   Encounter for screening for HIV 06/21/2023   Encounter for hepatitis C screening test for low risk patient 06/21/2023   Routine general medical examination at a health care facility 06/13/2023   Cerumen impaction 06/19/2022   SUI (stress urinary incontinence, female) 07/07/2021   Encounter for screening mammogram for breast cancer 04/30/2021   Asthma 11/12/2020   DOE (dyspnea on exertion) 07/09/2020   Allergic rhinitis 12/25/2019   Vaccine reaction 07/26/2019   Perimenopause 06/08/2018   Low HDL (under 40) 06/08/2018   [place] intestinal bacterial overgrowth 02/09/2018   Cyst of right kidney 06/04/2017   RUQ abdominal pain 05/31/2017   Elevated glucose level 05/22/2017   S/P laparoscopic hysterectomy 01/14/2017   Cough variant asthma 06/01/2016   Encounter for routine gynecological examination 05/23/2014   Burning sensation of mouth 09/12/2013   Elevated transaminase level 01/13/2011   Encounter for routine adult medical exam with abnormal findings 01/05/2011   KIDNEY STONE 03/27/2010   STRESS REACTION, ACUTE, WITH EMOTIONAL DISTURBANCE 09/13/2007   Headache 09/13/2007   Past Medical History:  Diagnosis Date   Anemia    Asthma    Chronic right upper quadrant pain    Dysplastic nevus 09/16/2015   left infra axillary   Dysplastic nevus 10/27/2017   left side   History of kidney stones    Hyperemesis gravidarum 1999   [place]  intestinal bacterial overgrowth 02/09/2018   Past Surgical History:  Procedure Laterality Date   ABDOMINAL HYSTERECTOMY     CHOLECYSTECTOMY  06/1994   COLONOSCOPY WITH PROPOFOL N/A 07/12/2017   Procedure: COLONOSCOPY WITH PROPOFOL;  Surgeon: Toney Reil, MD;  Location: [facility]  ENDOSCOPY;  Service: Gastroenterology;  Laterality: N/A;   CYSTOSCOPY N/A 01/14/2017   Procedure: CYSTOSCOPY;  Surgeon: Vena Austria, MD;  Location: [facility] ORS;  Service: Gynecology;  Laterality: N/A;   ESOPHAGOGASTRODUODENOSCOPY (EGD) WITH PROPOFOL N/A 07/12/2017   Procedure: ESOPHAGOGASTRODUODENOSCOPY (EGD) WITH PROPOFOL;  Surgeon: Toney Reil, MD;  Location: [facility] ENDOSCOPY;  Service: Gastroenterology;  Laterality: N/A;   KIDNEY STONE SURGERY     LITHOTRIPSY  03/2006   TONSILECTOMY, ADENOIDECTOMY, BILATERAL MYRINGOTOMY AND TUBES     myringotomy tubes   TOTAL LAPAROSCOPIC HYSTERECTOMY WITH SALPINGECTOMY Bilateral 01/14/2017   Procedure: HYSTERECTOMY TOTAL LAPAROSCOPIC WITH SALPINGECTOMY;  Surgeon: Vena Austria, MD;  Location: [facility] ORS;  Service: Gynecology;  Laterality: Bilateral;   Social History   Tobacco Use   Smoking status: Never   Smokeless tobacco: Never  Vaping Use   Vaping status: Never Used  Substance Use Topics   Alcohol use: No    Alcohol/week: 0.0 standard drinks of alcohol   Drug use: No   Family History  Problem Relation Age of Onset   Migraines Mother    Cancer Maternal Grandmother 34       brain tumor/breast cancer   Breast cancer Maternal Grandmother    Heart disease Maternal Grandfather        heart problems   Breast cancer Paternal Aunt 40       Contact   Colon cancer Neg Hx    Esophageal cancer Neg Hx    Liver disease Neg Hx    Pancreatic cancer Neg Hx    Stomach cancer Neg Hx    Inflammatory bowel disease Neg Hx    Allergies  Allergen Reactions   Amoxicillin Anaphylaxis   Bee Venom Anaphylaxis   Sulfa Antibiotics Anaphylaxis   Sulfonamide Derivatives  Anaphylaxis    Lungs close up   Covid-19 (Mrna) Vaccine     J and J Local reaction    Penicillins Rash    Has patient had a PCN reaction causing immediate rash, facial/tongue/throat swelling, SOB or lightheadedness with hypotension: No Has patient had a PCN reaction causing severe rash involving mucus membranes or skin necrosis: No Has patient had a PCN reaction that required hospitalization: No Has patient had a PCN reaction occurring within the last 10 years: No If all of the above answers are "NO", then may proceed with Cephalosporin use.    Current Outpatient Medications on File Prior to Visit  Medication Sig Dispense Refill   albuterol (VENTOLIN HFA) 108 (90 Base) MCG/ACT inhaler Inhale 2 puffs into the lungs every 6 (six) hours as needed for wheezing or shortness of breath. 8 g 2   Cholecalciferol (D3-1000 PO) Take by mouth.     Fluticasone-Umeclidin-Vilant (TRELEGY ELLIPTA) 100-62.5-25 MCG/ACT AEPB Inhale 1 puff into the lungs daily. 1 each 11   No current facility-administered medications on file prior to visit.    Review of Systems  Constitutional:  Negative for activity change, appetite change, fatigue, fever and unexpected weight change.  HENT:  Negative for congestion, ear pain, rhinorrhea, sinus pressure and sore throat.   Eyes:  Negative for pain, redness and visual disturbance.  Respiratory:  Negative for cough, shortness of breath and wheezing.   Cardiovascular:  Negative for chest pain and palpitations.  Gastrointestinal:  Positive for abdominal pain. Negative for abdominal distention, blood in stool, constipation and diarrhea.  Endocrine: Negative for polydipsia and polyuria.  Genitourinary:  Negative for dysuria, frequency and urgency.  Musculoskeletal:  Negative for arthralgias, back pain and myalgias.  Skin:  Negative for pallor and rash.  Allergic/Immunologic: Negative for environmental allergies.  Neurological:  Positive for numbness and headaches. Negative  for dizziness and syncope.  Hematological:  Negative for adenopathy. Does not bruise/bleed easily.  Psychiatric/Behavioral:  Negative for decreased concentration and dysphoric mood. The patient is not nervous/anxious.        Objective:   Physical Exam Constitutional:      General: She is not in acute distress.    Appearance: Normal appearance. She is well-developed and normal weight. She is not ill-appearing or diaphoretic.  HENT:     Head: Normocephalic and atraumatic.     Right Ear: Tympanic membrane, ear canal and external ear normal.     Left Ear: Tympanic membrane, ear canal and external ear normal.     Nose: Nose normal. No congestion.     Mouth/Throat:     Mouth: Mucous membranes are moist.     Pharynx: Oropharynx is clear. No posterior oropharyngeal erythema.  Eyes:     General: No scleral icterus.    Extraocular Movements: Extraocular movements intact.     Conjunctiva/sclera: Conjunctivae normal.     Pupils: Pupils are equal, round, and reactive to light.  Neck:     Thyroid: No thyromegaly.     Vascular: No carotid bruit or JVD.  Cardiovascular:     Rate and Rhythm: Normal rate and regular rhythm.     Pulses: Normal pulses.     Heart sounds: Normal heart sounds.     No gallop.  Pulmonary:     Effort: Pulmonary effort is normal. No respiratory distress.     Breath sounds: Normal breath sounds. No wheezing.     Comments: Good air exch Chest:     Chest wall: No tenderness.  Abdominal:     General: Bowel sounds are normal. There is no distension or abdominal bruit.     Palpations: Abdomen is soft. There is no mass.     Tenderness: There is abdominal tenderness. There is no guarding or rebound.     Hernia: No hernia is present.     Comments: Mild RuQ tendenress No HSM   Genitourinary:    Comments: Breast and pelvic exam are done by gyn provider   Musculoskeletal:        General: No tenderness. Normal range of motion.     Cervical back: Normal range of motion and  neck supple. No rigidity. No muscular tenderness.     Right lower leg: No edema.     Left lower leg: No edema.     Comments: No kyphosis   Lymphadenopathy:     Cervical: No cervical adenopathy.  Skin:    General: Skin is warm and dry.     Coloration: Skin is not pale.     Findings: No erythema or rash.     Comments: Solar lentigines diffusely   Neurological:     Mental Status: She is alert. Mental status is at baseline.     Cranial Nerves: No cranial nerve deficit.     Motor: No abnormal muscle tone.     Coordination: Coordination normal.     Gait: Gait normal.     Deep Tendon Reflexes: Reflexes are normal and symmetric. Reflexes normal.  Psychiatric:        Mood and Affect: Mood normal.        Cognition and Memory: Cognition and memory normal.           Assessment & Plan:   Problem  List Items Addressed This Visit       Other   RUQ abdominal pain   Ongoing Had ccy  LFT still mildly elevated Ref to GI  Pt will call for appt      Relevant Orders   Ambulatory referral to Gastroenterology   Routine general medical examination at a health care facility - Primary   Reviewed health habits including diet and exercise and skin cancer prevention Reviewed appropriate screening tests for age  Also reviewed health mt list, fam hx and immunization status , as well as social and family history   See HPI Labs reviewed and ordered Health Maintenance  Topic Date Due   HIV Screening  Never done   Hepatitis C Screening  Never done   Zoster (Shingles) Vaccine (1 of 2) 09/21/2023*   COVID-19 Vaccine (2 - Janssen risk series) 06/18/2026*   Mammogram  07/09/2023   Colon Cancer Screening  07/13/2027   DTaP/Tdap/Td vaccine (4 - Td or Tdap) 06/20/2033   Pneumococcal Vaccination  Completed   Flu Shot  Completed   HPV Vaccine  Aged Out  *Topic was postponed. The date shown is not the original due date.   Prevnar 20 vaccine and Td given today  May consider shingrix later  [facility]  scheduled 07/12/23  Gyn appointment upcoming  Discussed fall prevention, supplements and exercise for bone density  PHQ 3   declines treatment / tired with new baby in house      Low HDL (under 40)   Disc goals for lipids and reasons to control them Rev last labs with pt Rev low sat fat diet in detail Improved at 48.7 Encouraged exercise       Headache   More headaches Some in am Waking up with tingling hands /arms   Will schedule follow up visit to discuss these problems      Encounter for screening for HIV   HIV screening ordered  Low risk       Relevant Orders   HIV Antibody (routine testing w rflx)   Encounter for hepatitis C screening test for low risk patient   Hep C screen ordered  Low risk      Relevant Orders   Hepatitis C Antibody   Elevated transaminase level   ALT 54 and AST 40  Watching diet No tylenol or etoh  Still has intermittent RUQ pain  No fatty liver on most recent imaging   Recommend follow up with GI  Referral done      Relevant Orders   Ambulatory referral to Gastroenterology   Elevated glucose level   Lab Results  Component Value Date   HGBA1C 5.2 06/15/2023   HGBA1C 5.3 06/11/2022   HGBA1C 5.1 06/10/2021   Not in prediabetic range disc imp of low glycemic diet and wt loss to prevent DM2       Other Visit Diagnoses       Need for pneumococcal 20-valent conjugate vaccination       Relevant Orders   Pneumococcal conjugate vaccine 20-valent (Prevnar 20) (Completed)     Need for Tdap vaccination       Relevant Orders   Tdap vaccine greater than or equal to 7yo IM (Completed)

## 2023-06-21 NOTE — Telephone Encounter (Signed)
 The patient called to discuss elevated liver enzymes and right upper abdomen pain with Dr. Allegra Lai. I informed her that we currently do not have available appointments for new patients. I offered to add her to our call-back list and assured her that we will reach out as soon as an appointment becomes available. Additionally, I informed the patient that I would send a message to Dr. Allegra Lai to inquire about any further recommendations. I will contact the patient once I receive guidance from Dr. Allegra Lai.

## 2023-06-21 NOTE — Assessment & Plan Note (Signed)
 Disc goals for lipids and reasons to control them Rev last labs with pt Rev low sat fat diet in detail Improved at 48.7 Encouraged exercise

## 2023-06-21 NOTE — Assessment & Plan Note (Signed)
 Reviewed health habits including diet and exercise and skin cancer prevention Reviewed appropriate screening tests for age  Also reviewed health mt list, fam hx and immunization status , as well as social and family history   See HPI Labs reviewed and ordered Health Maintenance  Topic Date Due   HIV Screening  Never done   Hepatitis C Screening  Never done   Zoster (Shingles) Vaccine (1 of 2) 09/21/2023*   COVID-19 Vaccine (2 - Janssen risk series) 06/18/2026*   Mammogram  07/09/2023   Colon Cancer Screening  07/13/2027   DTaP/Tdap/Td vaccine (4 - Td or Tdap) 06/20/2033   Pneumococcal Vaccination  Completed   Flu Shot  Completed   HPV Vaccine  Aged Out  *Topic was postponed. The date shown is not the original due date.   Prevnar 20 vaccine and Td given today  May consider shingrix later  [facility] scheduled 07/12/23  Gyn appointment upcoming  Discussed fall prevention, supplements and exercise for bone density  PHQ 3   declines treatment / tired with new baby in house

## 2023-06-21 NOTE — Assessment & Plan Note (Signed)
 ALT 54 and AST 40  Watching diet No tylenol or etoh  Still has intermittent RUQ pain  No fatty liver on most recent imaging   Recommend follow up with GI  Referral done

## 2023-06-21 NOTE — Assessment & Plan Note (Signed)
 Lab Results  Component Value Date   HGBA1C 5.2 06/15/2023   HGBA1C 5.3 06/11/2022   HGBA1C 5.1 06/10/2021   Not in prediabetic range disc imp of low glycemic diet and wt loss to prevent DM2

## 2023-06-21 NOTE — Patient Instructions (Addendum)
 Stay active Add some strength training to your routine, this is important for bone and brain health and can reduce your risk of falls and help your body use insulin properly and regulate weight  Light weights, exercise bands , and internet videos are a good way to start  Yoga (chair or regular), machines , floor exercises or a gym with machines are also good options   Tetanus shot today  Pneumonia shot today   If you are interested in the shingles vaccine series (Shingrix), call your insurance or pharmacy to check on coverage and location it must be given.  If affordable - you can schedule it here or at your pharmacy depending on coverage   Talk to your gyn about bone density testing    Cholesterol Avoid red meat/ fried foods/ egg yolks/ fatty breakfast meats/ butter, cheese and high fat dairy/ and shellfish   For general health Try to get most of your carbohydrates from produce (with the exception of white potatoes) and whole grains Eat less bread/pasta/rice/snack foods/cereals/sweets and other items from the middle of the grocery store (processed carbs)    Avoid tylenol  Call to schedule a visit with GI to discuss liver test elevation and side pain   [facility]  [phone]  HIV and hep C screen today

## 2023-06-21 NOTE — Telephone Encounter (Signed)
 Patient was seen 09/27/2017 by you last she then transfer her care to [facility] and was last seen by them on 05/20/2018

## 2023-06-21 NOTE — Assessment & Plan Note (Signed)
 Ongoing Had ccy  LFT still mildly elevated Ref to GI  Pt will call for appt

## 2023-06-21 NOTE — Telephone Encounter (Signed)
 Agree, if I can see her before April 15, I can see her as new patient otherwise I will not be able to see her  RV

## 2023-06-21 NOTE — Assessment & Plan Note (Signed)
 More headaches Some in am Waking up with tingling hands /arms   Will schedule follow up visit to discuss these problems

## 2023-06-21 NOTE — Assessment & Plan Note (Signed)
 HIV screening ordered  Low risk

## 2023-06-22 ENCOUNTER — Ambulatory Visit: Payer: Self-pay | Admitting: Family Medicine

## 2023-06-22 ENCOUNTER — Emergency Department

## 2023-06-22 ENCOUNTER — Encounter: Payer: Self-pay | Admitting: Family Medicine

## 2023-06-22 ENCOUNTER — Emergency Department
Admission: EM | Admit: 2023-06-22 | Discharge: 2023-06-22 | Disposition: A | Discharge status: Home / Self Care | Attending: Emergency Medicine | Admitting: Emergency Medicine

## 2023-06-22 ENCOUNTER — Other Ambulatory Visit: Payer: Self-pay

## 2023-06-22 DIAGNOSIS — R0789 Other chest pain: Secondary | ICD-10-CM | POA: Diagnosis present

## 2023-06-22 LAB — HEPATITIS C ANTIBODY: Hepatitis C Ab: NONREACTIVE

## 2023-06-22 LAB — CBC
HCT: 42.2 % (ref 36.0–46.0)
Hemoglobin: 14 g/dL (ref 12.0–15.0)
MCH: 31 pg (ref 26.0–34.0)
MCHC: 33.2 g/dL (ref 30.0–36.0)
MCV: 93.4 fL (ref 80.0–100.0)
Platelets: 276 10*3/uL (ref 150–400)
RBC: 4.52 MIL/uL (ref 3.87–5.11)
RDW: 12.5 % (ref 11.5–15.5)
WBC: 8.4 10*3/uL (ref 4.0–10.5)
nRBC: 0 % (ref 0.0–0.2)

## 2023-06-22 LAB — HIV ANTIBODY (ROUTINE TESTING W REFLEX): HIV 1&2 Ab, 4th Generation: NONREACTIVE

## 2023-06-22 LAB — BASIC METABOLIC PANEL
Anion gap: 11 (ref 5–15)
BUN: 15 mg/dL (ref 6–20)
CO2: 24 mmol/L (ref 22–32)
Calcium: 9.3 mg/dL (ref 8.9–10.3)
Chloride: 104 mmol/L (ref 98–111)
Creatinine, Ser: 0.87 mg/dL (ref 0.44–1.00)
GFR, Estimated: >60 mL/min (ref >60)
Glucose, Bld: 107 mg/dL — ABNORMAL HIGH (ref 70–99)
Potassium: 3.6 mmol/L (ref 3.5–5.1)
Sodium: 139 mmol/L (ref 135–145)

## 2023-06-22 LAB — TROPONIN I (HIGH SENSITIVITY): Troponin I (High Sensitivity): 3 ng/L (ref <18)

## 2023-06-22 LAB — CBG MONITORING, ED: Glucose-Capillary: 114 mg/dL — ABNORMAL HIGH (ref 70–99)

## 2023-06-22 NOTE — Telephone Encounter (Signed)
Pt is in ER now.

## 2023-06-22 NOTE — Telephone Encounter (Signed)
 Copied from CRM [id number]. Topic: Clinical - Red Word Triage >> Jun 22, 2023  1:20 PM Isabell A wrote: Red Word that prompted transfer to Nurse Triage: Patient experiencing sharp pain underneath her left breast - blood pressure is 124/72  Chief Complaint: sharp chest pain under left breast BP 147/87 and multiple BP is that range normal range is 110 systolic  Symptoms: no other sx Frequency: today at 1305 Pertinent Negatives: Patient denies vomiting, nausea, sweating, referred pain to left arm, jaw, ear, back.nausea Disposition: [x] ED /[] Urgent Care (no appt availability in office) / [] Appointment(In office/virtual)/ []  [facility]/ [] Home Care/ [x] Refused Recommended Disposition /[] [facility]/ []  Follow-up with PCP Additional Notes: later in the call after being advised to go to ED chest pain was rated 4/10 and BP 118/76 HR 65. Advised pt that she needs to go to ED with the sudden onset of sharp and severe chest pain and elevated BP. Pt reluctant. Called office and advised Marland Kitchen of pts sx and refusal. Advised pt that she still needs to go to ED pt stated she might go.  Reason for Disposition  SEVERE chest pain  [1] Chest pain lasts > 5 minutes AND [2] occurred in past 3 days (72 hours) (Exception: Feels exactly the same as previously diagnosed heartburn and has accompanying sour taste in mouth.)  Answer Assessment - Initial Assessment Questions 1. LOCATION: "Where does it hurt?"       Under left breast  2. RADIATION: "Does the pain go anywhere else?" (e.g., into neck, jaw, arms, back)     no 3. ONSET: "When did the chest pain begin?" (Minutes, hours or days)      1:05 pm 4. PATTERN: "Does the pain come and go, or has it been constant since it started?"  "Does it get worse with exertion?"      Constant  5. DURATION: "How long does it last" (e.g., seconds, minutes, hours)     *No Answer* 6. SEVERITY: "How bad is the pain?"  (e.g., Scale 1-10; mild, moderate, or  severe)    - MILD (1-3): doesn't interfere with normal activities     - MODERATE (4-7): interferes with normal activities or awakens from sleep    - SEVERE (8-10): excruciating pain, unable to do any normal activities       severe 7. CARDIAC RISK FACTORS: "Do you have any history of heart problems or risk factors for heart disease?" (e.g., angina, prior heart attack; diabetes, high blood pressure, high cholesterol, smoker, or strong family history of heart disease)     *No Answer* 8. PULMONARY RISK FACTORS: "Do you have any history of lung disease?"  (e.g., blood clots in lung, asthma, emphysema, birth control pills)     *No Answer* 9. CAUSE: "What do you think is causing the chest pain?"     *No Answer* 10. OTHER SYMPTOMS: "Do you have any other symptoms?" (e.g., dizziness, nausea, vomiting, sweating, fever, difficulty breathing, cough)       no 11. PREGNANCY: "Is there any chance you are pregnant?" "When was your last menstrual period?"       No  Protocols used: Chest Pain-A-AH

## 2023-06-22 NOTE — ED Notes (Signed)
 See triage note  Presents with some concerns with an episode of sharp left breast pain   States episode lasted about 20 mins   then the pain stopped  Pain free on arrival to treatment room

## 2023-06-22 NOTE — Telephone Encounter (Signed)
Please check in with her ,thanks

## 2023-06-22 NOTE — Telephone Encounter (Signed)
 The patient called and left a voicemail regarding the call from yesterday. She was informed that Dr. Allegra Lai is not accepting new patients at this time, but she is on the call-back list. The patient mentioned that she spoke with her provider, who advised that she can see any available provider. I returned her call to confirm receipt of her message and informed her that, at this time, none of our providers have availability for new patient appointments. She remains on the waiting list. I also suggested that she try another GI practice to see if they can accommodate her sooner.

## 2023-06-22 NOTE — ED Triage Notes (Signed)
 Pt to ED via POV from home. Pt reports has a sharp pain under her left breast that stayed about . Pt reports taking BP and was running high for her. BP Highest reading 145/81. Pt reports SBP <110.   Pt reports left arm feels funny. Pt received tetanus shot yesterday in left shoulder and PNA shot in right shoulder.

## 2023-06-22 NOTE — Discharge Instructions (Signed)
 Follow-up with your primary care provider as needed.  Return to the ER for new, worsening, or persistent severe chest pain, difficulty breathing, weakness or lightheadedness, or any other new or worsening symptoms that concern you.

## 2023-06-22 NOTE — ED Provider Notes (Addendum)
 [facility] Provider Note    Event Date/Time   First MD Initiated Contact with Patient 06/22/23 1658     (approximate)   History   Breast Pain   HPI  Courtney Pineda is a 53 y.o. female with history of asthma, anemia, and kidney stones who presents with chest pain, acute onset around 1 PM, occurring in the left lower part of her chest below the breast, and lasting about 20 minutes.  It was sharp in quality.  It completely resolved and has not recurred.  The patient denies any prior history of similar pain.  She had no associated shortness of breath or lightheadedness.  She has no nausea or vomiting.  The patient states that she checked her blood pressure during the episode and was concerned that it was as high as 145 systolic; she states that her blood pressure usually runs around 110 systolic.  I reviewed the past medical records.  The patient's most recent outpatient encounter was with Dr. Milinda Antis from family medicine yesterday for a health maintenance exam.  The patient reported some right upper quadrant abdominal pain at that time.   Physical Exam   Triage Vital Signs: ED Triage Vitals  Encounter Vitals Group     BP 06/22/23 1501 131/71     Systolic BP Percentile --      Diastolic BP Percentile --      Pulse Rate 06/22/23 1501 65     Resp 06/22/23 1501 16     Temp 06/22/23 1501 98.4 F (36.9 C)     Temp Source 06/22/23 1501 Oral     SpO2 06/22/23 1501 100 %     Weight --      Height --      Head Circumference --      Peak Flow --      Pain Score 06/22/23 1517 2     Pain Loc --      Pain Education --      Exclude from Growth Chart --     Most recent vital signs: Vitals:   06/22/23 1501  BP: 131/71  Pulse: 65  Resp: 16  Temp: 98.4 F (36.9 C)  SpO2: 100%     General: Awake, no distress.  CV:  Good peripheral perfusion.  It sounds. Resp:  Normal effort.  Lungs CTAB. Abd:  Soft and nontender.  No distention.  Other:  Chest wall  nontender.  No peripheral edema.  No calf or popliteal swelling or tenderness.   ED Results / Procedures / Treatments   Labs (all labs ordered are listed, but only abnormal results are displayed) Labs Reviewed  BASIC METABOLIC PANEL - Abnormal; Notable for the following components:      Result Value   Glucose, Bld 107 (*)    All other components within normal limits  CBG MONITORING, ED - Abnormal; Notable for the following components:   Glucose-Capillary 114 (*)    All other components within normal limits  CBC  TROPONIN I (HIGH SENSITIVITY)     EKG  ED ECG REPORT I, Dionne Bucy, the attending physician, personally viewed and interpreted this ECG.  Date: 06/22/2023 EKG Time: 1506 Rate: 63 Rhythm: normal sinus rhythm QRS Axis: normal Intervals: normal ST/T Wave abnormalities: normal Narrative Interpretation: no evidence of acute ischemia    RADIOLOGY  Chest x-ray: I independently viewed and interpreted the images; there is no focal consolidation or edema   PROCEDURES:  Critical Care performed: No  Procedures  MEDICATIONS ORDERED IN ED: Medications - No data to display   IMPRESSION / MDM / ASSESSMENT AND PLAN / ED COURSE  I reviewed the triage vital signs and the nursing notes.  53 year old female presents with a relatively brief episode of atypical nonexertional chest pain that has now resolved.  She has been asymptomatic for the last several hours.  On exam she is well-appearing.  Vital signs are normal.  Physical exam is unremarkable for acute findings.  Differential diagnosis includes, but is not limited to, costochondritis or other musculoskeletal chest wall pain, neuropathic pain, GERD, gastritis, gas, less likely ACS.  There is no clinical evidence for PE given the resolved symptoms.  Similarly, there is no clinical evidence for aortic dissection or other vascular etiology.  Patient's presentation is most consistent with acute complicated  illness / injury requiring diagnostic workup.  BMP and CBC show no acute abnormality.  Initial troponin is negative.  The blood was drawn about 2 hours after the symptoms started; given this timing and the overall low risk of ACS there is no indication for a repeat.  EKG is nonischemic.  Chest x-ray is clear.  Blood pressure is improving.  At this time, the patient is stable for discharge home.  I counseled her on the results of the workup.  I gave strict return precautions and she expresses understanding.  ----------------------------------------- 7:46 PM on 06/22/2023 -----------------------------------------  Radiology report confirms negative x-ray.  I called the patient and informed her of this result.   FINAL CLINICAL IMPRESSION(S) / ED DIAGNOSES   Final diagnoses:  Atypical chest pain     Rx / DC Orders   ED Discharge Orders     None        Note:  This document was prepared using Dragon voice recognition software and may include unintentional dictation errors.    Dionne Bucy, MD 06/22/23 1750    Dionne Bucy, MD 06/22/23 1947

## 2023-06-23 NOTE — Telephone Encounter (Signed)
 The patient called to schedule an appointment with any available provider, as she was informed by her current provider that she could see any eligible provider in Dr. Verdis Prime absence. I informed her that we still do not have any available new patient appointments at this time. However, she is on the call-back list in case an appointment becomes available due to a cancellation.

## 2023-06-25 ENCOUNTER — Ambulatory Visit: Admitting: Family Medicine

## 2023-06-25 ENCOUNTER — Encounter: Payer: Self-pay | Admitting: Family Medicine

## 2023-06-25 VITALS — BP 110/80 | HR 60 | Temp 98.0°F | Ht 64.0 in | Wt 143.0 lb

## 2023-06-25 DIAGNOSIS — R1011 Right upper quadrant pain: Secondary | ICD-10-CM

## 2023-06-25 DIAGNOSIS — R0789 Other chest pain: Secondary | ICD-10-CM | POA: Diagnosis not present

## 2023-06-25 NOTE — Assessment & Plan Note (Signed)
 20 minute episode on left side under ribs w/o associated symptoms  Reassuring ER work up on 3/11 Reviewed hospital records, lab results and studies in detail    Now resolved  Reassuring exam/ slightly tenderness in LUQ of abd under ribs  No bony tenderness   Suspect this may have been a spasm /possible diaphragm or chest wall Encouraged warm compress if needed  Encouraged to call if this re occurs Update if not starting to improve in a week or if worsening

## 2023-06-25 NOTE — Assessment & Plan Note (Signed)
 This is fairly stable  Some tenderness today  Seems unrelated to recent brief left lower cp (Reviewed hospital records, lab results and studies in detail )  Will continue to watch diet and avoid triggers Waiting for appointment with [facility]   Call back and Er precautions noted in detail today

## 2023-06-25 NOTE — Patient Instructions (Signed)
 I wonder if your pain was from a muscle spasm of abdomen or abdomen  Your work up was reassuring as is your exam today   If this happens again - try a warm compress/deep breath and stretching  Let us know if if does not improve  If severe -go back to the ER (or if any trouble breathing or new symptoms)    Keep [place] posted

## 2023-06-25 NOTE — Progress Notes (Signed)
 Subjective:    Patient ID: Courtney Pineda, female    DOB: 05/15/1970, 53 y.o.   MRN: [id number]  HPI  Wt Readings from Last 3 Encounters:  06/25/23 143 lb (64.9 kg)  06/22/23 141 lb 1.5 oz (64 kg)  06/21/23 141 lb 2 oz (64 kg)   24.55 kg/m  Vitals:   06/25/23 1159  BP: 110/80  Pulse: 60  Temp: 98 F (36.7 C)  SpO2: 97%    Pt presents for follow up of ER visit for chest area pain / atypical   This occurred when sitting down after standing during work  Just hit her out of the blue  Was sharp and constant  ? If any change with breathing  She tried lying down and change position -no change Not tender  Left arm felt a little different   She presented to ER on 3/11 at [facility]  Reported left lower chest pain /below breast for 20 min at 1 pm that day -resolved by time she go to ER Reassuring vitals and EKG (NSR rate of 63 no acute changes)  DG Chest 2 View Result Date: 06/22/2023 CLINICAL DATA:  Chest pain. EXAM: CHEST - 2 VIEW COMPARISON:  05/19/2019. FINDINGS: Bilateral lung fields are clear. Bilateral costophrenic angles are clear. Normal cardio-mediastinal silhouette. No acute osseous abnormalities. The soft tissues are within normal limits. There are surgical clips in the right upper quadrant, typical of a previous cholecystectomy. IMPRESSION: No active cardiopulmonary disease. Electronically Signed   By: Jules Schick M.D.   On: 06/22/2023 18:06    Per ER note BMP and CBC show no acute abnormality. Initial troponin is negative. The blood was drawn about 2 hours after the symptoms started; given this timing and the overall low risk of ACS there is no indication for a repeat. EKG is nonischemic. Chest x-ray is clear. Blood pressure is improving.   Noted that msk etiology was in differential along with GERD or gastritis or neuropathic pain   Labs  Results for orders placed or performed during the hospital encounter of 06/22/23  CBG monitoring, ED   Collection Time:  06/22/23  3:10 PM  Result Value Ref Range   Glucose-Capillary 114 (H) 70 - 99 mg/dL  Basic metabolic panel   Collection Time: 06/22/23  3:13 PM  Result Value Ref Range   Sodium 139 135 - 145 mmol/L   Potassium 3.6 3.5 - 5.1 mmol/L   Chloride 104 98 - 111 mmol/L   CO2 24 22 - 32 mmol/L   Glucose, Bld 107 (H) 70 - 99 mg/dL   BUN 15 6 - 20 mg/dL   Creatinine, Ser 4.09 0.44 - 1.00 mg/dL   Calcium 9.3 8.9 - 81.1 mg/dL   GFR, Estimated >91 >47 mL/min   Anion gap 11 5 - 15  CBC   Collection Time: 06/22/23  3:13 PM  Result Value Ref Range   WBC 8.4 4.0 - 10.5 K/uL   RBC 4.52 3.87 - 5.11 MIL/uL   Hemoglobin 14.0 12.0 - 15.0 g/dL   HCT 82.9 56.2 - 13.0 %   MCV 93.4 80.0 - 100.0 fL   MCH 31.0 26.0 - 34.0 pg   MCHC 33.2 30.0 - 36.0 g/dL   RDW 86.5 78.4 - 69.6 %   Platelets 276 150 - 400 K/uL   nRBC 0.0 0.0 - 0.2 %  Troponin I (High Sensitivity)   Collection Time: 06/22/23  3:13 PM  Result Value Ref Range   Troponin I (  High Sensitivity) 3 <18 ng/L    Pain did not return  Back to normal    Patient Active Problem List   Diagnosis Date Noted   Encounter for screening for HIV 06/21/2023   Encounter for hepatitis C screening test for low risk patient 06/21/2023   Routine general medical examination at a health care facility 06/13/2023   Cerumen impaction 06/19/2022   SUI (stress urinary incontinence, female) 07/07/2021   Encounter for screening mammogram for breast cancer 04/30/2021   Asthma 11/12/2020   DOE (dyspnea on exertion) 07/09/2020   Allergic rhinitis 12/25/2019   Vaccine reaction 07/26/2019   Perimenopause 06/08/2018   Low HDL (under 40) 06/08/2018   [place] intestinal bacterial overgrowth 02/09/2018   Cyst of right kidney 06/04/2017   RUQ abdominal pain 05/31/2017   Elevated glucose level 05/22/2017   S/P laparoscopic hysterectomy 01/14/2017   Cough variant asthma 06/01/2016   Encounter for routine gynecological examination 05/23/2014   Burning sensation of mouth  09/12/2013   Elevated transaminase level 01/13/2011   Encounter for routine adult medical exam with abnormal findings 01/05/2011   KIDNEY STONE 03/27/2010   Atypical chest pain 07/18/2008   STRESS REACTION, ACUTE, WITH EMOTIONAL DISTURBANCE 09/13/2007   Headache 09/13/2007   Past Medical History:  Diagnosis Date   Anemia    Asthma    Chronic right upper quadrant pain    Dysplastic nevus 09/16/2015   left infra axillary   Dysplastic nevus 10/27/2017   left side   History of kidney stones    Hyperemesis gravidarum 1999   [place] intestinal bacterial overgrowth 02/09/2018   Past Surgical History:  Procedure Laterality Date   ABDOMINAL HYSTERECTOMY     CHOLECYSTECTOMY  06/1994   COLONOSCOPY WITH PROPOFOL N/A 07/12/2017   Procedure: COLONOSCOPY WITH PROPOFOL;  Surgeon: Toney Reil, MD;  Location: [facility] ENDOSCOPY;  Service: Gastroenterology;  Laterality: N/A;   CYSTOSCOPY N/A 01/14/2017   Procedure: CYSTOSCOPY;  Surgeon: Vena Austria, MD;  Location: [facility] ORS;  Service: Gynecology;  Laterality: N/A;   ESOPHAGOGASTRODUODENOSCOPY (EGD) WITH PROPOFOL N/A 07/12/2017   Procedure: ESOPHAGOGASTRODUODENOSCOPY (EGD) WITH PROPOFOL;  Surgeon: Toney Reil, MD;  Location: [facility] ENDOSCOPY;  Service: Gastroenterology;  Laterality: N/A;   KIDNEY STONE SURGERY     LITHOTRIPSY  03/2006   TONSILECTOMY, ADENOIDECTOMY, BILATERAL MYRINGOTOMY AND TUBES     myringotomy tubes   TOTAL LAPAROSCOPIC HYSTERECTOMY WITH SALPINGECTOMY Bilateral 01/14/2017   Procedure: HYSTERECTOMY TOTAL LAPAROSCOPIC WITH SALPINGECTOMY;  Surgeon: Vena Austria, MD;  Location: [facility] ORS;  Service: Gynecology;  Laterality: Bilateral;   Social History   Tobacco Use   Smoking status: Never   Smokeless tobacco: Never  Vaping Use   Vaping status: Never Used  Substance Use Topics   Alcohol use: No    Alcohol/week: 0.0 standard drinks of alcohol   Drug use: No   Family History  Problem Relation Age of Onset    Migraines Mother    Cancer Maternal Grandmother 6       brain tumor/breast cancer   Breast cancer Maternal Grandmother    Heart disease Maternal Grandfather        heart problems   Breast cancer Paternal Aunt 34       Contact   Colon cancer Neg Hx    Esophageal cancer Neg Hx    Liver disease Neg Hx    Pancreatic cancer Neg Hx    Stomach cancer Neg Hx    Inflammatory bowel disease Neg Hx    Allergies  Allergen Reactions   Amoxicillin Anaphylaxis   Bee Venom Anaphylaxis   Sulfa Antibiotics Anaphylaxis   Sulfonamide Derivatives Anaphylaxis    Lungs close up   Covid-19 (Mrna) Vaccine     J and J Local reaction    Penicillins Rash    Has patient had a PCN reaction causing immediate rash, facial/tongue/throat swelling, SOB or lightheadedness with hypotension: No Has patient had a PCN reaction causing severe rash involving mucus membranes or skin necrosis: No Has patient had a PCN reaction that required hospitalization: No Has patient had a PCN reaction occurring within the last 10 years: No If all of the above answers are "NO", then may proceed with Cephalosporin use.    Current Outpatient Medications on File Prior to Visit  Medication Sig Dispense Refill   albuterol (VENTOLIN HFA) 108 (90 Base) MCG/ACT inhaler Inhale 2 puffs into the lungs every 6 (six) hours as needed for wheezing or shortness of breath. 8 g 2   Cholecalciferol (D3-1000 PO) Take by mouth.     Fluticasone-Umeclidin-Vilant (TRELEGY ELLIPTA) 100-62.5-25 MCG/ACT AEPB Inhale 1 puff into the lungs daily. 1 each 11   No current facility-administered medications on file prior to visit.    Review of Systems  Constitutional:  Negative for activity change, appetite change, fatigue, fever and unexpected weight change.  HENT:  Negative for congestion, ear pain, rhinorrhea, sinus pressure and sore throat.   Eyes:  Negative for pain, redness and visual disturbance.  Respiratory:  Negative for cough, shortness of breath  and wheezing.   Cardiovascular:  Negative for chest pain, palpitations and leg swelling.       Cp is resolved   Gastrointestinal:  Positive for abdominal pain. Negative for abdominal distention, anal bleeding, blood in stool, constipation, diarrhea, nausea, rectal pain and vomiting.       Baseline RUQ pain    Left chest /upper abd pain is gone   Endocrine: Negative for polydipsia and polyuria.  Genitourinary:  Negative for dysuria, frequency and urgency.  Musculoskeletal:  Negative for arthralgias, back pain and myalgias.  Skin:  Negative for pallor and rash.  Allergic/Immunologic: Negative for environmental allergies.  Neurological:  Negative for dizziness, syncope and headaches.  Hematological:  Negative for adenopathy. Does not bruise/bleed easily.  Psychiatric/Behavioral:  Negative for decreased concentration and dysphoric mood. The patient is not nervous/anxious.        Objective:   Physical Exam Constitutional:      General: She is not in acute distress.    Appearance: Normal appearance. She is well-developed and normal weight. She is not ill-appearing or diaphoretic.  HENT:     Head: Normocephalic and atraumatic.  Eyes:     General: No scleral icterus.    Conjunctiva/sclera: Conjunctivae normal.     Pupils: Pupils are equal, round, and reactive to light.  Cardiovascular:     Rate and Rhythm: Normal rate and regular rhythm.     Heart sounds: Normal heart sounds.  Pulmonary:     Effort: Pulmonary effort is normal. No respiratory distress.     Breath sounds: Normal breath sounds. No wheezing or rales.  Abdominal:     General: Bowel sounds are normal. There is no distension.     Palpations: Abdomen is soft. There is no mass or pulsatile mass.     Tenderness: There is abdominal tenderness in the right upper quadrant and left upper quadrant. There is no right CVA tenderness, left CVA tenderness, guarding or rebound. Negative signs include Murphy's sign and  McBurney's sign.      Hernia: No hernia is present.     Comments: Baseline mild RUQ tenderness  Today some tenderness to deep palp under left ant ribs   Musculoskeletal:     Cervical back: Normal range of motion and neck supple.  Lymphadenopathy:     Cervical: No cervical adenopathy.  Skin:    General: Skin is warm and dry.     Coloration: Skin is not jaundiced or pale.     Findings: No bruising or erythema.  Neurological:     Mental Status: She is alert.     Cranial Nerves: No cranial nerve deficit.     Coordination: Coordination normal.  Psychiatric:        Mood and Affect: Mood normal.           Assessment & Plan:   Problem List Items Addressed This Visit       Other   RUQ abdominal pain   This is fairly stable  Some tenderness today  Seems unrelated to recent brief left lower cp (Reviewed hospital records, lab results and studies in detail )  Will continue to watch diet and avoid triggers Waiting for appointment with [facility]   Call back and Er precautions noted in detail today         Atypical chest pain - Primary   20 minute episode on left side under ribs w/o associated symptoms  Reassuring ER work up on 3/11 Reviewed hospital records, lab results and studies in detail    Now resolved  Reassuring exam/ slightly tenderness in LUQ of abd under ribs  No bony tenderness   Suspect this may have been a spasm /possible diaphragm or chest wall Encouraged warm compress if needed  Encouraged to call if this re occurs Update if not starting to improve in a week or if worsening

## 2023-06-26 ENCOUNTER — Encounter: Payer: Self-pay | Admitting: Pulmonary Disease

## 2023-07-08 NOTE — Progress Notes (Signed)
 PCP: Judy Pimple, MD   Chief Complaint  Patient presents with   Gynecologic Exam    Still having pain during intercourse and hot flashes.    HPI:      [name] is a 53 y.o. [id number] whose LMP was Patient's last menstrual period was 01/08/2017 (exact date)., presents today for her annual examination.  Her menses are absent due to total lap hyst with salpingectomy 2018 with Dr. Bonney Aid for AUB/leio. No PMB. Doing well. She does have terrible vasomotor sx, has not done HRT. Pt being eval for persistent elevated LFTs with GI.   Sex activity: single partner, contraception - status post hysterectomy. She does have vaginal dryness and pain, not using lubricants. Started vag ERT last yr with some relief but then pt having PMB with neg exam, so stopped vag ERT. No PMB since (not sure related but pt s/p hyst). Perineal cyst drained by Dr. 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 Pt did 1 pelvic PT but then got busy. Dyspareunia more inside that tight vaginal band per pt. Pt also with decreased libido and fatigue.   Hx of SUI sx ebb and flow. Not doing pelvic PT.    Last Pap: 11/02/22 Results were: ASCUS /neg HPV DNA. No longer indicated but done 7/24 due to PMB. Repeat today Hx of STDs: none  Last mammogram: 07/09/22  Results were: normal--routine follow-up in 12 months; has appt today There is a FH of breast cancer in her MGM and pat aunt, genetic testing not indicated for pt. There is no FH of ovarian cancer. The patient does do self-breast exams.  Colonoscopy: 2019 with Dr. Allegra Lai; Repeat due after 10 years.   Tobacco use: The patient denies current or previous tobacco use. Alcohol use: none No drug use. Exercise: not active  She does get some adequate calcium and Vitamin D in her diet. Son and sister with hx of osteoporosis. Pt hasn't had DEXA  Labs with PCP.    Patient Active Problem List   Diagnosis Date Noted    Family history of osteoporosis in sister 07/12/2023   Encounter for screening for HIV 06/21/2023   Encounter for hepatitis C screening test for low risk patient 06/21/2023   Routine general medical examination at a health care facility 06/13/2023   Cerumen impaction 06/19/2022   SUI (stress urinary incontinence, female) 07/07/2021   Encounter for screening mammogram for breast cancer 04/30/2021   Asthma 11/12/2020   DOE (dyspnea on exertion) 07/09/2020   Allergic rhinitis 12/25/2019   Vaccine reaction 07/26/2019   Perimenopause 06/08/2018   Low HDL (under 40) 06/08/2018   Goodness intestinal bacterial overgrowth 02/09/2018   Cyst of right kidney 06/04/2017   RUQ abdominal pain 05/31/2017   Elevated glucose level 05/22/2017   S/P laparoscopic hysterectomy 01/14/2017   Cough variant asthma 06/01/2016   Encounter for routine gynecological examination 05/23/2014   Burning sensation of mouth 09/12/2013   Elevated transaminase level 01/13/2011   Encounter for routine adult medical exam with abnormal findings 01/05/2011   KIDNEY STONE 03/27/2010   Atypical chest pain 07/18/2008   STRESS REACTION, ACUTE, WITH EMOTIONAL DISTURBANCE 09/13/2007   Headache 09/13/2007    Past Surgical History:  Procedure Laterality Date   ABDOMINAL HYSTERECTOMY     CHOLECYSTECTOMY  06/1994   COLONOSCOPY WITH PROPOFOL N/A 07/12/2017   Procedure: COLONOSCOPY WITH PROPOFOL;  Surgeon: Toney Reil, MD;  Location: [facility] END[facility];  Service: Gastroenterology;  Laterality: N/A;   CYSTOSCOPY N/A 01/14/2017   Procedure: CYSTOSCOPY;  Surgeon: Vena Austria, MD;  Location: [facility] ORS;  Service: Gynecology;  Laterality: N/A;   ESOPHAGOGASTRODUODENOSCOPY (EGD) WITH PROPOFOL N/A 07/12/2017   Procedure: ESOPHAGOGASTRODUODENOSCOPY (EGD) WITH PROPOFOL;  Surgeon: Toney Reil, MD;  Location: [facility] ENDOSCOPY;  Service: Gastroenterology;  Laterality: N/A;   KIDNEY STONE SURGERY     LITHOTRIPSY  03/2006   TONSILECTOMY,  ADENOIDECTOMY, BILATERAL MYRINGOTOMY AND TUBES     myringotomy tubes   TOTAL LAPAROSCOPIC HYSTERECTOMY WITH SALPINGECTOMY Bilateral 01/14/2017   Procedure: HYSTERECTOMY TOTAL LAPAROSCOPIC WITH SALPINGECTOMY;  Surgeon: Vena Austria, MD;  Location: [facility] ORS;  Service: Gynecology;  Laterality: Bilateral;    Family History  Problem Relation Age of Onset   Migraines Mother    Cancer Maternal Grandmother 68       brain tumor/breast cancer   Breast cancer Maternal Grandmother    Heart disease Maternal Grandfather        heart problems   Breast cancer Paternal Aunt 75       Contact   Colon cancer Neg Hx    Esophageal cancer Neg Hx    Liver disease Neg Hx    Pancreatic cancer Neg Hx    Stomach cancer Neg Hx    Inflammatory bowel disease Neg Hx     Social History   Socioeconomic History   Marital status: Married    Spouse name: Not on file   Number of children: 2   Years of education: Not on file   Highest education level: Not on file  Occupational History   Occupation: insurance    Employer: [employer]  Tobacco Use   Smoking status: Never   Smokeless tobacco: Never  Vaping Use   Vaping status: Never Used  Substance and Sexual Activity   Alcohol use: No    Alcohol/week: 0.0 standard drinks of alcohol   Drug use: No   Sexual activity: Yes    Birth control/protection: Surgical    Comment: Hysterectomy  Other Topics Concern   Not on file  Social History Narrative   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, she is an Marine scientist   Rare caffeine and never smoker no alcohol no drug or substance use   Social Drivers of Corporate investment banker Strain: Low Risk  (03/23/2019)   Received from [facility], [facility]   Overall Financial Resource Strain (CARDIA)    Difficulty of Paying Living Expenses: Not hard at all  Food Insecurity: Not on file  Transportation Needs: No Transportation Needs (03/23/2019)   Received from [facility], [facility]   [facility] - Transportation    Lack of Transportation (Medical): No    Lack of Transportation (Non-Medical): No  Physical Activity: Insufficiently Active (03/02/2017)   Exercise Vital Sign    Days of Exercise per Week: 3 days    Minutes of Exercise per Session: 30 min  Stress: No Stress Concern Present (03/02/2017)   [employer] of Occupational Health - Occupational Stress Questionnaire    Feeling of Stress : Only a little  Social Connections: Moderately Integrated (03/02/2017)   Social Connection and Isolation Panel [NHANES]    Frequency of Communication with Friends and Family: More than three times a week    Frequency of Social Gatherings with Friends and Family: More than three times a week    Attends Religious Services: More than 4 times per year  Active Member of Clubs or Organizations: No    Attends Banker Meetings: Never    Marital Status: Married  Catering manager Violence: Not At Risk (03/02/2017)   Humiliation, Afraid, Rape, and Kick questionnaire    Fear of Current or Ex-Partner: No    Emotionally Abused: No    Physically Abused: No    Sexually Abused: No     Current Outpatient Medications:    albuterol (VENTOLIN HFA) 108 (90 Base) MCG/ACT inhaler, Inhale 2 puffs into the lungs every 6 (six) hours as needed for wheezing or shortness of breath., Disp: 8 g, Rfl: 2   Cholecalciferol (D3-1000 PO), Take by mouth., Disp: , Rfl:    Fluticasone-Umeclidin-Vilant (TRELEGY ELLIPTA) 100-62.5-25 MCG/ACT AEPB, Inhale 1 puff into the lungs daily., Disp: 1 each, Rfl: 11   estradiol (ESTRACE VAGINAL) 0.1 MG/GM vaginal cream, Insert 1 g vaginally nightly for 7 nights, then 1-2 times weekly as maintenance., Disp: 42.5 g, Rfl: 1     ROS:  Review of Systems  Constitutional:  Negative for fatigue, fever and unexpected weight change.  Respiratory:  Negative for cough, shortness of breath and wheezing.   Cardiovascular:  Negative for  chest pain, palpitations and leg swelling.  Gastrointestinal:  Negative for blood in stool, constipation, diarrhea, nausea and vomiting.  Endocrine: Negative for cold intolerance, heat intolerance and polyuria.  Genitourinary:  Positive for dyspareunia. Negative for dysuria, flank pain, frequency, genital sores, hematuria, menstrual problem, pelvic pain, urgency, vaginal bleeding, vaginal discharge and vaginal pain.  Musculoskeletal:  Negative for back pain, joint swelling and myalgias.  Skin:  Negative for rash.  Neurological:  Negative for dizziness, syncope, light-headedness, numbness and headaches.  Hematological:  Negative for adenopathy.  Psychiatric/Behavioral:  Negative for agitation, confusion, sleep disturbance and suicidal ideas. The patient is not nervous/anxious.    BREAST: No symptoms    Objective: BP 121/66   Pulse 64   Ht 5\' 4"  (1.626 m)   Wt 146 lb (66.2 kg)   LMP 01/08/2017 (Exact Date)   BMI 25.06 kg/m    Physical Exam Constitutional:      Appearance: She is well-developed.  Genitourinary:     Vulva normal.     Genitourinary Comments: UTERUS/CX SURG REM     Right Labia: No rash, tenderness or lesions.    Left Labia: No tenderness, lesions or rash.    Vaginal cuff intact.    No vaginal discharge, erythema or tenderness.     Mild vaginal atrophy present.     Right Adnexa: not tender and no mass present.    Left Adnexa: not tender and no mass present.    Cervix is absent.     Uterus is absent.  Breasts:    Right: No mass, nipple discharge, skin change or tenderness.     Left: No mass, nipple discharge, skin change or tenderness.  Neck:     Thyroid: No thyromegaly.  Cardiovascular:     Rate and Rhythm: Normal rate and regular rhythm.     Heart sounds: Normal heart sounds. No murmur heard. Pulmonary:     Effort: Pulmonary effort is normal.     Breath sounds: Normal breath sounds.  Abdominal:     Palpations: Abdomen is soft.     Tenderness: There is  no abdominal tenderness. There is no guarding.  Musculoskeletal:        General: Normal range of motion.     Cervical back: Normal range of motion.  Neurological:  General: No focal deficit present.     Mental Status: She is alert and oriented to person, place, and time.     Cranial Nerves: No cranial nerve deficit.  Skin:    General: Skin is warm and dry.  Psychiatric:        Mood and Affect: Mood normal.        Behavior: Behavior normal.        Thought Content: Thought content normal.        Judgment: Judgment normal.  Vitals reviewed.     Assessment/Plan:  Encounter for annual routine gynecological examination  Screening for vaginal cancer - Plan: Cytology - PAP  Screening for HPV (human papillomavirus) - Plan: Cytology - PAP  Atypical squamous cell changes of undetermined significance (ASCUS) on vaginal cytology - Plan: Cytology - PAP; repeat pap today. Will f/u if abn.   Encounter for screening mammogram for malignant neoplasm of breast; awaiting results form today  Vasomotor symptoms due to menopause--discussed HRT vs veozah. Pt to f/u after GI eval for elevated LFTs to start Rx.   Dyspareunia in female - Plan: estradiol (ESTRACE VAGINAL) 0.1 MG/GM vaginal cream; resume vag ERT. Rx RF eRxd, need to use lubricants as well  SUI (stress urinary incontinence, female)--try pelvic floor exercises online, can go back to pelvic PT prn.   Screening for osteoporosis - Plan: DG Bone Density; pt to schedule DEXA  Family history of osteoporosis in sister - Plan: DG Bone Density   Meds ordered this encounter  Medications   estradiol (ESTRACE VAGINAL) 0.1 MG/GM vaginal cream    Sig: Insert 1 g vaginally nightly for 7 nights, then 1-2 times weekly as maintenance.    Dispense:  42.5 g    Refill:  1    Supervising Provider:   Waymon Budge            GYN counsel breast self exam, mammography screening, menopause, adequate intake of calcium and vitamin D, diet and  exercise    F/U  Return in about 1 year (around 07/11/2024).  Sheilia Reznick B. Dayra Rapley, PA-C 07/12/2023 5:17 PM

## 2023-07-12 ENCOUNTER — Encounter: Payer: Self-pay | Admitting: Obstetrics and Gynecology

## 2023-07-12 ENCOUNTER — Telehealth: Payer: Self-pay | Admitting: Gastroenterology

## 2023-07-12 ENCOUNTER — Ambulatory Visit (INDEPENDENT_AMBULATORY_CARE_PROVIDER_SITE_OTHER): Payer: No Typology Code available for payment source | Admitting: Obstetrics and Gynecology

## 2023-07-12 ENCOUNTER — Ambulatory Visit
Admission: RE | Admit: 2023-07-12 | Discharge: 2023-07-12 | Disposition: A | Discharge status: Home / Self Care | Payer: No Typology Code available for payment source | Source: Ambulatory Visit | Attending: Family Medicine | Admitting: Family Medicine

## 2023-07-12 ENCOUNTER — Other Ambulatory Visit (HOSPITAL_COMMUNITY)
Admission: RE | Admit: 2023-07-12 | Discharge: 2023-07-12 | Disposition: A | Discharge status: Home / Self Care | Source: Ambulatory Visit | Attending: Obstetrics and Gynecology | Admitting: Obstetrics and Gynecology

## 2023-07-12 VITALS — BP 121/66 | HR 64 | Ht 64.0 in | Wt 146.0 lb

## 2023-07-12 DIAGNOSIS — Z1231 Encounter for screening mammogram for malignant neoplasm of breast: Secondary | ICD-10-CM

## 2023-07-12 DIAGNOSIS — Z124 Encounter for screening for malignant neoplasm of cervix: Secondary | ICD-10-CM | POA: Diagnosis present

## 2023-07-12 DIAGNOSIS — N951 Menopausal and female climacteric states: Secondary | ICD-10-CM

## 2023-07-12 DIAGNOSIS — N941 Unspecified dyspareunia: Secondary | ICD-10-CM

## 2023-07-12 DIAGNOSIS — N9089 Other specified noninflammatory disorders of vulva and perineum: Secondary | ICD-10-CM

## 2023-07-12 DIAGNOSIS — Z13820 Encounter for screening for osteoporosis: Secondary | ICD-10-CM

## 2023-07-12 DIAGNOSIS — Z1151 Encounter for screening for human papillomavirus (HPV): Secondary | ICD-10-CM | POA: Diagnosis present

## 2023-07-12 DIAGNOSIS — R87620 Atypical squamous cells of undetermined significance on cytologic smear of vagina (ASC-US): Secondary | ICD-10-CM

## 2023-07-12 DIAGNOSIS — R87610 Atypical squamous cells of undetermined significance on cytologic smear of cervix (ASC-US): Secondary | ICD-10-CM | POA: Insufficient documentation

## 2023-07-12 DIAGNOSIS — Z01419 Encounter for gynecological examination (general) (routine) without abnormal findings: Secondary | ICD-10-CM | POA: Diagnosis not present

## 2023-07-12 DIAGNOSIS — Z1272 Encounter for screening for malignant neoplasm of vagina: Secondary | ICD-10-CM

## 2023-07-12 DIAGNOSIS — N393 Stress incontinence (female) (male): Secondary | ICD-10-CM

## 2023-07-12 DIAGNOSIS — Z8262 Family history of osteoporosis: Secondary | ICD-10-CM | POA: Insufficient documentation

## 2023-07-12 DIAGNOSIS — N950 Postmenopausal bleeding: Secondary | ICD-10-CM

## 2023-07-12 MED ORDER — ESTRADIOL 0.1 MG/GM VA CREA: TOPICAL_CREAM | VAGINAL | 1 refills | Status: DC

## 2023-07-12 NOTE — Patient Instructions (Addendum)
 I value your feedback and you entrusting [place] with your care. If you get a [facility] patient survey, I would appreciate you taking the time to let us know about your experience today. Thank you!  [facility] ([place])--[phone]

## 2023-07-12 NOTE — Telephone Encounter (Signed)
 The patient called to discuss elevated liver enzymes and right upper abdomen pain with Dr. Tobi Bastos. I informed her that we currently do not have available appointments for new patients. I offered to add her to our call-back list and assured her that we will reach out as soon as an appointment becomes available. Additionally, I informed the patient that I would send a message to Dr. Tobi Bastos to inquire about any further recommendations. I will contact the patient once I receive guidance from Dr. Tobi Bastos.

## 2023-07-12 NOTE — Telephone Encounter (Signed)
 The patient called in to inquire if there was any possibility of seeing her after 07/27/2023. She mentioned that she came into the office.

## 2023-07-13 NOTE — Telephone Encounter (Signed)
 Okay, I will call her to let her know.

## 2023-07-14 ENCOUNTER — Encounter: Payer: Self-pay | Admitting: Physician Assistant

## 2023-07-14 ENCOUNTER — Encounter: Payer: Self-pay | Admitting: Family Medicine

## 2023-07-14 NOTE — Telephone Encounter (Signed)
 I called the patient to inform her that it is best for her to keep her [facility] appointment on 09/15/2023, as our providers are unable to see her after 07/27/2023. The patient understood.

## 2023-07-15 ENCOUNTER — Ambulatory Visit
Admission: RE | Admit: 2023-07-15 | Discharge: 2023-07-15 | Disposition: A | Discharge status: Home / Self Care | Source: Ambulatory Visit | Attending: Obstetrics and Gynecology | Admitting: Obstetrics and Gynecology

## 2023-07-15 ENCOUNTER — Encounter: Payer: Self-pay | Admitting: Obstetrics and Gynecology

## 2023-07-15 DIAGNOSIS — Z13820 Encounter for screening for osteoporosis: Secondary | ICD-10-CM | POA: Insufficient documentation

## 2023-07-15 DIAGNOSIS — Z8262 Family history of osteoporosis: Secondary | ICD-10-CM | POA: Diagnosis present

## 2023-07-15 LAB — CYTOLOGY - PAP
Adequacy: ABSENT
Comment: NEGATIVE
Diagnosis: NEGATIVE
Diagnosis: REACTIVE
High risk HPV: NEGATIVE

## 2023-07-16 ENCOUNTER — Encounter: Payer: Self-pay | Admitting: Family Medicine

## 2023-07-23 ENCOUNTER — Encounter: Payer: Self-pay | Admitting: Family Medicine

## 2023-07-23 ENCOUNTER — Ambulatory Visit: Admitting: Family Medicine

## 2023-07-23 VITALS — BP 98/64 | HR 60 | Temp 98.4°F | Ht 64.0 in | Wt 145.2 lb

## 2023-07-23 DIAGNOSIS — R300 Dysuria: Secondary | ICD-10-CM

## 2023-07-23 DIAGNOSIS — N951 Menopausal and female climacteric states: Secondary | ICD-10-CM

## 2023-07-23 DIAGNOSIS — R202 Paresthesia of skin: Secondary | ICD-10-CM

## 2023-07-23 DIAGNOSIS — R109 Unspecified abdominal pain: Secondary | ICD-10-CM

## 2023-07-23 DIAGNOSIS — G4485 Primary stabbing headache: Secondary | ICD-10-CM

## 2023-07-23 DIAGNOSIS — R10A2 Flank pain, left side: Secondary | ICD-10-CM | POA: Insufficient documentation

## 2023-07-23 DIAGNOSIS — J301 Allergic rhinitis due to pollen: Secondary | ICD-10-CM

## 2023-07-23 DIAGNOSIS — G8929 Other chronic pain: Secondary | ICD-10-CM

## 2023-07-23 DIAGNOSIS — R200 Anesthesia of skin: Secondary | ICD-10-CM

## 2023-07-23 LAB — POC URINALSYSI DIPSTICK (AUTOMATED)
Bilirubin, UA: NEGATIVE
Blood, UA: NEGATIVE
Glucose, UA: NEGATIVE
Ketones, UA: NEGATIVE
Leukocytes, UA: NEGATIVE
Nitrite, UA: NEGATIVE
Protein, UA: NEGATIVE
Spec Grav, UA: 1.015 (ref 1.010–1.025)
Urobilinogen, UA: 0.2 U/dL
pH, UA: 6 (ref 5.0–8.0)

## 2023-07-23 MED ORDER — GABAPENTIN 100 MG PO CAPS: 100.0000 mg | ORAL_CAPSULE | Freq: Every day | ORAL | 1 refills | Status: DC

## 2023-07-23 NOTE — Assessment & Plan Note (Signed)
 Last week  Now urinalysis clear  Suspect she passed kidney stone   Instructed to call if symptoms return ER if severe

## 2023-07-23 NOTE — Assessment & Plan Note (Addendum)
 2 types   Frontal: frequent/dull/ day time /no migraine symptoms   Posterior : wakes her up at night / can be severe and lasting 15 minutes , feels fatigued afterwards   Also experiencing tingling in left hand - doubt related   Reassuring exam today  Given unusual features of 2nd headache- CT ordered    Will try gabapentin for hot flashes and sleep -may help headaches also   Pending rad report  Call back and Er precautions noted in detail today

## 2023-07-23 NOTE — Assessment & Plan Note (Signed)
 Told pt to change zyrtec 10 mg dosing to pm in light of sedation  This may also help sleep

## 2023-07-23 NOTE — Assessment & Plan Note (Signed)
 Pt had dysuria and left flank pain earlier in the week  Normal urinalysis now and feeling better Suspect she may have passed kidney stone  Instructed to call if symptoms return and continue good hydratoin

## 2023-07-23 NOTE — Assessment & Plan Note (Signed)
 Worsening  Gyn hesitant to start HRT until liver tests are further evaluated (in June at gi)   Will try gabapentin 100 mg for sleep at bedtime and see if this helps  If so- can titrate the dose up   Lack of sleep is no doubt adding to fatigue

## 2023-07-23 NOTE — Assessment & Plan Note (Signed)
 Positive tinel sign on left  Is left handed  Doubt related to headaches at this point  Discussed possible carpal tunnel   Will try wrist splint at night and report back

## 2023-07-23 NOTE — Progress Notes (Signed)
 Subjective:    Patient ID: Courtney Pineda, female    DOB: 1970/04/27, 53 y.o.   MRN: [id number]  HPI  Wt Readings from Last 3 Encounters:  07/23/23 145 lb 4 oz (65.9 kg)  07/12/23 146 lb (66.2 kg)  06/25/23 143 lb (64.9 kg)   24.93 kg/m  Vitals:   07/23/23 1528  BP: 98/64  Pulse: 60  Temp: 98.4 F (36.9 C)  SpO2: 97%   Pt presents for follow up of  Urinary symptoms with low grade temp / improved  Headache Hand numbness  Fatigue Menopause symptoms   Sunday - had fever 99.1 (low grade) in evening  Then back hurt / middle to the left (took ibuprofen and ithelped)  Improved Monday , then back pain returned  Then Tuesday- pressure in low abdomen both sides  Burning to urinate since Monday / then odor in urine   Today - little back pain   Urinalysis is clear today     Headache : 2 types   Frontal , bilateral  Lasting 2 hours  About twice per week  Dull /constant pain  Not throbbing  Nothing helps Trying to avoid over the counter medicines  Does not drink caffeine except for occasional sweet tea  No nausea  No sensitivity to light or sound  Happen during the day  Some tight neck muscles  No other neuro symptoms except hand tingling  Did get new glasses - did not improve headaches   Tries to keep up with fluids Drinks water   2nd type of headache  Posterior  Very sharp pain  Always in the middle of the night   (usually later in night towards am) Wakes her up  Can last up to 15 minutes  Does not think she snores very much    Left arm and hand have episodes of tingling  Hand and arm  During driving or sitting still  Lasts 5 minutes or more  No pain   Is left handed Uses mouse with right hand   Right wrist hurts a bit     Is tired all day every day  Not sleeping well  Thinks menopause related  Lot of hot flashes  / night sweats    Waiting on HRT until liver issue is re checked  Still has chronic RUQ pain     Some sharp pain  in back of head     Patient Active Problem List   Diagnosis Date Noted   Symptoms, such as flushing, sleeplessness, headache, lack of concentration, associated with the menopause 07/23/2023   Dysuria 07/23/2023   Left flank pain 07/23/2023   Numbness and tingling in left hand 07/23/2023   Family history of osteoporosis in sister 07/12/2023   Encounter for screening for HIV 06/21/2023   Encounter for hepatitis C screening test for low risk patient 06/21/2023   Routine general medical examination at a health care facility 06/13/2023   Cerumen impaction 06/19/2022   SUI (stress urinary incontinence, female) 07/07/2021   Encounter for screening mammogram for breast cancer 04/30/2021   Asthma 11/12/2020   DOE (dyspnea on exertion) 07/09/2020   Allergic rhinitis 12/25/2019   Vaccine reaction 07/26/2019   Perimenopause 06/08/2018   Low HDL (under 40) 06/08/2018   [place] intestinal bacterial overgrowth 02/09/2018   Cyst of right kidney 06/04/2017   RUQ abdominal pain 05/31/2017   Elevated glucose level 05/22/2017   S/P laparoscopic hysterectomy 01/14/2017   Cough variant asthma 06/01/2016   Encounter for  routine gynecological examination 05/23/2014   Burning sensation of mouth 09/12/2013   Elevated transaminase level 01/13/2011   Encounter for routine adult medical exam with abnormal findings 01/05/2011   KIDNEY STONE 03/27/2010   Atypical chest pain 07/18/2008   STRESS REACTION, ACUTE, WITH EMOTIONAL DISTURBANCE 09/13/2007   Headache 09/13/2007   Past Medical History:  Diagnosis Date   Anemia    Asthma    Chronic right upper quadrant pain    Dysplastic nevus 09/16/2015   left infra axillary   Dysplastic nevus 10/27/2017   left side   History of kidney stones    Hyperemesis gravidarum 1999   [place] intestinal bacterial overgrowth 02/09/2018   Past Surgical History:  Procedure Laterality Date   ABDOMINAL HYSTERECTOMY     CHOLECYSTECTOMY  06/1994   COLONOSCOPY WITH PROPOFOL  N/A 07/12/2017   Procedure: COLONOSCOPY WITH PROPOFOL;  Surgeon: Toney Reil, MD;  Location: [facility] ENDOSCOPY;  Service: Gastroenterology;  Laterality: N/A;   CYSTOSCOPY N/A 01/14/2017   Procedure: CYSTOSCOPY;  Surgeon: Vena Austria, MD;  Location: [facility] ORS;  Service: Gynecology;  Laterality: N/A;   ESOPHAGOGASTRODUODENOSCOPY (EGD) WITH PROPOFOL N/A 07/12/2017   Procedure: ESOPHAGOGASTRODUODENOSCOPY (EGD) WITH PROPOFOL;  Surgeon: Toney Reil, MD;  Location: [facility] ENDOSCOPY;  Service: Gastroenterology;  Laterality: N/A;   KIDNEY STONE SURGERY     LITHOTRIPSY  03/2006   TONSILECTOMY, ADENOIDECTOMY, BILATERAL MYRINGOTOMY AND TUBES     myringotomy tubes   TOTAL LAPAROSCOPIC HYSTERECTOMY WITH SALPINGECTOMY Bilateral 01/14/2017   Procedure: HYSTERECTOMY TOTAL LAPAROSCOPIC WITH SALPINGECTOMY;  Surgeon: Vena Austria, MD;  Location: [facility] ORS;  Service: Gynecology;  Laterality: Bilateral;   Social History   Tobacco Use   Smoking status: Never   Smokeless tobacco: Never  Vaping Use   Vaping status: Never Used  Substance Use Topics   Alcohol use: No    Alcohol/week: 0.0 standard drinks of alcohol   Drug use: No   Family History  Problem Relation Age of Onset   Migraines Mother    Cancer Maternal Grandmother 52       brain tumor/breast cancer   Breast cancer Maternal Grandmother    Heart disease Maternal Grandfather        heart problems   Breast cancer Paternal Aunt 74       Contact   Colon cancer Neg Hx    Esophageal cancer Neg Hx    Liver disease Neg Hx    Pancreatic cancer Neg Hx    Stomach cancer Neg Hx    Inflammatory bowel disease Neg Hx    Allergies  Allergen Reactions   Amoxicillin Anaphylaxis   Bee Venom Anaphylaxis   Sulfa Antibiotics Anaphylaxis   Sulfonamide Derivatives Anaphylaxis    Lungs close up   Covid-19 (Mrna) Vaccine     J and J Local reaction    Penicillins Rash    Has patient had a PCN reaction causing immediate rash,  facial/tongue/throat swelling, SOB or lightheadedness with hypotension: No Has patient had a PCN reaction causing severe rash involving mucus membranes or skin necrosis: No Has patient had a PCN reaction that required hospitalization: No Has patient had a PCN reaction occurring within the last 10 years: No If all of the above answers are "NO", then may proceed with Cephalosporin use.    Current Outpatient Medications on File Prior to Visit  Medication Sig Dispense Refill   albuterol (VENTOLIN HFA) 108 (90 Base) MCG/ACT inhaler Inhale 2 puffs into the lungs every 6 (six) hours as needed for  wheezing or shortness of breath. 8 g 2   Cholecalciferol (D3-1000 PO) Take by mouth.     estradiol (ESTRACE VAGINAL) 0.1 MG/GM vaginal cream Insert 1 g vaginally nightly for 7 nights, then 1-2 times weekly as maintenance. 42.5 g 1   Fluticasone-Umeclidin-Vilant (TRELEGY ELLIPTA) 100-62.5-25 MCG/ACT AEPB Inhale 1 puff into the lungs daily. 1 each 11   Probiotic Product (PROBIOTIC PEARLS WOMENS PO) Take 1 capsule by mouth daily.     No current facility-administered medications on file prior to visit.    Review of Systems  Constitutional:  Positive for fatigue. Negative for activity change, appetite change, fever and unexpected weight change.  HENT:  Positive for postnasal drip and rhinorrhea. Negative for congestion, ear pain, sinus pressure and sore throat.   Eyes:  Negative for pain, redness and visual disturbance.  Respiratory:  Negative for cough, shortness of breath and wheezing.   Cardiovascular:  Negative for chest pain and palpitations.  Gastrointestinal:  Positive for abdominal pain. Negative for abdominal distention, blood in stool, constipation and diarrhea.       Baseline chronic RUQ abd pain   Endocrine: Negative for polydipsia and polyuria.  Genitourinary:  Positive for dysuria and frequency. Negative for urgency.  Musculoskeletal:  Positive for arthralgias and back pain. Negative for  myalgias.  Skin:  Negative for pallor and rash.  Allergic/Immunologic: Negative for environmental allergies.  Neurological:  Positive for numbness and headaches. Negative for dizziness, tremors, seizures, syncope, facial asymmetry, speech difficulty, weakness and light-headedness.  Hematological:  Negative for adenopathy. Does not bruise/bleed easily.  Psychiatric/Behavioral:  Positive for sleep disturbance. Negative for decreased concentration and dysphoric mood. The patient is nervous/anxious.        Objective:   Physical Exam Constitutional:      General: She is not in acute distress.    Appearance: Normal appearance. She is well-developed and normal weight. She is not ill-appearing or diaphoretic.  HENT:     Head: Normocephalic and atraumatic.     Right Ear: External ear normal.     Left Ear: External ear normal.     Nose: Nose normal.     Mouth/Throat:     Mouth: Mucous membranes are moist.     Pharynx: No oropharyngeal exudate.  Eyes:     General: No scleral icterus.       Right eye: No discharge.        Left eye: No discharge.     Conjunctiva/sclera: Conjunctivae normal.     Pupils: Pupils are equal, round, and reactive to light.     Comments: No nystagmus  Neck:     Thyroid: No thyromegaly.     Vascular: No carotid bruit or JVD.     Trachea: No tracheal deviation.  Cardiovascular:     Rate and Rhythm: Normal rate and regular rhythm.     Heart sounds: Normal heart sounds. No murmur heard.    No gallop.  Pulmonary:     Effort: Pulmonary effort is normal. No respiratory distress.     Breath sounds: Normal breath sounds. No stridor. No wheezing, rhonchi or rales.  Abdominal:     General: Abdomen is flat. Bowel sounds are normal. There is no distension or abdominal bruit.     Palpations: Abdomen is soft. There is no hepatomegaly, splenomegaly, mass or pulsatile mass.     Tenderness: There is no abdominal tenderness. There is no guarding or rebound.     Comments: No  suprapubic tenderness or fullness  Mild left  cva tenderness   Baseline mild RuQ tenderness   Musculoskeletal:        General: No tenderness.     Cervical back: Full passive range of motion without pain, normal range of motion and neck supple.     Right lower leg: No edema.     Left lower leg: No edema.  Lymphadenopathy:     Cervical: No cervical adenopathy.  Skin:    General: Skin is warm and dry.     Coloration: Skin is not pale.     Findings: No rash.  Neurological:     Mental Status: She is alert and oriented to person, place, and time.     Cranial Nerves: No cranial nerve deficit, dysarthria or facial asymmetry.     Sensory: No sensory deficit.     Motor: No weakness, tremor, atrophy, abnormal muscle tone, seizure activity or pronator drift.     Coordination: Romberg sign negative. Coordination normal.     Gait: Gait and tandem walk normal.     Deep Tendon Reflexes: Reflexes are normal and symmetric. Reflexes normal.     Comments: No focal cerebellar signs   Psychiatric:        Mood and Affect: Mood normal.        Behavior: Behavior normal.        Thought Content: Thought content normal.           Assessment & Plan:   Problem List Items Addressed This Visit       Respiratory   Allergic rhinitis   Told pt to change zyrtec 10 mg dosing to pm in light of sedation  This may also help sleep         Other   Symptoms, such as flushing, sleeplessness, headache, lack of concentration, associated with the menopause   Worsening  Gyn hesitant to start HRT until liver tests are further evaluated (in June at gi)   Will try gabapentin 100 mg for sleep at bedtime and see if this helps  If so- can titrate the dose up   Lack of sleep is no doubt adding to fatigue       Numbness and tingling in left hand   Positive tinel sign on left  Is left handed  Doubt related to headaches at this point  Discussed possible carpal tunnel   Will try wrist splint at night and report  back       Left flank pain   Last week  Now urinalysis clear  Suspect she passed kidney stone   Instructed to call if symptoms return ER if severe       Relevant Orders   POCT Urinalysis Dipstick (Automated) (Completed)   Headache - Primary   2 types   Frontal: frequent/dull/ day time /no migraine symptoms   Posterior : wakes her up at night / can be severe and lasting 15 minutes , feels fatigued afterwards   Also experiencing tingling in left hand - doubt related   Reassuring exam today  Given unusual features of 2nd headache- CT ordered    Will try gabapentin for hot flashes and sleep -may help headaches also   Pending rad report  Call back and Er precautions noted in detail today         Relevant Medications   gabapentin (NEURONTIN) 100 MG capsule   Other Relevant Orders   CT HEAD WO CONTRAST ( )   Dysuria   Pt had dysuria and left flank pain earlier in the  week  Normal urinalysis now and feeling better Suspect she may have passed kidney stone  Instructed to call if symptoms return and continue good hydratoin       Relevant Orders   POCT Urinalysis Dipstick (Automated) (Completed)

## 2023-07-23 NOTE — Patient Instructions (Addendum)
 Aim for 60 or more oz of fluid per day   Change zyrtec dosing to pm/ evening -this may help sleep as well as the allergies and cause less day time sedation   Try gabapentin 100 mg daily at bedtime  This is for hot flashes/sweats/sleep  We can go up on the dose later as you get used to it (caution of sedation)  I want to order a CT of your head for the headaches  We will have to see if insurance will cover this  If you don't get a call to schedule this in the next week let me know  Get a wrist splint over the counter (for carpal tunnel) for left hand and wear it during sleep   Urine is clear today -no signs of infection  You may have passed a stone  Let us know if symptoms return

## 2023-07-30 ENCOUNTER — Ambulatory Visit
Admission: RE | Admit: 2023-07-30 | Discharge: 2023-07-30 | Disposition: A | Discharge status: Home / Self Care | Source: Ambulatory Visit | Attending: Family Medicine | Admitting: Family Medicine

## 2023-07-30 ENCOUNTER — Inpatient Hospital Stay: Admission: RE | Admit: 2023-07-30 | Source: Ambulatory Visit

## 2023-07-30 DIAGNOSIS — G4485 Primary stabbing headache: Secondary | ICD-10-CM

## 2023-08-26 ENCOUNTER — Ambulatory Visit: Payer: Self-pay | Admitting: Family Medicine

## 2023-08-30 NOTE — Addendum Note (Signed)
 Addended by: Deri Fleet A on: 08/30/2023 09:14 PM   Modules accepted: Orders

## 2023-09-02 ENCOUNTER — Encounter: Payer: Self-pay | Admitting: Obstetrics and Gynecology

## 2023-09-02 ENCOUNTER — Ambulatory Visit: Admitting: Obstetrics and Gynecology

## 2023-09-02 VITALS — BP 117/74 | HR 58 | Ht 64.0 in | Wt 142.0 lb

## 2023-09-02 DIAGNOSIS — N898 Other specified noninflammatory disorders of vagina: Secondary | ICD-10-CM | POA: Diagnosis not present

## 2023-09-02 DIAGNOSIS — R102 Pelvic and perineal pain: Secondary | ICD-10-CM

## 2023-09-02 LAB — POCT URINALYSIS DIPSTICK
Bilirubin, UA: NEGATIVE
Glucose, UA: NEGATIVE
Ketones, UA: NEGATIVE
Nitrite, UA: NEGATIVE
Protein, UA: NEGATIVE
Spec Grav, UA: 1.01 (ref 1.010–1.025)
pH, UA: 7 (ref 5.0–8.0)

## 2023-09-02 NOTE — Patient Instructions (Signed)
 I value your feedback and you entrusting [place] with your care. If you get a [place] patient survey, I would appreciate you taking the time to let us know about your experience today. Thank you! ? ? ?

## 2023-09-02 NOTE — Progress Notes (Signed)
 Tower, Manley Seeds, MD   Chief Complaint  Patient presents with   Vaginal Exam    Ext lump in vaginal area, tender.   Urinary Tract Infection    Pressure when urinating, no frequency or burning.    HPI:      [name] is a 53 y.o. [id number] whose LMP was Patient's last menstrual period was 01/08/2017 (exact date)., presents today for pelvic pressure with voiding for the past month; no dysuria, frequency/urgency/hematuria. Was having LT kidney pain initially with sx (hx of kidney stones) that resolved. Had neg UA with PCP 07/23/23. Drinks lots of water, rare caffeine use. 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 Sometimes using vag ERT given to her at 3/25 annual. Pt is s/p hyst; no PMB. Is having issues with chronic constipation, sometimes taking probiotics, not doing fiber supp.   Pt noticed RT labial lump a few wks ago, thinks it's getting smaller. No frank d/c but noticed a little blood on pantyliner a few days ago; no pain. Area was tender, now itches a little bit. No other vag d/c/irritation sx. No recent shaving.   Patient Active Problem List   Diagnosis Date Noted   Symptoms, such as flushing, sleeplessness, headache, lack of concentration, associated with the menopause 07/23/2023   Dysuria 07/23/2023   Left flank pain 07/23/2023   Numbness and tingling in left hand 07/23/2023   Family history of osteoporosis in sister 07/12/2023   Encounter for screening for HIV 06/21/2023   Encounter for hepatitis C screening test for low risk patient 06/21/2023   Routine general medical examination at a health care facility 06/13/2023   Cerumen impaction 06/19/2022   SUI (stress urinary incontinence, female) 07/07/2021   Encounter for screening mammogram for breast cancer 04/30/2021   Asthma 11/12/2020   DOE (dyspnea on exertion) 07/09/2020   Allergic rhinitis 12/25/2019   Vaccine reaction 07/26/2019   Perimenopause 06/08/2018   Low HDL (under 40) 06/08/2018   [place] intestinal  bacterial overgrowth 02/09/2018   Cyst of right kidney 06/04/2017   RUQ abdominal pain 05/31/2017   Elevated glucose level 05/22/2017   S/P laparoscopic hysterectomy 01/14/2017   Cough variant asthma 06/01/2016   Encounter for routine gynecological examination 05/23/2014   Burning sensation of mouth 09/12/2013   Elevated transaminase level 01/13/2011   Encounter for routine adult medical exam with abnormal findings 01/05/2011   KIDNEY STONE 03/27/2010   Atypical chest pain 07/18/2008   STRESS REACTION, ACUTE, WITH EMOTIONAL DISTURBANCE 09/13/2007   Headache 09/13/2007    Past Surgical History:  Procedure Laterality Date   ABDOMINAL HYSTERECTOMY     CHOLECYSTECTOMY  06/1994   COLONOSCOPY WITH PROPOFOL  N/A 07/12/2017   Procedure: COLONOSCOPY WITH PROPOFOL ;  Surgeon: Selena Daily, MD;  Location: [facility] ENDOSCOPY;  Service: Gastroenterology;  Laterality: N/A;   CYSTOSCOPY N/A 01/14/2017   Procedure: CYSTOSCOPY;  Surgeon: Darl Edu, MD;  Location: [facility] ORS;  Service: Gynecology;  Laterality: N/A;   ESOPHAGOGASTRODUODENOSCOPY (EGD) WITH PROPOFOL  N/A 07/12/2017   Procedure: ESOPHAGOGASTRODUODENOSCOPY (EGD) WITH PROPOFOL ;  Surgeon: Selena Daily, MD;  Location: [facility] ENDOSCOPY;  Service: Gastroenterology;  Laterality: N/A;   KIDNEY STONE SURGERY     LITHOTRIPSY  03/2006   TONSILECTOMY, ADENOIDECTOMY, BILATERAL MYRINGOTOMY AND TUBES     myringotomy tubes   TOTAL LAPAROSCOPIC HYSTERECTOMY WITH SALPINGECTOMY Bilateral 01/14/2017   Procedure: HYSTERECTOMY TOTAL LAPAROSCOPIC WITH SALPINGECTOMY;  Surgeon: Darl Edu, MD;  Location: [facility] ORS;  Service: Gynecology;  Laterality: Bilateral;  Family History  Problem Relation Age of Onset   Migraines Mother    Cancer Maternal Grandmother 52       brain tumor/breast cancer   Breast cancer Maternal Grandmother    Heart disease Maternal Grandfather        heart problems   Breast cancer Paternal Aunt 48       Contact   Colon  cancer Neg Hx    Esophageal cancer Neg Hx    Liver disease Neg Hx    Pancreatic cancer Neg Hx    Stomach cancer Neg Hx    Inflammatory bowel disease Neg Hx     Social History   Socioeconomic History   Marital status: Married    Spouse name: Not on file   Number of children: 2   Years of education: Not on file   Highest education level: Not on file  Occupational History   Occupation: insurance    Employer: [employer]  Tobacco Use   Smoking status: Never   Smokeless tobacco: Never  Vaping Use   Vaping status: Never Used  Substance and Sexual Activity   Alcohol use: No    Alcohol/week: 0.0 standard drinks of alcohol   Drug use: No   Sexual activity: Yes    Birth control/protection: Surgical    Comment: Hysterectomy  Other Topics Concern   Not on file  Social History Narrative   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, she is an Marine scientist   Rare caffeine and never smoker no alcohol no drug or substance use   Social Drivers of Corporate investment banker Strain: Low Risk  (03/23/2019)   Received from [facility], [facility]   Overall Financial Resource Strain (CARDIA)    Difficulty of Paying Living Expenses: Not hard at all  Food Insecurity: Not on file  Transportation Needs: No Transportation Needs (03/23/2019)   Received from [facility], [facility]   [facility] - Transportation    Lack of Transportation (Medical): No    Lack of Transportation (Non-Medical): No  Physical Activity: Insufficiently Active (03/02/2017)   Exercise Vital Sign    Days of Exercise per Week: 3 days    Minutes of Exercise per Session: 30 min  Stress: No Stress Concern Present (03/02/2017)   [employer] of Occupational Health - Occupational Stress Questionnaire    Feeling of Stress : Only a little  Social Connections: Moderately Integrated (03/02/2017)   Social Connection and Isolation Panel [NHANES]    Frequency of Communication with  Friends and Family: More than three times a week    Frequency of Social Gatherings with Friends and Family: More than three times a week    Attends Religious Services: More than 4 times per year    Active Member of [employer] or Organizations: No    Attends Banker Meetings: Never    Marital Status: Married  Catering manager Violence: Not At Risk (03/02/2017)   Humiliation, Afraid, Rape, and Kick questionnaire    Fear of Current or Ex-Partner: No    Emotionally Abused: No    Physically Abused: No    Sexually Abused: No    Outpatient Medications Prior to Visit  Medication Sig Dispense Refill   albuterol  (VENTOLIN  HFA) 108 (90 Base) MCG/ACT inhaler Inhale 2 puffs into the lungs every 6 (six) hours as needed for wheezing or shortness of breath. 8 g 2   Cholecalciferol (D3-1000 PO) Take by mouth.  estradiol  (ESTRACE  VAGINAL) 0.1 MG/GM vaginal cream Insert 1 g vaginally nightly for 7 nights, then 1-2 times weekly as maintenance. 42.5 g 1   Fluticasone -Umeclidin-Vilant (TRELEGY ELLIPTA ) 100-62.5-25 MCG/ACT AEPB Inhale 1 puff into the lungs daily. 1 each 11   gabapentin  (NEURONTIN ) 100 MG capsule Take 1 capsule (100 mg total) by mouth at bedtime. 30 capsule 1   ipratropium (ATROVENT) 0.06 % nasal spray Place 2 sprays into the nose.     Probiotic Product (PROBIOTIC PEARLS WOMENS PO) Take 1 capsule by mouth daily.     No facility-administered medications prior to visit.      ROS:  Review of Systems  Constitutional:  Negative for fever.  Gastrointestinal:  Negative for blood in stool, constipation, diarrhea, nausea and vomiting.  Genitourinary:  Negative for dyspareunia, dysuria, flank pain, frequency, hematuria, urgency, vaginal bleeding, vaginal discharge and vaginal pain.  Musculoskeletal:  Negative for back pain.  Skin:  Negative for rash.   BREAST: No symptoms   OBJECTIVE:   Vitals:  BP 117/74   Pulse (!) 58   Ht 5\' 4"  (1.626 m)   Wt 142 lb (64.4 kg)   LMP  01/08/2017 (Exact Date)   BMI 24.37 kg/m   Physical Exam Vitals reviewed.  Constitutional:      Appearance: She is well-developed.  Pulmonary:     Effort: Pulmonary effort is normal.  Genitourinary:    Labia:        Right: Lesion present. No rash or tenderness.        Left: No rash, tenderness or lesion.     Musculoskeletal:        General: Normal range of motion.     Cervical back: Normal range of motion.  Skin:    General: Skin is warm and dry.  Neurological:     General: No focal deficit present.     Mental Status: She is alert and oriented to person, place, and time.     Cranial Nerves: No cranial nerve deficit.  Psychiatric:        Mood and Affect: Mood normal.        Behavior: Behavior normal.        Thought Content: Thought content normal.        Judgment: Judgment normal.     Results: Results for orders placed or performed in visit on 09/02/23 (from the past 24 hours)  POCT Urinalysis Dipstick     Status: Abnormal   Collection Time: 09/02/23 11:08 AM  Result Value Ref Range   Color, UA yellow    Clarity, UA clear    Glucose, UA Negative Negative   Bilirubin, UA neg    Ketones, UA neg    Spec Grav, UA 1.010 1.010 - 1.025   Blood, UA trace    pH, UA 7.0 5.0 - 8.0   Protein, UA Negative Negative   Urobilinogen, UA     Nitrite, UA neg    Leukocytes, UA Moderate (2+) (A) Negative   Appearance     Odor       Assessment/Plan: Pelvic pressure in female - Plan: POCT Urinalysis Dipstick, Urine Culture; check C&S. If UTI, will treat. If WNL, most likely constipation. Increase fiber/water in meantime. S/p hyst, no PMB. If sx persist, will check u/s.   Vaginal lesion--resolving, looks like blocked gland. Warm compresses/reassurance. Bleeding could have been from varicosity. F/u prn.     Return if symptoms worsen or fail to improve.  Maysel Mccolm B. Laretta Pyatt, PA-C 09/02/2023 11:10 AM

## 2023-09-04 LAB — URINE CULTURE

## 2023-09-05 ENCOUNTER — Ambulatory Visit: Payer: Self-pay | Admitting: Obstetrics and Gynecology

## 2023-09-15 ENCOUNTER — Ambulatory Visit: Admitting: Physician Assistant

## 2023-09-15 ENCOUNTER — Encounter: Payer: Self-pay | Admitting: Physician Assistant

## 2023-09-15 ENCOUNTER — Other Ambulatory Visit (INDEPENDENT_AMBULATORY_CARE_PROVIDER_SITE_OTHER)

## 2023-09-15 VITALS — BP 100/60 | HR 58 | Ht 64.0 in | Wt 143.1 lb

## 2023-09-15 DIAGNOSIS — R7989 Other specified abnormal findings of blood chemistry: Secondary | ICD-10-CM | POA: Diagnosis not present

## 2023-09-15 DIAGNOSIS — R1011 Right upper quadrant pain: Secondary | ICD-10-CM

## 2023-09-15 LAB — HEPATIC FUNCTION PANEL
ALT: 28 U/L (ref 0–35)
AST: 29 U/L (ref 0–37)
Albumin: 4.3 g/dL (ref 3.5–5.2)
Alkaline Phosphatase: 69 U/L (ref 39–117)
Bilirubin, Direct: 0.1 mg/dL (ref 0.0–0.3)
Total Bilirubin: 0.9 mg/dL (ref 0.2–1.2)
Total Protein: 7.2 g/dL (ref 6.0–8.3)

## 2023-09-15 NOTE — Progress Notes (Signed)
 Chief Complaint: Elevated LFTs  HPI:    Courtney Pineda is a 53 year old female with a past medical history as listed below including chronic right upper quadrant pain, known to Dr. Willy Harvest, who was referred to me by Clemens Curt, MD for a complaint of elevated LFTs.    07/12/17 EGD and colonoscopy.  These were done with Dr. Baldomero Bone.  Colonoscopy with nonthrombosed external hemorrhoids, external hemorrhoids and otherwise normal.  EGD with nonbleeding erosive gastropathy, LA grade B reflux esophagitis otherwise normal.  Started on Omeprazole  40 mg p.o. twice daily.    05/20/2018 office visit with Dr. Willy Harvest for chronic right upper quadrant pain which was not musculoskeletal.  Discussed a local injection.    06/11/2022 AST 57, ALT 78, total bili 1.3, 07/03/2022 AST 23, ALT 17 and total bili 1.2, 06/15/2023 AST 40, ALT 54 and total bili 0.9.  Otherwise normal.  CBC normal.    07/03/2022 abdominal ultrasound done for right upper quadrant pain elevated LFTs with a surgically absent gallbladder and no fatty liver.    Today, patient presents to clinic to discuss an elevation in liver enzymes.  She tells me they have gone up and down and elevation over the past few years.  Tells me she is ready to figure out what is going on.  Apparently had a discussion with her OB/GYN who does not want to start her on any hormone replacement therapy in light of her elevated liver enzymes until "we figure out what is going on".  Describes chronic right upper quadrant pain which again was thought to be musculoskeletal in the past and really has not changed lately.  She never did get pain injections because they made her nervous and does not like to take NSAIDs or other pain medicines.  She has never tried a heating pad.    Denies tattoos, family history of liver disease, alcohol or drug use or herbal supplements.    Denies fever, chills or weight loss.   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/09/2018    Past Surgical History:  Procedure Laterality Date   ABDOMINAL HYSTERECTOMY     CHOLECYSTECTOMY  06/1994   COLONOSCOPY WITH PROPOFOL  N/A 07/12/2017   Procedure: COLONOSCOPY WITH PROPOFOL ;  Surgeon: Selena Daily, MD;  Location: [facility];  Service: Gastroenterology;  Laterality: N/A;   CYSTOSCOPY N/A 01/14/2017   Procedure: CYSTOSCOPY;  Surgeon: Darl Edu, MD;  Location: [facility];  Service: Gynecology;  Laterality: N/A;   ESOPHAGOGASTRODUODENOSCOPY (EGD) WITH PROPOFOL  N/A 07/12/2017   Procedure: ESOPHAGOGASTRODUODENOSCOPY (EGD) WITH PROPOFOL ;  Surgeon: Selena Daily, MD;  Location: [facility];  Service: Gastroenterology;  Laterality: N/A;   KIDNEY STONE SURGERY     LITHOTRIPSY  03/2006   TONSILECTOMY, ADENOIDECTOMY, BILATERAL MYRINGOTOMY AND TUBES     myringotomy tubes   TOTAL LAPAROSCOPIC HYSTERECTOMY WITH SALPINGECTOMY Bilateral 01/14/2017   Procedure: HYSTERECTOMY TOTAL LAPAROSCOPIC WITH SALPINGECTOMY;  Surgeon: Darl Edu, MD;  Location: [facility];  Service: Gynecology;  Laterality: Bilateral;    Current Outpatient Medications  Medication Sig Dispense Refill   albuterol  (VENTOLIN  HFA) 108 (90 Base) MCG/ACT inhaler Inhale 2 puffs into the lungs every 6 (six) hours as needed for wheezing  or shortness of breath. 8 g 2   Cholecalciferol (D3-1000 PO) Take by mouth.     estradiol  (ESTRACE  VAGINAL) 0.1 MG/GM vaginal cream Insert 1 g vaginally nightly for 7 nights, then 1-2 times weekly as maintenance. 42.5 g 1   Fluticasone -Umeclidin-Vilant (TRELEGY ELLIPTA ) 100-62.5-25 MCG/ACT AEPB Inhale 1 puff into the lungs daily. 1 each 11   gabapentin  (NEURONTIN ) 100 MG capsule Take 1 capsule (100 mg total) by mouth at bedtime. 30 capsule 1    ipratropium (ATROVENT) 0.06 % nasal spray Place 2 sprays into the nose.     Probiotic Product (PROBIOTIC PEARLS WOMENS PO) Take 1 capsule by mouth daily.     No current facility-administered medications for this visit.    Allergies as of 09/15/2023 - Review Complete 09/02/2023  Allergen Reaction Noted   Amoxicillin Anaphylaxis 10/18/2014   Bee venom Anaphylaxis 01/07/2017   Sulfa antibiotics Anaphylaxis 10/18/2014   Sulfonamide derivatives Anaphylaxis 08/20/2006   Covid-19 (mrna) vaccine  06/17/2021   Penicillins Rash 01/07/2017    Family History  Problem Relation Age of Onset   Migraines Mother    Cancer Maternal Grandmother 9       brain tumor/breast cancer   Breast cancer Maternal Grandmother    Heart disease Maternal Grandfather        heart problems   Breast cancer Paternal Aunt 62       Contact   Colon cancer Neg Hx    Esophageal cancer Neg Hx    Liver disease Neg Hx    Pancreatic cancer Neg Hx    Stomach cancer Neg Hx    Inflammatory bowel disease Neg Hx     Social History   Socioeconomic History   Marital status: Married    Spouse name: Not on file   Number of children: 2   Years of education: Not on file   Highest education level: Not on file  Occupational History   Occupation: Neurosurgeon: [employer]  Tobacco Use   Smoking status: Never   Smokeless tobacco: Never  Vaping Use   Vaping status: Never Used  Substance and Sexual Activity   Alcohol use: No    Alcohol/week: 0.0 standard drinks of alcohol   Drug use: No   Sexual activity: Yes    Birth control/protection: Surgical    Comment: Hysterectomy  Other Topics Concern   Not on file  Social History Narrative   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, she is an Marine scientist   Rare caffeine and never smoker no alcohol no drug or substance use   Social Drivers of Corporate investment banker Strain: Low Risk  (03/23/2019)   Received from [facility],  [facility]   Overall Financial Resource Strain (CARDIA)    Difficulty of Paying Living Expenses: Not hard at all  Food Insecurity: Not on file  Transportation Needs: No Transportation Needs (03/23/2019)   Received from [facility], [facility]   [facility] - Transportation    Lack of Transportation (Medical): No    Lack of Transportation (Non-Medical): No  Physical Activity: Insufficiently Active (03/02/2017)   Exercise Vital Sign    Days of Exercise per Week: 3 days    Minutes of Exercise per Session: 30 min  Stress: No Stress Concern Present (03/02/2017)   [employer] of Occupational Health - Occupational Stress Questionnaire    Feeling of Stress : Only a little  Social Connections: Moderately Integrated (  03/02/2017)   Social Connection and Isolation Panel [NHANES]    Frequency of Communication with Friends and Family: More than three times a week    Frequency of Social Gatherings with Friends and Family: More than three times a week    Attends Religious Services: More than 4 times per year    Active Member of [employer] or Organizations: No    Attends Banker Meetings: Never    Marital Status: Married  Catering manager Violence: Not At Risk (03/02/2017)   Humiliation, Afraid, Rape, and Kick questionnaire    Fear of Current or Ex-Partner: No    Emotionally Abused: No    Physically Abused: No    Sexually Abused: No    Review of Systems:    Constitutional: No weight loss, fever or chills Skin: No rash  Cardiovascular: No chest pain Respiratory: No SOB Gastrointestinal: See HPI and otherwise negative Genitourinary: No dysuria  Neurological: No headache, dizziness or syncope Musculoskeletal: No new muscle or joint pain Hematologic: No bleeding  Psychiatric: No history of depression or anxiety   Physical Exam:  Vital signs: BP 100/60 (BP Location: Right Arm, Patient Position: Sitting, Cuff Size: Normal)   Pulse (!) 58   Ht 5\' 4"  (1.626 m)   Wt 143  lb 1 oz (64.9 kg)   LMP 01/08/2017 (Exact Date)   BMI 24.56 kg/m    Constitutional:   Pleasant Caucasian female appears to be in NAD, Well developed, Well nourished, alert and cooperative Head:  Normocephalic and atraumatic. Eyes:   PEERL, EOMI. No icterus. Conjunctiva pink. Ears:  Normal auditory acuity. Neck:  Supple Throat: Oral cavity and pharynx without inflammation, swelling or lesion.  Respiratory: Respirations even and unlabored. Lungs clear to auscultation bilaterally.   No wheezes, crackles, or rhonchi.  Cardiovascular: Normal S1, S2. No MRG. Regular rate and rhythm. No peripheral edema, cyanosis or pallor.  Gastrointestinal:  Soft, nondistended, nontender. No rebound or guarding. Normal bowel sounds. No appreciable masses or hepatomegaly. Rectal:  Not performed.  Msk:  Symmetrical without gross deformities. Without edema, no deformity or joint abnormality. + Moderate right upper quadrant TTP to only light palpation Neurologic:  Alert and  oriented x4;  grossly normal neurologically.  Skin:   Dry and intact without significant lesions or rashes. Psychiatric: Demonstrates good judgement and reason without abnormal affect or behaviors.  RELEVANT LABS AND IMAGING: CBC    Component Value Date/Time   WBC 8.4 06/22/2023 1513   RBC 4.52 06/22/2023 1513   HGB 14.0 06/22/2023 1513   HGB 16.1 (H) 11/17/2018 1526   HCT 42.2 06/22/2023 1513   HCT 46.9 (H) 11/17/2018 1526   PLT 276 06/22/2023 1513   PLT 281 11/17/2018 1526   MCV 93.4 06/22/2023 1513   MCV 90 11/17/2018 1526   MCH 31.0 06/22/2023 1513   MCHC 33.2 06/22/2023 1513   RDW 12.5 06/22/2023 1513   RDW 11.7 11/17/2018 1526   LYMPHSABS 1.5 06/15/2023 0841   MONOABS 0.3 06/15/2023 0841   EOSABS 0.1 06/15/2023 0841   BASOSABS 0.0 06/15/2023 0841    CMP     Component Value Date/Time   NA 139 06/22/2023 1513   K 3.6 06/22/2023 1513   CL 104 06/22/2023 1513   CO2 24 06/22/2023 1513   GLUCOSE 107 (H) 06/22/2023 1513    BUN 15 06/22/2023 1513   CREATININE 0.87 06/22/2023 1513   CALCIUM 9.3 06/22/2023 1513   PROT 6.8 06/15/2023 0841   ALBUMIN 4.2 06/15/2023 0841  AST 40 (H) 06/15/2023 0841   ALT 54 (H) 06/15/2023 0841   ALKPHOS 91 06/15/2023 0841   BILITOT 0.9 06/15/2023 0841   GFRNONAA >60 06/22/2023 1513   GFRAA >60 01/15/2017 0424    Assessment: 1.  Elevated LFTs: Mild elevation in transaminases, most recently checked in March, prior to that normal, ultrasound in '22 with no sign of fatty liver; most likely fatty liver 2.  Chronic right upper quadrant pain: Evaluated in 2020 and thought most likely musculoskeletal 3.  Screening for colorectal cancer: Last colonoscopy in 2019 with repeat recommended 10 years  Plan: 1.  Recheck right upper quadrant ultrasound with elastography. 2.  Recheck hepatic function panel. 3.  If liver enzymes are still elevated will follow-up with further liver workup. 4.  Patient to follow in the clinic with me in 2 months or sooner if necessary.  Reginal Capra, PA-C [facility] 09/15/2023, 2:00 PM  Cc: Tower, Manley Seeds, MD

## 2023-09-15 NOTE — Patient Instructions (Addendum)
 Your provider has requested that you go to the basement level for lab work before leaving today. Press "B" on the elevator. The lab is located at the first door on the left as you exit the elevator.  You have been scheduled for an abdominal ultrasound at [facility] on Wednesday 09/22/23 at 8 am. Please arrive 30 minutes prior to your appointment for registration. Make certain not to have anything to eat or drink 6 hours prior to your appointment. Should you need to reschedule your appointment, please contact radiology at [phone]. This test typically takes about 30 minutes to perform.  _______________________________________________________  If your blood pressure at your visit was 140/90 or greater, please contact your primary care physician to follow up on this.  _______________________________________________________  If you are age 26 or older, your body mass index should be between 23-30. Your Body mass index is 24.56 kg/m. If this is out of the aforementioned range listed, please consider follow up with your Primary Care Provider.  If you are age 64 or younger, your body mass index should be between 19-25. Your Body mass index is 24.56 kg/m. If this is out of the aformentioned range listed, please consider follow up with your Primary Care Provider.   ________________________________________________________  The [facility] providers would like to encourage you to use MYCHART to communicate with providers for non-urgent requests or questions.  Due to long hold times on the telephone, sending your provider a message by [facility] may be a faster and more efficient way to get a response.  Please allow 48 business hours for a response.  Please remember that this is for non-urgent requests.  _______________________________________________________

## 2023-09-16 ENCOUNTER — Ambulatory Visit: Payer: Self-pay | Admitting: Physician Assistant

## 2023-09-20 ENCOUNTER — Ambulatory Visit (HOSPITAL_COMMUNITY)

## 2023-09-22 ENCOUNTER — Ambulatory Visit
Admission: RE | Admit: 2023-09-22 | Discharge: 2023-09-22 | Disposition: A | Discharge status: Home / Self Care | Source: Ambulatory Visit | Attending: Physician Assistant | Admitting: Physician Assistant

## 2023-09-22 DIAGNOSIS — R7989 Other specified abnormal findings of blood chemistry: Secondary | ICD-10-CM | POA: Diagnosis not present

## 2023-09-28 ENCOUNTER — Other Ambulatory Visit: Payer: Self-pay

## 2023-09-28 DIAGNOSIS — R1011 Right upper quadrant pain: Secondary | ICD-10-CM

## 2023-09-28 NOTE — Telephone Encounter (Signed)
 Referral order for sports medicine Zach Smith at this time.

## 2023-09-29 ENCOUNTER — Telehealth: Payer: Self-pay | Admitting: Family Medicine

## 2023-09-29 NOTE — Telephone Encounter (Signed)
 Pt advised [facility] can see results in epic but if she wants to pics she has to go to the imaging center she had it done it.

## 2023-09-29 NOTE — Telephone Encounter (Signed)
 Copied from CRM [id number]. Topic: Medical Record Request - Records Request >> Sep 29, 2023  9:22 AM [place] A wrote: Reason for CRM: Patient would like CT scan sent to [facility]

## 2023-09-30 ENCOUNTER — Other Ambulatory Visit: Payer: Self-pay | Admitting: Neurology

## 2023-09-30 DIAGNOSIS — R200 Anesthesia of skin: Secondary | ICD-10-CM

## 2023-09-30 DIAGNOSIS — R519 Headache, unspecified: Secondary | ICD-10-CM

## 2023-10-01 ENCOUNTER — Ambulatory Visit: Admitting: Gastroenterology

## 2023-10-01 ENCOUNTER — Encounter: Payer: Self-pay | Admitting: Gastroenterology

## 2023-10-01 VITALS — BP 110/64 | HR 55

## 2023-10-01 DIAGNOSIS — K5900 Constipation, unspecified: Secondary | ICD-10-CM | POA: Insufficient documentation

## 2023-10-01 DIAGNOSIS — R1011 Right upper quadrant pain: Secondary | ICD-10-CM | POA: Diagnosis not present

## 2023-10-01 DIAGNOSIS — R1901 Right upper quadrant abdominal swelling, mass and lump: Secondary | ICD-10-CM | POA: Insufficient documentation

## 2023-10-01 NOTE — Progress Notes (Signed)
 10/01/2023 Courtney Pineda [id number] 03/18/1971   HISTORY OF PRESENT ILLNESS: This is a 53 year old female who is a patient of Dr. Jadene Maxwell.  She was just seen here 16 days ago for complaints of chronic right upper quadrant abdominal pain that was thought to be musculoskeletal.  She was referred to sports medicine.  Nonetheless, she called back saying she feels a lump in the right upper quadrant.  Given this appointment today.  She says that when she stands up she feels like a bulge or lump in the right upper quadrant.  She has been experiencing constipation for the past 7 months or so.  Her GYN suggested she start taking Metamucil.  She just started 1 teaspoon yesterday.  So far she feels like this morning her stool was a little bit softer than normal so maybe it is already helping.   Past Medical History:  Diagnosis Date   Anemia    Asthma    Chronic right upper quadrant pain    Dysplastic nevus 09/16/2015   left infra axillary   Dysplastic nevus 10/27/2017   left side   History of kidney stones    Hyperemesis gravidarum 1999   [place] intestinal bacterial overgrowth 02/09/2018   Past Surgical History:  Procedure Laterality Date   ABDOMINAL HYSTERECTOMY     CHOLECYSTECTOMY  06/1994   COLONOSCOPY WITH PROPOFOL  N/A 07/12/2017   Procedure: COLONOSCOPY WITH PROPOFOL ;  Surgeon: Selena Daily, MD;  Location: [facility];  Service: Gastroenterology;  Laterality: N/A;   CYSTOSCOPY N/A 01/14/2017   Procedure: CYSTOSCOPY;  Surgeon: Darl Edu, MD;  Location: [facility] OR[facility];  Service: Gynecology;  Laterality: N/A;   ESOPHAGOGASTRODUODENOSCOPY (EGD) WITH PROPOFOL  N/A 07/12/2017   Procedure: ESOPHAGOGASTRODUODENOSCOPY (EGD) WITH PROPOFOL ;  Surgeon: Selena Daily, MD;  Location: [facility];  Service: Gastroenterology;  Laterality: N/A;   KIDNEY STONE SURGERY     LITHOTRIPSY  03/2006   TONSILECTOMY, ADENOIDECTOMY, BILATERAL MYRINGOTOMY AND TUBES     myringotomy  tubes   TOTAL LAPAROSCOPIC HYSTERECTOMY WITH SALPINGECTOMY Bilateral 01/14/2017   Procedure: HYSTERECTOMY TOTAL LAPAROSCOPIC WITH SALPINGECTOMY;  Surgeon: Darl Edu, MD;  Location: [facility] OR[facility];  Service: Gynecology;  Laterality: Bilateral;    reports that she has never smoked. She has never used smokeless tobacco. She reports that she does not drink alcohol and does not use drugs. family history includes Breast cancer in her maternal grandmother; Breast cancer (age of onset: 28) in her paternal aunt; Cancer (age of onset: 31) in her maternal grandmother; Heart disease in her maternal grandfather; Migraines in her mother; Osteopenia in her sister; Osteoporosis in her sister. Allergies  Allergen Reactions   Amoxicillin Anaphylaxis   Bee Venom Anaphylaxis   Sulfa Antibiotics Anaphylaxis   Sulfonamide Derivatives Anaphylaxis    Lungs close up   Covid-19 (Mrna) Vaccine     J and J Local reaction    Penicillins Rash    Has patient had a PCN reaction causing immediate rash, facial/tongue/throat swelling, SOB or lightheadedness with hypotension: No Has patient had a PCN reaction causing severe rash involving mucus membranes or skin necrosis: No Has patient had a PCN reaction that required hospitalization: No Has patient had a PCN reaction occurring within the last 10 years: No If all of the above answers are NO, then may proceed with Cephalosporin use.       Outpatient Encounter Medications as of 10/01/2023  Medication Sig   albuterol  (VENTOLIN  HFA) 108 (90 Base) MCG/ACT inhaler Inhale 2 puffs into the lungs  every 6 (six) hours as needed for wheezing or shortness of breath.   Cholecalciferol (D3-1000 PO) Take by mouth.   estradiol  (ESTRACE  VAGINAL) 0.1 MG/GM vaginal cream Insert 1 g vaginally nightly for 7 nights, then 1-2 times weekly as maintenance.   Fluticasone -Umeclidin-Vilant (TRELEGY ELLIPTA ) 100-62.5-25 MCG/ACT AEPB Inhale 1 puff into the lungs daily.   gabapentin  (NEURONTIN ) 100  MG capsule Take 1 capsule (100 mg total) by mouth at bedtime.   Probiotic Product (PROBIOTIC PEARLS WOMENS PO) Take 1 capsule by mouth daily.   ipratropium (ATROVENT) 0.06 % nasal spray Place 2 sprays into the nose. (Patient not taking: Reported on 10/01/2023)   No facility-administered encounter medications on file as of 10/01/2023.   REVIEW OF SYSTEMS  : All other systems reviewed and negative except where noted in the History of Present Illness.   PHYSICAL EXAM: BP 110/64 (BP Location: Right Arm, Patient Position: Sitting, Cuff Size: Normal)   Pulse (!) 55   LMP 01/08/2017 (Exact Date)  General: Well developed white female in no acute distress Head: Normocephalic and atraumatic Eyes:  Sclerae anicteric, conjunctiva pink. Ears: Normal auditory acuity Abdomen: Soft, non-distended.  Mild TTP in RUQ, just to the right of the epigastrium.  No lump appreciated, but question if it did have some fullness to the area. Musculoskeletal: Symmetrical with no gross deformities  Skin: No lesions on visible extremities Neurological: Alert oriented x 4, grossly non-focal Psychological:  Alert and cooperative. Normal mood and affect  ASSESSMENT AND PLAN: *Right upper quadrant abdominal pain with sensation of a lump:  No lump appreciated on exam today, may be more of a fullness in the right upper quadrant.  Question if she could have a hernia.  Will perform CT scan of the abdomen with contrast to rule out any other concerning issues and give reassurance. *Constipation: Has started Metamucil 1 teaspoon daily just yesterday and felt like maybe it has helped already.  Discussed increasing that as needed, but also needs to drink plenty of water, eat dietary fruits vegetables, etc.  Also discussed possibly trying MiraLAX if needed as well.   CC:  Tower, Manley Seeds, MD

## 2023-10-01 NOTE — Patient Instructions (Addendum)
 You have been scheduled for a CT scan of the abdomen at [facility], 1st floor Radiology. You are scheduled on Thursday 10/12/23 at 1:30 pm. You should arrive 15 minutes prior to your appointment time for registration.     Please follow the written instructions below on the day of your exam:   1) Do not eat anything after 10:30 am (4 hours prior to your test)   You may take any medications as prescribed with a Gergen amount of water, if necessary. If you take any of the following medications: METFORMIN, GLUCOPHAGE, GLUCOVANCE, AVANDAMET, RIOMET, FORTAMET, ACTOPLUS MET, JANUMET, GLUMETZA or METAGLIP, you MAY be asked to HOLD this medication 48 hours AFTER the exam.   The purpose of you drinking the oral contrast is to aid in the visualization of your intestinal tract. The contrast solution may cause some diarrhea. Depending on your individual set of symptoms, you may also receive an intravenous injection of x-ray contrast/dye. Plan on being at [facility] for 45 minutes or longer, depending on the type of exam you are having performed.   If you have any questions regarding your exam or if you need to reschedule, you may call [facility] at [phone] between the hours of 8:00 am and 5:00 pm, Monday-Friday.

## 2023-10-05 NOTE — Progress Notes (Signed)
 Courtney Pineda Courtney Pineda [facility] [address] [place] [address] Phone: [phone]   Assessment and Plan:     1. RUQ abdominal pain -Chronic with exacerbation, initial visit - Unclear etiology of right upper quadrant pain present for 7+ years.  Patient describes a lump that she will feel over right upper quadrant below rib cage that will fluctuate in size.  No discernible lump felt on physical exam.  CT abdomen and right upper quadrant ultrasound did not reveal that could explain ongoing symptoms.  Patient may be experiencing discomfort along abdominal wall musculature, however I would not expect nontraumatic muscular pain to be present for 7 years. - We will trial a conservative course focusing on the abdominal wall musculature to see if patient receives any benefit - Start meloxicam  15 mg daily x2 weeks.  If still having pain after 2 weeks, complete 3rd-week of NSAID. May use remaining NSAID as needed once daily for pain control.  Do not to use additional over-the-counter NSAIDs (ibuprofen , naproxen , Advil , Aleve , etc.) while taking prescription NSAIDs.  May use Tylenol  [phone] mg 2 to 3 times a day for breakthrough pain. -Start HEP and physical therapy for core and abdominal wall musculature   15 additional minutes spent for educating Therapeutic Home Exercise Program.  This included exercises focusing on stretching, strengthening, with focus on eccentric aspects.   Long term goals include an improvement in range of motion, strength, endurance as well as avoiding reinjury. Patient's frequency would include in 1-2 times a day, 3-5 times a week for a duration of 6-12 weeks. Proper technique shown and discussed handout in great detail with ATC.  All questions were discussed and answered.    Pertinent previous records reviewed include CT abdomen 10/10/2023, right upper quadrant ultrasound 09/22/2023, GI note 10/01/2023  Follow Up: 6 weeks for reevaluation.    Subjective:   I, Courtney Pineda, am serving as a Neurosurgeon for Doctor Courtney Pineda  Chief Complaint: right upper quadrant pain   HPI:   10/13/2023 Patient is a 53 year old female with right upper quadrant pain. Patient states pain has been going on for several years intermittent. She feels a lump. CT came back unremarkable. No MOI. No triggers for when the pain will flare. No pain when taking deep breathes. No radiating pain. 2018 had hysterectomy and that's when she noticed it .    Relevant Historical Information: None pertinent  Additional pertinent review of systems negative.   Current Outpatient Medications:    meloxicam  (MOBIC ) 15 MG tablet, Take 1 tablet (15 mg total) by mouth daily., Disp: 30 tablet, Rfl: 0   albuterol  (VENTOLIN  HFA) 108 (90 Base) MCG/ACT inhaler, Inhale 2 puffs into the lungs every 6 (six) hours as needed for wheezing or shortness of breath., Disp: 8 g, Rfl: 2   Cholecalciferol (D3-1000 PO), Take by mouth., Disp: , Rfl:    estradiol  (ESTRACE  VAGINAL) 0.1 MG/GM vaginal cream, Insert 1 g vaginally nightly for 7 nights, then 1-2 times weekly as maintenance., Disp: 42.5 g, Rfl: 1   Fluticasone -Umeclidin-Vilant (TRELEGY ELLIPTA ) 100-62.5-25 MCG/ACT AEPB, Inhale 1 puff into the lungs daily., Disp: 1 each, Rfl: 11   gabapentin  (NEURONTIN ) 100 MG capsule, Take 1 capsule (100 mg total) by mouth at bedtime., Disp: 30 capsule, Rfl: 1   Probiotic Product (PROBIOTIC PEARLS WOMENS PO), Take 1 capsule by mouth daily., Disp: , Rfl:    Objective:     Vitals:   10/13/23 1518  BP: 120/80  Pulse: (!) 54  SpO2: 99%  Weight: 143 lb (64.9 kg)  Height: 5' 4 (1.626 m)      Body mass index is 24.55 kg/m.    Physical Exam:    General: Well-appearing, cooperative, sitting comfortably in no acute distress.  HEENT: Normocephalic, atraumatic.   Neck: No gross abnormality.  Cardiovascular: No pallor or cyanosis. Resp: Comfortable WOB.   Abdomen: Non distended.  TTP right  upper quadrant.  No pain with resisted abdominal crunch.  Abdominal pain and back pain with none resisted abdominal crunch.  No mass or herniation felt with patient lying or standing. Skin: Warm and dry; no focal rashes identified on limited exam. Extremities: No cyanosis or edema.  Neuro: Gross motor and sensory intact. Gait normal. Psychiatric: Mood and affect are appropriate.    Electronically signed by:  Odis Pineda [facility] 3:45 PM 10/13/23

## 2023-10-08 ENCOUNTER — Ambulatory Visit
Admission: RE | Admit: 2023-10-08 | Discharge: 2023-10-08 | Disposition: A | Discharge status: Home / Self Care | Source: Ambulatory Visit | Attending: Gastroenterology | Admitting: Gastroenterology

## 2023-10-08 ENCOUNTER — Ambulatory Visit
Admission: RE | Admit: 2023-10-08 | Discharge: 2023-10-08 | Disposition: A | Discharge status: Home / Self Care | Source: Ambulatory Visit | Attending: Neurology | Admitting: Neurology

## 2023-10-08 DIAGNOSIS — R200 Anesthesia of skin: Secondary | ICD-10-CM | POA: Insufficient documentation

## 2023-10-08 DIAGNOSIS — R519 Headache, unspecified: Secondary | ICD-10-CM | POA: Insufficient documentation

## 2023-10-08 DIAGNOSIS — R1901 Right upper quadrant abdominal swelling, mass and lump: Secondary | ICD-10-CM | POA: Diagnosis present

## 2023-10-08 DIAGNOSIS — R1011 Right upper quadrant pain: Secondary | ICD-10-CM | POA: Diagnosis present

## 2023-10-08 MED ORDER — IOHEXOL 9 MG/ML PO SOLN
500.0000 mL | ORAL | Status: AC
  Administered 2023-10-08 (×2): 500 mL via ORAL

## 2023-10-08 MED ORDER — IOHEXOL 300 MG/ML  SOLN
85.0000 mL | Freq: Once | INTRAMUSCULAR | Status: AC | PRN
  Administered 2023-10-08: 85 mL via INTRAVENOUS

## 2023-10-11 ENCOUNTER — Ambulatory Visit: Payer: Self-pay | Admitting: Physician Assistant

## 2023-10-12 ENCOUNTER — Ambulatory Visit

## 2023-10-13 ENCOUNTER — Ambulatory Visit: Admitting: Sports Medicine

## 2023-10-13 VITALS — BP 120/80 | HR 54 | Ht 64.0 in | Wt 143.0 lb

## 2023-10-13 DIAGNOSIS — R1011 Right upper quadrant pain: Secondary | ICD-10-CM

## 2023-10-13 MED ORDER — MELOXICAM 15 MG PO TABS: 15.0000 mg | ORAL_TABLET | Freq: Every day | ORAL | 0 refills | Status: DC

## 2023-10-13 NOTE — Patient Instructions (Signed)
-   Start meloxicam  15 mg daily x2 weeks.  If still having pain after 2 weeks, complete 3rd-week of NSAID. May use remaining NSAID as needed once daily for pain control.  Do not to use additional over-the-counter NSAIDs (ibuprofen , naproxen , Advil , Aleve , etc.) while taking prescription NSAIDs.  May use Tylenol  [phone] mg 2 to 3 times a day for breakthrough pain. Core HEP  PT referral  6 week follow up

## 2023-10-14 ENCOUNTER — Other Ambulatory Visit (HOSPITAL_COMMUNITY)

## 2023-10-19 ENCOUNTER — Other Ambulatory Visit: Payer: Self-pay | Admitting: Neurology

## 2023-10-19 DIAGNOSIS — M899 Disorder of bone, unspecified: Secondary | ICD-10-CM

## 2023-10-19 DIAGNOSIS — M542 Cervicalgia: Secondary | ICD-10-CM

## 2023-10-20 ENCOUNTER — Ambulatory Visit
Admission: RE | Admit: 2023-10-20 | Discharge: 2023-10-20 | Disposition: A | Discharge status: Home / Self Care | Source: Ambulatory Visit | Attending: Neurology | Admitting: Neurology

## 2023-10-20 ENCOUNTER — Other Ambulatory Visit: Payer: Self-pay

## 2023-10-20 DIAGNOSIS — M542 Cervicalgia: Secondary | ICD-10-CM | POA: Diagnosis present

## 2023-10-20 DIAGNOSIS — M899 Disorder of bone, unspecified: Secondary | ICD-10-CM

## 2023-10-20 MED ORDER — GADOBUTROL 1 MMOL/ML IV SOLN
6.0000 mL | Freq: Once | INTRAVENOUS | Status: AC | PRN
  Administered 2023-10-20: 6 mL via INTRAVENOUS

## 2023-11-24 ENCOUNTER — Ambulatory Visit: Admitting: Sports Medicine

## 2023-12-21 ENCOUNTER — Ambulatory Visit

## 2023-12-29 ENCOUNTER — Encounter: Payer: Self-pay | Admitting: Obstetrics and Gynecology

## 2023-12-29 DIAGNOSIS — N9489 Other specified conditions associated with female genital organs and menstrual cycle: Secondary | ICD-10-CM

## 2023-12-29 DIAGNOSIS — N838 Other noninflammatory disorders of ovary, fallopian tube and broad ligament: Secondary | ICD-10-CM

## 2023-12-29 DIAGNOSIS — R140 Abdominal distension (gaseous): Secondary | ICD-10-CM

## 2023-12-30 ENCOUNTER — Encounter: Payer: Self-pay | Admitting: Obstetrics and Gynecology

## 2023-12-30 ENCOUNTER — Encounter

## 2023-12-30 ENCOUNTER — Ambulatory Visit (INDEPENDENT_AMBULATORY_CARE_PROVIDER_SITE_OTHER): Admitting: Obstetrics and Gynecology

## 2023-12-30 VITALS — BP 100/66 | HR 61 | Ht 64.0 in | Wt 146.0 lb

## 2023-12-30 DIAGNOSIS — R140 Abdominal distension (gaseous): Secondary | ICD-10-CM

## 2023-12-30 DIAGNOSIS — N898 Other specified noninflammatory disorders of vagina: Secondary | ICD-10-CM

## 2023-12-30 DIAGNOSIS — N950 Postmenopausal bleeding: Secondary | ICD-10-CM

## 2023-12-30 DIAGNOSIS — R3915 Urgency of urination: Secondary | ICD-10-CM | POA: Diagnosis not present

## 2023-12-30 DIAGNOSIS — N941 Unspecified dyspareunia: Secondary | ICD-10-CM

## 2023-12-30 DIAGNOSIS — N952 Postmenopausal atrophic vaginitis: Secondary | ICD-10-CM

## 2023-12-30 LAB — POCT URINALYSIS DIPSTICK
Bilirubin, UA: NEGATIVE
Glucose, UA: NEGATIVE
Ketones, UA: NEGATIVE
Nitrite, UA: NEGATIVE
Protein, UA: NEGATIVE
Spec Grav, UA: 1.01 (ref 1.010–1.025)
pH, UA: 6 (ref 5.0–8.0)

## 2023-12-30 LAB — POCT WET PREP WITH KOH
Clue Cells Wet Prep HPF POC: NEGATIVE
KOH Prep POC: NEGATIVE
Trichomonas, UA: NEGATIVE
Yeast Wet Prep HPF POC: NEGATIVE

## 2023-12-30 MED ORDER — ESTRADIOL 0.1 MG/GM VA CREA: TOPICAL_CREAM | VAGINAL | 1 refills | Status: AC

## 2023-12-30 NOTE — Progress Notes (Signed)
 Tower, Laine LABOR, MD   Chief Complaint  Patient presents with   Vaginal Discharge    Fishy odor, itching and irritation x 2-3 months.   Vaginal Pain    During intercourse for a while.   Urinary Tract Infection    Frequency urinating, can't hold it.    HPI:      [name] is a 53 y.o. [id number] whose LMP was Patient's last menstrual period was 01/08/2017 (exact date)., presents today for brown vag d/c with odor/irritation for the past 2-3 months. Wears pantyliner due to d/c; no sx today but had it yesterday. Had PMB in past with neg exam and thought to be related to vag ERT use, so not using vag ERT for many months and PMB has resumed. Neg pap/neg HPV DNA 3/25. S/p total lap hyst with salpingectomy 2018 with Dr. Lake for AUB/leio. No meds to treat vaginal itching, no recent abx use. No new soaps. Does have dyspareunia, not using lubricants. Last sexually active about 3 wks ago; but PMB started before then.  Also with urinary frequency with good flow, urgency with urge incont at times, nocturia; no dysuria/hematuria/pelvic pain/fevers. No caffeine use. Urine has mild odor.  Has been having persistent bloating past few months; hx of constipation. Taking probiotics but not fiber supp. Drinking water. No FH ovar ca.   Patient Active Problem List   Diagnosis Date Noted   Right upper quadrant abdominal mass 10/01/2023   Constipation 10/01/2023   Symptoms, such as flushing, sleeplessness, headache, lack of concentration, associated with the menopause 07/23/2023   Dysuria 07/23/2023   Left flank pain 07/23/2023   Numbness and tingling in left hand 07/23/2023   Family history of osteoporosis in sister 07/12/2023   Encounter for screening for HIV 06/21/2023   Encounter for hepatitis C screening test for low risk patient 06/21/2023   Routine general medical examination at a health care facility 06/13/2023   Cerumen impaction 06/19/2022   SUI (stress urinary incontinence,  female) 07/07/2021   Encounter for screening mammogram for breast cancer 04/30/2021   Asthma 11/12/2020   DOE (dyspnea on exertion) 07/09/2020   Allergic rhinitis 12/25/2019   Vaccine reaction 07/26/2019   Perimenopause 06/08/2018   Low HDL (under 40) 06/08/2018   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   Cyst of right kidney 06/04/2017   RUQ abdominal pain 05/31/2017   Elevated glucose level 05/22/2017   S/P laparoscopic hysterectomy 01/14/2017   Cough variant asthma 06/01/2016   Encounter for routine gynecological examination 05/23/2014   Burning sensation of mouth 09/12/2013   Elevated transaminase level 01/13/2011   Encounter for routine adult medical exam with abnormal findings 01/05/2011   KIDNEY STONE 03/27/2010   Atypical chest pain 07/18/2008   STRESS REACTION, ACUTE, WITH EMOTIONAL DISTURBANCE 09/13/2007   Headache 09/13/2007    Past Surgical History:  Procedure Laterality Date   ABDOMINAL HYSTERECTOMY     CHOLECYSTECTOMY  06/1994   COLONOSCOPY WITH PROPOFOL  N/A 07/12/2017   Procedure: COLONOSCOPY WITH PROPOFOL ;  Surgeon: Unk Corinn Skiff, MD;  Location: [facility] ENDOSCOPY;  Service: Gastroenterology;  Laterality: N/A;   CYSTOSCOPY N/A 01/14/2017   Procedure: CYSTOSCOPY;  Surgeon: Lake Read, MD;  Location: [facility] ORS;  Service: Gynecology;  Laterality: N/A;   ESOPHAGOGASTRODUODENOSCOPY (EGD) WITH PROPOFOL  N/A 07/12/2017   Procedure: ESOPHAGOGASTRODUODENOSCOPY (EGD) WITH PROPOFOL ;  Surgeon: Unk Corinn Skiff, MD;  Location: [facility] ENDOSCOPY;  Service: Gastroenterology;  Laterality: N/A;   KIDNEY STONE SURGERY     LITHOTRIPSY  03/2006   TONSILECTOMY, ADENOIDECTOMY, BILATERAL MYRINGOTOMY AND TUBES     myringotomy tubes   TOTAL LAPAROSCOPIC HYSTERECTOMY WITH SALPINGECTOMY Bilateral 01/14/2017   Procedure: HYSTERECTOMY TOTAL LAPAROSCOPIC WITH SALPINGECTOMY;  Surgeon: Lake Read, MD;  Location: [facility] ORS;  Service: Gynecology;  Laterality: Bilateral;     Family History  Problem Relation Age of Onset   Migraines Mother    Osteopenia Sister    Osteoporosis Sister    Cancer Maternal Grandmother 64       brain tumor/breast cancer   Breast cancer Maternal Grandmother    Heart disease Maternal Grandfather        heart problems   Breast cancer Paternal Aunt 7       Contact   Colon cancer Neg Hx    Esophageal cancer Neg Hx    Liver disease Neg Hx    Pancreatic cancer Neg Hx    Stomach cancer Neg Hx    Inflammatory bowel disease Neg Hx     Social History   Socioeconomic History   Marital status: Married    Spouse name: Not on file   Number of children: 2   Years of education: Not on file   Highest education level: Not on file  Occupational History   Occupation: insurance    Employer: [employer]  Tobacco Use   Smoking status: Never   Smokeless tobacco: Never  Vaping Use   Vaping status: Never Used  Substance and Sexual Activity   Alcohol use: No    Alcohol/week: 0.0 standard drinks of alcohol   Drug use: No   Sexual activity: Yes    Birth control/protection: Surgical    Comment: Hysterectomy  Other Topics Concern   Not on file  Social History Narrative   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, she is an Marine scientist   Rare caffeine and never smoker no alcohol no drug or substance use   Social Drivers of Corporate investment banker Strain: Low Risk  (03/23/2019)   Received from [facility]   Overall Financial Resource Strain (CARDIA)    Difficulty of Paying Living Expenses: Not hard at all  Food Insecurity: Not on file  Transportation Needs: No Transportation Needs (03/23/2019)   Received from [facility]   PRAPARE - Transportation    Lack of Transportation (Medical): No    Lack of Transportation (Non-Medical): No  Physical Activity: Insufficiently Active (03/02/2017)   Exercise Vital Sign    Days of Exercise per Week: 3 days    Minutes of Exercise per Session: 30 min   Stress: No Stress Concern Present (03/02/2017)   [employer] of Occupational Health - Occupational Stress Questionnaire    Feeling of Stress : Only a little  Social Connections: Moderately Integrated (03/02/2017)   Social Connection and Isolation Panel    Frequency of Communication with Friends and Family: More than three times a week    Frequency of Social Gatherings with Friends and Family: More than three times a week    Attends Religious Services: More than 4 times per year    Active Member of [employer] or Organizations: No    Attends Banker Meetings: Never    Marital Status: Married  Catering manager Violence: Not At Risk (03/02/2017)   Humiliation, Afraid, Rape, and Kick questionnaire    Fear of Current or Ex-Partner: No    Emotionally Abused: No    Physically Abused: No    Sexually Abused: No  Outpatient Medications Prior to Visit  Medication Sig Dispense Refill   albuterol  (VENTOLIN  HFA) 108 (90 Base) MCG/ACT inhaler Inhale 2 puffs into the lungs every 6 (six) hours as needed for wheezing or shortness of breath. 8 g 2   Cholecalciferol (D3-1000 PO) Take by mouth.     Fluticasone -Umeclidin-Vilant (TRELEGY ELLIPTA ) 100-62.5-25 MCG/ACT AEPB Inhale 1 puff into the lungs daily. 1 each 11   Probiotic Product (PROBIOTIC PEARLS WOMENS PO) Take 1 capsule by mouth daily.     estradiol  (ESTRACE  VAGINAL) 0.1 MG/GM vaginal cream Insert 1 g vaginally nightly for 7 nights, then 1-2 times weekly as maintenance. 42.5 g 1   Ivermectin  1 % CREA Apply topically. (Patient not taking: Reported on 12/30/2023)     meloxicam  (MOBIC ) 15 MG tablet Take 1 tablet (15 mg total) by mouth daily. (Patient not taking: Reported on 12/30/2023) 30 tablet 0   gabapentin  (NEURONTIN ) 100 MG capsule Take 1 capsule (100 mg total) by mouth at bedtime. 30 capsule 1   No facility-administered medications prior to visit.      ROS:  Review of Systems  Constitutional:  Negative for fever.   Gastrointestinal:  Positive for constipation. Negative for blood in stool, diarrhea, nausea and vomiting.  Genitourinary:  Positive for frequency, urgency, vaginal bleeding and vaginal discharge. Negative for dyspareunia, dysuria, flank pain, hematuria and vaginal pain.  Musculoskeletal:  Negative for back pain.  Skin:  Negative for rash.   BREAST: No symptoms   OBJECTIVE:   Vitals:  BP 100/66   Pulse 61   Ht 5' 4 (1.626 m)   Wt 146 lb (66.2 kg)   LMP 01/08/2017 (Exact Date)   BMI 25.06 kg/m   Physical Exam Vitals reviewed.  Constitutional:      Appearance: She is well-developed.  Pulmonary:     Effort: Pulmonary effort is normal.  Genitourinary:    Pubic Area: No rash.      Labia:        Right: Rash present. No tenderness or lesion.        Left: Rash present. No tenderness or lesion.      Vagina: Vaginal discharge present. No erythema, tenderness or bleeding.      Comments: WHITE CREAMY VAG D/C ON EXAM; NO BLEEDING/BLOOD; MOD VAG ATROPHY ON EXAM Musculoskeletal:        General: Normal range of motion.     Cervical back: Normal range of motion.  Skin:    General: Skin is warm and dry.  Neurological:     General: No focal deficit present.     Mental Status: She is alert and oriented to person, place, and time.  Psychiatric:        Mood and Affect: Mood normal.        Behavior: Behavior normal.        Thought Content: Thought content normal.        Judgment: Judgment normal.     Results: Results for orders placed or performed in visit on 12/30/23 (from the past 24 hours)  POCT Urinalysis Dipstick     Status: Abnormal   Collection Time: 12/30/23  4:49 PM  Result Value Ref Range   Color, UA yellow    Clarity, UA     Glucose, UA Negative Negative   Bilirubin, UA neg    Ketones, UA neg    Spec Grav, UA 1.010 1.010 - 1.025   Blood, UA trace    pH, UA 6.0 5.0 - 8.0   Protein,  UA Negative Negative   Urobilinogen, UA     Nitrite, UA neg    Leukocytes, UA  Canul (1+) (A) Negative   Appearance     Odor    POCT Wet Prep with KOH     Status: Normal   Collection Time: 12/30/23  4:50 PM  Result Value Ref Range   Trichomonas, UA Negative    Clue Cells Wet Prep HPF POC neg    Epithelial Wet Prep HPF POC     Yeast Wet Prep HPF POC neg    Bacteria Wet Prep HPF POC     RBC Wet Prep HPF POC     WBC Wet Prep HPF POC     KOH Prep POC Negative Negative     Assessment/Plan: PMB (postmenopausal bleeding) - Plan: estradiol  (ESTRACE  VAGINAL) 0.1 MG/GM vaginal cream; no blood on exam, looks like contusions at introitus with atrophy, question trauma from sex. Add vag ERT daily for 2 wks/abstinence. RTO in 2 wks for f/u. If lesions persist, will do bx.   Vaginal atrophy - Plan: estradiol  (ESTRACE  VAGINAL) 0.1 MG/GM vaginal cream  Dyspareunia in female--add vag ERT and lubricants.   Vaginal discharge - Plan: POCT Wet Prep with KOH; white d/c on exam, neg wet prep. Looks normal. F/u in 2 wks.   Urinary urgency - Plan: POCT Urinalysis Dipstick, Urine Culture; questionable UA, check C&S. Will f/u with results. If neg,could be due to constipation vs will discuss OAB tx.   Bloating--with constipation. Add fiber daily, lots of water. F/u in 2 wks. If sx persist, will check GYN u/s.     Meds ordered this encounter  Medications   estradiol  (ESTRACE  VAGINAL) 0.1 MG/GM vaginal cream    Sig: Insert 1 g vaginally nightly for 2 weeks, then once wkly as maintenance    Dispense:  42.5 g    Refill:  1    Supervising Provider:   LEIGH SOBER [[id number]]      Return in about 2 weeks (around 01/13/2024) for PMB/bloating f/u.  Nakai Pollio B. Theola Cuellar, PA-C 12/30/2023 4:51 PM

## 2023-12-30 NOTE — Patient Instructions (Signed)
 I value your feedback and you entrusting [place] with your care. If you get a [place] patient survey, I would appreciate you taking the time to let us know about your experience today. Thank you! ? ? ?

## 2024-01-01 LAB — URINE CULTURE

## 2024-01-02 ENCOUNTER — Ambulatory Visit: Payer: Self-pay | Admitting: Obstetrics and Gynecology

## 2024-01-04 ENCOUNTER — Encounter

## 2024-01-10 NOTE — Telephone Encounter (Signed)
 Pls cancel her u/s appt. Thx.

## 2024-01-10 NOTE — Addendum Note (Signed)
 Addended by: WATT HILA B on: 01/10/2024 12:52 PM   Modules accepted: Orders

## 2024-01-11 ENCOUNTER — Ambulatory Visit
Admission: RE | Admit: 2024-01-11 | Discharge: 2024-01-11 | Disposition: A | Discharge status: Home / Self Care | Source: Ambulatory Visit | Attending: Obstetrics and Gynecology | Admitting: Obstetrics and Gynecology

## 2024-01-11 ENCOUNTER — Ambulatory Visit

## 2024-01-11 DIAGNOSIS — R140 Abdominal distension (gaseous): Secondary | ICD-10-CM | POA: Insufficient documentation

## 2024-01-13 ENCOUNTER — Ambulatory Visit (INDEPENDENT_AMBULATORY_CARE_PROVIDER_SITE_OTHER): Admitting: Obstetrics and Gynecology

## 2024-01-13 ENCOUNTER — Encounter

## 2024-01-13 ENCOUNTER — Other Ambulatory Visit (HOSPITAL_COMMUNITY)
Admission: RE | Admit: 2024-01-13 | Discharge: 2024-01-13 | Disposition: A | Discharge status: Home / Self Care | Source: Ambulatory Visit | Attending: Obstetrics and Gynecology | Admitting: Obstetrics and Gynecology

## 2024-01-13 ENCOUNTER — Encounter: Payer: Self-pay | Admitting: Obstetrics and Gynecology

## 2024-01-13 VITALS — BP 127/75 | HR 65 | Ht 64.0 in | Wt 146.0 lb

## 2024-01-13 DIAGNOSIS — N952 Postmenopausal atrophic vaginitis: Secondary | ICD-10-CM

## 2024-01-13 DIAGNOSIS — R140 Abdominal distension (gaseous): Secondary | ICD-10-CM | POA: Diagnosis not present

## 2024-01-13 DIAGNOSIS — L308 Other specified dermatitis: Secondary | ICD-10-CM

## 2024-01-13 DIAGNOSIS — N898 Other specified noninflammatory disorders of vagina: Secondary | ICD-10-CM | POA: Diagnosis present

## 2024-01-13 NOTE — Patient Instructions (Signed)
 I value your feedback and you entrusting [place] with your care. If you get a [place] patient survey, I would appreciate you taking the time to let us know about your experience today. Thank you! ? ? ?

## 2024-01-13 NOTE — Progress Notes (Signed)
 Tower, Laine LABOR, MD   Chief Complaint  Patient presents with   Follow-up    Still feeling bloated. Still having vaginal irritation/itching.    HPI:      [name] is a 53 y.o. [id number] whose LMP was Patient's last menstrual period was 01/08/2017 (exact date)., presents today for vaginal irritation/lesion f/u noted on exam for PMB 12/30/23. Pt is s/p hyst with neg vag cuff exam. Had several areas of vascular, red macules at introitus. Rxd estrace  vag crm in case due to atrophy but pt hasn't been using it. Noticed light blood with wiping today; occas has it in her underwear.  Pt had GYN u/s today due to her concern for ovar ca, awaiting results. Is taking probiotics; hx of constipation. Pt noticed RT groin pain for a few days last wk that was sharp; no change with activity. Sx resolved.   12/30/23 NOTE:  presents today for brown vag d/c with odor/irritation for the past 2-3 months. Wears pantyliner due to d/c; no sx today but had it yesterday. Had PMB in past with neg exam and thought to be related to vag ERT use, so not using vag ERT for many months and PMB has resumed. Neg pap/neg HPV DNA 3/25. S/p total lap hyst with salpingectomy 2018 with Dr. Lake for AUB/leio. No meds to treat vaginal itching, no recent abx use. No new soaps. Does have dyspareunia, not using lubricants. Last sexually active about 3 wks ago; but PMB started before then.  Also with urinary frequency with good flow, urgency with urge incont at times, nocturia; no dysuria/hematuria/pelvic pain/fevers. No caffeine use. Urine has mild odor.  Has been having persistent bloating past few months; hx of constipation. Taking probiotics but not fiber supp. Drinking water. No FH ovar ca.    Patient Active Problem List   Diagnosis Date Noted   Right upper quadrant abdominal mass 10/01/2023   Constipation 10/01/2023   Symptoms, such as flushing, sleeplessness, headache, lack of concentration, associated with the  menopause 07/23/2023   Dysuria 07/23/2023   Left flank pain 07/23/2023   Numbness and tingling in left hand 07/23/2023   Family history of osteoporosis in sister 07/12/2023   Encounter for screening for HIV 06/21/2023   Encounter for hepatitis C screening test for low risk patient 06/21/2023   Routine general medical examination at a health care facility 06/13/2023   Cerumen impaction 06/19/2022   SUI (stress urinary incontinence, female) 07/07/2021   Encounter for screening mammogram for breast cancer 04/30/2021   Asthma 11/12/2020   DOE (dyspnea on exertion) 07/09/2020   Allergic rhinitis 12/25/2019   Vaccine reaction 07/26/2019   Perimenopause 06/08/2018   Low HDL (under 40) 06/08/2018   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   Cyst of right kidney 06/04/2017   RUQ abdominal pain 05/31/2017   Elevated glucose level 05/22/2017   S/P laparoscopic hysterectomy 01/14/2017   Cough variant asthma 06/01/2016   Encounter for routine gynecological examination 05/23/2014   Burning sensation of mouth 09/12/2013   Elevated transaminase level 01/13/2011   Encounter for routine adult medical exam with abnormal findings 01/05/2011   KIDNEY STONE 03/27/2010   Atypical chest pain 07/18/2008   STRESS REACTION, ACUTE, WITH EMOTIONAL DISTURBANCE 09/13/2007   Headache 09/13/2007    Past Surgical History:  Procedure Laterality Date   ABDOMINAL HYSTERECTOMY     CHOLECYSTECTOMY  06/1994   COLONOSCOPY WITH PROPOFOL  N/A 07/12/2017   Procedure: COLONOSCOPY WITH PROPOFOL ;  Surgeon: Unk Corinn Skiff,  MD;  Location: [facility] END[facility];  Service: Gastroenterology;  Laterality: N/A;   CYSTOSCOPY N/A 01/14/2017   Procedure: CYSTOSCOPY;  Surgeon: Lake Read, MD;  Location: [facility] ORS;  Service: Gynecology;  Laterality: N/A;   ESOPHAGOGASTRODUODENOSCOPY (EGD) WITH PROPOFOL  N/A 07/12/2017   Procedure: ESOPHAGOGASTRODUODENOSCOPY (EGD) WITH PROPOFOL ;  Surgeon: Unk Corinn Skiff, MD;  Location: [facility]  ENDOSCOPY;  Service: Gastroenterology;  Laterality: N/A;   KIDNEY STONE SURGERY     LITHOTRIPSY  03/2006   TONSILECTOMY, ADENOIDECTOMY, BILATERAL MYRINGOTOMY AND TUBES     myringotomy tubes   TOTAL LAPAROSCOPIC HYSTERECTOMY WITH SALPINGECTOMY Bilateral 01/14/2017   Procedure: HYSTERECTOMY TOTAL LAPAROSCOPIC WITH SALPINGECTOMY;  Surgeon: Lake Read, MD;  Location: [facility] ORS;  Service: Gynecology;  Laterality: Bilateral;    Family History  Problem Relation Age of Onset   Migraines Mother    Osteopenia Sister    Osteoporosis Sister    Cancer Maternal Grandmother 37       brain tumor/breast cancer   Breast cancer Maternal Grandmother    Heart disease Maternal Grandfather        heart problems   Breast cancer Paternal Aunt 73       Contact   Colon cancer Neg Hx    Esophageal cancer Neg Hx    Liver disease Neg Hx    Pancreatic cancer Neg Hx    Stomach cancer Neg Hx    Inflammatory bowel disease Neg Hx     Social History   Socioeconomic History   Marital status: Married    Spouse name: Not on file   Number of children: 2   Years of education: Not on file   Highest education level: Not on file  Occupational History   Occupation: insurance    Employer: [employer]  Tobacco Use   Smoking status: Never   Smokeless tobacco: Never  Vaping Use   Vaping status: Never Used  Substance and Sexual Activity   Alcohol use: No    Alcohol/week: 0.0 standard drinks of alcohol   Drug use: No   Sexual activity: Yes    Birth control/protection: Surgical    Comment: Hysterectomy  Other Topics Concern   Not on file  Social History Narrative   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, she is an Marine scientist   Rare caffeine and never smoker no alcohol no drug or substance use   Social Drivers of Corporate investment banker Strain: Low Risk  (03/23/2019)   Received from [facility]   Overall Financial Resource Strain (CARDIA)    Difficulty of  Paying Living Expenses: Not hard at all  Food Insecurity: Not on file  Transportation Needs: No Transportation Needs (03/23/2019)   Received from [facility]   PRAPARE - Transportation    Lack of Transportation (Medical): No    Lack of Transportation (Non-Medical): No  Physical Activity: Insufficiently Active (03/02/2017)   Exercise Vital Sign    Days of Exercise per Week: 3 days    Minutes of Exercise per Session: 30 min  Stress: No Stress Concern Present (03/02/2017)   [employer] of Occupational Health - Occupational Stress Questionnaire    Feeling of Stress : Only a little  Social Connections: Moderately Integrated (03/02/2017)   Social Connection and Isolation Panel    Frequency of Communication with Friends and Family: More than three times a week    Frequency of Social Gatherings with Friends and Family: More than three times a week  Attends Religious Services: More than 4 times per year    Active Member of Clubs or Organizations: No    Attends Banker Meetings: Never    Marital Status: Married  Catering manager Violence: Not At Risk (03/02/2017)   Humiliation, Afraid, Rape, and Kick questionnaire    Fear of Current or Ex-Partner: No    Emotionally Abused: No    Physically Abused: No    Sexually Abused: No    Outpatient Medications Prior to Visit  Medication Sig Dispense Refill   albuterol  (VENTOLIN  HFA) 108 (90 Base) MCG/ACT inhaler Inhale 2 puffs into the lungs every 6 (six) hours as needed for wheezing or shortness of breath. 8 g 2   Cholecalciferol (D3-1000 PO) Take by mouth.     estradiol  (ESTRACE  VAGINAL) 0.1 MG/GM vaginal cream Insert 1 g vaginally nightly for 2 weeks, then once wkly as maintenance 42.5 g 1   Fluticasone -Umeclidin-Vilant (TRELEGY ELLIPTA ) 100-62.5-25 MCG/ACT AEPB Inhale 1 puff into the lungs daily. 1 each 11   Ivermectin  1 % CREA Apply topically.     Probiotic Product (PROBIOTIC PEARLS WOMENS PO) Take 1 capsule by mouth  daily.     meloxicam  (MOBIC ) 15 MG tablet Take 1 tablet (15 mg total) by mouth daily. (Patient not taking: Reported on 12/30/2023) 30 tablet 0   No facility-administered medications prior to visit.      ROS:  Review of Systems  Constitutional:  Negative for fever.  Gastrointestinal:  Positive for constipation. Negative for blood in stool, diarrhea, nausea and vomiting.  Genitourinary:  Positive for dyspareunia and vaginal bleeding. Negative for dysuria, flank pain, frequency, hematuria, urgency, vaginal discharge and vaginal pain.  Musculoskeletal:  Negative for back pain.  Skin:  Negative for rash.   BREAST: No symptoms   OBJECTIVE:   Vitals:  BP 127/75   Pulse 65   Ht 5' 4 (1.626 m)   Wt 146 lb (66.2 kg)   LMP 01/08/2017 (Exact Date)   BMI 25.06 kg/m   Physical Exam Vitals reviewed.  Constitutional:      Appearance: She is well-developed.  Pulmonary:     Effort: Pulmonary effort is normal.  Genitourinary:    Labia:        Right: No rash or lesion.        Left: No rash or lesion.    Musculoskeletal:        General: Normal range of motion.     Cervical back: Normal range of motion.  Skin:    General: Skin is warm and dry.  Neurological:     General: No focal deficit present.     Mental Status: She is alert and oriented to person, place, and time.     Cranial Nerves: No cranial nerve deficit.  Psychiatric:        Mood and Affect: Mood normal.        Behavior: Behavior normal.        Thought Content: Thought content normal.        Judgment: Judgment normal.    VULVAR BIOPSY NOTE The indications for vulvar biopsy (rule out neoplasia, establish lichen sclerosus diagnosis) were reviewed.   Risks of the biopsy including pain, bleeding, infection were discussed. The patient stated understanding and agreed to undergo procedure today. Consent was signed,  time out performed.   The patient's vulva was prepped with Betadine. 1% lidocaine  was injected into area of  concern. A 3 -mm punch biopsy was done, biopsy tissue was picked up  with sterile forceps and sterile scissors were used to excise the lesion.  Prouse bleeding was noted and hemostasis was achieved using silver nitrate sticks.  The patient tolerated the procedure well. Post-procedure instructions  (pelvic rest for one week) were given to the patient. The patient is to call with heavy bleeding, fever greater than 100.4, foul smelling vaginal discharge or other concerns.   Assessment/Plan: Vaginal lesion - Plan: Surgical pathology; bx done today. Will f/u with results. If benign, still recommend treating with vag ERT cream. Most likely source of PMB.   Vaginal atrophy  Bloating--awaiting Gyn u/s results. If WNL, sx are GI related.     Return if symptoms worsen or fail to improve.  Zoa Dowty B. Shaydon Lease, PA-C 01/13/2024 4:53 PM

## 2024-01-17 ENCOUNTER — Ambulatory Visit: Payer: Self-pay | Admitting: Obstetrics and Gynecology

## 2024-01-17 NOTE — Addendum Note (Signed)
 Addended by: WATT HILA B on: 01/17/2024 09:10 AM   Modules accepted: Orders

## 2024-01-19 ENCOUNTER — Ambulatory Visit
Admission: RE | Admit: 2024-01-19 | Discharge: 2024-01-19 | Disposition: A | Discharge status: Home / Self Care | Source: Ambulatory Visit | Attending: Obstetrics and Gynecology | Admitting: Obstetrics and Gynecology

## 2024-01-19 DIAGNOSIS — N838 Other noninflammatory disorders of ovary, fallopian tube and broad ligament: Secondary | ICD-10-CM | POA: Insufficient documentation

## 2024-01-19 MED ORDER — GADOBUTROL 1 MMOL/ML IV SOLN
6.0000 mL | Freq: Once | INTRAVENOUS | Status: AC | PRN
Start: 2024-01-19 — End: 2024-01-19
  Administered 2024-01-19: 6 mL via INTRAVENOUS

## 2024-01-20 ENCOUNTER — Encounter

## 2024-01-20 LAB — SURGICAL PATHOLOGY

## 2024-01-20 NOTE — Addendum Note (Signed)
 Addended by: WATT HILA B on: 01/20/2024 09:49 AM   Modules accepted: Orders

## 2024-01-21 ENCOUNTER — Ambulatory Visit: Payer: Self-pay | Admitting: Obstetrics and Gynecology

## 2024-01-21 MED ORDER — CLOBETASOL PROPIONATE 0.05 % EX OINT: TOPICAL_OINTMENT | CUTANEOUS | 0 refills | Status: AC

## 2024-01-21 NOTE — Addendum Note (Signed)
 Addended by: WATT HILA B on: 01/21/2024 09:59 AM   Modules accepted: Orders

## 2024-01-27 ENCOUNTER — Ambulatory Visit

## 2024-01-27 ENCOUNTER — Other Ambulatory Visit

## 2024-01-27 ENCOUNTER — Ambulatory Visit (INDEPENDENT_AMBULATORY_CARE_PROVIDER_SITE_OTHER): Admitting: *Deleted

## 2024-01-27 ENCOUNTER — Encounter

## 2024-01-27 DIAGNOSIS — Z23 Encounter for immunization: Secondary | ICD-10-CM

## 2024-01-27 NOTE — Progress Notes (Signed)
 Per orders of Dr. Arlyss Solian, injection of influenza vaccine given by Manuelita JAYSON Frost in right deltoid. Patient tolerated injection well.

## 2024-02-03 ENCOUNTER — Encounter

## 2024-02-10 ENCOUNTER — Encounter

## 2024-02-18 ENCOUNTER — Encounter: Payer: Self-pay | Admitting: Obstetrics and Gynecology

## 2024-02-18 ENCOUNTER — Telehealth: Payer: Self-pay | Admitting: Family Medicine

## 2024-02-18 ENCOUNTER — Ambulatory Visit (INDEPENDENT_AMBULATORY_CARE_PROVIDER_SITE_OTHER): Admitting: Obstetrics and Gynecology

## 2024-02-18 VITALS — BP 106/67 | HR 67 | Ht 64.0 in | Wt 146.0 lb

## 2024-02-18 DIAGNOSIS — N950 Postmenopausal bleeding: Secondary | ICD-10-CM | POA: Diagnosis not present

## 2024-02-18 DIAGNOSIS — N952 Postmenopausal atrophic vaginitis: Secondary | ICD-10-CM

## 2024-02-18 DIAGNOSIS — N898 Other specified noninflammatory disorders of vagina: Secondary | ICD-10-CM

## 2024-02-18 LAB — POCT WET PREP WITH KOH
Clue Cells Wet Prep HPF POC: NEGATIVE
KOH Prep POC: NEGATIVE
Trichomonas, UA: NEGATIVE
Yeast Wet Prep HPF POC: NEGATIVE

## 2024-02-18 NOTE — Progress Notes (Signed)
 Tower, Laine LABOR, MD   Chief Complaint  Patient presents with   Follow-up    Still getting irritation, abnormal odor.    HPI:      [name] is a 53 y.o. [id number] whose LMP was Patient's last menstrual period was 01/08/2017 (exact date)., presents today for PMB f/u and vaginal irritation f/u from 01/13/24 after neg bx and started clobetasol daily for 2 wks (4 wks ago). Also recommended pt start vag ERT. Pt has only noticed a tinge of brown d/c since last appt, never started vag ERT. Has increased vag d/c with itching, no fishy odor. Not sexually active due to pain/dryness.   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 Pt is s/p hyst with neg vag cuff exam. Had several areas of vascular, red macules at introitus. Rxd estrace  vag crm in case due to atrophy but pt hasn't been using it. Noticed light blood with wiping today; occas has it in her underwear.  Pt had GYN u/s today due to her concern for ovar ca, awaiting results. Is taking probiotics; hx of constipation. Pt noticed RT groin pain for a few days last wk that was sharp; no change with activity. Sx resolved.    12/30/23 NOTE:  presents today for brown vag d/c with odor/irritation for the past 2-3 months. Wears pantyliner due to d/c; no sx today but had it yesterday. Had PMB in past with neg exam and thought to be related to vag ERT use, so not using vag ERT for many months and PMB has resumed. Neg pap/neg HPV DNA 3/25. S/p total lap hyst with salpingectomy 2018 with Dr. Lake for AUB/leio  Patient Active Problem List   Diagnosis Date Noted   Right upper quadrant abdominal mass 10/01/2023   Constipation 10/01/2023   Symptoms, such as flushing, sleeplessness, headache, lack of concentration, associated with the menopause 07/23/2023   Dysuria 07/23/2023   Left flank pain 07/23/2023    Numbness and tingling in left hand 07/23/2023   Family history of osteoporosis in sister 07/12/2023   Encounter for screening for HIV 06/21/2023   Encounter for hepatitis C screening test for low risk patient 06/21/2023   Routine general medical examination at a health care facility 06/13/2023   Cerumen impaction 06/19/2022   SUI (stress urinary incontinence, female) 07/07/2021   Encounter for screening mammogram for breast cancer 04/30/2021   Asthma 11/12/2020   DOE (dyspnea on exertion) 07/09/2020   Allergic rhinitis 12/25/2019   Vaccine reaction 07/26/2019   Perimenopause 06/08/2018   Low HDL (under 40) 06/08/2018   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   Cyst of right kidney 06/04/2017   RUQ abdominal pain 05/31/2017   Elevated glucose level 05/22/2017   S/P laparoscopic hysterectomy 01/14/2017   Cough variant asthma 06/01/2016   Encounter for routine gynecological examination 05/23/2014   Burning sensation of mouth 09/12/2013   Elevated transaminase level 01/13/2011   Encounter for routine adult medical exam with abnormal findings 01/05/2011   KIDNEY STONE 03/27/2010   Atypical chest pain 07/18/2008   STRESS REACTION, ACUTE, WITH EMOTIONAL DISTURBANCE 09/13/2007   Headache 09/13/2007    Past Surgical History:  Procedure Laterality Date   ABDOMINAL HYSTERECTOMY     CHOLECYSTECTOMY  06/1994   COLONOSCOPY WITH PROPOFOL  N/A 07/12/2017   Procedure: COLONOSCOPY WITH PROPOFOL ;  Surgeon: Unk Corinn Skiff, MD;  Location: [facility] END[facility];  Service: Gastroenterology;  Laterality: N/A;   CYSTOSCOPY N/A 01/14/2017   Procedure: CYSTOSCOPY;  Surgeon: Lake Read, MD;  Location: [facility] OR[facility];  Service: Gynecology;  Laterality: N/A;   ESOPHAGOGASTRODUODENOSCOPY (EGD) WITH PROPOFOL  N/A 07/12/2017   Procedure: ESOPHAGOGASTRODUODENOSCOPY (EGD) WITH PROPOFOL ;  Surgeon: Unk Corinn Skiff, MD;  Location: [facility];  Service: Gastroenterology;  Laterality: N/A;   KIDNEY STONE  SURGERY     LITHOTRIPSY  03/2006   TONSILECTOMY, ADENOIDECTOMY, BILATERAL MYRINGOTOMY AND TUBES     myringotomy tubes   TOTAL LAPAROSCOPIC HYSTERECTOMY WITH SALPINGECTOMY Bilateral 01/14/2017   Procedure: HYSTERECTOMY TOTAL LAPAROSCOPIC WITH SALPINGECTOMY;  Surgeon: Lake Read, MD;  Location: [facility] OR[facility];  Service: Gynecology;  Laterality: Bilateral;    Family History  Problem Relation Age of Onset   Migraines Mother    Osteopenia Sister    Osteoporosis Sister    Cancer Maternal Grandmother 47       brain tumor/breast cancer   Breast cancer Maternal Grandmother    Heart disease Maternal Grandfather        heart problems   Breast cancer Paternal Aunt 71       Contact   Colon cancer Neg Hx    Esophageal cancer Neg Hx    Liver disease Neg Hx    Pancreatic cancer Neg Hx    Stomach cancer Neg Hx    Inflammatory bowel disease Neg Hx     Social History   Socioeconomic History   Marital status: Married    Spouse name: Not on file   Number of children: 2   Years of education: Not on file   Highest education level: Not on file  Occupational History   Occupation: insurance    Employer: [employer]  Tobacco Use   Smoking status: Never   Smokeless tobacco: Never  Vaping Use   Vaping status: Never Used  Substance and Sexual Activity   Alcohol use: No    Alcohol/week: 0.0 standard drinks of alcohol   Drug use: No   Sexual activity: Yes    Birth control/protection: Surgical    Comment: Hysterectomy  Other Topics Concern   Not on file  Social History Narrative   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, she is an marine scientist   Rare caffeine and never smoker no alcohol no drug or substance use   Social Drivers of Corporate Investment Banker Strain: Low Risk  (03/23/2019)   [facility]   Overall Financial Resource Strain (CARDIA)    Difficulty of Paying Living Expenses: Not hard at all  Food Insecurity: Not on file   Transportation Needs: No Transportation Needs (03/23/2019)   Received from [facility]   PRAPARE - Transportation    Lack of Transportation (Medical): No    Lack of Transportation (Non-Medical): No  Physical Activity: Insufficiently Active (03/02/2017)   Exercise Vital Sign    Days of Exercise per Week: 3 days    Minutes of Exercise per Session: 30 min  Stress: No Stress Concern Present (03/02/2017)   [employer] of Occupational Health - Occupational Stress Questionnaire    Feeling of Stress : Only a little  Social Connections: Moderately Integrated (03/02/2017)   Social Connection and Isolation Panel    Frequency of Communication with Friends and Family: More than three times a week    Frequency of Social Gatherings with Friends and Family: More than three times a week    Attends Religious Services: More than 4  times per year    Active Member of Clubs or Organizations: No    Attends Banker Meetings: Never    Marital Status: Married  Catering Manager Violence: Not At Risk (03/02/2017)   Humiliation, Afraid, Rape, and Kick questionnaire    Fear of Current or Ex-Partner: No    Emotionally Abused: No    Physically Abused: No    Sexually Abused: No    Outpatient Medications Prior to Visit  Medication Sig Dispense Refill   albuterol  (VENTOLIN  HFA) 108 (90 Base) MCG/ACT inhaler Inhale 2 puffs into the lungs every 6 (six) hours as needed for wheezing or shortness of breath. 8 g 2   Cholecalciferol (D3-1000 PO) Take by mouth.     clobetasol ointment (TEMOVATE) 0.05 % Apply pea-sized amount to affected areas BID for 2 wks 15 g 0   estradiol  (ESTRACE  VAGINAL) 0.1 MG/GM vaginal cream Insert 1 g vaginally nightly for 2 weeks, then once wkly as maintenance 42.5 g 1   Fluticasone -Umeclidin-Vilant (TRELEGY ELLIPTA ) 100-62.5-25 MCG/ACT AEPB Inhale 1 puff into the lungs daily. 1 each 11   Ivermectin  1 % CREA Apply topically.     Probiotic Product (PROBIOTIC PEARLS WOMENS  PO) Take 1 capsule by mouth daily.     clindamycin  (CLEOCIN  T) 1 % external solution Apply topically 2 (two) times daily as needed. (Patient not taking: Reported on 02/18/2024)     No facility-administered medications prior to visit.      ROS:  Review of Systems  Constitutional:  Negative for fever.  Gastrointestinal:  Negative for blood in stool, constipation, diarrhea, nausea and vomiting.  Genitourinary:  Positive for vaginal discharge. Negative for dyspareunia, dysuria, flank pain, frequency, hematuria, urgency, vaginal bleeding and vaginal pain.  Musculoskeletal:  Negative for back pain.  Skin:  Negative for rash.   BREAST: No symptoms   OBJECTIVE:   Vitals:  BP 106/67   Pulse 67   Ht 5' 4 (1.626 m)   Wt 146 lb (66.2 kg)   LMP 01/08/2017 (Exact Date)   BMI 25.06 kg/m   Physical Exam Vitals reviewed.  Constitutional:      Appearance: She is well-developed.  Pulmonary:     Effort: Pulmonary effort is normal.  Genitourinary:    Labia:        Right: No rash, tenderness or lesion.        Left: No rash, tenderness or lesion.      Vagina: Vaginal discharge present.     Comments: EXT PATCHES OF ERYTHEMA RESOLVED AFTER CLOBETASOL USE; HAS FEW POSTMENOPAUSAL PETECHIAE AROUND URETHRA AND ANT FOURCHETTE; MILD ATROPHY Musculoskeletal:        General: Normal range of motion.     Cervical back: Normal range of motion.  Skin:    General: Skin is warm and dry.  Neurological:     General: No focal deficit present.     Mental Status: She is alert and oriented to person, place, and time.     Cranial Nerves: No cranial nerve deficit.  Psychiatric:        Mood and Affect: Mood normal.        Behavior: Behavior normal.        Thought Content: Thought content normal.        Judgment: Judgment normal.     Results: Results for orders placed or performed in visit on 02/18/24 (from the past 24 hours)  POCT Wet Prep with KOH     Status: Normal   Collection Time:  02/18/24  4:18  PM  Result Value Ref Range   Trichomonas, UA Negative    Clue Cells Wet Prep HPF POC neg    Epithelial Wet Prep HPF POC     Yeast Wet Prep HPF POC neg    Bacteria Wet Prep HPF POC     RBC Wet Prep HPF POC     WBC Wet Prep HPF POC     KOH Prep POC Negative Negative     Assessment/Plan: Vaginal lesion--resolved after clobetasol use. F/u prn. Question cause of PMB vs atrophy  Vaginal atrophy--discussed pt's reservations with starting vag ERT and she understands there is not systemic absorption with it and benefits outweigh risk for GSM. She will start it.   PMB (postmenopausal bleeding)--will see if vag ERT vs resolution of vaginal lesion resolves sx. F/u prn.   Vaginal discharge - Plan: NuSwab Vaginitis (VG), POCT Wet Prep with KOH; neg wet prep, check culture. If neg, most likely due to atrophy. Pt understands.     Return if symptoms worsen or fail to improve.  Tanise Russman B. Jeorgia Helming, PA-C 02/18/2024 4:36 PM

## 2024-02-18 NOTE — Patient Instructions (Signed)
 I value your feedback and you entrusting [place] with your care. If you get a [place] patient survey, I would appreciate you taking the time to let us know about your experience today. Thank you! ? ? ?

## 2024-02-18 NOTE — Telephone Encounter (Signed)
 Copied from CRM #[id number]. Topic: Appointments - Scheduling Inquiry for Clinic >> Feb 18, 2024 10:36 AM Vena HERO wrote: Reason for CRM: Pt called derm to schedule an appt but they don't have anything until Jan 2026 so she was wondering if Dr Randeen could recommend anything. Pt has a spot on her nose since last week, thought it was a pimple but its now changing color around the spot and she isn't sure what it is. She is leaving to go out of town on Sunday and if Dr Randeen wants to schedule an appt she is willing when she comes back. Please call pt to advise

## 2024-02-18 NOTE — Telephone Encounter (Signed)
 Please schedule appointment when she returns  Thanks

## 2024-02-23 LAB — NUSWAB VAGINITIS (VG)
Candida albicans, NAA: NEGATIVE
Candida glabrata, NAA: NEGATIVE
Trich vag by NAA: NEGATIVE

## 2024-02-25 ENCOUNTER — Ambulatory Visit: Payer: Self-pay | Admitting: Obstetrics and Gynecology

## 2024-03-01 ENCOUNTER — Telehealth: Payer: Self-pay

## 2024-03-01 NOTE — Telephone Encounter (Signed)
 Copied from CRM #[id number]. Topic: Referral - Question >> Mar 01, 2024  8:33 AM Frederich PARAS wrote: Reason for CRM: pt has a red spot on her nose, she has been calling in about it. she just says its gotten bigger and its more red then before. Pt would like a biopsy done on it.Pt would like dr. Randeen nurse to reach out to her asap . Pt thought it was a pimple but its more noticeable and not going away. Pt callback# [phone]

## 2024-03-01 NOTE — Telephone Encounter (Signed)
 Pt needs an appt to get spot eval before proceeding with a plan/biopsy/referral, please schedule with any available provider

## 2024-03-02 NOTE — Telephone Encounter (Signed)
 Called pt and schedule appt

## 2024-03-03 ENCOUNTER — Encounter: Payer: Self-pay | Admitting: Pulmonary Disease

## 2024-03-03 ENCOUNTER — Ambulatory Visit: Admitting: Pulmonary Disease

## 2024-03-03 VITALS — BP 106/80 | HR 63 | Temp 97.7°F | Ht 64.0 in | Wt 146.6 lb

## 2024-03-03 DIAGNOSIS — J329 Chronic sinusitis, unspecified: Secondary | ICD-10-CM | POA: Diagnosis not present

## 2024-03-03 DIAGNOSIS — J45991 Cough variant asthma: Secondary | ICD-10-CM | POA: Diagnosis not present

## 2024-03-03 LAB — NITRIC OXIDE: Nitric Oxide: 10

## 2024-03-03 NOTE — Progress Notes (Signed)
 Subjective:    Patient ID: Courtney Pineda, female    DOB: [birth year], 53 y.o.   MRN: [id number]  Patient Care Team: Randeen Laine LABOR, MD as PCP - General  Chief Complaint  Patient presents with   Asthma    Occasional shortness of breath on exertion.     BACKGROUND/INTERVAL:Courtney Pineda is a 53 year old lifelong never smoker who presents for follow-up on the issue of chronic cough.  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  Last evaluated here on 16 June 2023.  No exacerbations since last visit.    HPI Discussed the use of AI scribe software for clinical note transcription with the patient, who gave verbal consent to proceed.  History of Present Illness   Courtney Pineda is a 53 year old female with asthma who presents for follow-up.  She experiences occasional coughing, but it is not as severe as it was previously.  She is currently using Trelegy Ellipta  at a dose of 100 micrograms.  She does not use albuterol  very frequently perhaps once or twice a month.  She received her flu vaccine last month at her primary care provider's office.   Overall doing well.  No fevers, chills or sweats.  No sputum production or hemoptysis.  No chest pain.  No orthopnea or paroxysmal nocturnal dyspnea.  No lower extremity edema.  No calf tenderness.    DATA 07/22/2020 PFTs: FEV1 1.96 L or 68% predicted, FVC 2.40 L or 66% predicted, FEV1/FVC 82%, mild decrease on lung volumes, diffusion capacity not studied, minimal obstructive airways disease. 09/20/2020 PFTs: FEV1 1.90 L or 66% predicted, FVC 2.40 L or 66% predicted, FEV1/FVC 79%, patient had difficulties with the study.  Subsequent methacholine  challenge showed airways reactivity consistent with asthma.  There is concomitant restriction as well however patient had difficulty following directions and this may affect lung volumes. 02/14/2021 CT chest high-resolution: Mild band like scarring on the bilateral lung bases, no  evidence of interstitial lung disease.  Air trapping on expiratory phase imaging suggestive of Kovacic airways disease. 11/05/2022 PFTs: FEV1 1.87 L or 66% predicted FVC 2.23 L or 62% predicted, FEV1/FVC 84%, there is bronchodilator response with regards to airways resistance.  Mildly reduced lung volumes, normal diffusion capacity.  Consistent with mild obstructive airways disease, reversible and probable mild restriction.  Review of Systems A 10 point review of systems was performed and it is as noted above otherwise negative.   Patient Active Problem List   Diagnosis Date Noted   Right upper quadrant abdominal mass 10/01/2023   Constipation 10/01/2023   Symptoms, such as flushing, sleeplessness, headache, lack of concentration, associated with the menopause 07/23/2023   Dysuria 07/23/2023   Left flank pain 07/23/2023   Numbness and tingling in left hand 07/23/2023   Family history of osteoporosis in sister 07/12/2023   Encounter for screening for HIV 06/21/2023   Encounter for hepatitis C screening test for low risk patient 06/21/2023   Routine general medical examination at a health care facility 06/13/2023   Cerumen impaction 06/19/2022   SUI (stress urinary incontinence, female) 07/07/2021   Encounter for screening mammogram for breast cancer 04/30/2021   Asthma 11/12/2020   DOE (dyspnea on exertion) 07/09/2020   Allergic rhinitis 12/25/2019   Vaccine reaction 07/26/2019   Perimenopause 06/08/2018   Low HDL (under 40) 06/08/2018   [place] intestinal bacterial overgrowth 02/09/2018   Cyst of right kidney 06/04/2017   RUQ abdominal pain 05/31/2017   Elevated glucose level 05/22/2017  S/P laparoscopic hysterectomy 01/14/2017   Cough variant asthma 06/01/2016   Encounter for routine gynecological examination 05/23/2014   Burning sensation of mouth 09/12/2013   Elevated transaminase level 01/13/2011   Encounter for routine adult medical exam with abnormal findings 01/05/2011    KIDNEY STONE 03/27/2010   Atypical chest pain 07/18/2008   STRESS REACTION, ACUTE, WITH EMOTIONAL DISTURBANCE 09/13/2007   Headache 09/13/2007    Social History   Tobacco Use   Smoking status: Never   Smokeless tobacco: Never  Substance Use Topics   Alcohol use: No    Alcohol/week: 0.0 standard drinks of alcohol    Allergies  Allergen Reactions   Amoxicillin Anaphylaxis   Bee Venom Anaphylaxis   Sulfa Antibiotics Anaphylaxis   Sulfonamide Derivatives Anaphylaxis    Lungs close up   Covid-19 (Mrna) Vaccine     J and J Local reaction    Penicillins Rash    Has patient had a PCN reaction causing immediate rash, facial/tongue/throat swelling, SOB or lightheadedness with hypotension: No Has patient had a PCN reaction causing severe rash involving mucus membranes or skin necrosis: No Has patient had a PCN reaction that required hospitalization: No Has patient had a PCN reaction occurring within the last 10 years: No If all of the above answers are NO, then may proceed with Cephalosporin use.     Current Meds  Medication Sig   albuterol  (VENTOLIN  HFA) 108 (90 Base) MCG/ACT inhaler Inhale 2 puffs into the lungs every 6 (six) hours as needed for wheezing or shortness of breath.   Cholecalciferol (D3-1000 PO) Take by mouth.   clindamycin  (CLEOCIN  T) 1 % external solution Apply topically 2 (two) times daily as needed.   clobetasol  ointment (TEMOVATE ) 0.05 % Apply pea-sized amount to affected areas BID for 2 wks   estradiol  (ESTRACE  VAGINAL) 0.1 MG/GM vaginal cream Insert 1 g vaginally nightly for 2 weeks, then once wkly as maintenance   Fluticasone -Umeclidin-Vilant (TRELEGY ELLIPTA ) 100-62.5-25 MCG/ACT AEPB Inhale 1 puff into the lungs daily.   Ivermectin  1 % CREA Apply topically.   Probiotic Product (PROBIOTIC PEARLS WOMENS PO) Take 1 capsule by mouth daily.    Immunization History  Administered Date(s) Administered   Influenza Split 01/13/2011, 02/02/2012   Influenza Whole  03/22/2002, 12/27/2009   Influenza, Seasonal, Injecte, Preservative Fre 01/21/2023, 01/27/2024   Influenza,inj,Quad PF,6+ Mos 01/20/2013, 01/26/2014, 01/25/2015, 02/12/2016, 01/27/2017, 01/06/2018, 01/05/2019, 01/17/2020, 01/17/2021, 01/30/2022   Janssen (J&J) SARS-COV-2 Vaccination 07/19/2019   PNEUMOCOCCAL CONJUGATE-20 06/21/2023   Td 06/15/2002   Tdap 03/14/2013, 06/21/2023        Objective:     Vitals:   03/03/24 1101  BP: 106/80  Pulse: 63  Temp: 97.7 F (36.5 C)  Height: 5' 4 (1.626 m)  Weight: 146 lb 9.6 oz (66.5 kg)  SpO2: 98%  TempSrc: Temporal  BMI (Calculated): 25.15     SpO2: 98 %  GENERAL: Well-developed, well-nourished woman, no acute distress fully ambulatory.  No conversational dyspnea. Intermittent dry cough. Nasal quality to the speech. HEAD: Normocephalic, atraumatic.  EYES: Pupils equal, round, reactive to light.  No scleral icterus.  Conjunctiva clear. MOUTH: Dentition intact, oral mucosa moist. NECK: Supple. No thyromegaly. Trachea midline. No JVD.  No adenopathy. PULMONARY: Slightly diminished bilaterally.  No adventitious sounds. CARDIOVASCULAR: S1 and S2. Regular rate and rhythm.  No rubs, murmurs or gallops heard. ABDOMEN: Benign. MUSCULOSKELETAL: No joint deformity, no clubbing, no edema.  NEUROLOGIC: No focal deficit, no gait disturbance, speech is fluent. SKIN: Intact,warm,dry. PSYCH: Mood and  behavior normal.  Lab Results  Component Value Date   NITRICOXIDE 10 03/03/2024  This result suggests low (<25) Type 2 (T2) airway inflammation indicating a low likelihood of active T2-driven airway inflammation; reduced probability of response to inhaled corticosteroids.  In a patient being treated for asthma it indicates good control.     Assessment & Plan:     ICD-10-CM   1. 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 She reports improvement in symptoms, although some cough persists. No acute exacerbations or significant changes in symptoms. - Continue Trelegy Ellipta  100 mcg daily - Scheduled follow-up in six months - Will set up pulmonary function tests for next visit      Advised if symptoms do not improve or worsen, to please contact office for sooner follow up or seek emergency care.    I spent 30 minutes of dedicated to the care of this patient on the date of this encounter to include pre-visit review of records, face-to-face time with the patient discussing conditions above, post visit ordering of testing, clinical documentation with the electronic health record, making appropriate referrals as documented, and communicating necessary findings to members of the patients care team.     C. Leita Sanders, MD Advanced Bronchoscopy P[facility]    *This note was generated using voice recognition software/Dragon and/or AI transcription program.  Despite best efforts to proofread, errors can occur which can change the meaning. Any transcriptional errors that result from this process are unintentional and may not be fully corrected at the time of dictation.

## 2024-03-03 NOTE — Patient Instructions (Signed)
 VISIT SUMMARY:  Today, you came in for a follow-up visit to check on your asthma. You mentioned that you still have occasional coughing, but it is not as severe as it was before.  YOUR PLAN:  -COUGH VARIANT ASTHMA: Cough variant asthma is a type of asthma where the main symptom is a dry, non-productive cough. Your asthma is well-controlled with your current medication, Trelegy Ellipta , and you have noticed an improvement in your symptoms. You should continue taking Trelegy Ellipta  at a dose of 100 micrograms daily. We will see you again in six months and will set up breathing tests for your next visit.  INSTRUCTIONS:  Please continue taking Trelegy Ellipta  100 mcg daily. We will see you again in six months for a follow-up visit, and we will set up breathing tests for that appointment.

## 2024-03-07 ENCOUNTER — Ambulatory Visit: Admitting: Family Medicine

## 2024-03-07 ENCOUNTER — Encounter: Payer: Self-pay | Admitting: Family Medicine

## 2024-03-07 VITALS — BP 118/74 | HR 56 | Temp 98.3°F | Ht 64.0 in | Wt 146.1 lb

## 2024-03-07 DIAGNOSIS — L989 Disorder of the skin and subcutaneous tissue, unspecified: Secondary | ICD-10-CM | POA: Diagnosis not present

## 2024-03-07 NOTE — Progress Notes (Signed)
 Subjective:    Patient ID: Courtney Pineda, female    DOB: Jul 24, 1970, 53 y.o.   MRN: [id number]  HPI  Wt Readings from Last 3 Encounters:  03/07/24 146 lb 2 oz (66.3 kg)  03/03/24 146 lb 9.6 oz (66.5 kg)  02/18/24 146 lb (66.2 kg)   25.08 kg/m  Vitals:   03/07/24 0802  BP: 118/74  Pulse: (!) 56  Temp: 98.3 F (36.8 C)  SpO2: 98%    Pt presents for skin spot/lesion on nose  Red spot on nose  2 weeks Thought it was a pimple  Not going away   Not itchy A little tender to touch at times  No drainage  No trauma   Derm does not have appointment until January  Has appointment in [facility]ology [facility]   She does use sun protection Does not use soap or cleanser on face, just water    Patient Active Problem List   Diagnosis Date Noted   Non-healing skin lesion of nose 03/07/2024   Right upper quadrant abdominal mass 10/01/2023   Constipation 10/01/2023   Symptoms, such as flushing, sleeplessness, headache, lack of concentration, associated with the menopause 07/23/2023   Dysuria 07/23/2023   Left flank pain 07/23/2023   Numbness and tingling in left hand 07/23/2023   Family history of osteoporosis in sister 07/12/2023   Encounter for screening for HIV 06/21/2023   Encounter for hepatitis C screening test for low risk patient 06/21/2023   Routine general medical examination at a health care facility 06/13/2023   Cerumen impaction 06/19/2022   SUI (stress urinary incontinence, female) 07/07/2021   Encounter for screening mammogram for breast cancer 04/30/2021   Asthma 11/12/2020   DOE (dyspnea on exertion) 07/09/2020   Allergic rhinitis 12/25/2019   Vaccine reaction 07/26/2019   Perimenopause 06/08/2018   Low HDL (under 40) 06/08/2018   [place] intestinal bacterial overgrowth 02/09/2018   Cyst of right kidney 06/04/2017   RUQ abdominal pain 05/31/2017   Elevated glucose level 05/22/2017   S/P laparoscopic hysterectomy 01/14/2017    Cough variant asthma 06/01/2016   Encounter for routine gynecological examination 05/23/2014   Burning sensation of mouth 09/12/2013   Elevated transaminase level 01/13/2011   Encounter for routine adult medical exam with abnormal findings 01/05/2011   KIDNEY STONE 03/27/2010   Atypical chest pain 07/18/2008   STRESS REACTION, ACUTE, WITH EMOTIONAL DISTURBANCE 09/13/2007   Headache 09/13/2007   Past Medical History:  Diagnosis Date   Anemia    Asthma    Chronic right upper quadrant pain    Dysplastic nevus 09/16/2015   left infra axillary   Dysplastic nevus 10/27/2017   left side   History of kidney stones    Hyperemesis gravidarum 1999   [place] intestinal bacterial overgrowth 02/09/2018   Past Surgical History:  Procedure Laterality Date   ABDOMINAL HYSTERECTOMY     CHOLECYSTECTOMY  06/1994   COLONOSCOPY WITH PROPOFOL  N/A 07/12/2017   Procedure: COLONOSCOPY WITH PROPOFOL ;  Surgeon: Unk Corinn Skiff, MD;  Location: [facility] END[facility];  Service: Gastroenterology;  Laterality: N/A;   CYSTOSCOPY N/A 01/14/2017   Procedure: CYSTOSCOPY;  Surgeon: Lake Read, MD;  Location: [facility] ORS;  Service: Gynecology;  Laterality: N/A;   ESOPHAGOGASTRODUODENOSCOPY (EGD) WITH PROPOFOL  N/A 07/12/2017   Procedure: ESOPHAGOGASTRODUODENOSCOPY (EGD) WITH PROPOFOL ;  Surgeon: Unk Corinn Skiff, MD;  Location: [facility] ENDOSCOPY;  Service: Gastroenterology;  Laterality: N/A;   KIDNEY STONE SURGERY     LITHOTRIPSY  03/2006   TONSILECTOMY, ADENOIDECTOMY,  BILATERAL MYRINGOTOMY AND TUBES     myringotomy tubes   TOTAL LAPAROSCOPIC HYSTERECTOMY WITH SALPINGECTOMY Bilateral 01/14/2017   Procedure: HYSTERECTOMY TOTAL LAPAROSCOPIC WITH SALPINGECTOMY;  Surgeon: Lake Read, MD;  Location: [facility] OR[facility];  Service: Gynecology;  Laterality: Bilateral;   Social History   Tobacco Use   Smoking status: Never   Smokeless tobacco: Never  Vaping Use   Vaping status: Never Used  Substance Use Topics   Alcohol use: No     Alcohol/week: 0.0 standard drinks of alcohol   Drug use: No   Family History  Problem Relation Age of Onset   Migraines Mother    Osteopenia Sister    Osteoporosis Sister    Cancer Maternal Grandmother 18       brain tumor/breast cancer   Breast cancer Maternal Grandmother    Heart disease Maternal Grandfather        heart problems   Breast cancer Paternal Aunt 75       Contact   Colon cancer Neg Hx    Esophageal cancer Neg Hx    Liver disease Neg Hx    Pancreatic cancer Neg Hx    Stomach cancer Neg Hx    Inflammatory bowel disease Neg Hx    Allergies  Allergen Reactions   Amoxicillin Anaphylaxis   Bee Venom Anaphylaxis   Sulfa Antibiotics Anaphylaxis   Sulfonamide Derivatives Anaphylaxis    Lungs close up   Covid-19 (Mrna) Vaccine     J and J Local reaction    Penicillins Rash    Has patient had a PCN reaction causing immediate rash, facial/tongue/throat swelling, SOB or lightheadedness with hypotension: No Has patient had a PCN reaction causing severe rash involving mucus membranes or skin necrosis: No Has patient had a PCN reaction that required hospitalization: No Has patient had a PCN reaction occurring within the last 10 years: No If all of the above answers are NO, then may proceed with Cephalosporin use.    Current Outpatient Medications on File Prior to Visit  Medication Sig Dispense Refill   albuterol  (VENTOLIN  HFA) 108 (90 Base) MCG/ACT inhaler Inhale 2 puffs into the lungs every 6 (six) hours as needed for wheezing or shortness of breath. 8 g 2   Cholecalciferol (D3-1000 PO) Take by mouth.     clindamycin  (CLEOCIN  T) 1 % external solution Apply topically 2 (two) times daily as needed.     clobetasol  ointment (TEMOVATE ) 0.05 % Apply pea-sized amount to affected areas BID for 2 wks 15 g 0   estradiol  (ESTRACE  VAGINAL) 0.1 MG/GM vaginal cream Insert 1 g vaginally nightly for 2 weeks, then once wkly as maintenance 42.5 g 1   Fluticasone -Umeclidin-Vilant  (TRELEGY ELLIPTA ) 100-62.5-25 MCG/ACT AEPB Inhale 1 puff into the lungs daily. 1 each 11   Ivermectin  1 % CREA Apply topically.     Probiotic Product (PROBIOTIC PEARLS WOMENS PO) Take 1 capsule by mouth daily.     No current facility-administered medications on file prior to visit.    Review of Systems  Constitutional:  Negative for fever.  HENT:  Negative for congestion, facial swelling and rhinorrhea.   Skin:        Lesion on nose       Objective:   Physical Exam Constitutional:      General: She is not in acute distress.    Appearance: Normal appearance. She is normal weight. She is not ill-appearing or diaphoretic.  HENT:     Nose: No congestion or rhinorrhea.  Mouth/Throat:     Mouth: Mucous membranes are moist.     Pharynx: Oropharynx is clear.  Eyes:     Conjunctiva/sclera: Conjunctivae normal.     Pupils: Pupils are equal, round, and reactive to light.  Cardiovascular:     Rate and Rhythm: Regular rhythm. Bradycardia present.  Musculoskeletal:     Cervical back: Neck supple.  Lymphadenopathy:     Cervical: No cervical adenopathy.  Skin:    General: Skin is warm and dry.     Coloration: Skin is not jaundiced or pale.     Findings: No bruising or rash.     Comments: 2 by 3 mm indurated area on right distal nose /near tip  Slight pink color, no scab or evidence of drainage Non tender  Firm and not mobile  Resembles early comedone/blemish    Some erythema of cheeks resembling rosacea    Neurological:     Mental Status: She is alert.           Assessment & Plan:   Problem List Items Addressed This Visit       Musculoskeletal and Integument   Non-healing skin lesion of nose - Primary   Right lateral nose near tip This resembles early comedone/ blemish , sebaceous hyperplasia ior cyst s in the differential  In setting of possible rosacea on cheeks  Not healing 2 weeks , non tender   Recommend clean with gentle cleanser and water (pt usually  does not use soap on face) , can try over the counter cortisone cream (scant amt) daily for 7 days max  Watch for enlargement/redness/pain or drainage   Recommend keeping dermatology appointment for January  Call back and Er precautions noted in detail today

## 2024-03-07 NOTE — Assessment & Plan Note (Signed)
 Right lateral nose near tip This resembles early comedone/ blemish , sebaceous hyperplasia ior cyst s in the differential  In setting of possible rosacea on cheeks  Not healing 2 weeks , non tender   Recommend clean with gentle cleanser and water (pt usually does not use soap on face) , can try over the counter cortisone cream (scant amt) daily for 7 days max  Watch for enlargement/redness/pain or drainage   Recommend keeping dermatology appointment for January  Call back and Er precautions noted in detail today

## 2024-03-07 NOTE — Patient Instructions (Signed)
 Keep the skin area clean and dry  Use dove or cetaphil cleanser is fine   Cortisone cream over the counter is ok - tiny bit daily for no more than a week may help shrink it down   Watch for increase in size , tenderness, redness   See dermatology as planned

## 2024-04-18 ENCOUNTER — Ambulatory Visit: Admitting: Pulmonary Disease

## 2024-04-24 ENCOUNTER — Other Ambulatory Visit: Payer: Self-pay | Admitting: Family Medicine

## 2024-04-24 ENCOUNTER — Telehealth: Payer: Self-pay | Admitting: Obstetrics and Gynecology

## 2024-04-24 DIAGNOSIS — Z1231 Encounter for screening mammogram for malignant neoplasm of breast: Secondary | ICD-10-CM

## 2024-04-24 NOTE — Telephone Encounter (Signed)
 Patient called stating that she is need another ultra sound.  I see that it was ordered, however it looks as if it was done.  Will you please check to see if she needs it sooner that 07/10/2024. Please advise.  Courtney Pineda

## 2024-04-25 NOTE — Telephone Encounter (Signed)
 There is a future order already in her chart for her u/s due in March. Pls use that one.

## 2024-05-18 ENCOUNTER — Telehealth: Payer: Self-pay | Admitting: *Deleted

## 2024-05-18 NOTE — Telephone Encounter (Signed)
 Please schedule fasting labs prior to CPE, PCP will place order closer to appt date. Thanks

## 2024-05-18 NOTE — Telephone Encounter (Signed)
 Copied from CRM [id number]. Topic: Clinical - Request for Lab/Test Order >> May 18, 2024 11:03 AM Mia F wrote: Reason for CRM: Pt is wanting to have labs done before her physical scheduled in March. Please place order and call pt to schedule lab appt if possible

## 2024-05-29 ENCOUNTER — Ambulatory Visit: Admitting: Advanced Practice Midwife

## 2024-06-15 ENCOUNTER — Other Ambulatory Visit

## 2024-06-21 ENCOUNTER — Encounter: Admitting: Family Medicine

## 2024-07-13 ENCOUNTER — Encounter

## 2024-08-15 ENCOUNTER — Ambulatory Visit: Admitting: Pulmonary Disease

## 2024-08-15 ENCOUNTER — Encounter
